# Patient Record
Sex: Female | Born: 1945 | Race: White | Hispanic: No | Marital: Married | Smoking: Never smoker
Health system: Southern US, Community
[De-identification: ages and names within clinical notes are randomized; demographics above are authoritative.]

## PROBLEM LIST (undated history)

## (undated) DIAGNOSIS — G8929 Other chronic pain: Secondary | ICD-10-CM

## (undated) DIAGNOSIS — Z8719 Personal history of other diseases of the digestive system: Secondary | ICD-10-CM

## (undated) DIAGNOSIS — I499 Cardiac arrhythmia, unspecified: Secondary | ICD-10-CM

## (undated) DIAGNOSIS — F419 Anxiety disorder, unspecified: Secondary | ICD-10-CM

## (undated) DIAGNOSIS — I10 Essential (primary) hypertension: Secondary | ICD-10-CM

## (undated) DIAGNOSIS — K219 Gastro-esophageal reflux disease without esophagitis: Secondary | ICD-10-CM

## (undated) DIAGNOSIS — M1990 Unspecified osteoarthritis, unspecified site: Secondary | ICD-10-CM

## (undated) DIAGNOSIS — C44319 Basal cell carcinoma of skin of other parts of face: Secondary | ICD-10-CM

## (undated) DIAGNOSIS — I509 Heart failure, unspecified: Secondary | ICD-10-CM

## (undated) DIAGNOSIS — M545 Low back pain, unspecified: Secondary | ICD-10-CM

## (undated) DIAGNOSIS — G43909 Migraine, unspecified, not intractable, without status migrainosus: Secondary | ICD-10-CM

## (undated) DIAGNOSIS — J189 Pneumonia, unspecified organism: Secondary | ICD-10-CM

## (undated) DIAGNOSIS — I4891 Unspecified atrial fibrillation: Secondary | ICD-10-CM

## (undated) HISTORY — PX: TEMPOROMANDIBULAR JOINT SURGERY: SHX35

## (undated) HISTORY — PX: TUBAL LIGATION: SHX77

## (undated) HISTORY — PX: BASAL CELL CARCINOMA EXCISION: SHX1214

## (undated) HISTORY — PX: JOINT REPLACEMENT: SHX530

## (undated) HISTORY — PX: OOPHORECTOMY: SHX86

## (undated) HISTORY — PX: SHOULDER ARTHROSCOPY W/ ROTATOR CUFF REPAIR: SHX2400

## (undated) HISTORY — PX: MOHS SURGERY: SUR867

## (undated) HISTORY — PX: DILATION AND CURETTAGE OF UTERUS: SHX78

## (undated) HISTORY — PX: TMJ ARTHROPLASTY: SHX1066

---

## 1989-02-19 HISTORY — PX: CHOLECYSTECTOMY: SHX55

## 1989-02-19 HISTORY — PX: VAGINAL HYSTERECTOMY: SUR661

## 2000-06-07 ENCOUNTER — Ambulatory Visit (HOSPITAL_COMMUNITY): Admission: RE | Admit: 2000-06-07 | Discharge: 2000-06-07 | Payer: Self-pay | Admitting: Gastroenterology

## 2000-06-07 ENCOUNTER — Encounter: Payer: Self-pay | Admitting: Gastroenterology

## 2000-11-17 ENCOUNTER — Other Ambulatory Visit: Admission: RE | Admit: 2000-11-17 | Discharge: 2000-11-17 | Payer: Self-pay | Admitting: Obstetrics and Gynecology

## 2001-05-02 ENCOUNTER — Other Ambulatory Visit: Admission: RE | Admit: 2001-05-02 | Discharge: 2001-05-02 | Payer: Self-pay | Admitting: Obstetrics & Gynecology

## 2001-06-15 ENCOUNTER — Encounter (INDEPENDENT_AMBULATORY_CARE_PROVIDER_SITE_OTHER): Payer: Self-pay | Admitting: Specialist

## 2001-06-15 ENCOUNTER — Inpatient Hospital Stay (HOSPITAL_COMMUNITY): Admission: RE | Admit: 2001-06-15 | Discharge: 2001-06-18 | Payer: Self-pay | Admitting: Obstetrics & Gynecology

## 2002-07-11 ENCOUNTER — Other Ambulatory Visit: Admission: RE | Admit: 2002-07-11 | Discharge: 2002-07-11 | Payer: Self-pay | Admitting: Obstetrics & Gynecology

## 2003-07-25 ENCOUNTER — Other Ambulatory Visit: Admission: RE | Admit: 2003-07-25 | Discharge: 2003-07-25 | Payer: Self-pay | Admitting: Obstetrics & Gynecology

## 2004-08-20 ENCOUNTER — Other Ambulatory Visit: Admission: RE | Admit: 2004-08-20 | Discharge: 2004-08-20 | Payer: Self-pay | Admitting: Obstetrics & Gynecology

## 2005-03-30 ENCOUNTER — Ambulatory Visit (HOSPITAL_COMMUNITY): Admission: RE | Admit: 2005-03-30 | Discharge: 2005-03-30 | Payer: Self-pay | Admitting: Gastroenterology

## 2005-09-29 ENCOUNTER — Other Ambulatory Visit: Admission: RE | Admit: 2005-09-29 | Discharge: 2005-09-29 | Payer: Self-pay | Admitting: Obstetrics & Gynecology

## 2007-05-26 ENCOUNTER — Encounter: Admission: RE | Admit: 2007-05-26 | Discharge: 2007-05-26 | Payer: Self-pay | Admitting: Orthopaedic Surgery

## 2007-06-22 HISTORY — PX: TOTAL HIP ARTHROPLASTY: SHX124

## 2008-03-12 ENCOUNTER — Inpatient Hospital Stay (HOSPITAL_COMMUNITY): Admission: RE | Admit: 2008-03-12 | Discharge: 2008-03-19 | Payer: Self-pay | Admitting: Orthopaedic Surgery

## 2008-03-18 ENCOUNTER — Ambulatory Visit: Payer: Self-pay | Admitting: Physical Medicine & Rehabilitation

## 2009-03-28 ENCOUNTER — Ambulatory Visit (HOSPITAL_COMMUNITY): Admission: RE | Admit: 2009-03-28 | Discharge: 2009-03-28 | Payer: Self-pay | Admitting: Orthopaedic Surgery

## 2009-05-22 ENCOUNTER — Encounter: Admission: RE | Admit: 2009-05-22 | Discharge: 2009-05-22 | Payer: Self-pay | Admitting: Orthopaedic Surgery

## 2009-06-21 HISTORY — PX: TOTAL HIP ARTHROPLASTY: SHX124

## 2009-06-21 HISTORY — PX: HERNIA REPAIR: SHX51

## 2009-10-22 ENCOUNTER — Encounter: Admission: RE | Admit: 2009-10-22 | Discharge: 2009-10-22 | Payer: Self-pay | Admitting: Internal Medicine

## 2010-01-29 ENCOUNTER — Inpatient Hospital Stay (HOSPITAL_COMMUNITY): Admission: RE | Admit: 2010-01-29 | Discharge: 2010-02-01 | Payer: Self-pay | Admitting: General Surgery

## 2010-04-07 ENCOUNTER — Inpatient Hospital Stay (HOSPITAL_COMMUNITY): Admission: RE | Admit: 2010-04-07 | Discharge: 2010-04-11 | Payer: Self-pay | Admitting: Orthopaedic Surgery

## 2010-09-02 LAB — CBC
HCT: 26.5 % — ABNORMAL LOW (ref 36.0–46.0)
HCT: 30 % — ABNORMAL LOW (ref 36.0–46.0)
MCH: 30.4 pg (ref 26.0–34.0)
MCHC: 33.6 g/dL (ref 30.0–36.0)
MCV: 91.7 fL (ref 78.0–100.0)
MCV: 91.7 fL (ref 78.0–100.0)
MCV: 91.8 fL (ref 78.0–100.0)
Platelets: 190 10*3/uL (ref 150–400)
RBC: 3.27 MIL/uL — ABNORMAL LOW (ref 3.87–5.11)
RDW: 13.6 % (ref 11.5–15.5)
RDW: 13.9 % (ref 11.5–15.5)
RDW: 14 % (ref 11.5–15.5)
WBC: 6.1 10*3/uL (ref 4.0–10.5)

## 2010-09-02 LAB — BASIC METABOLIC PANEL
BUN: 12 mg/dL (ref 6–23)
BUN: 23 mg/dL (ref 6–23)
CO2: 29 mEq/L (ref 19–32)
CO2: 31 mEq/L (ref 19–32)
Calcium: 8.2 mg/dL — ABNORMAL LOW (ref 8.4–10.5)
Chloride: 100 mEq/L (ref 96–112)
Creatinine, Ser: 0.61 mg/dL (ref 0.4–1.2)
Creatinine, Ser: 0.81 mg/dL (ref 0.4–1.2)
Creatinine, Ser: 1.01 mg/dL (ref 0.4–1.2)
GFR calc non Af Amer: >60 mL/min (ref >60)
Glucose, Bld: 117 mg/dL — ABNORMAL HIGH (ref 70–99)
Glucose, Bld: 119 mg/dL — ABNORMAL HIGH (ref 70–99)
Potassium: 4.1 mEq/L (ref 3.5–5.1)

## 2010-09-02 LAB — PROTIME-INR: INR: 1.94 — ABNORMAL HIGH (ref 0.00–1.49)

## 2010-09-03 LAB — COMPREHENSIVE METABOLIC PANEL
Alkaline Phosphatase: 82 U/L (ref 39–117)
BUN: 24 mg/dL — ABNORMAL HIGH (ref 6–23)
Chloride: 106 mEq/L (ref 96–112)
GFR calc non Af Amer: >60 mL/min (ref >60)
Glucose, Bld: 98 mg/dL (ref 70–99)
Potassium: 4.6 mEq/L (ref 3.5–5.1)
Total Bilirubin: 0.6 mg/dL (ref 0.3–1.2)
Total Protein: 6.9 g/dL (ref 6.0–8.3)

## 2010-09-03 LAB — SURGICAL PCR SCREEN: MRSA, PCR: NEGATIVE

## 2010-09-03 LAB — CBC
HCT: 35.1 % — ABNORMAL LOW (ref 36.0–46.0)
Hemoglobin: 12.2 g/dL (ref 12.0–15.0)
MCHC: 34.8 g/dL (ref 30.0–36.0)

## 2010-09-04 LAB — COMPREHENSIVE METABOLIC PANEL
ALT: 21 U/L (ref 0–35)
AST: 21 U/L (ref 0–37)
Albumin: 4.1 g/dL (ref 3.5–5.2)
Calcium: 9.5 mg/dL (ref 8.4–10.5)
Sodium: 138 mEq/L (ref 135–145)
Total Protein: 7 g/dL (ref 6.0–8.3)

## 2010-09-04 LAB — CBC
MCH: 30.7 pg (ref 26.0–34.0)
MCHC: 34.1 g/dL (ref 30.0–36.0)
Platelets: 276 10*3/uL (ref 150–400)
RBC: 4.07 MIL/uL (ref 3.87–5.11)
RDW: 13.6 % (ref 11.5–15.5)

## 2010-09-04 LAB — DIFFERENTIAL
Eosinophils Relative: 3 % (ref 0–5)
Lymphocytes Relative: 19 % (ref 12–46)
Lymphs Abs: 1.2 10*3/uL (ref 0.7–4.0)
Monocytes Absolute: 0.4 10*3/uL (ref 0.1–1.0)
Neutro Abs: 4.6 10*3/uL (ref 1.7–7.7)

## 2010-11-03 NOTE — Discharge Summary (Signed)
Sierra [name], Sierra [name]                 ACCOUNT NO.:  [id number]   MEDICAL RECORD NO.:  [id number]          PATIENT TYPE:  INP   LOCATION:  5016                         FACILITY:  [facility]   PHYSICIAN:  Lubertha Basque. Dalldorf, M.D.DATE OF BIRTH:  1945-07-01   DATE OF ADMISSION:  03/12/2008  DATE OF DISCHARGE:  03/18/2008                               DISCHARGE SUMMARY   ADMITTING DIAGNOSES:  1. Left hip degenerative joint disease.  2. History of gastroesophageal reflux disease.  3. Hypertension.  4. HPL.   DISCHARGE DIAGNOSES:  1. Left hip degenerative joint disease.  2. History of gastroesophageal reflux disease.  3. Hypertension.  4. HPL.   OPERATIONS:  Left total hip replacement.   BRIEF HISTORY:  [name] is a 65 year old patient well known to our  practice with increasing left hip pain and trouble walking.  X-rays  revealed end-stage DJD.  We have discussed treatment options with her  and I proceeded with a total hip replacement.   PERTINENT LABORATORY AND X-RAY FINDINGS:  Hemoglobin 9.3, hematocrit  26.9, and WBC is 7.4.  Sodium 136, potassium 3.6, glucose 133, BUN 15,  and creatinine 0.75.  INR 2.0.   COURSE IN THE HOSPITAL:  The patient was admitted postoperatively and  placed on a variety of p.o. and IM analgesics for pain, IV Ancef 1 g q.8  h x3 doses used.  She was kept on her home medicines which would be  outlined in the next dictation.  Physical therapy to be touched on the  partial weightbearing and pharmacy for Coumadin, Lovenox, and Protocol.  Her vitals were stable throughout her hospital stay.  Dressing was  changed numerous times and wound was noted to be benign with some  infection or irritation.  Normal neurovascular status but she was  extremely slow working with physical therapy, had weakness, had some  nausea and some vomiting, difficulty keeping her weightbearing status as  touchdown to progress to a partial weightbearing and then she decided  that she should  be able go home.  We had ordered case management for  skilled nursing facility placement.   CONDITION ON DISCHARGE:  Improved.   FOLLOWUP:  We may change her dressing daily.  Should be on a low-sodium  heart-healthy diet, partial weightbearing.  Return to our office in 2  weeks for staple removal, [phone] and should remain on her medicines  which were Ambien 10 mg as needed for sleep; Coumadin for 4 weeks  regulated by pharmacy, INR between 2 and 3; Phenergan for nausea 25 mg  q.4-6 h. P.r.n.; multivitamin 1 a day; and Percocet 1-2 q.4-6 p.r.n. as  needed for pain.      Lindwood Qua, P.A.      [name], M.D.  Electronically Signed    MC/MEDQ  D:  03/17/2008  T:  03/18/2008  Job:  [id number]

## 2010-11-03 NOTE — Op Note (Signed)
NAMENATIVIDAD, SCHLOSSER                 ACCOUNT NO.:  [id number]   MEDICAL RECORD NO.:  [id number]          PATIENT TYPE:  INP   LOCATION:  5016                         FACILITY:  [facility]   PHYSICIAN:  Lubertha Basque. Dalldorf, M.D.DATE OF BIRTH:  12-06-1945   DATE OF PROCEDURE:  03/12/2008  DATE OF DISCHARGE:                               OPERATIVE REPORT   PREOPERATIVE DIAGNOSIS:  Left hip degenerative arthritis.   POSTOPERATIVE DIAGNOSIS:  Left hip degenerative arthritis.   PROCEDURE:  Left total hip replacement.   ANESTHESIA:  General.   ATTENDING SURGEON:  Lubertha Basque. Jerl Santos, MD   ASSISTANT:  Lindwood Qua, PA   INDICATIONS FOR PROCEDURE:  The patient is a 65 year old woman with a  long history of bilateral hip pain.  She has persisted with difficulty  despite oral anti-inflammatories and even injections to both hips.  She  has pain which limits her ability to rest and walk and she is offered a  hip replacement on the left which is her most painful side currently.  Informed operative consent was obtained after discussion of the possible  complications of reaction to anesthesia, infection, DVT, PE,  dislocation, and death.   SUMMARY OF FINDINGS AND PROCEDURE:  Under general anesthesia through a  standard posterior approach, a left total hip replacement was performed.  She had advanced degenerative change but excellent bone quality.  We  addressed her problem with an uncemented DePuy System using a size 2  high offset Summit stem with a 45 ASR head with a +5 neck.  We also used  a size 50 ASR cup.  Bryna Colander assisted throughout and was invaluable  to the completion of the case in that he helped position and retract  while I performed the procedure.  He also closed simultaneously to help  minimize OR time.   DESCRIPTION OF PROCEDURE:  The patient was taken to the operating suite  where general anesthetic was applied without difficulty.  She then  positioned on a lateral decubitus  position with the left hip up.  Hip  positioners were utilized, axillary roll was placed, and all bony  prominences were appropriately padded.  She was prepped and draped in  normal sterile fashion.  After the administration of IV Kefzol, a  posterior approach was taken to the left hip.  An incision was made with  dissection down through a moderate amount of adipose tissue to the IT  band and gluteus maximus fascia which were split longitudinally to  expose the short external rotators of hip which were tagged and  reflected.  All appropriate anti-infected measures were used including  the preoperative IV antibiotic, Betadine impregnated drape, and closed  hooded exhaust systems for each member of the surgical team.  A  posterior capsulectomy was performed and the hip was dislocated.  A  femoral neck cut was made just above the lesser trochanter.  The  acetabulum was fully exposed and some labral tissues were excised.  She  was reamed in a medial direction to the inside wall of the pelvis up to  a size 49 followed  by placement of size 50 ASR cup in appropriate  anteversion and tilt.  Attention was then turned toward the femur.  This  was reamed and was very tight.  We broached up to a size 2 which fit  very well.  Trial reduction was done with various components and she  seemed stable with all but her leg length seemed best with the +5 high  offset assembly.  The trial component was removed followed by placement  of a size 2 high offset Summit stem in appropriate anteversion.  We then  placed a +5 45 ASR head assembly on a dry stem.  The hip was again  reduced and was stable in the aforementioned positions.  The leg lengths  were judged to be roughly equal.  The wound was irrigated followed by  reapproximation of the short external rotators to the greater  trochanteric region with nonabsorbable suture.  IT band and gluteus  maximus fascia were reapproximated with #1 Vicryl in interrupted   fashion.  Subcutaneous reapproximation was carried out with 0 and 2-0  undyed Vicryl followed by skin closure with staples.  Adaptic was  applied followed by dry gauze tape.  Estimated blood loss and  intraoperative fluids were obtained from anesthesia records.   DISPOSITION:  The patient was extubated in the operating room and taken  to recovery room in stable addition.  She is to be admitted to the  Orthopedic Surgery Service for appropriate postop care to include  perioperative antibiotics and Coumadin plus Lovenox for DVT prophylaxis.      [name], M.D.  Electronically Signed     PGD/MEDQ  D:  03/12/2008  T:  03/12/2008  Job:  [id number]

## 2010-11-06 NOTE — Op Note (Signed)
[facility]  Patient:    Sierra [name], Sierra [name] Visit Number: [id number] MRN: [id number]          Service Type: GYN Location: 910A 9131 01 Attending Physician:  Minette Headland Dictated by:   Freddy Finner, M.D. Proc. Date: 06/15/01 Admit Date:  06/15/2001                             Operative Report  PREOPERATIVE DIAGNOSES:       1. Persistent cystic right adnexal mass.                               2. Cystourethrocele with stress urinary                                  incontinence.  POSTOPERATIVE DIAGNOSES:      1. Benign right ovarian cyst.                               2. Pelvic adhesions.                               3. Cystourethrocele with stress urinary                                  incontinence.  OPERATION:                    1. Exploratory laparotomy with bilateral                                  salpingo-oophorectomy and lysis of pelvic                                  adhesions.                               2. Burch suspension of bladder.  SURGEON:                      Freddy Finner, M.D.  ASSISTANT:                    Guy Sandifer. Arleta Creek, M.D.  ESTIMATED BLOOD LOSS:         Less than or equal to 100 cc.  COMPLICATIONS:                None.  INDICATIONS:                  Details of present illness recorded in admit note.  DESCRIPTION OF PROCEDURE:     The patient was admitted on the morning of surgery.  She was placed in PDS hose.  She was given a bolus of Cefotan IV. She was taken to the operating room and there placed under adequate general endotracheal anesthesia and placed in the dorsal recumbent position with the knees frog legged and supporting the knees at the knee  joint.  The abdomen, perineum, and vagina were prepped with Betadine scrub followed by Betadine solution.  Foley with 30 cc bulb and triple lumen was placed in the bladder.  Sterile drapes were applied.  Lower abdominal transverse incision was made  and carried sharply down to fascia.  Subcutaneous vessels were controlled with the Bovie.  The fascia was entered sharply and extended transversely to the extent of skin incision.  Rectus sheath was developed superiorly and inferiorly with blunt and sharp dissection.  Rectus muscles were divided in midline.  Peritoneum was entered and extended under direct visualization to the extent of skin incision.  Peritoneal washings were taken and submitted.  With sharp dissection, some omental adhesions in the pelvis were freed.  Dissection was also carried out with the Bovie to free omental adhesions and to assure hemostasis.  The right tube and ovary were elevated and identified.  The right ovary was cystically enlarge with a smooth external capsule.  There was no evidence of peritoneal stunning.  Tanja Port was used to grasp the tube and ovary, and the infundibulopelvic ligament crossclamped with a Heaney and divided sharply.  This pedicle was doubly ligated, free tied with 0 Monocryl, followed by suture ligature with 0 Monocryl.  The remaining pedicle holding the ovary was crossclamped with a Heany, and the tube and ovary were removed.  The remaining pedicle was free tied with 0 Monocryl. Coagulation between the pedicles was carried out for complete hemostasis.  The left tube and ovary were identified. The left ovary was small and atrophic. It was elevated with a Babcock, and then a single pedicle, the infundibulopelvic and broad ligament pedicle were crossclamped and tube and ovary sharply excised.  The pedicle was doubly ligated with free tie of 0 Monocryl followed by suture ligature of 0 Monocryl. Exploration of the upper abdomen revealed no apparent abnormalities.  The appendix was visualized and was normal.  There were adhesions in the omentum in the right upper quadrant which were not dissected consistent with previous cholecystectomy.  Irrigation was carried out.  Irrigating solution was  aspirated from the abdomen. Hemostasis was adequate.  All packs, needles, and instruments were removed. The retractor used during this procedure was an [place].  Moist packs were used to pack the intestinal contents out of the pelvis.  After removal of packs and instruments, the peritoneum was closed with a running 0 Monocryl Suture.  Space of Retzius was then carefully dissected.  Angle of the ureterovesical junction was identified with traction on 30 cc Foley.  Two sutures were placed, one approximately 1.5 cm lateral to the ureterovesical junction and one 1 cm further lateral.  Each of these was attached to Coopers ligament or periosteum of the symphysis.  This was performed on both sides.  Sutures were tied with adequate elevation of the ureterovesical neck.  Bladder was filled with irrigating solution and a Bonnano catheter placed through a separate stab wound into the bladder and anchored to the skin with 3-0 nylon suture. Bladder was evacuated and Foley catheter removed.  The abdominal incision was then closed with running 0 Monocryl.  Running 0 PDS was used to close the fascia from angle to midline on either side.  Skin was closed with skin staples and 1/4 inch Steri-Strips.  The patient tolerated the operative procedure well and was taken to the recovery room in good condition. Dictated by:   Freddy Finner, M.D. Attending Physician:  Minette Headland DD:  06/15/01 TD:  06/15/01 Job:  [id number]

## 2010-11-06 NOTE — Discharge Summary (Signed)
[facility]  Patient:    Sierra [name], Sierra [name] Visit Number: [id number] MRN: [id number]          Service Type: GYN Location: 910A 9131 01 Attending Physician:  Minette Headland Dictated by:   Freddy Finner, M.D. Admit Date:  06/15/2001 Discharge Date: 06/18/2001                             Discharge Summary  FINAL DIAGNOSIS:              1.  Benign serous cystadenoma of right ovary.                               2.  Benign findings of left ovary and fallopian                                   tube.                               3.  Benign right fallopian tube.                               4.  Cystourethrocele with stress urinary                                   incontinence.  OPERATIVE PROCEDURE:          1. Bilateral salpingo-oophorectomy.                               2. Burch suspension to bladder.  POSTOPERATIVE COMPLICATIONS:  None.  DISPOSITION:                  The patient was in satisfactory improved condition at the time of her discharge.  DISCHARGE MEDICATIONS:        She is to resume all of her preoperative medications listed in the H&P.  She is further given Percocet 5 mg to be taken as needed for postoperative pain.  She is to continue with her hormone replacement therapy.  FOLLOWUP:                     She is to see me in approximately two weeks for her first postoperative visit.  HISTORY OF PRESENT ILLNESS:   Details of the present illness, past history, family history, review of systems and physical examination are recorded in the admission note or the operative summary.  FAMILY HISTORY:               The patients family history is dramatically remarkable for an ovarian malignancy in her mother.  PERSONAL HISTORY:             Remarkable for a persistent ovarian cyst noted to have been present for 18 months and demonstrable on pelvic ultrasound.  PHYSICAL EXAMINATION:         Her physical examination was further  remarkable for a cystourethrocele, with known previous history of anterior/posterior vaginal repair.  PLAN:  The patient has requested surgical intervention and is admitted at this time for bilateral salpingo-oophorectomy and Burch suspension of the bladder.  LABORATORY DATA:              During this admission, normal prothrombin time, PTT, and INR on admission.  Her admission hemoglobin was 137, platelet count was normal.  Postoperative hemoglobin was 11.1, with a normal platelet count.  Admission urinalysis was normal.  HOSPITAL COURSE:              The patient was admitted on the morning of surgery.  She was treated perioperatively with IV antibiotic and ______ .  The above described procedure was accomplished without difficulty.  By the morning of the third postoperative day, the patient was voiding normally without a catheter.  She was having adequate bowel function.  She had remained afebrile throughout her hospital stay.  Her condition was considered to be satisfactory and ready for discharge. Dictated by:   Freddy Finner, M.D. Attending Physician:  Minette Headland DD:  06/27/01 TD:  06/27/01 Job: [id number]

## 2010-11-06 NOTE — Procedures (Signed)
[facility]  Patient:    Sierra [name], Sierra [name]                          MRN: [id number] Proc. Date: 06/07/00 Attending:  Verlin Grills, M.D.                           Procedure Report  PROCEDURE:  Esophagogastroduodenoscopy with Savary dilation.  ENDOSCOPIST:  Verlin Grills, M.D.  INDICATIONS:  [name] is a 65 year old white female with heartburn and solid food dysphagia.  On November 27, 1991, she underwent a barium swallow, upper GI x-ray series to evaluate her heartburn.  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  [name] experiences the sensation of solid food stopping in her mid esophagus.  She reports no odynophagia or weight loss.  [name] reviewed our esophagogastroduodenoscopy education film.  I reviewed with her the complications associated with esophagogastroduodenoscopy and Savary esophageal dilation including intestinal bleeding and intestinal perforation.  [name] has signed the operative permit.  MEDICATION ALLERGIES:  CODEINE and ERYTHROMYCIN.  CHRONIC MEDICATIONS:  1. Toprol XL.  2. Hytrin.  3. Hydrochlorothiazide.  4. Colestid.  5. Aciphex.  6. Estrogen.  7. Benadryl.  8. Multivitamin.  9. Vitamin C. 10. Topical medication for rosacea.  PAST MEDICAL HISTORY:  Chronic gastroesophageal reflux disease, remote cholecystectomy, vaginal hysterectomy with cystocele and rectocele repair. Tubal ligation.  Jaw surgery, foot surgery, hypertension.  ENDOSCOPIST:  Verlin Grills, M.D.  PREMEDICATIONS:  Demerol 100 mg, Versed 10 mg.  ENDOSCOPE:  Olympus gastroscope and the 16 mm Savary dilator.  DESCRIPTION OF PROCEDURE:  After obtaining informed consent, the patient was placed in the left lateral decubitus position on the fluoroscopy table.  I administered intravenous Demerol and intravenous Versed to achieve  conscious sedation for the procedure.  The patients blood pressure, oxygen, saturation and cardiac rhythm were monitored throughout the procedure and documented in the medical record.  The Olympus gastroscope was passed through the posterior hypopharynx into the proximal esophagus without difficulty.  The hypopharynx, pharynx and vocal cords appeared normal.  Esophagoscopy:  The proximal, mid and lower segments of the esophagus appeared normal.  Endoscopically, there is no evidence of the presence of Barretts, erosive esophagitis, esophageal mucosa scarring, esophageal obstruction  or esophageal ulceration.  Gastroscopy:  There is no evidence for hiatal hernia.  Retroflexed view of the gastric cardia and fundus was normal.  Endoscopic appearance of the gastric body, antrum and pylorus was normal.  Duodenoscopy:  The duodenal bulb and descending duodenum appeared normal.  Savary esophageal dilation:  The Savary dilator wire was passed through the endoscope and the guidewire advanced to the distal gastric antrum as confirmed endoscopically and fluoroscopically.  Under fluoroscopic guidance, the 15 mm Savary dilator passed without resistance.  Repeat esophagogastroduodenoscopy revealed no signs of an esophageal mucosal stricture following Savary esophageal dilation, and no evidence of gastric trauma to the guidewire.  ASSESSMENT:  Chronic gastroesophageal reflux disease associated with a normal esophagogastroduodenoscopy. DD:  06/07/00 TD:  06/08/00 Job: [id number]

## 2010-11-06 NOTE — Op Note (Signed)
NAMEMAKENIZE, MESSMAN NO.:  [id number]   MEDICAL RECORD NO.:  [id number]          PATIENT TYPE:  AMB   LOCATION:  ENDO                         FACILITY:  [facility]   PHYSICIAN:  Danise Edge, M.D.   DATE OF BIRTH:  Jan 30, 1946   DATE OF PROCEDURE:  03/30/2005  DATE OF DISCHARGE:                                 OPERATIVE REPORT   PROCEDURE:  Screening proctocolonoscopy to 70 cm.   INDICATIONS:  Ms. Sierra [name] is a 65 year old female born June 10, 1946. Sierra [name] is scheduled to undergo her first screening colonoscopy with  polypectomy to prevent colon cancer. She has undergone a hysterectomy,  bladder tacking surgery and cholecystectomy in the past. As a result of her  pelvic surgery, a complete colonoscopy was not performed due to colonic loop  formation.   ENDOSCOPIST:  Danise Edge, M.D.   PREMEDICATION:  Versed 9 mg, Demerol 70 mg.   DESCRIPTION OF PROCEDURE:  After obtaining informed consent, Sierra [name] was  placed in the left lateral decubitus position. I administered intravenous  Demerol and intravenous Versed to achieve conscious sedation for the  procedure. The patient's blood pressure, oxygen saturation and cardiac  rhythm were monitored throughout the procedure and documented in the medical  record.   Anal inspection and digital rectal exam were normal. The Olympus adjustable  pediatric colonoscope was introduced into the rectum and advanced to 70 cm  from the anal verge which was near the splenic flexure. Due to colonic loop  formation and pelvic adhesions, a complete colonoscopy could not be  performed. The patient was repositioned from the left lateral decubitus  position to the supine position and finally the right lateral decubitus  position in the hopes of completing a colonoscopy but despite repositioning  and applying external abdominal pressure, further advancement of the  colonoscope was not possible. Colonic preparation for the exam  today was  excellent.   RECTUM:  Normal. Retroflexed view of the distal rectum normal.  SIGMOID COLON:  Normal.  DESCENDING COLON:  Normal.   Splenic flexure, transverse colon, hepatic flexure, ascending colon, cecum  and ileocecal valve were not examined.   ASSESSMENT:  Normal flexible proctocolonoscopy to 70 cm. 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           ______________________________  Danise Edge, M.D.     MJ/MEDQ  D:  03/30/2005  T:  03/30/2005  Job:  [id number]   cc:   Theressa Millard, M.D.  Fax: [phone]

## 2011-03-10 ENCOUNTER — Other Ambulatory Visit (HOSPITAL_COMMUNITY): Payer: Self-pay | Admitting: Orthopaedic Surgery

## 2011-03-10 DIAGNOSIS — R531 Weakness: Secondary | ICD-10-CM

## 2011-03-19 ENCOUNTER — Other Ambulatory Visit (HOSPITAL_COMMUNITY): Payer: Self-pay

## 2011-03-19 ENCOUNTER — Ambulatory Visit (HOSPITAL_COMMUNITY)
Admission: RE | Admit: 2011-03-19 | Discharge: 2011-03-19 | Disposition: A | Discharge status: Home / Self Care | Payer: Medicare Other | Source: Ambulatory Visit | Attending: Orthopaedic Surgery | Admitting: Orthopaedic Surgery

## 2011-03-19 DIAGNOSIS — M6740 Ganglion, unspecified site: Secondary | ICD-10-CM | POA: Insufficient documentation

## 2011-03-19 DIAGNOSIS — R531 Weakness: Secondary | ICD-10-CM

## 2011-03-19 DIAGNOSIS — Z96649 Presence of unspecified artificial hip joint: Secondary | ICD-10-CM | POA: Insufficient documentation

## 2011-03-19 DIAGNOSIS — M25559 Pain in unspecified hip: Secondary | ICD-10-CM | POA: Insufficient documentation

## 2011-03-22 LAB — PROTIME-INR
INR: 1
INR: 1.3
INR: 1.8 — ABNORMAL HIGH
INR: 2.4 — ABNORMAL HIGH
Prothrombin Time: 13.7
Prothrombin Time: 16.1 — ABNORMAL HIGH
Prothrombin Time: 19.9 — ABNORMAL HIGH
Prothrombin Time: 21.7 — ABNORMAL HIGH

## 2011-03-22 LAB — CBC
HCT: 26.8 — ABNORMAL LOW
HCT: 29.1 — ABNORMAL LOW
HCT: 30.4 — ABNORMAL LOW
HCT: 38.9
Hemoglobin: 10.1 — ABNORMAL LOW
Hemoglobin: 13.7
Hemoglobin: 9.3 — ABNORMAL LOW
MCHC: 34.6
MCHC: 34.9
MCV: 91.8
MCV: 92.2
MCV: 93.5
Platelets: 161
Platelets: 164
Platelets: 201
RBC: 2.91 — ABNORMAL LOW
RBC: 3.11 — ABNORMAL LOW
RBC: 4.23
RDW: 12.4
RDW: 12.5
RDW: 12.6
RDW: 12.6
WBC: 7.4
WBC: 8.5

## 2011-03-22 LAB — BASIC METABOLIC PANEL
BUN: 24 — ABNORMAL HIGH
CO2: 33 — ABNORMAL HIGH
Calcium: 8.4
Chloride: 101
Creatinine, Ser: 0.71
Glucose, Bld: 125 — ABNORMAL HIGH
Glucose, Bld: 91
Potassium: 4.4
Sodium: 137

## 2011-03-22 LAB — BASIC METABOLIC PANEL WITH GFR
BUN: 15
BUN: 18
CO2: 32
CO2: 32
Calcium: 8.5
Calcium: 8.5
Chloride: 100
Chloride: 97
Creatinine, Ser: 0.61
Creatinine, Ser: 0.75
GFR calc non Af Amer: >60
GFR calc non Af Amer: >60
Glucose, Bld: 113 — ABNORMAL HIGH
Glucose, Bld: 133 — ABNORMAL HIGH
Potassium: 3.6
Potassium: 4.5
Sodium: 136
Sodium: 139

## 2011-09-10 DIAGNOSIS — I10 Essential (primary) hypertension: Secondary | ICD-10-CM | POA: Diagnosis not present

## 2011-09-10 DIAGNOSIS — Z1331 Encounter for screening for depression: Secondary | ICD-10-CM | POA: Diagnosis not present

## 2011-09-10 DIAGNOSIS — R1013 Epigastric pain: Secondary | ICD-10-CM | POA: Diagnosis not present

## 2011-09-10 DIAGNOSIS — R490 Dysphonia: Secondary | ICD-10-CM | POA: Diagnosis not present

## 2011-09-27 DIAGNOSIS — R490 Dysphonia: Secondary | ICD-10-CM | POA: Diagnosis not present

## 2011-09-27 DIAGNOSIS — K219 Gastro-esophageal reflux disease without esophagitis: Secondary | ICD-10-CM | POA: Diagnosis not present

## 2011-09-29 DIAGNOSIS — M722 Plantar fascial fibromatosis: Secondary | ICD-10-CM | POA: Diagnosis not present

## 2011-09-29 DIAGNOSIS — R490 Dysphonia: Secondary | ICD-10-CM | POA: Diagnosis not present

## 2011-09-29 DIAGNOSIS — M25559 Pain in unspecified hip: Secondary | ICD-10-CM | POA: Diagnosis not present

## 2011-10-07 DIAGNOSIS — H2510 Age-related nuclear cataract, unspecified eye: Secondary | ICD-10-CM | POA: Diagnosis not present

## 2011-10-22 DIAGNOSIS — L820 Inflamed seborrheic keratosis: Secondary | ICD-10-CM | POA: Diagnosis not present

## 2011-10-27 DIAGNOSIS — M722 Plantar fascial fibromatosis: Secondary | ICD-10-CM | POA: Diagnosis not present

## 2011-10-29 DIAGNOSIS — M47817 Spondylosis without myelopathy or radiculopathy, lumbosacral region: Secondary | ICD-10-CM | POA: Diagnosis not present

## 2011-11-08 DIAGNOSIS — Z1231 Encounter for screening mammogram for malignant neoplasm of breast: Secondary | ICD-10-CM | POA: Diagnosis not present

## 2011-11-09 DIAGNOSIS — M47817 Spondylosis without myelopathy or radiculopathy, lumbosacral region: Secondary | ICD-10-CM | POA: Diagnosis not present

## 2011-11-22 DIAGNOSIS — M722 Plantar fascial fibromatosis: Secondary | ICD-10-CM | POA: Diagnosis not present

## 2011-12-01 DIAGNOSIS — M47817 Spondylosis without myelopathy or radiculopathy, lumbosacral region: Secondary | ICD-10-CM | POA: Diagnosis not present

## 2012-01-12 DIAGNOSIS — R109 Unspecified abdominal pain: Secondary | ICD-10-CM | POA: Diagnosis not present

## 2012-01-12 DIAGNOSIS — K219 Gastro-esophageal reflux disease without esophagitis: Secondary | ICD-10-CM | POA: Diagnosis not present

## 2012-02-09 DIAGNOSIS — M25559 Pain in unspecified hip: Secondary | ICD-10-CM | POA: Diagnosis not present

## 2012-02-09 DIAGNOSIS — M722 Plantar fascial fibromatosis: Secondary | ICD-10-CM | POA: Diagnosis not present

## 2012-03-14 DIAGNOSIS — E7800 Pure hypercholesterolemia, unspecified: Secondary | ICD-10-CM | POA: Diagnosis not present

## 2012-03-14 DIAGNOSIS — K219 Gastro-esophageal reflux disease without esophagitis: Secondary | ICD-10-CM | POA: Diagnosis not present

## 2012-03-14 DIAGNOSIS — R7301 Impaired fasting glucose: Secondary | ICD-10-CM | POA: Diagnosis not present

## 2012-03-14 DIAGNOSIS — I10 Essential (primary) hypertension: Secondary | ICD-10-CM | POA: Diagnosis not present

## 2012-03-14 DIAGNOSIS — Z23 Encounter for immunization: Secondary | ICD-10-CM | POA: Diagnosis not present

## 2012-03-30 ENCOUNTER — Other Ambulatory Visit (HOSPITAL_COMMUNITY): Payer: Self-pay | Admitting: Orthopaedic Surgery

## 2012-03-30 DIAGNOSIS — Z96649 Presence of unspecified artificial hip joint: Secondary | ICD-10-CM

## 2012-04-11 DIAGNOSIS — M47817 Spondylosis without myelopathy or radiculopathy, lumbosacral region: Secondary | ICD-10-CM | POA: Diagnosis not present

## 2012-04-12 ENCOUNTER — Ambulatory Visit (HOSPITAL_COMMUNITY)
Admission: RE | Admit: 2012-04-12 | Discharge: 2012-04-12 | Payer: Medicare Other | Source: Ambulatory Visit | Attending: Orthopaedic Surgery | Admitting: Orthopaedic Surgery

## 2012-04-18 DIAGNOSIS — M47817 Spondylosis without myelopathy or radiculopathy, lumbosacral region: Secondary | ICD-10-CM | POA: Diagnosis not present

## 2012-05-05 DIAGNOSIS — M47817 Spondylosis without myelopathy or radiculopathy, lumbosacral region: Secondary | ICD-10-CM | POA: Diagnosis not present

## 2012-05-08 ENCOUNTER — Ambulatory Visit (HOSPITAL_COMMUNITY)
Admission: RE | Admit: 2012-05-08 | Discharge: 2012-05-08 | Disposition: A | Discharge status: Home / Self Care | Payer: Medicare Other | Source: Ambulatory Visit | Attending: Orthopaedic Surgery | Admitting: Orthopaedic Surgery

## 2012-05-08 DIAGNOSIS — Z96649 Presence of unspecified artificial hip joint: Secondary | ICD-10-CM | POA: Diagnosis not present

## 2012-05-08 DIAGNOSIS — Z471 Aftercare following joint replacement surgery: Secondary | ICD-10-CM | POA: Diagnosis not present

## 2012-05-17 DIAGNOSIS — M25559 Pain in unspecified hip: Secondary | ICD-10-CM | POA: Diagnosis not present

## 2012-05-17 DIAGNOSIS — M722 Plantar fascial fibromatosis: Secondary | ICD-10-CM | POA: Diagnosis not present

## 2012-05-23 DIAGNOSIS — M47817 Spondylosis without myelopathy or radiculopathy, lumbosacral region: Secondary | ICD-10-CM | POA: Diagnosis not present

## 2012-05-24 DIAGNOSIS — H109 Unspecified conjunctivitis: Secondary | ICD-10-CM | POA: Diagnosis not present

## 2012-05-24 DIAGNOSIS — H2510 Age-related nuclear cataract, unspecified eye: Secondary | ICD-10-CM | POA: Diagnosis not present

## 2012-06-06 DIAGNOSIS — M47817 Spondylosis without myelopathy or radiculopathy, lumbosacral region: Secondary | ICD-10-CM | POA: Diagnosis not present

## 2012-06-30 DIAGNOSIS — M545 Low back pain: Secondary | ICD-10-CM | POA: Diagnosis not present

## 2012-09-12 DIAGNOSIS — R7301 Impaired fasting glucose: Secondary | ICD-10-CM | POA: Diagnosis not present

## 2012-09-12 DIAGNOSIS — F411 Generalized anxiety disorder: Secondary | ICD-10-CM | POA: Diagnosis not present

## 2012-09-12 DIAGNOSIS — K219 Gastro-esophageal reflux disease without esophagitis: Secondary | ICD-10-CM | POA: Diagnosis not present

## 2012-09-12 DIAGNOSIS — I10 Essential (primary) hypertension: Secondary | ICD-10-CM | POA: Diagnosis not present

## 2012-12-14 DIAGNOSIS — I10 Essential (primary) hypertension: Secondary | ICD-10-CM | POA: Diagnosis not present

## 2012-12-14 DIAGNOSIS — F411 Generalized anxiety disorder: Secondary | ICD-10-CM | POA: Diagnosis not present

## 2012-12-20 DIAGNOSIS — L819 Disorder of pigmentation, unspecified: Secondary | ICD-10-CM | POA: Diagnosis not present

## 2012-12-20 DIAGNOSIS — D1801 Hemangioma of skin and subcutaneous tissue: Secondary | ICD-10-CM | POA: Diagnosis not present

## 2012-12-20 DIAGNOSIS — D235 Other benign neoplasm of skin of trunk: Secondary | ICD-10-CM | POA: Diagnosis not present

## 2013-03-01 DIAGNOSIS — F411 Generalized anxiety disorder: Secondary | ICD-10-CM | POA: Diagnosis not present

## 2013-03-01 DIAGNOSIS — I10 Essential (primary) hypertension: Secondary | ICD-10-CM | POA: Diagnosis not present

## 2013-03-01 DIAGNOSIS — L659 Nonscarring hair loss, unspecified: Secondary | ICD-10-CM | POA: Diagnosis not present

## 2013-03-07 DIAGNOSIS — Z124 Encounter for screening for malignant neoplasm of cervix: Secondary | ICD-10-CM | POA: Diagnosis not present

## 2013-03-07 DIAGNOSIS — Z01419 Encounter for gynecological examination (general) (routine) without abnormal findings: Secondary | ICD-10-CM | POA: Diagnosis not present

## 2013-03-07 DIAGNOSIS — N390 Urinary tract infection, site not specified: Secondary | ICD-10-CM | POA: Diagnosis not present

## 2013-03-23 DIAGNOSIS — Z1231 Encounter for screening mammogram for malignant neoplasm of breast: Secondary | ICD-10-CM | POA: Diagnosis not present

## 2013-04-23 DIAGNOSIS — Z23 Encounter for immunization: Secondary | ICD-10-CM | POA: Diagnosis not present

## 2013-04-25 DIAGNOSIS — L570 Actinic keratosis: Secondary | ICD-10-CM | POA: Diagnosis not present

## 2013-04-25 DIAGNOSIS — D485 Neoplasm of uncertain behavior of skin: Secondary | ICD-10-CM | POA: Diagnosis not present

## 2013-04-25 DIAGNOSIS — L259 Unspecified contact dermatitis, unspecified cause: Secondary | ICD-10-CM | POA: Diagnosis not present

## 2013-05-28 DIAGNOSIS — M25559 Pain in unspecified hip: Secondary | ICD-10-CM | POA: Diagnosis not present

## 2013-05-31 ENCOUNTER — Other Ambulatory Visit (HOSPITAL_COMMUNITY): Payer: Self-pay | Admitting: Orthopaedic Surgery

## 2013-05-31 DIAGNOSIS — M25552 Pain in left hip: Secondary | ICD-10-CM

## 2013-06-04 ENCOUNTER — Ambulatory Visit (HOSPITAL_COMMUNITY)
Admission: RE | Admit: 2013-06-04 | Discharge: 2013-06-04 | Disposition: A | Discharge status: Home / Self Care | Payer: Medicare Other | Source: Ambulatory Visit | Attending: Orthopaedic Surgery | Admitting: Orthopaedic Surgery

## 2013-06-04 DIAGNOSIS — M25552 Pain in left hip: Secondary | ICD-10-CM

## 2013-06-04 DIAGNOSIS — M25559 Pain in unspecified hip: Secondary | ICD-10-CM | POA: Diagnosis not present

## 2013-06-04 DIAGNOSIS — M799 Soft tissue disorder, unspecified: Secondary | ICD-10-CM | POA: Diagnosis not present

## 2013-06-06 DIAGNOSIS — M545 Low back pain: Secondary | ICD-10-CM | POA: Diagnosis not present

## 2013-06-06 DIAGNOSIS — M25559 Pain in unspecified hip: Secondary | ICD-10-CM | POA: Diagnosis not present

## 2013-06-15 DIAGNOSIS — M47817 Spondylosis without myelopathy or radiculopathy, lumbosacral region: Secondary | ICD-10-CM | POA: Diagnosis not present

## 2013-06-26 DIAGNOSIS — M25559 Pain in unspecified hip: Secondary | ICD-10-CM | POA: Diagnosis not present

## 2013-07-02 DIAGNOSIS — I10 Essential (primary) hypertension: Secondary | ICD-10-CM | POA: Diagnosis not present

## 2013-08-24 DIAGNOSIS — K141 Geographic tongue: Secondary | ICD-10-CM | POA: Diagnosis not present

## 2013-09-07 ENCOUNTER — Encounter (HOSPITAL_COMMUNITY): Payer: Self-pay

## 2013-09-10 ENCOUNTER — Other Ambulatory Visit: Payer: Self-pay | Admitting: Orthopedic Surgery

## 2013-09-11 ENCOUNTER — Ambulatory Visit (HOSPITAL_COMMUNITY)
Admission: RE | Admit: 2013-09-11 | Discharge: 2013-09-11 | Disposition: A | Discharge status: Home / Self Care | Payer: Medicare Other | Source: Ambulatory Visit | Attending: Orthopedic Surgery | Admitting: Orthopedic Surgery

## 2013-09-11 ENCOUNTER — Encounter (HOSPITAL_COMMUNITY): Payer: Self-pay

## 2013-09-11 ENCOUNTER — Encounter (HOSPITAL_COMMUNITY)
Admission: RE | Admit: 2013-09-11 | Discharge: 2013-09-11 | Disposition: A | Discharge status: Home / Self Care | Payer: Medicare Other | Source: Ambulatory Visit | Attending: Orthopedic Surgery | Admitting: Orthopedic Surgery

## 2013-09-11 DIAGNOSIS — Z01818 Encounter for other preprocedural examination: Secondary | ICD-10-CM | POA: Insufficient documentation

## 2013-09-11 DIAGNOSIS — I10 Essential (primary) hypertension: Secondary | ICD-10-CM | POA: Diagnosis not present

## 2013-09-11 HISTORY — DX: Gastro-esophageal reflux disease without esophagitis: K21.9

## 2013-09-11 HISTORY — DX: Personal history of other diseases of the digestive system: Z87.19

## 2013-09-11 HISTORY — DX: Unspecified osteoarthritis, unspecified site: M19.90

## 2013-09-11 HISTORY — DX: Essential (primary) hypertension: I10

## 2013-09-11 HISTORY — DX: Anxiety disorder, unspecified: F41.9

## 2013-09-11 LAB — URINALYSIS, ROUTINE W REFLEX MICROSCOPIC
BILIRUBIN URINE: NEGATIVE
Glucose, UA: NEGATIVE mg/dL
KETONES UR: NEGATIVE mg/dL
NITRITE: NEGATIVE
PROTEIN: NEGATIVE mg/dL
Specific Gravity, Urine: 1.023 (ref 1.005–1.030)
Urobilinogen, UA: 0.2 mg/dL (ref 0.0–1.0)
pH: 5 (ref 5.0–8.0)

## 2013-09-11 LAB — BASIC METABOLIC PANEL
BUN: 31 mg/dL — ABNORMAL HIGH (ref 6–23)
CO2: 27 mEq/L (ref 19–32)
Calcium: 9.8 mg/dL (ref 8.4–10.5)
Chloride: 102 mEq/L (ref 96–112)
Creatinine, Ser: 0.7 mg/dL (ref 0.50–1.10)
GFR, EST NON AFRICAN AMERICAN: 86 mL/min — AB (ref >90)
GLUCOSE: 103 mg/dL — AB (ref 70–99)
POTASSIUM: 3.9 meq/L (ref 3.7–5.3)
Sodium: 142 mEq/L (ref 137–147)

## 2013-09-11 LAB — CBC WITH DIFFERENTIAL/PLATELET
Basophils Absolute: 0.1 10*3/uL (ref 0.0–0.1)
Basophils Relative: 1 % (ref 0–1)
Eosinophils Absolute: 0.3 10*3/uL (ref 0.0–0.7)
Eosinophils Relative: 5 % (ref 0–5)
HCT: 38.4 % (ref 36.0–46.0)
HEMOGLOBIN: 13.1 g/dL (ref 12.0–15.0)
LYMPHS PCT: 15 % (ref 12–46)
Lymphs Abs: 1 10*3/uL (ref 0.7–4.0)
MCH: 31.1 pg (ref 26.0–34.0)
MCHC: 34.1 g/dL (ref 30.0–36.0)
MCV: 91.2 fL (ref 78.0–100.0)
MONOS PCT: 6 % (ref 3–12)
Monocytes Absolute: 0.4 10*3/uL (ref 0.1–1.0)
NEUTROS ABS: 4.5 10*3/uL (ref 1.7–7.7)
NEUTROS PCT: 73 % (ref 43–77)
PLATELETS: 251 10*3/uL (ref 150–400)
RBC: 4.21 MIL/uL (ref 3.87–5.11)
RDW: 13.6 % (ref 11.5–15.5)
WBC: 6.2 10*3/uL (ref 4.0–10.5)

## 2013-09-11 LAB — SURGICAL PCR SCREEN
MRSA, PCR: NEGATIVE
Staphylococcus aureus: NEGATIVE

## 2013-09-11 LAB — URINE MICROSCOPIC-ADD ON

## 2013-09-11 LAB — TYPE AND SCREEN
ABO/RH(D): A POS
Antibody Screen: NEGATIVE

## 2013-09-11 LAB — PROTIME-INR
INR: 0.96 (ref 0.00–1.49)
Prothrombin Time: 12.6 seconds (ref 11.6–15.2)

## 2013-09-11 LAB — APTT: APTT: 25 s (ref 24–37)

## 2013-09-11 LAB — ABO/RH: ABO/RH(D): A POS

## 2013-09-11 MED ORDER — CHLORHEXIDINE GLUCONATE 4 % EX LIQD: 60.0000 mL | Freq: Once | CUTANEOUS | Status: DC

## 2013-09-11 NOTE — Pre-Procedure Instructions (Signed)
Sierra [name]  09/11/2013   Your procedure is scheduled on:  Wednesday April 1 th at 1245 PM  Report to [facility] Short Stay Main Entrance"A" at 70 AM.  Call this number if you have problems the morning of surgery: [phone]   Remember:   Do not eat food or drink liquids after midnight Tuesday.   Take these medicines the morning of surgery with A SIP OF WATER: Citalopram, Gabapentin, Aciphex, and Amlodipine. Takes Metoprolol at night bedtime. Stop Aspirin, Nsaids,  Vitamins and Herbal medication 5 days prior to surgery.   Do not wear jewelry, make-up or nail polish.  Do not wear lotions, powders, or perfumes. You may not wear deodorant.  Do not shave 48 hours prior to surgery.   Do not bring valuables to the hospital.  [facility] is not responsible for any belongings or valuables.               Contacts, dentures or bridgework may not be worn into surgery.  Leave suitcase in the car. After surgery it may be brought to your room.  For patients admitted to the hospital, discharge time is determined by your treatment team.               Patients discharged the day of surgery will not be allowed to drive home.    Special Instructions: [name] - Preparing for Surgery  Before surgery, you can play an important role.  Because skin is not sterile, your skin needs to be as free of germs as possible.  You can reduce the number of germs on you skin by washing with CHG (chlorahexidine gluconate) soap before surgery.  CHG is an antiseptic cleaner which kills germs and bonds with the skin to continue killing germs even after washing.  Please DO NOT use if you have an allergy to CHG or antibacterial soaps.  If your skin becomes reddened/irritated stop using the CHG and inform your nurse when you arrive at Short Stay.  Do not shave (including legs and underarms) for at least 48 hours prior to the first CHG shower.  You may shave your face.  Please follow these instructions  carefully:   1.  Shower with CHG Soap the night before surgery and the                                morning of Surgery.  2.  If you choose to wash your hair, wash your hair first as usual with your       normal shampoo.  3.  After you shampoo, rinse your hair and body thoroughly to remove the                      Shampoo.  4.  Use CHG as you would any other liquid soap.  You can apply chg directly       to the skin and wash gently with scrungie or a clean washcloth.  5.  Apply the CHG Soap to your body ONLY FROM THE NECK DOWN.        Do not use on open wounds or open sores.  Avoid contact with your eyes,       ears, mouth and genitals (private parts).  Wash genitals (private parts)       with your normal soap.  6.  Wash thoroughly, paying special attention to the area where your surgery  will be performed.  7.  Thoroughly rinse your body with warm water from the neck down.  8.  DO NOT shower/wash with your normal soap after using and rinsing off       the CHG Soap.  9.  Pat yourself dry with a clean towel.            10.  Wear clean pajamas.            11.  Place clean sheets on your bed the night of your first shower and do not        sleep with pets.  Day of Surgery  Do not apply any lotions/deoderants the morning of surgery.  Please wear clean clothes to the hospital/surgery center.      Please read over the following fact sheets that you were given: Pain Booklet, Coughing and Deep Breathing, Blood Transfusion Information, MRSA Information and Surgical Site Infection Prevention

## 2013-09-13 NOTE — Progress Notes (Signed)
Anesthesia Chart Review:  Patient is a 68 year old female scheduled for revision of left THA on 09/19/13 by Dr. Mayer Camel.  History includes non-smoker, HTN, hiatal hernia, GERD, anxiety, skin cancer (BCC), migraines, TMJ arthroplasty, UHR, cholecystectomy, hysterectomy. PCP is listed as Dr. Nehemiah Settle.  EKG on 09/11/13 showed NSR, non-specific ST/T wave abnormality.    Preoperative CXR and labs noted. Voice message left with Sandi Raveling regarding abnormal UA results so she can have Dr. Mayer Camel or his PA review for pre-operative recommendations.  George Hugh [facility] Short Stay Center/Anesthesiology Phone [phone] 09/13/2013 11:08 AM

## 2013-09-18 MED ORDER — CEFAZOLIN SODIUM-DEXTROSE 2-3 GM-% IV SOLR
2.0000 g | INTRAVENOUS | Status: AC
  Administered 2013-09-19: 2 g via INTRAVENOUS
  Filled 2013-09-18: qty 50

## 2013-09-18 MED ORDER — DEXTROSE-NACL 5-0.45 % IV SOLN: INTRAVENOUS | Status: DC

## 2013-09-18 NOTE — H&P (Signed)
TOTAL HIP REVISION ADMISSION H&P  Patient is admitted for left revision total hip arthroplasty.  Subjective:  Chief Complaint: left hip pain  HPI: Sierra [name], 68 y.o. female, has a history of pain and functional disability in the left hip due to arthritis and patient has failed non-surgical conservative treatments for greater than 12 weeks to include NSAID's and/or analgesics, flexibility and strengthening excercises and activity modification. The indications for the revision total hip arthroplasty are bearing surface wear leading to  symptomatic synovitis.  Onset of symptoms was gradual starting 6 years ago with gradually worsening course since that time.  Prior procedures on the left hip include arthroplasty.  Patient currently rates pain in the left hip at 10 out of 10 with activity.  There is night pain, worsening of pain with activity and weight bearing, pain that interfers with activities of daily living and pain with passive range of motion. Patient has evidence of soft tissue mass by imaging studies.  This condition presents safety issues increasing the risk of falls.   There is no current active infection.  There are no active problems to display for this patient.  Past Medical History  Diagnosis Date  . Hypertension   . Anxiety   . GERD (gastroesophageal reflux disease)   . H/O hiatal hernia   . Arthritis   . Headache(784.0)     hx migraines  . Cancer     basal cell Ca left cheek    Past Surgical History  Procedure Laterality Date  . Abdominal hysterectomy  1990's  . Dilation and curettage of uterus    . Cholecystectomy  1990's  . Hernia repair  7829    umbilical  . Shoulder arthroscopy w/ rotator cuff repair Right   . Tmj arthroplasty    . Oophorectomy Bilateral   . Joint replacement Right 2009  . Joint replacement Left 2011    No prescriptions prior to admission   Allergies  Allergen Reactions  . Ace Inhibitors Cough  . Codeine Nausea Only  . Erythromycin     Stomach pain  . Iohexol      Code: HIVES, Desc: patient states develops hives w/ iv contrast/mms     History  Substance Use Topics  . Smoking status: Never Smoker   . Smokeless tobacco: Never Used  . Alcohol Use: Yes     Comment: occasionally    No family history on file.    Review of Systems  Constitutional: Negative.   HENT: Negative.   Eyes: Negative.   Respiratory: Negative.   Cardiovascular: Negative.   Gastrointestinal: Negative.   Genitourinary: Negative.   Musculoskeletal: Positive for joint pain.  Skin: Negative.   Neurological: Negative.   Endo/Heme/Allergies: Negative.   Psychiatric/Behavioral: Negative.     Objective:  Physical Exam  Constitutional: She is oriented to person, place, and time. She appears well-developed and well-nourished.  HENT:  Head: Normocephalic and atraumatic.  Eyes: Pupils are equal, round, and reactive to light.  Neck: Normal range of motion. Neck supple.  Cardiovascular: Intact distal pulses.   Respiratory: Effort normal.  Musculoskeletal: She exhibits tenderness.  Neurological: She is alert and oriented to person, place, and time.  Skin: Skin is warm and dry.  Psychiatric: She has a normal mood and affect. Her behavior is normal. Judgment and thought content normal.    Vital signs in last 24 hours:     Labs:   There is no height or weight on file to calculate BMI.  Imaging Review:  The most recent MRI scan is reviewed and does show what appears to be a soft tissue mass around the neck of the total hip stem slightly bigger than last year, extending out for about 1/2 cm around the neck.  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  The patient history, physical examination, clinical judgement of the provider and imaging studies are consistent with end stage degenerative joint disease of the left hip(s), previous total hip arthroplasty. Revision total hip  arthroplasty is deemed medically necessary. The treatment options including medical management, injection therapy, arthroscopy and arthroplasty were discussed at length. The risks and benefits of total hip arthroplasty were presented and reviewed. The risks due to aseptic loosening, infection, stiffness, dislocation/subluxation,  thromboembolic complications and other imponderables were discussed.  The patient acknowledged the explanation, agreed to proceed with the plan and consent was signed. Patient is being admitted for inpatient treatment for surgery, pain control, PT, OT, prophylactic antibiotics, VTE prophylaxis, progressive ambulation and ADL's and discharge planning. The patient is planning to be discharged to skilled nursing facility

## 2013-09-19 ENCOUNTER — Inpatient Hospital Stay (HOSPITAL_COMMUNITY): Payer: Medicare Other | Admitting: Anesthesiology

## 2013-09-19 ENCOUNTER — Inpatient Hospital Stay (HOSPITAL_COMMUNITY): Payer: Medicare Other

## 2013-09-19 ENCOUNTER — Encounter (HOSPITAL_COMMUNITY)
Admission: RE | Disposition: A | Discharge status: Home Health | Payer: Self-pay | Source: Ambulatory Visit | Attending: Orthopedic Surgery

## 2013-09-19 ENCOUNTER — Inpatient Hospital Stay (HOSPITAL_COMMUNITY)
Admission: RE | Admit: 2013-09-19 | Discharge: 2013-09-23 | DRG: 468 | Disposition: A | Discharge status: Home Health | Payer: Medicare Other | Source: Ambulatory Visit | Attending: Orthopedic Surgery | Admitting: Orthopedic Surgery

## 2013-09-19 ENCOUNTER — Encounter (HOSPITAL_COMMUNITY): Payer: Self-pay | Admitting: *Deleted

## 2013-09-19 ENCOUNTER — Encounter (HOSPITAL_COMMUNITY): Payer: Medicare Other | Admitting: Vascular Surgery

## 2013-09-19 DIAGNOSIS — R4182 Altered mental status, unspecified: Secondary | ICD-10-CM

## 2013-09-19 DIAGNOSIS — Y831 Surgical operation with implant of artificial internal device as the cause of abnormal reaction of the patient, or of later complication, without mention of misadventure at the time of the procedure: Secondary | ICD-10-CM | POA: Diagnosis present

## 2013-09-19 DIAGNOSIS — Z96649 Presence of unspecified artificial hip joint: Secondary | ICD-10-CM

## 2013-09-19 DIAGNOSIS — R531 Weakness: Secondary | ICD-10-CM

## 2013-09-19 DIAGNOSIS — K219 Gastro-esophageal reflux disease without esophagitis: Secondary | ICD-10-CM | POA: Diagnosis not present

## 2013-09-19 DIAGNOSIS — Z471 Aftercare following joint replacement surgery: Secondary | ICD-10-CM | POA: Diagnosis not present

## 2013-09-19 DIAGNOSIS — T84099A Other mechanical complication of unspecified internal joint prosthesis, initial encounter: Secondary | ICD-10-CM | POA: Diagnosis not present

## 2013-09-19 DIAGNOSIS — R5381 Other malaise: Secondary | ICD-10-CM | POA: Diagnosis not present

## 2013-09-19 DIAGNOSIS — I10 Essential (primary) hypertension: Secondary | ICD-10-CM | POA: Diagnosis present

## 2013-09-19 DIAGNOSIS — T8489XA Other specified complication of internal orthopedic prosthetic devices, implants and grafts, initial encounter: Secondary | ICD-10-CM | POA: Diagnosis not present

## 2013-09-19 DIAGNOSIS — M25559 Pain in unspecified hip: Secondary | ICD-10-CM | POA: Diagnosis not present

## 2013-09-19 DIAGNOSIS — T40605A Adverse effect of unspecified narcotics, initial encounter: Secondary | ICD-10-CM | POA: Diagnosis present

## 2013-09-19 DIAGNOSIS — Z79899 Other long term (current) drug therapy: Secondary | ICD-10-CM

## 2013-09-19 DIAGNOSIS — Z85828 Personal history of other malignant neoplasm of skin: Secondary | ICD-10-CM | POA: Diagnosis not present

## 2013-09-19 DIAGNOSIS — M25552 Pain in left hip: Secondary | ICD-10-CM

## 2013-09-19 DIAGNOSIS — T8484XA Pain due to internal orthopedic prosthetic devices, implants and grafts, initial encounter: Secondary | ICD-10-CM

## 2013-09-19 DIAGNOSIS — F411 Generalized anxiety disorder: Secondary | ICD-10-CM | POA: Diagnosis not present

## 2013-09-19 DIAGNOSIS — Z7982 Long term (current) use of aspirin: Secondary | ICD-10-CM

## 2013-09-19 DIAGNOSIS — R5383 Other fatigue: Secondary | ICD-10-CM | POA: Diagnosis not present

## 2013-09-19 HISTORY — DX: Migraine, unspecified, not intractable, without status migrainosus: G43.909

## 2013-09-19 HISTORY — DX: Basal cell carcinoma of skin of other parts of face: C44.319

## 2013-09-19 HISTORY — PX: TOTAL HIP REVISION: SHX763

## 2013-09-19 HISTORY — DX: Other chronic pain: G89.29

## 2013-09-19 HISTORY — PX: REVISION TOTAL HIP ARTHROPLASTY: SHX766

## 2013-09-19 HISTORY — DX: Pneumonia, unspecified organism: J18.9

## 2013-09-19 HISTORY — DX: Low back pain: M54.5

## 2013-09-19 HISTORY — DX: Low back pain, unspecified: M54.50

## 2013-09-19 SURGERY — TOTAL HIP REVISION
Anesthesia: General | Site: Hip | Laterality: Left

## 2013-09-19 MED ORDER — FENTANYL CITRATE 0.05 MG/ML IJ SOLN
25.0000 ug | INTRAMUSCULAR | Status: DC | PRN
  Administered 2013-09-19: 50 ug via INTRAVENOUS
  Administered 2013-09-19 (×2): 25 ug via INTRAVENOUS

## 2013-09-19 MED ORDER — ACETAMINOPHEN 325 MG PO TABS
650.0000 mg | ORAL_TABLET | Freq: Four times a day (QID) | ORAL | Status: DC | PRN
  Administered 2013-09-20 – 2013-09-22 (×3): 650 mg via ORAL
  Filled 2013-09-19 (×3): qty 2

## 2013-09-19 MED ORDER — FENTANYL CITRATE 0.05 MG/ML IJ SOLN
INTRAMUSCULAR | Status: AC
  Filled 2013-09-19: qty 5

## 2013-09-19 MED ORDER — TERAZOSIN HCL 5 MG PO CAPS
10.0000 mg | ORAL_CAPSULE | Freq: Every day | ORAL | Status: DC
  Administered 2013-09-19 – 2013-09-22 (×3): 10 mg via ORAL
  Filled 2013-09-19 (×5): qty 2

## 2013-09-19 MED ORDER — PROPOFOL 10 MG/ML IV BOLUS
INTRAVENOUS | Status: AC
  Filled 2013-09-19: qty 20

## 2013-09-19 MED ORDER — PROPOFOL 10 MG/ML IV BOLUS
INTRAVENOUS | Status: DC | PRN
  Administered 2013-09-19: 130 mg via INTRAVENOUS

## 2013-09-19 MED ORDER — CITALOPRAM HYDROBROMIDE 40 MG PO TABS
40.0000 mg | ORAL_TABLET | Freq: Every day | ORAL | Status: DC
  Administered 2013-09-20 – 2013-09-23 (×4): 40 mg via ORAL
  Filled 2013-09-19 (×4): qty 1

## 2013-09-19 MED ORDER — ONDANSETRON HCL 4 MG/2ML IJ SOLN: 4.0000 mg | Freq: Once | INTRAMUSCULAR | Status: DC | PRN

## 2013-09-19 MED ORDER — ONDANSETRON HCL 4 MG/2ML IJ SOLN
INTRAMUSCULAR | Status: AC
  Filled 2013-09-19: qty 2

## 2013-09-19 MED ORDER — DEXAMETHASONE SODIUM PHOSPHATE 4 MG/ML IJ SOLN
INTRAMUSCULAR | Status: DC | PRN
  Administered 2013-09-19: 4 mg via INTRAVENOUS

## 2013-09-19 MED ORDER — PHENOL 1.4 % MT LIQD: 1.0000 | OROMUCOSAL | Status: DC | PRN

## 2013-09-19 MED ORDER — SODIUM CHLORIDE 0.9 % IV SOLN
1000.0000 mg | INTRAVENOUS | Status: AC
  Administered 2013-09-19: 1000 mg via INTRAVENOUS
  Filled 2013-09-19: qty 10

## 2013-09-19 MED ORDER — METOCLOPRAMIDE HCL 10 MG PO TABS: 5.0000 mg | ORAL_TABLET | Freq: Three times a day (TID) | ORAL | Status: DC | PRN

## 2013-09-19 MED ORDER — PANTOPRAZOLE SODIUM 40 MG PO TBEC
40.0000 mg | DELAYED_RELEASE_TABLET | Freq: Every day | ORAL | Status: DC
  Administered 2013-09-20 – 2013-09-22 (×3): 40 mg via ORAL
  Filled 2013-09-19 (×2): qty 1

## 2013-09-19 MED ORDER — SENNOSIDES-DOCUSATE SODIUM 8.6-50 MG PO TABS: 1.0000 | ORAL_TABLET | Freq: Every evening | ORAL | Status: DC | PRN

## 2013-09-19 MED ORDER — NEOSTIGMINE METHYLSULFATE 1 MG/ML IJ SOLN
INTRAMUSCULAR | Status: DC | PRN
  Administered 2013-09-19: 3 mg via INTRAVENOUS

## 2013-09-19 MED ORDER — KCL IN DEXTROSE-NACL 20-5-0.45 MEQ/L-%-% IV SOLN
INTRAVENOUS | Status: DC
  Administered 2013-09-19: 125 mL/h via INTRAVENOUS
  Administered 2013-09-20: 01:00:00 via INTRAVENOUS
  Filled 2013-09-19 (×13): qty 1000

## 2013-09-19 MED ORDER — OXYCODONE HCL 5 MG PO TABS
5.0000 mg | ORAL_TABLET | ORAL | Status: DC | PRN
  Administered 2013-09-19: 5 mg via ORAL
  Administered 2013-09-19 – 2013-09-20 (×4): 10 mg via ORAL
  Filled 2013-09-19 (×4): qty 2

## 2013-09-19 MED ORDER — ONDANSETRON HCL 4 MG PO TABS: 4.0000 mg | ORAL_TABLET | Freq: Four times a day (QID) | ORAL | Status: DC | PRN

## 2013-09-19 MED ORDER — MIDAZOLAM HCL 2 MG/2ML IJ SOLN
INTRAMUSCULAR | Status: AC
  Filled 2013-09-19: qty 2

## 2013-09-19 MED ORDER — ONDANSETRON HCL 4 MG/2ML IJ SOLN
INTRAMUSCULAR | Status: DC | PRN
  Administered 2013-09-19: 4 mg via INTRAVENOUS

## 2013-09-19 MED ORDER — LIDOCAINE HCL (CARDIAC) 20 MG/ML IV SOLN
INTRAVENOUS | Status: DC | PRN
  Administered 2013-09-19: 40 mg via INTRAVENOUS

## 2013-09-19 MED ORDER — LACTATED RINGERS IV SOLN
INTRAVENOUS | Status: DC
  Administered 2013-09-19: 50 mL/h via INTRAVENOUS

## 2013-09-19 MED ORDER — BISACODYL 5 MG PO TBEC: 5.0000 mg | DELAYED_RELEASE_TABLET | Freq: Every day | ORAL | Status: DC | PRN

## 2013-09-19 MED ORDER — AMLODIPINE BESYLATE 10 MG PO TABS
10.0000 mg | ORAL_TABLET | Freq: Every day | ORAL | Status: DC
  Administered 2013-09-20 – 2013-09-23 (×3): 10 mg via ORAL
  Filled 2013-09-19 (×4): qty 1

## 2013-09-19 MED ORDER — ACETAMINOPHEN 650 MG RE SUPP: 650.0000 mg | Freq: Four times a day (QID) | RECTAL | Status: DC | PRN

## 2013-09-19 MED ORDER — FENTANYL CITRATE 0.05 MG/ML IJ SOLN
INTRAMUSCULAR | Status: AC
  Filled 2013-09-19: qty 2

## 2013-09-19 MED ORDER — GLYCOPYRROLATE 0.2 MG/ML IJ SOLN
INTRAMUSCULAR | Status: DC | PRN
  Administered 2013-09-19: 0.4 mg via INTRAVENOUS

## 2013-09-19 MED ORDER — LIDOCAINE HCL (CARDIAC) 20 MG/ML IV SOLN
INTRAVENOUS | Status: AC
  Filled 2013-09-19: qty 5

## 2013-09-19 MED ORDER — METHOCARBAMOL 500 MG PO TABS: 500.0000 mg | ORAL_TABLET | Freq: Two times a day (BID) | ORAL | Status: DC

## 2013-09-19 MED ORDER — HYDROCHLOROTHIAZIDE 25 MG PO TABS
25.0000 mg | ORAL_TABLET | Freq: Every day | ORAL | Status: DC
  Administered 2013-09-19 – 2013-09-23 (×5): 25 mg via ORAL
  Filled 2013-09-19 (×5): qty 1

## 2013-09-19 MED ORDER — METHOCARBAMOL 500 MG PO TABS
500.0000 mg | ORAL_TABLET | Freq: Four times a day (QID) | ORAL | Status: DC | PRN
  Administered 2013-09-19 – 2013-09-20 (×2): 500 mg via ORAL
  Filled 2013-09-19 (×3): qty 1

## 2013-09-19 MED ORDER — MENTHOL 3 MG MT LOZG: 1.0000 | LOZENGE | OROMUCOSAL | Status: DC | PRN

## 2013-09-19 MED ORDER — SODIUM CHLORIDE 0.9 % IR SOLN
Status: DC | PRN
  Administered 2013-09-19: 1000 mL

## 2013-09-19 MED ORDER — ASPIRIN EC 325 MG PO TBEC
325.0000 mg | DELAYED_RELEASE_TABLET | Freq: Every day | ORAL | Status: DC
  Administered 2013-09-20 – 2013-09-23 (×4): 325 mg via ORAL
  Filled 2013-09-19 (×5): qty 1

## 2013-09-19 MED ORDER — GABAPENTIN 600 MG PO TABS
600.0000 mg | ORAL_TABLET | Freq: Three times a day (TID) | ORAL | Status: DC
  Administered 2013-09-19 – 2013-09-23 (×11): 600 mg via ORAL
  Filled 2013-09-19 (×13): qty 1

## 2013-09-19 MED ORDER — ONDANSETRON HCL 4 MG/2ML IJ SOLN: 4.0000 mg | Freq: Four times a day (QID) | INTRAMUSCULAR | Status: DC | PRN

## 2013-09-19 MED ORDER — METOPROLOL SUCCINATE ER 50 MG PO TB24
50.0000 mg | ORAL_TABLET | Freq: Every day | ORAL | Status: DC
  Administered 2013-09-19 – 2013-09-22 (×3): 50 mg via ORAL
  Filled 2013-09-19 (×5): qty 1

## 2013-09-19 MED ORDER — ASPIRIN EC 325 MG PO TBEC: 325.0000 mg | DELAYED_RELEASE_TABLET | Freq: Two times a day (BID) | ORAL | Status: DC

## 2013-09-19 MED ORDER — FENTANYL CITRATE 0.05 MG/ML IJ SOLN
INTRAMUSCULAR | Status: DC | PRN
  Administered 2013-09-19: 50 ug via INTRAVENOUS
  Administered 2013-09-19: 100 ug via INTRAVENOUS
  Administered 2013-09-19: 50 ug via INTRAVENOUS

## 2013-09-19 MED ORDER — DIPHENHYDRAMINE HCL 12.5 MG/5ML PO ELIX: 12.5000 mg | ORAL_SOLUTION | ORAL | Status: DC | PRN

## 2013-09-19 MED ORDER — OXYCODONE HCL 5 MG PO TABS
ORAL_TABLET | ORAL | Status: AC
  Filled 2013-09-19: qty 1

## 2013-09-19 MED ORDER — METOCLOPRAMIDE HCL 5 MG/ML IJ SOLN: 5.0000 mg | Freq: Three times a day (TID) | INTRAMUSCULAR | Status: DC | PRN

## 2013-09-19 MED ORDER — IRBESARTAN 150 MG PO TABS
150.0000 mg | ORAL_TABLET | Freq: Every day | ORAL | Status: DC
  Administered 2013-09-19 – 2013-09-23 (×5): 150 mg via ORAL
  Filled 2013-09-19 (×5): qty 1

## 2013-09-19 MED ORDER — KCL IN DEXTROSE-NACL 20-5-0.45 MEQ/L-%-% IV SOLN
INTRAVENOUS | Status: AC
  Filled 2013-09-19: qty 1000

## 2013-09-19 MED ORDER — METHOCARBAMOL 100 MG/ML IJ SOLN
500.0000 mg | Freq: Four times a day (QID) | INTRAVENOUS | Status: DC | PRN
  Administered 2013-09-19: 500 mg via INTRAVENOUS
  Filled 2013-09-19: qty 5

## 2013-09-19 MED ORDER — MIDAZOLAM HCL 5 MG/5ML IJ SOLN
INTRAMUSCULAR | Status: DC | PRN
  Administered 2013-09-19: 2 mg via INTRAVENOUS

## 2013-09-19 MED ORDER — MAGNESIUM CITRATE PO SOLN: 1.0000 | Freq: Once | ORAL | Status: AC | PRN

## 2013-09-19 MED ORDER — BUPIVACAINE-EPINEPHRINE (PF) 0.5% -1:200000 IJ SOLN
INTRAMUSCULAR | Status: AC
  Filled 2013-09-19: qty 10

## 2013-09-19 MED ORDER — HYDROMORPHONE HCL PF 1 MG/ML IJ SOLN
1.0000 mg | INTRAMUSCULAR | Status: DC | PRN
  Administered 2013-09-19 (×2): 1 mg via INTRAVENOUS
  Filled 2013-09-19 (×2): qty 1

## 2013-09-19 MED ORDER — BUPIVACAINE-EPINEPHRINE 0.5% -1:200000 IJ SOLN
INTRAMUSCULAR | Status: DC | PRN
  Administered 2013-09-19: 20 mL

## 2013-09-19 MED ORDER — OXYCODONE-ACETAMINOPHEN 5-325 MG PO TABS: 1.0000 | ORAL_TABLET | ORAL | Status: DC | PRN

## 2013-09-19 MED ORDER — DOCUSATE SODIUM 100 MG PO CAPS
100.0000 mg | ORAL_CAPSULE | Freq: Two times a day (BID) | ORAL | Status: DC
  Administered 2013-09-19 – 2013-09-22 (×6): 100 mg via ORAL
  Filled 2013-09-19 (×8): qty 1

## 2013-09-19 MED ORDER — LACTATED RINGERS IV SOLN
INTRAVENOUS | Status: DC | PRN
  Administered 2013-09-19 (×2): via INTRAVENOUS

## 2013-09-19 MED ORDER — DEXAMETHASONE SODIUM PHOSPHATE 4 MG/ML IJ SOLN
INTRAMUSCULAR | Status: AC
  Filled 2013-09-19: qty 1

## 2013-09-19 MED ORDER — ROCURONIUM BROMIDE 100 MG/10ML IV SOLN
INTRAVENOUS | Status: DC | PRN
  Administered 2013-09-19: 40 mg via INTRAVENOUS

## 2013-09-19 SURGICAL SUPPLY — 67 items
BOWL SMART MIX CTS (DISPOSABLE) IMPLANT
BRUSH FEMORAL CANAL (MISCELLANEOUS) IMPLANT
CONT SPEC 4OZ CLIKSEAL STRL BL (MISCELLANEOUS) ×3 IMPLANT
COVER SURGICAL LIGHT HANDLE (MISCELLANEOUS) ×3 IMPLANT
DRAPE C-ARM 42X72 X-RAY (DRAPES) IMPLANT
DRAPE ORTHO SPLIT 77X108 STRL (DRAPES) ×3
DRAPE PROXIMA HALF (DRAPES) ×3 IMPLANT
DRAPE SURG ORHT 6 SPLT 77X108 (DRAPES) ×1 IMPLANT
DRAPE U-SHAPE 47X51 STRL (DRAPES) ×3 IMPLANT
DRILL BIT 7/64X5 (BIT) ×3 IMPLANT
DRSG AQUACEL AG ADV 3.5X10 (GAUZE/BANDAGES/DRESSINGS) ×3 IMPLANT
DRSG MEPILEX BORDER 4X8 (GAUZE/BANDAGES/DRESSINGS) ×3 IMPLANT
DURAPREP 26ML APPLICATOR (WOUND CARE) ×3 IMPLANT
ELECT BLADE 4.0 EZ CLEAN MEGAD (MISCELLANEOUS)
ELECT REM PT RETURN 9FT ADLT (ELECTROSURGICAL) ×3
ELECTRODE BLDE 4.0 EZ CLN MEGD (MISCELLANEOUS) IMPLANT
ELECTRODE REM PT RTRN 9FT ADLT (ELECTROSURGICAL) ×1 IMPLANT
ELIMINATOR HOLE APEX DEPUY (Hips) ×3 IMPLANT
EVACUATOR 1/8 PVC DRAIN (DRAIN) IMPLANT
GAUZE XEROFORM 5X9 LF (GAUZE/BANDAGES/DRESSINGS) ×3 IMPLANT
GLOVE BIO SURGEON STRL SZ7.5 (GLOVE) ×3 IMPLANT
GLOVE BIO SURGEON STRL SZ8.5 (GLOVE) ×6 IMPLANT
GLOVE BIOGEL PI IND STRL 8 (GLOVE) ×2 IMPLANT
GLOVE BIOGEL PI IND STRL 9 (GLOVE) ×1 IMPLANT
GLOVE BIOGEL PI INDICATOR 8 (GLOVE) ×4
GLOVE BIOGEL PI INDICATOR 9 (GLOVE) ×2
GOWN STRL REUS W/ TWL LRG LVL3 (GOWN DISPOSABLE) ×2 IMPLANT
GOWN STRL REUS W/ TWL XL LVL3 (GOWN DISPOSABLE) ×5 IMPLANT
GOWN STRL REUS W/TWL LRG LVL3 (GOWN DISPOSABLE) ×6
GOWN STRL REUS W/TWL XL LVL3 (GOWN DISPOSABLE) ×15
HANDPIECE INTERPULSE COAX TIP (DISPOSABLE)
HEAD CERAMIC 36 PLUS5 (Hips) ×3 IMPLANT
HEEL PROTECTOR  874200 (MISCELLANEOUS)
HEEL PROTECTOR 874200 (MISCELLANEOUS) IMPLANT
HOOD PEEL AWAY FACE SHEILD DIS (HOOD) ×6 IMPLANT
KIT BASIN OR (CUSTOM PROCEDURE TRAY) ×3 IMPLANT
KIT ROOM TURNOVER OR (KITS) ×3 IMPLANT
LINER MARATHON 4MM 10DEG 36X52 (Hips) ×3 IMPLANT
MANIFOLD NEPTUNE II (INSTRUMENTS) ×3 IMPLANT
NEEDLE 22X1 1/2 (OR ONLY) (NEEDLE) ×3 IMPLANT
NOZZLE PRISM 8.5MM (MISCELLANEOUS) IMPLANT
NS IRRIG 1000ML POUR BTL (IV SOLUTION) ×6 IMPLANT
PACK TOTAL JOINT (CUSTOM PROCEDURE TRAY) ×3 IMPLANT
PAD ARMBOARD 7.5X6 YLW CONV (MISCELLANEOUS) ×6 IMPLANT
PASSER SUT SWANSON 36MM LOOP (INSTRUMENTS) ×3 IMPLANT
PIN SECTOR W/GRIP ACE CUP 52MM (Hips) ×3 IMPLANT
PRESSURIZER FEMORAL UNIV (MISCELLANEOUS) IMPLANT
SCREW 6.5MMX25MM (Screw) ×3 IMPLANT
SET HNDPC FAN SPRY TIP SCT (DISPOSABLE) IMPLANT
SLEEVE SURGEON STRL (DRAPES) IMPLANT
SPONGE GAUZE 4X4 12PLY (GAUZE/BANDAGES/DRESSINGS) ×3 IMPLANT
SPONGE LAP 18X18 X RAY DECT (DISPOSABLE) IMPLANT
STAPLER VISISTAT 35W (STAPLE) ×3 IMPLANT
SUT ETHIBOND 2 V 37 (SUTURE) ×3 IMPLANT
SUT ETHILON 3 0 FSL (SUTURE) ×3 IMPLANT
SUT VIC AB 0 CTB1 27 (SUTURE) ×3 IMPLANT
SUT VIC AB 1 CTX 36 (SUTURE) ×3
SUT VIC AB 1 CTX36XBRD ANBCTR (SUTURE) ×1 IMPLANT
SUT VIC AB 2-0 CTB1 (SUTURE) ×3 IMPLANT
SYR 20ML ECCENTRIC (SYRINGE) ×3 IMPLANT
SYR CONTROL 10ML LL (SYRINGE) ×3 IMPLANT
TOWEL OR 17X24 6PK STRL BLUE (TOWEL DISPOSABLE) ×3 IMPLANT
TOWEL OR 17X26 10 PK STRL BLUE (TOWEL DISPOSABLE) ×3 IMPLANT
TOWER CARTRIDGE SMART MIX (DISPOSABLE) IMPLANT
TRAY FOLEY CATH 14FR (SET/KITS/TRAYS/PACK) IMPLANT
TUBE ANAEROBIC SPECIMEN COL (MISCELLANEOUS) IMPLANT
WATER STERILE IRR 1000ML POUR (IV SOLUTION) ×9 IMPLANT

## 2013-09-19 NOTE — Progress Notes (Signed)
Utilization review completed.  

## 2013-09-19 NOTE — Op Note (Signed)
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  Surgeon: Kathalene Frames. Mayer Camel M.D.  Assistant: Kerry Hough. Barton Dubois  (present throughout entire procedure and necessary for timely completion of the procedure)  Estimated blood loss: 400 cc  Fluid replacement: 1500 cc of crystalloid  Complications: None  Specimens: The ASR shell and head was sent to pathology using standard chain of custody procedure. We also sent Gram stain and cultures of the synovial fluid, pseudo tumor, although it did not grossly appear to be infected  Indications: Patient with an ASR on Pinnacle stem total hip, place and 2009, that did very well until a few months ago when he had increasing groin pain. The pain wakes him up at night and recently got to the point where he could no longer go walking on a regular basis. MRI scan showed fluid collection, but no bony destruction. Plain x-rays show no change in the position of the components and the stem appears to be well ingrown. Risks and benefits of revision surgery have been discussed and questions answered.  Procedure: Patient was identified by arm band receive preoperative IV antibiotics in the holding area at, and hospital. He was then taken to the operating room where the appropriate anesthetic monitors were attached and general endotracheal anesthesia induced with the patient in the supine position. She was then rolled into the R lateral decubitus position and fixed there with a mark 2 pelvic clamp. A Foley catheter was inserted and the limb prepped and draped in usual sterile fashion from the ankle to the hemipelvis. Time out procedure performed. Skin along the lateral hip and thigh infiltrated with 20 cc of 1/2% Marcaine and  epinephrine solution. We began the operation by recreating the old posterior lateral incision 18 cm in line through the skin and subcutaneous tissue down to the level of the IT band which was cut in line with the skin incision. 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 This was peeled off the greater trochanter entering the hip joint where caseous material was encountered and removed. We thinned out the posterior capsule removing the pseudotumor and reflected posteriorly.. We then remove scar tissue from around to the ASR cup and femoral stem trunnion, dislocated a total hip and removed the ASR head with a mallet and metal cylinder. We continued to remove scar tissue from around the acetabular component. Gram stain and culture specimens were sent from this tissue. We began loosening the cup by placing a 1/4 inch osteotome between the edge of the acetabular component and the bone. We then used the short 50 IInnomed curved osteotome around the edge of the cup and at that point it came out relatively easily indicating no and growth. Minimal bone was noticed on the ingrowth surface of the cup and fibrous tissue is then stripped from the acetabulum. We reamed up to a 51 mm basket reamer obtaining good coverage in all quadrants irrigated with normal saline solution. We then hammered into place a 52 mm Gryption cup with superior pole cluster, in 45 of abduction and 20 of anteversion. A single 25 mm dome screw was placed obtaining good compression. We then tested the stability of the Pinnacle stem with axial and  rotatory compression. It was stable. A central occluder was screwed into the acetabular shell followed by a 10 polyethylene liner index posterior and superior. A trial reduction was then performed with a +5 36 mm femoral head instability was noted to 90 of flexion 70 of internal rotation and in full extension the hip could not be dislocated  with external rotation. At this point a real +5 36 mm ceramic head was hammered into place, the hip reduced and irrigated with normal saline solution. The capsular flap was repaired back to the intertrochanteric crest through drill holes with #2 Ethibond suture. We then closed the IT band with running #1 Vicryl suture, the subcutaneous tissue with 0 and 2-0 undyed Vicryl suture, the skin was closed with running interlocking 3-0 nylon suture. A dressing of Mepilex was then applied, the patient was unclamped a rolled supine awakened extubated and taken to the recovery without difficulty.

## 2013-09-19 NOTE — Interval H&P Note (Signed)
History and Physical Interval Note:  09/19/2013 12:40 PM  Sierra [name]  has presented today for surgery, with the diagnosis of PAINFUL LEFT ASR RECALL TOTAL HIP ARTHROPLASTY  The various methods of treatment have been discussed with the patient and family. After consideration of risks, benefits and other options for treatment, the patient has consented to  Procedure(s): LEFT TOTAL HIP REVISION (Left) as a surgical intervention .  The patient's history has been reviewed, patient examined, no change in status, stable for surgery.  I have reviewed the patient's chart and labs.  Questions were answered to the patient's satisfaction.     Kerin Salen

## 2013-09-19 NOTE — Plan of Care (Signed)
Problem: Consults Goal: Diagnosis- Total Joint Replacement Revision Total Hip Left        

## 2013-09-19 NOTE — Discharge Instructions (Signed)
Total Hip Replacement °Care After °Refer to this sheet in the next few weeks. These instructions provide you with information on caring for yourself after your procedure. Your caregiver also may give you specific instructions. Your treatment has been planned according to the most current medical practices, but problems sometimes occur. Call your caregiver if you have any problems or questions after your procedure. °HOME CARE INSTRUCTIONS  °Your caregiver will give you specific precautions for certain types of movement. Additional instructions include: °· Take over-the-counter or prescription medicines for pain, discomfort, or fever only as directed by your caregiver. °· Take quick showers (3 5 min) rather than bathe until your caregiver tells you that you can take baths again. °· Avoid lifting until your caregiver instructs you otherwise. °· Use a raised toilet seat and avoid sitting in low chairs as instructed by your caregiver. °· Use crutches or a walker as instructed by your caregiver. °SEEK MEDICAL CARE IF: °· You have difficulty breathing. °· Your wound is red, swollen, or has become increasingly painful. °· You have pus draining from your wound. °· You have a bad smell coming from your wound. °· You have persistent bleeding from your wound. °· Your wound breaks open after sutures (stitches) or staples have been removed. °SEEK IMMEDIATE MEDICAL CARE IF:  °· You have a fever. °· You have a rash. °· You have pain or swelling in your calf or thigh. °· You have shortness of breath or chest pain. °MAKE SURE YOU: °· Understand these instructions. °· Will watch your condition. °· Will get help right away if you are not doing well or get worse. °Document Released: 12/25/2004 Document Revised: 12/07/2011 Document Reviewed: 07/25/2011 °[employer]® Patient Information ©2014 [employer]. ° °

## 2013-09-19 NOTE — Transfer of Care (Signed)
Immediate Anesthesia Transfer of Care Note  Patient: Sierra [name]  Procedure(s) Performed: Procedure(s): LEFT TOTAL HIP REVISION (Left)  Patient Location: PACU  Anesthesia Type:General  Level of Consciousness: awake, alert  and oriented  Airway & Oxygen Therapy: Patient Spontanous Breathing and Patient connected to nasal cannula oxygen  Post-op Assessment: Report given to PACU RN and Post -op Vital signs reviewed and stable  Post vital signs: Reviewed and stable  Complications: No apparent anesthesia complications

## 2013-09-19 NOTE — Anesthesia Preprocedure Evaluation (Signed)
Anesthesia Evaluation  Patient identified by MRN, date of birth, ID band Patient awake    Reviewed: Allergy & Precautions, H&P , NPO status , Patient's Chart, lab work & pertinent test results  Airway Mallampati: II TM Distance: >3 FB Neck ROM: Full    Dental  (+) Teeth Intact, Dental Advisory Given   Pulmonary  breath sounds clear to auscultation        Cardiovascular hypertension, Rhythm:Regular Rate:Normal     Neuro/Psych    GI/Hepatic   Endo/Other    Renal/GU      Musculoskeletal   Abdominal (+) - obese,   Peds  Hematology   Anesthesia Other Findings   Reproductive/Obstetrics                           Anesthesia Physical Anesthesia Plan  ASA: III  Anesthesia Plan: General   Post-op Pain Management:    Induction: Intravenous  Airway Management Planned: Oral ETT  Additional Equipment:   Intra-op Plan:   Post-operative Plan: Extubation in OR  Informed Consent: I have reviewed the patients History and Physical, chart, labs and discussed the procedure including the risks, benefits and alternatives for the proposed anesthesia with the patient or authorized representative who has indicated his/her understanding and acceptance.   Dental advisory given  Plan Discussed with: CRNA and Anesthesiologist  Anesthesia Plan Comments: (Htn L. Hip prosthesis recall GERD  Plan GA with oral ETT  Roberts Gaudy, MD)        Anesthesia Quick Evaluation

## 2013-09-20 ENCOUNTER — Encounter (HOSPITAL_COMMUNITY): Payer: Self-pay | Admitting: Orthopedic Surgery

## 2013-09-20 LAB — CBC
HEMATOCRIT: 32.2 % — AB (ref 36.0–46.0)
Hemoglobin: 10.7 g/dL — ABNORMAL LOW (ref 12.0–15.0)
MCH: 30.9 pg (ref 26.0–34.0)
MCHC: 33.2 g/dL (ref 30.0–36.0)
MCV: 93.1 fL (ref 78.0–100.0)
Platelets: 218 10*3/uL (ref 150–400)
RBC: 3.46 MIL/uL — ABNORMAL LOW (ref 3.87–5.11)
RDW: 13.9 % (ref 11.5–15.5)
WBC: 10.9 10*3/uL — ABNORMAL HIGH (ref 4.0–10.5)

## 2013-09-20 LAB — BASIC METABOLIC PANEL
BUN: 21 mg/dL (ref 6–23)
CO2: 23 mEq/L (ref 19–32)
Calcium: 8.3 mg/dL — ABNORMAL LOW (ref 8.4–10.5)
Chloride: 102 mEq/L (ref 96–112)
Creatinine, Ser: 0.6 mg/dL (ref 0.50–1.10)
GFR calc Af Amer: >90 mL/min (ref >90)
Glucose, Bld: 135 mg/dL — ABNORMAL HIGH (ref 70–99)
POTASSIUM: 4.8 meq/L (ref 3.7–5.3)
Sodium: 140 mEq/L (ref 137–147)

## 2013-09-20 NOTE — Evaluation (Signed)
Occupational Therapy Evaluation Patient Details Name: Sierra [name] MRN: [id number] DOB: April 09, 1946 Today's Date: 09/20/2013    History of Present Illness Adm for Lt total hip revision (originally done 2009 and RTHR 2011)    Clinical Impression   Patient's PLOF with basic self care skills prior to this surgery was independent.  Patient upon evaluation presents extremely lethargic, groggy with slurred speech, difficulty maintaining arousal (patient has recently had pain medication)  Patient has had prior surgeries, and has bathroom equipment already.  Patient able to state "90 degrees" as part of her hip precautions, and she was aware that she could not cross her legs, although due to lethargy, no additional education was offered, as patient not attentive enough to hold onto new information.  Will need further information regarding bathroom set up, and amount of assistance husband can provide at discharge.      Follow Up Recommendations  Home health OT    Equipment Recommendations   (Patient has equipment from previous surgery)    Recommendations for Other Services       Precautions / Restrictions Precautions Precautions: Fall;Posterior Hip Restrictions Weight Bearing Restrictions: Yes LLE Weight Bearing: Weight bearing as tolerated      Mobility Bed Mobility Overal bed mobility: Needs Assistance Bed Mobility: Sit to Supine       Sit to supine: Max assist   General bed mobility comments: Patient very stiff after sitting in chair for a few hours.  Assist to maneuver left leg  Transfers Overall transfer level: Needs assistance Equipment used: Rolling walker (2 wheeled) Transfers: Stand Pivot Transfers Sit to Stand: Max assist Stand pivot transfers: Max assist       General transfer comment: increased assist to push with legs for stand    Balance Overall balance assessment: Needs assistance   Sitting balance-Leahy Scale: Fair Sitting balance - Comments: lethargy,  increased sway   Standing balance support: Bilateral upper extremity supported;During functional activity Standing balance-Leahy Scale: Zero Standing balance comment: lethargy affecting standing balance                            ADL Overall ADL's : Needs assistance/impaired;Modified independent Eating/Feeding: Supervision/ safety;Set up       Upper Body Bathing: Supervision/ safety;Set up;Sitting   Lower Body Bathing: Total assistance;Cueing for safety;Sit to/from stand   Upper Body Dressing : Minimal assistance;Sitting   Lower Body Dressing: Total assistance;Cueing for safety;Cueing for sequencing;Sit to/from stand   Toilet Transfer: Maximal assistance;Stand-pivot;BSC;RW   Toileting- Clothing Manipulation and Hygiene: Total assistance;Cueing for safety;Cueing for sequencing;Sit to/from stand   Tub/ Shower Transfer: Maximal assistance;Cueing for safety;Cueing for sequencing;Shower seat;Walk-in shower (stand step)   Functional mobility during ADLs: Maximal assistance;Rolling walker;Cueing for safety       Vision                     Perception Perception Perception Tested?: No   Praxis Praxis Praxis tested?: Within functional limits    Pertinent Vitals/Pain 117/38, O2 74% at beginning of session - 02 applied via [place] - up to 95%.   Pain - 0/10 at rest, 5-6/10 with activity     Hand Dominance     Extremity/Trunk Assessment Upper Extremity Assessment Upper Extremity Assessment: Overall WFL for tasks assessed   Lower Extremity Assessment Lower Extremity Assessment: Defer to PT evaluation   Cervical / Trunk Assessment Cervical / Trunk Assessment: Normal   Communication Communication Communication: Other (comment) (extremely  lethargic, in and out of arousal)   Cognition Arousal/Alertness: Lethargic;Suspect due to medications Behavior During Therapy:  (lethargy, difficulty remaining awake) Overall Cognitive Status: No family/caregiver present to  determine baseline cognitive functioning                     General Comments       Exercises       Shoulder Instructions      Home Living Family/patient expects to be discharged to:: Private residence Living Arrangements: Spouse/significant other Available Help at Discharge: Family Type of Home:  (unable to get consistent report from patient - lethargy)             Bathroom Shower/Tub: Walk-in shower         Home Equipment: Shower seat          Prior Functioning/Environment               OT Diagnosis: Generalized weakness;Cognitive deficits;Acute pain (assume cognition related to pain medication / lethargy)   OT Problem List: Decreased strength;Impaired balance (sitting and/or standing);Decreased cognition;Decreased knowledge of precautions;Pain;Decreased range of motion;Decreased safety awareness;Cardiopulmonary status limiting activity;Increased edema;Decreased activity tolerance;Decreased knowledge of use of DME or AE;Impaired sensation   OT Treatment/Interventions: Self-care/ADL training;DME and/or AE instruction;Therapeutic activities;Balance training;Therapeutic exercise;Cognitive remediation/compensation;Modalities;Patient/family education    OT Goals(Current goals can be found in the care plan section) Acute Rehab OT Goals Patient Stated Goal: return home with husband OT Goal Formulation: Patient unable to participate in goal setting Time For Goal Achievement: 09/27/13 Potential to Achieve Goals: Good  OT Frequency: Min 3X/week   Barriers to D/C:            Co-evaluation              End of Session Equipment Utilized During Treatment: Oxygen;Rolling walker Nurse Communication: Mobility status  Activity Tolerance: Patient limited by lethargy Patient left: in bed;with bed alarm set;with call bell/phone within reach   Time: 1505-1530 OT Time Calculation (min): 25 min Charges:  OT General Charges $OT Visit: 1 Procedure OT  Evaluation $Initial OT Evaluation Tier I: 1 Procedure OT Treatments $Self Care/Home Management : 23-37 mins G-Codes:    Mariah Milling Oct 19, 2013, 3:45 PM

## 2013-09-20 NOTE — Anesthesia Postprocedure Evaluation (Signed)
  Anesthesia Post-op Note  Patient: Sierra [name]  Procedure(s) Performed: Procedure(s): LEFT TOTAL HIP REVISION (Left)  Patient Location: PACU  Anesthesia Type:General  Level of Consciousness: awake, alert , oriented and patient cooperative  Airway and Oxygen Therapy: Patient Spontanous Breathing  Post-op Pain: moderate  Post-op Assessment: Post-op Vital signs reviewed, Patient's Cardiovascular Status Stable, Respiratory Function Stable, Patent Airway, No signs of Nausea or vomiting and Pain level controlled  Post-op Vital Signs: stable  Complications: No apparent anesthesia complications

## 2013-09-20 NOTE — Progress Notes (Signed)
Patient ID: Sierra [name], female   DOB: 14-Jul-1945, 68 y.o.   MRN: [id number] PATIENT ID: Sierra [name]  MRN: [id number]  DOB/AGE:  Jun 03, 1946 / 68 y.o.  1 Day Post-Op Procedure(s) (LRB): LEFT TOTAL HIP REVISION (Left)    PROGRESS NOTE Subjective: Patient is alert, oriented,no Nausea, no Vomiting, yes passing gas, no Bowel Movement. Taking PO well. Denies SOB, Chest or Calf Pain. Using Incentive Spirometer, PAS in place. Ambulate the patient has ambulated in the room with assistance weight bearing as tolerated Patient reports pain as 4 on 0-10 scale  .    Objective: Vital signs in last 24 hours: Filed Vitals:   09/20/13 0000 09/20/13 0243 09/20/13 0400 09/20/13 0602  BP:  129/58  140/50  Pulse:  74  82  Temp:  97.4 F (36.3 C)  97.6 F (36.4 C)  TempSrc:  Oral  Oral  Resp: 17 18 16 18   Height:      Weight:      SpO2:  98%  98%      Intake/Output from previous day: I/O last 3 completed shifts: In: 79 [P.O.:360; I.V.:3800; IV Piggyback:110] Out: 200 [Blood:200]   Intake/Output this shift:     LABORATORY DATA:  Recent Labs  09/20/13 0735  WBC 10.9*  HGB 10.7*  HCT 32.2*  PLT 218    Examination: Neurologically intact ABD soft Neurovascular intact Sensation intact distally Intact pulses distally Dorsiflexion/Plantar flexion intact Incision: no drainage No cellulitis present Compartment soft} XR AP&Lat of hip shows well placed\fixed THA, this is a revision with a single dome screw that is also in good position  Assessment:   1 Day Post-Op Procedure(s) (LRB): LEFT TOTAL HIP REVISION (Left), [place] ASR cup with ultimate head the cup was loose and there was caseous material intracapsular ADDITIONAL DIAGNOSIS:    Plan: PT/OT WBAT, THA  posterior precautions  DVT Prophylaxis: SCDx72 hrs, ASA 325 mg BID x 2 weeks  DISCHARGE PLAN: Home  DISCHARGE NEEDS: HHPT, HHRN, CPM, Walker and 3-in-1 comode seat

## 2013-09-20 NOTE — Evaluation (Signed)
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 Pt moving very slowly today and exhausted after walking 17 feet. Anticipate she will need until Saturday to progress her mobility to be ready to d/c home and negotiate 4 steps to enter. Pt will benefit from skilled PT to increase their independence and safety with mobility to allow discharge to the venue listed below.        Follow Up Recommendations Home health PT;Supervision for mobility/OOB    Equipment Recommendations  None recommended by PT    Recommendations for Other Services OT consult     Precautions / Restrictions Precautions Precautions: Posterior Hip;Fall Restrictions Weight Bearing Restrictions: Yes LLE Weight Bearing: Weight bearing as tolerated      Mobility  Bed Mobility Overal bed mobility: Needs Assistance Bed Mobility: Supine to Sit     Supine to sit: Mod assist     General bed mobility comments: exit bed to Lt to simulate home; assist to move LLE  Transfers Overall transfer level: Needs assistance Equipment used: Rolling walker (2 wheeled) Transfers: Sit to/from Stand Sit to Stand: Min assist;From elevated surface (simulate bed height)         General transfer comment: assist to push to stand  Ambulation/Gait Ambulation/Gait assistance: Min guard Ambulation Distance (Feet): 17 Feet Assistive device: Rolling walker (2 wheeled) Gait Pattern/deviations: Step-to pattern;Decreased stride length     General Gait Details: vc for sequencing and use of walker   Stairs            Wheelchair Mobility    Modified Rankin (Stroke Patients Only)       Balance                                              Pertinent Vitals/Pain 5/10 Lt hip at rest; 8/10 with activity; RN provided medication to assist with pain control; patient repositioned for comfort     Home Living Family/patient expects to be discharged to:: Private residence Living Arrangements: Spouse/significant other Available Help at Discharge: Family;Available 24 hours/day Type of Home: House Home Access: Stairs to enter Entrance Stairs-Rails: Right;Left (not sure she can reach both) Entrance Stairs-Number of Steps: 4 Home Layout: One level Home Equipment: Walker - 2 wheels;Cane - single point;Bedside commode;Shower seat;Toilet riser;Adaptive equipment      Prior Function Level of Independence: Independent               Hand Dominance        Extremity/Trunk Assessment   Upper Extremity Assessment: Overall WFL for tasks assessed           Lower Extremity Assessment: LLE deficits/detail   LLE Deficits / Details: limited by pain post-op; <3/5 with assist to move leg  Cervical / Trunk Assessment: Normal  Communication   Communication: No difficulties  Cognition Arousal/Alertness: Lethargic;Suspect due to medications Behavior During Therapy: Flat affect Overall Cognitive Status: Within Functional Limits for tasks assessed                      General Comments General comments (skin integrity, edema, etc.): Pt reports she was  TDWB after each prior hip surgery; not accustomed to [facility]    Exercises        Assessment/Plan    PT Assessment Patient needs continued PT services  PT Diagnosis Difficulty walking;Acute pain   PT Problem List Decreased strength;Decreased activity tolerance;Decreased mobility;Decreased knowledge of use of DME;Decreased knowledge of precautions;Pain  PT Treatment Interventions DME instruction;Gait training;Stair training;Functional mobility training;Therapeutic activities;Therapeutic exercise;Patient/family education   PT Goals (Current goals can be found in the Care  Plan section) Acute Rehab PT Goals Patient Stated Goal: be able to get in the pool in June PT Goal Formulation: With patient Time For Goal Achievement: 09/23/13 Potential to Achieve Goals: Good    Frequency 7X/week   Barriers to discharge        Co-evaluation               End of Session Equipment Utilized During Treatment: Gait belt Activity Tolerance: Patient limited by fatigue;Patient limited by pain Patient left: in chair;with call bell/phone within reach Nurse Communication: Mobility status         Time: 9935-7017 PT Time Calculation (min): 53 min   Charges:   PT Evaluation $Initial PT Evaluation Tier I: 1 Procedure PT Treatments $Gait Training: 8-22 mins $Therapeutic Activity: 23-37 mins   PT G Codes:          Kaisy Severino 2013/09/22, 11:44 AM Pager [phone]

## 2013-09-20 NOTE — Progress Notes (Signed)
OT Cancellation Note  Patient Details Name: Sierra [name] MRN: [id number] DOB: 1946-05-28   Cancelled Treatment:     Attempted to see patient this pm.  Noted PT session this am.  Patient sleeping.  Able to arouse briefly to indicate not having any pain, but unable to remain awake.  Husband present with patient in room.  Will attempt to see patient later this pm as schedule allows.  Mariah Milling 09/20/2013, 1:31 PM

## 2013-09-20 NOTE — Care Management Note (Signed)
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     Action/Plan:   Case manager spoke with patient concerning home health and DME needs at discharge. Preoperatively setup with [facility], no changes. Patient has rolling walker and 3in1, this is her 3rd surgery. Husband to assist at discharge.   Anticipated DC Date:  09/21/2013   Anticipated DC Plan:  [place]  CM consult      PAC Choice  [place]   Choice offered to / List presented to:  C-1 Patient        [place] arranged  [place] PT      [place].   Status of service:  Completed, signed off Medicare Important Message given?   (If response is "NO", the following Medicare IM given date fields will be blank) Date Medicare IM given:   Date Additional Medicare IM given:    Discharge Disposition:  [place]

## 2013-09-21 DIAGNOSIS — R5381 Other malaise: Secondary | ICD-10-CM

## 2013-09-21 DIAGNOSIS — R5383 Other fatigue: Secondary | ICD-10-CM

## 2013-09-21 DIAGNOSIS — R4182 Altered mental status, unspecified: Secondary | ICD-10-CM

## 2013-09-21 LAB — CBC
HCT: 28.4 % — ABNORMAL LOW (ref 36.0–46.0)
Hemoglobin: 9.4 g/dL — ABNORMAL LOW (ref 12.0–15.0)
MCH: 31.4 pg (ref 26.0–34.0)
MCHC: 33.1 g/dL (ref 30.0–36.0)
MCV: 95 fL (ref 78.0–100.0)
Platelets: 197 10*3/uL (ref 150–400)
RBC: 2.99 MIL/uL — ABNORMAL LOW (ref 3.87–5.11)
RDW: 14.6 % (ref 11.5–15.5)
WBC: 8.6 10*3/uL (ref 4.0–10.5)

## 2013-09-21 MED ORDER — HYDROMORPHONE HCL PF 1 MG/ML IJ SOLN: 0.5000 mg | INTRAMUSCULAR | Status: DC | PRN

## 2013-09-21 NOTE — Progress Notes (Signed)
Reported to PA on call, Sierra [name], that pt appears to be continuing to improve.  She is able to eat dinner and seems to be using both hands without difficulty.  Pt and husband state that she is still dropping things without warning and that although she is feeling much better than this morning, she doesn't see any improvement in the last couple of hours.

## 2013-09-21 NOTE — Progress Notes (Signed)
Pt states "doesn't feel like herself", very drowsy, unable to stay awake unless in verbal interaction.  Assisting from bed to chair with two was barely able to stand and pivot. Knees "buckled", hands are "jerky" and she is dropping her cup and the phone.

## 2013-09-21 NOTE — Progress Notes (Signed)
PATIENT ID: Sierra [name]  MRN: [id number]  DOB/AGE:  1946-05-03 / 69 y.o.  2 Days Post-Op Procedure(s) (LRB): LEFT TOTAL HIP REVISION (Left)    PROGRESS NOTE Subjective: Patient is alert, oriented,no Nausea, no Vomiting, yes passing gas, no Bowel Movement. Taking PO well. Denies SOB, Chest or Calf Pain. Using Incentive Spirometer, PAS in place. Ambulate WBAT Patient reports pain as mild  .    Objective: Vital signs in last 24 hours: Filed Vitals:   09/20/13 2048 09/21/13 0000 09/21/13 0400 09/21/13 0621  BP: 123/46   136/46  Pulse: 70   63  Temp: 97.9 F (36.6 C)   98.7 F (37.1 C)  TempSrc:      Resp: 18 16 17 18   Height:      Weight:      SpO2: 99%   99%      Intake/Output from previous day: I/O last 3 completed shifts: In: 2710 [P.O.:960; I.V.:1750] Out: -    Intake/Output this shift:     LABORATORY DATA:  Recent Labs  09/20/13 0735 09/21/13 0453  WBC 10.9* 8.6  HGB 10.7* 9.4*  HCT 32.2* 28.4*  PLT 218 197  NA 140  --   K 4.8  --   CL 102  --   CO2 23  --   BUN 21  --   CREATININE 0.60  --   GLUCOSE 135*  --   CALCIUM 8.3*  --     Examination: Neurologically intact Neurovascular intact Sensation intact distally Intact pulses distally Dorsiflexion/Plantar flexion intact Incision: dressing C/D/I No cellulitis present Compartment soft} XR AP&Lat of hip shows well placed\fixed THA  Assessment:   2 Days Post-Op Procedure(s) (LRB): LEFT TOTAL HIP REVISION (Left) ADDITIONAL DIAGNOSIS:   Plan: PT/OT WBAT, THA  posterior precautions  DVT Prophylaxis: SCDx72 hrs, ASA 325 mg BID x 2 weeks  DISCHARGE PLAN: Home, when pt passes PT goals.  DISCHARGE NEEDS: HHPT, HHRN, Walker and 3-in-1 comode seat

## 2013-09-21 NOTE — Progress Notes (Signed)
Occupational Therapy Treatment Patient Details Name: Sierra [name] MRN: [id number] DOB: [birth year] Today's Date: 09/21/2013    History of present illness Adm for Lt total hip revision (originally done 2009 and RTHR 2011) .  Of note, since 4/2 pt. has been sleepier, has had buckling of legs and "giving " of arms to where she drops cups filled with liquids.   OT comments  Pt making progress with functional goals ans should continue with acute OT services to increase level of function and safety. Pt requires +2 physical assist for safety and demonstrates LE buckling and B UE twitching/loss of grip and grasp  Follow Up Recommendations  Home health OT;Supervision/Assistance - 24 hour . If pt not improving enough, may need ST SNF   Equipment Recommendations  None recommended by OT    Recommendations for Other Services      Precautions / Restrictions Precautions Precautions: Fall;Posterior Hip Precaution Comments: reviewed posterior hip precautions; pt. able to state 2/3 precautions and needs reinforcement of observing precautions in functional mobility Restrictions Weight Bearing Restrictions: Yes LLE Weight Bearing: Weight bearing as tolerated       Mobility Bed Mobility Overal bed mobility:  (not assessed; wants to remain in recliner chair)                Transfers Overall transfer level: Needs assistance Equipment used: Rolling walker (2 wheeled) Transfers: Sit to/from Stand Sit to Stand: +2 physical assistance;Mod assist         General transfer comment: assist to rise to stand and to control descent; LEs seem to buckle and give part way down to chair    Balance                                   ADL       Grooming: Wash/dry hands;Wash/dry face;Total assistance;Standing Grooming Details (indicate cue type and reason): +2 for physical assist/safety     Lower Body Bathing: Sit to/from stand;Maximal assistance;Sitting/lateral leans   Upper Body  Dressing : Supervision/safety;Set up   Lower Body Dressing: Total assistance   Toilet Transfer: Moderate assistance;+2 for physical assistance;+2 for safety/equipment;BSC;RW Toilet Transfer Details (indicate cue type and reason): assist to rise to stand and to control descent Toileting- Clothing Manipulation and Hygiene: Total assistance;Sit to/from stand         General ADL Comments: pt requires +2 for physical assist/safety for LB ADLs and ADL mobility      Vision  wear glasses at all times                              Cognition   Behavior During Therapy: [facility] for tasks assessed/performed Overall Cognitive Status: Within Functional Limits for tasks assessed                                                General Comments  Pt pleasant and cooperative    Pertinent Vitals/ Pain       3/10 L hip pain, VSS                       Bathroom Shower/Tub: Occupational psychologist: Handicapped height     Home Equipment: Shower seat;Toilet riser;Walker -  2 wheels;Cane - single point Adaptive Equipment: Reacher;Sock aid;Long-handled shoe horn        Prior Functioning/Environment Level of Independence: Independent            Frequency Min 3X/week     Progress Toward Goals  OT Goals(current goals can now be found in the care plan section)  Progress towards OT goals: Progressing toward goals  Acute Rehab OT Goals Patient Stated Goal: return home with husband ADL Goals Pt Will Perform Grooming: with max assist;with mod assist;standing Pt Will Perform Lower Body Bathing: with max assist;with mod assist;with caregiver independent in assisting;sit to/from stand Pt Will Perform Lower Body Dressing: with max assist;with mod assist;sit to/from stand;with caregiver independent in assisting Pt Will Transfer to Toilet: with mod assist;ambulating;regular height toilet;bedside commode;grab bars (3 in 1 over toilet) Pt Will Perform  Toileting - Clothing Manipulation and hygiene: with max assist;with mod assist;sitting/lateral leans;sit to/from stand;with caregiver independent in assisting Pt Will Perform Tub/Shower Transfer: with mod assist;shower seat  Plan Discharge plan remains appropriate                     End of Session Equipment Utilized During Treatment: Gait belt;Rolling walker;Other (comment) (BSC)   Activity Tolerance Patient tolerated treatment well;Patient limited by pain   Patient Left in bed;with call bell/phone within reach   Nurse Communication Mobility status        Time: 1301-1340 OT Time Calculation (min): 39 min  Charges: OT General Charges $OT Visit: 1 Procedure OT Treatments $Self Care/Home Management : 23-37 mins $Therapeutic Activity: 8-22 mins  Britt Bottom 09/21/2013, 3:12 PM

## 2013-09-21 NOTE — Progress Notes (Signed)
Physical Therapy Treatment Patient Details Name: Sierra [name] MRN: [id number] DOB: [birth year] Today's Date: 09/21/2013    History of Present Illness Adm for Lt total hip revision (originally done 2009 and RTHR 2011)     PT Comments    Pt. Is weak today and her knees buckled upon initially standing.  As of this session, she has not met goals that would allow home Dc safely.  Will see a second session.  Mentioned to patient and to CM, SW that we many need to explore SNF rehab if she doesn't progress significantly over next session or two.    Follow Up Recommendations  Home health PT;Supervision/Assistance - 24 hour;Supervision for mobility/OOB (pt. may need SNF for rehab if she doesn't progress)     Equipment Recommendations  None recommended by PT    Recommendations for Other Services OT consult     Precautions / Restrictions Precautions Precautions: Fall;Posterior Hip Restrictions Weight Bearing Restrictions: Yes LLE Weight Bearing: Weight bearing as tolerated    Mobility  Bed Mobility Overal bed mobility:  (not atested; pt. up in recliner)                Transfers Overall transfer level: Needs assistance Equipment used: Rolling walker (2 wheeled) Transfers: Sit to/from Stand Sit to Stand: +2 physical assistance;Mod assist         General transfer comment: assist to power up to stand, verbal cues for technique; 2 trials to stand; pt's knees buckled on first attempt and needed mod assist of 2 to prevent fall  Ambulation/Gait Ambulation/Gait assistance: +2 physical assistance;Mod assist Ambulation Distance (Feet): 2 Feet Assistive device: Rolling walker (2 wheeled) Gait Pattern/deviations: Step-to pattern;Decreased step length - right;Decreased step length - left     General Gait Details: Pt.only albe to tolerate 2 steps due to feeling weak and knees buckling on initial stand.  Pt. assisted into recliner which was kept very close to pt. during  mobility   Stairs            Wheelchair Mobility    Modified Rankin (Stroke Patients Only)       Balance                                    Cognition Arousal/Alertness: Lethargic (RN reports only tylenol recently.) Behavior During Therapy: WFL for tasks assessed/performed Overall Cognitive Status: Within Functional Limits for tasks assessed (when awake)                      Exercises Total Joint Exercises Ankle Circles/Pumps: AROM;Both;10 reps Quad Sets: AROM;Both;10 reps Hip ABduction/ADduction: AAROM;Left;10 reps Long Arc Quad: AROM;Left;10 reps;Seated    General Comments        Pertinent Vitals/Pain See vitals tab     Home Living                      Prior Function            PT Goals (current goals can now be found in the care plan section) Progress towards PT goals: Progressing toward goals    Frequency  7X/week    PT Plan Current plan remains appropriate;Other (comment) (as long as pt. progresses adequately)    Co-evaluation             End of Session Equipment Utilized During Treatment: Gait belt Activity Tolerance: Patient limited by fatigue Patient  left: in chair;with call bell/phone within reach     Time: 0834-0857 PT Time Calculation (min): 23 min  Charges:  $Gait Training: 8-22 mins $Therapeutic Exercise: 8-22 mins                    G Codes:      Ladona Ridgel 09/21/2013, 11:42 AM Gerlean Ren PT Acute Rehab Services [phone] [place] [phone]

## 2013-09-21 NOTE — Progress Notes (Addendum)
Physical Therapy Treatment Patient Details Name: Sierra [name] MRN: [id number] DOB: May 15, 1946 Today's Date: 09/21/2013    History of Present Illness Adm for Lt total hip revision (originally done 2009 and RTHR 2011) .  Of note, since 4/2 pt. has been sleepier, has had buckling of legs and "giving " of arms to where she drops cups filled with liquids.    PT Comments    Pt. Continues to display bilateral  LE bucking with weight bearing. Does not appear to be a localized weakness due to hr hip surgery, seems to be more global.  She and the RN report that she "drops"  Cups with liquids in them.  Cautioned pt. Not to attempt to mobilize without assist.  Also discussed with RN/NT that pt. Currently requires 2 assist as a  Safety measure due to her legs buckling.  Pt. And husband voice understanding and are in agreement of this precaution.     Follow Up Recommendations  Home health PT;Supervision/Assistance - 24 hour;Supervision for mobility/OOB     Equipment Recommendations  None recommended by PT    Recommendations for Other Services OT consult     Precautions / Restrictions Precautions Precautions: Fall;Posterior Hip Precaution Comments: reviewed posterior hip precautions; pt. able to state 2/3 precautions and needs reinforcement of observing precautions in functional mobility Restrictions Weight Bearing Restrictions: Yes LLE Weight Bearing: Weight bearing as tolerated    Mobility  Bed Mobility Overal bed mobility:  (not assessed; wants to remain in recliner chair)                Transfers Overall transfer level: Needs assistance Equipment used: Rolling walker (2 wheeled) Transfers: Sit to/from Stand Sit to Stand: +2 physical assistance;Mod assist         General transfer comment: assist to rise to stand and to control descent; LEs seem to buckle and give part way down to chair  Ambulation/Gait Ambulation/Gait assistance: +2 physical assistance;Mod  assist Ambulation Distance (Feet): 4 Feet (2' to 3n1, 2' back to recliner)Pt. Positive for urination in 3n1.  NT made aware. Assistive device: Rolling walker (2 wheeled) Gait Pattern/deviations: Step-to pattern     General Gait Details: Pt. advances LEs slowly and with increased difficulty.  Takes only a couple steps and does not feel like her legs are "trustworthy".     Stairs            Wheelchair Mobility    Modified Rankin (Stroke Patients Only)       Balance                                    Cognition Arousal/Alertness: Awake/alert Behavior During Therapy: WFL for tasks assessed/performed Overall Cognitive Status: Within Functional Limits for tasks assessed                         General Comments        Pertinent Vitals/Pain See vitals tab Pain appears well managed for PT tasks    Home Living                      Prior Function            PT Goals (current goals can now be found in the care plan section) Progress towards PT goals: Progressing toward goals    Frequency  7X/week    PT Plan Current  plan remains appropriate;Other (comment) (as long as she can progress adequately)    Co-evaluation             End of Session Equipment Utilized During Treatment: Gait belt Activity Tolerance: Patient limited by fatigue Patient left: in chair;with call bell/phone within reach     Time: 1151-1214 PT Time Calculation (min): 23 min  Charges:  $Gait Training: 8-22 mins $Therapeutic Exercise: 8-22 mins $Therapeutic Activity: 8-22 mins                    G Codes:      Ladona Ridgel 09/21/2013, 12:25 PM Gerlean Ren PT Acute Rehab Services [place] [phone]

## 2013-09-21 NOTE — Progress Notes (Signed)
MD on call notified re pt's weakness and drowsiness.  Improvement has been seen since this am.  Able with two assist to ambulate from bedside commode to bed, still dropped the phone during a call.  Will reassess in two hours to see if improvement continues.

## 2013-09-21 NOTE — Consult Note (Signed)
[facility] Medical Consultation  Sierra [name] KVQ:[id number] DOB: 05-May-1946 DOA: 09/19/2013 PCP: Horton Finer, MD   Requesting physician: Dr. Mayer Camel Date of consultation:4/3 Reason for consultation: Lethargy and weakness  Impression/Recommendations   Active Problems: 1.Altered mental status/weakness -Anesthesia and meds/narcotics vs infection vsCVA in differential but less likely given her lack of focal neurologic findings on exam -Have her decrease her Dilaudid dose and frequency -Will obtain UA and culture>> follow and treat as appropriate pending results. She has no cough, no SOB and is afebrile so will hold off chest x-ray at this time -I recommended a CT of head >>discussed with both patient and husband and due to their concerns about radiation they would rather hold off CT/imaging studies at this time but would like to reconsider this decision in the a.m. if she is not better or worsening. 2. Hypertension -Good BP control, continue current meds. 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 Please contact me if I can be of assistance in the meanwhile. Thank you for this consultation.  Chief Complaint: weakness-objects dropping from hands this a.m.  HPI:  The patient is a 68 year old female with history of hypertension and arthritis admitted to the orthopedic service on 4/1 for  Left total hip revision>> done on 4/2 per Dr. Mayer Camel. Patient was noted to be lethargic this a.m. and reports that she was holding a cup and it just dropped from her hands. She is more alert this p.m. and at time of my visit oriented x3 and conversant. Nursing staff reports that her weakness and drowsiness is much improved this p.m. Patient denies slurred speech, focal weakness, dysphagia, cough, chest pain, shortness of breath. She also denies dysuria, but states and she was recently treated for a UTI with Cipro for  3 days.   Review of Systems:  The patient denies anorexia, fever, weight loss,, vision loss, decreased hearing, hoarseness, chest pain, syncope, dyspnea on exertion, peripheral edema, hemoptysis, abdominal pain, melena, hematochezia, severe indigestion/heartburn, hematuria, incontinence, genital sores, suspicious skin lesions, transient blindness or there were no, depression, unusual weight change, abnormal bleeding, enlarged lymph nodes, angioedema, and breast masses.   Past Medical History  Diagnosis Date  . Hypertension   . Anxiety   . GERD (gastroesophageal reflux disease)   . H/O hiatal hernia   . Pneumonia ~ 2012  . Migraine     "haven't had any in a long time" (09/19/2013)  . Arthritis     "joints" (09/19/2013)  . Chronic lower back pain   . Basal cell carcinoma of cheek     left   Past Surgical History  Procedure Laterality Date  . Dilation and curettage of uterus    . Cholecystectomy  1990's  . Hernia repair  6433    umbilical  . Shoulder arthroscopy w/ rotator cuff repair Right   . Tmj arthroplasty    . Oophorectomy Bilateral   . Joint replacement    . Revision total hip arthroplasty Left 09/19/2013  . Total hip arthroplasty Left 2009  . Total hip arthroplasty Right 2011  . Vaginal hysterectomy  1990's  . Tubal ligation    . Basal cell carcinoma excision Left     "cheek"  . Total hip revision Left 09/19/2013    Procedure: LEFT TOTAL HIP REVISION;  Surgeon: Kerin Salen, MD;  Location: [facility];  Service: Orthopedics;  Laterality: Left;   Social  History:  reports that she has never smoked. She has never used smokeless tobacco. She reports that she drinks alcohol. She reports that she does not use illicit drugs.  Allergies  Allergen Reactions  . Ace Inhibitors Cough  . Codeine Nausea Only  . Erythromycin     Stomach pain  . Iohexol      Code: HIVES, Desc: patient states develops hives w/ iv contrast/mms    History reviewed. No pertinent family history.  Prior to  Admission medications   Medication Sig Start Date End Date Taking? Authorizing Provider  amLODipine (NORVASC) 10 MG tablet Take 10 mg by mouth daily.   Yes Historical Provider, MD  Bioflavonoid Products (ESTER C PO) Take 500 mg by mouth daily.   Yes Historical Provider, MD  Cholecalciferol (VITAMIN D-3) 5000 UNITS TABS Take 5,000 Units by mouth daily.   Yes Historical Provider, MD  Cinnamon 500 MG TABS Take 500 mg by mouth daily.   Yes Historical Provider, MD  citalopram (CELEXA) 40 MG tablet Take 40 mg by mouth daily.   Yes Historical Provider, MD  colestipol (COLESTID) 5 G granules Take 5 g by mouth 2 (two) times daily.   Yes Historical Provider, MD  diphenhydrAMINE (BENADRYL) 25 mg capsule Take 25 mg by mouth at bedtime as needed for sleep.   Yes Historical Provider, MD  gabapentin (NEURONTIN) 600 MG tablet Take 600 mg by mouth 3 (three) times daily.   Yes Historical Provider, MD  glucosamine-chondroitin 500-400 MG tablet Take 1 tablet by mouth 3 (three) times daily.   Yes Historical Provider, MD  hydrochlorothiazide (HYDRODIURIL) 25 MG tablet Take 25 mg by mouth daily.   Yes Historical Provider, MD  metoprolol succinate (TOPROL-XL) 50 MG 24 hr tablet Take 50 mg by mouth daily. Take with or immediately following a meal.   Yes Historical Provider, MD  Multiple Vitamins-Minerals (MULTIVITAMIN WITH MINERALS) tablet Take 1 tablet by mouth daily.   Yes Historical Provider, MD  nabumetone (RELAFEN) 500 MG tablet Take 500 mg by mouth 2 (two) times daily.   Yes Historical Provider, MD  olmesartan (BENICAR) 20 MG tablet Take 20 mg by mouth daily.   Yes Historical Provider, MD  RABEprazole (ACIPHEX) 20 MG tablet Take 20 mg by mouth daily. Except take 20 mg twice a day on Monday,wed, & fridays   Yes Historical Provider, MD  terazosin (HYTRIN) 10 MG capsule Take 10 mg by mouth at bedtime.   Yes Historical Provider, MD  vitamin B-12 (CYANOCOBALAMIN) 500 MCG tablet Take 500 mcg by mouth daily.   Yes  Historical Provider, MD  aspirin EC 325 MG tablet Take 1 tablet (325 mg total) by mouth 2 (two) times daily. 09/19/13   Leighton Parody, PA-C  methocarbamol (ROBAXIN) 500 MG tablet Take 1 tablet (500 mg total) by mouth 2 (two) times daily with a meal. 09/19/13   Leighton Parody, PA-C  oxyCODONE-acetaminophen (ROXICET) 5-325 MG per tablet Take 1 tablet by mouth every 4 (four) hours as needed. 09/19/13   Leighton Parody, PA-C   Physical Exam: Blood pressure 116/43, pulse 68, temperature 99.8 F (37.7 C), temperature source Oral, resp. rate 18, height 5\' 7"  (1.702 m), weight 86.637 kg (191 lb), SpO2 94.00%. Filed Vitals:   09/21/13 1700  BP: 116/43  Pulse: 68  Temp: 99.8 F (37.7 C)  Resp: 18    Constitutional: Vital signs reviewed.  Patient is a well-developed and well-nourished  in no acute distress and cooperative with exam. Alert and  oriented x3.  Head: Normocephalic and atraumatic, no facial asymmetry Eyes: PERRL, EOMI, conjunctivae normal, No scleral icterus.  Neck: Supple, Trachea midline normal ROM, No JVD, mass, thyromegaly, or carotid bruit present.  Cardiovascular: RRR, S1 normal, S2 normal, no MRG, pulses symmetric and intact bilaterally Pulmonary/Chest: normal respiratory effort, CTAB, no wheezes, rales, or rhonchi Abdominal: Soft. Non-tender, non-distended, bowel sounds are normal, no masses, organomegaly, or guarding present.  GU: no CVA tenderness Extremities: No cyanosis and no edema Neurological: A&O x3, Strength is normal and symmetric bilaterally, cranial nerve II-XII are grossly intact, no focal motor deficit, sensory intact to light touch bilaterally.  Skin: Warm, dry and intact. No rash, cyanosis, or clubbing.  Psychiatric: Normal mood and affect. speech and behavior is normal.  Labs on Admission:  Basic Metabolic Panel:  Recent Labs Lab 09/20/13 0735  NA 140  K 4.8  CL 102  CO2 23  GLUCOSE 135*  BUN 21  CREATININE 0.60  CALCIUM 8.3*   Liver Function  Tests: No results found for this basename: AST, ALT, ALKPHOS, BILITOT, PROT, ALBUMIN,  in the last 168 hours No results found for this basename: LIPASE, AMYLASE,  in the last 168 hours No results found for this basename: AMMONIA,  in the last 168 hours CBC:  Recent Labs Lab 09/20/13 0735 09/21/13 0453  WBC 10.9* 8.6  HGB 10.7* 9.4*  HCT 32.2* 28.4*  MCV 93.1 95.0  PLT 218 197   Cardiac Enzymes: No results found for this basename: CKTOTAL, CKMB, CKMBINDEX, TROPONINI,  in the last 168 hours BNP: No components found with this basename: POCBNP,  CBG: No results found for this basename: GLUCAP,  in the last 168 hours  Radiological Exams on Admission: No results found.    Time spent: >30MINS  Sheila Oats [facility] Pager [phone]  If 7PM-7AM, please contact night-coverage [email or web address] Password [facility] 09/21/2013, 6:54 PM

## 2013-09-22 LAB — TISSUE CULTURE
Culture: NO GROWTH
GRAM STAIN: NONE SEEN

## 2013-09-22 LAB — BASIC METABOLIC PANEL
BUN: 43 mg/dL — ABNORMAL HIGH (ref 6–23)
CHLORIDE: 101 meq/L (ref 96–112)
CO2: 24 mEq/L (ref 19–32)
Calcium: 8.3 mg/dL — ABNORMAL LOW (ref 8.4–10.5)
Creatinine, Ser: 0.95 mg/dL (ref 0.50–1.10)
GFR calc non Af Amer: 60 mL/min — ABNORMAL LOW (ref >90)
GFR, EST AFRICAN AMERICAN: 69 mL/min — AB (ref >90)
Glucose, Bld: 101 mg/dL — ABNORMAL HIGH (ref 70–99)
Potassium: 4 mEq/L (ref 3.7–5.3)
SODIUM: 140 meq/L (ref 137–147)

## 2013-09-22 LAB — CBC
HCT: 28.4 % — ABNORMAL LOW (ref 36.0–46.0)
Hemoglobin: 9.4 g/dL — ABNORMAL LOW (ref 12.0–15.0)
MCH: 30.8 pg (ref 26.0–34.0)
MCHC: 33.1 g/dL (ref 30.0–36.0)
MCV: 93.1 fL (ref 78.0–100.0)
PLATELETS: 189 10*3/uL (ref 150–400)
RBC: 3.05 MIL/uL — ABNORMAL LOW (ref 3.87–5.11)
RDW: 14.1 % (ref 11.5–15.5)
WBC: 5.9 10*3/uL (ref 4.0–10.5)

## 2013-09-22 LAB — URINALYSIS, ROUTINE W REFLEX MICROSCOPIC
BILIRUBIN URINE: NEGATIVE
Glucose, UA: NEGATIVE mg/dL
Hgb urine dipstick: NEGATIVE
KETONES UR: NEGATIVE mg/dL
NITRITE: NEGATIVE
PROTEIN: NEGATIVE mg/dL
Specific Gravity, Urine: 1.017 (ref 1.005–1.030)
UROBILINOGEN UA: 0.2 mg/dL (ref 0.0–1.0)
pH: 5 (ref 5.0–8.0)

## 2013-09-22 LAB — URINE MICROSCOPIC-ADD ON

## 2013-09-22 MED ORDER — DEXTROSE 5 % IV SOLN
1.0000 g | INTRAVENOUS | Status: DC
  Administered 2013-09-22 – 2013-09-23 (×2): 1 g via INTRAVENOUS
  Filled 2013-09-22 (×2): qty 10

## 2013-09-22 NOTE — Progress Notes (Signed)
PATIENT ID: Sierra [name]  MRN: [id number]  DOB/AGE:  1945-12-23 / 68 y.o.  3 Days Post-Op Procedure(s) (LRB): LEFT TOTAL HIP REVISION (Left)    PROGRESS NOTE Subjective: Patient is alert, oriented,no Nausea, no Vomiting, yes passing gas, no Bowel Movement. Taking PO well. Denies SOB, Chest or Calf Pain. Using Incentive Spirometer, PAS in place. Ambulate WBAt Patient reports pain as mild  .    Objective: Vital signs in last 24 hours: Filed Vitals:   09/21/13 2102 09/22/13 0000 09/22/13 0322 09/22/13 0529  BP: 118/35   145/53  Pulse: 73   83  Temp: 98.2 F (36.8 C)   98.3 F (36.8 C)  TempSrc: Oral   Oral  Resp: 16 16 16 12   Height:      Weight:      SpO2: 91% 92% 92% 92%      Intake/Output from previous day: I/O last 3 completed shifts: In: 1080 [P.O.:1080] Out: 750 [Urine:750]   Intake/Output this shift: Total I/O In: -  Out: 650 [Urine:650]   LABORATORY DATA:  Recent Labs  09/20/13 0735 09/21/13 0453 09/22/13 0455  WBC 10.9* 8.6 5.9  HGB 10.7* 9.4* 9.4*  HCT 32.2* 28.4* 28.4*  PLT 218 197 189  NA 140  --  140  K 4.8  --  4.0  CL 102  --  101  CO2 23  --  24  BUN 21  --  43*  CREATININE 0.60  --  0.95  GLUCOSE 135*  --  101*  CALCIUM 8.3*  --  8.3*    Examination: Neurologically intact ABD soft Neurovascular intact Sensation intact distally Intact pulses distally Dorsiflexion/Plantar flexion intact Incision: dressing C/D/I and no drainage No cellulitis present Compartment soft} XR AP&Lat of hip shows well placed\fixed THA  Assessment:   3 Days Post-Op Procedure(s) (LRB): LEFT TOTAL HIP REVISION (Left) ADDITIONAL DIAGNOSIS:    Plan: PT/OT WBAT, THA  posterior precautions  DVT Prophylaxis: SCDx72 hrs, ASA 325 mg BID x 2 weeks  DISCHARGE PLAN: Home, later today if pt meets PT goals  DISCHARGE NEEDS: HHPT, HHRN, Walker and 3-in-1 comode seat

## 2013-09-22 NOTE — Progress Notes (Signed)
Physical Therapy Treatment Patient Details Name: Sierra [name] MRN: [id number] DOB: 07/13/1945 Today's Date: 09/22/2013    History of Present Illness Adm for Lt total hip revision (originally done 2009 and RTHR 2011) .  Of note, since 4/2 pt. has been sleepier, has had buckling of legs and "giving " of arms to where she drops cups filled with liquids - this now seems resolved.    PT Comments    Patient better today - no UE weakness, dropping items or knee buckling.  However, patient still requires increased assistance for mobility and only able to ambulate short distances.  Patient also ambulating with extremely decreased gait velocity which indicates a fall risk.  Do not feel patient ready for d/c today from PT standpoint.  Feel she needs further PT to increase safety/mobility.   Follow Up Recommendations  Home health PT;Supervision/Assistance - 24 hour;Supervision for mobility/OOB     Equipment Recommendations  None recommended by PT    Recommendations for Other Services       Precautions / Restrictions Precautions Precautions: Fall;Posterior Hip Precaution Comments: reviewed posterior hip precautions; pt. able to state 2/3 precautions and needs reinforcement of observing precautions in functional mobility Restrictions Weight Bearing Restrictions: Yes LLE Weight Bearing: Weight bearing as tolerated    Mobility  Bed Mobility Overal bed mobility: Needs Assistance Bed Mobility: Sit to Supine       Sit to supine: Min assist   General bed mobility comments: assist for LE and to raise shoulders off bed  Transfers Overall transfer level: Needs assistance Equipment used: Rolling walker (2 wheeled) Transfers: Sit to/from Stand Sit to Stand: Mod assist         General transfer comment: assist to rise to stand and to control descent;   Ambulation/Gait Ambulation/Gait assistance: Min assist Ambulation Distance (Feet): 15 Feet Assistive device: Rolling walker (2  wheeled) Gait Pattern/deviations: Step-to pattern Gait velocity: 0.08 ft/sec Gait velocity interpretation: Below normal speed for age/gender General Gait Details: Pt. advances LEs slowly and with increased difficulty.    Stairs            Wheelchair Mobility    Modified Rankin (Stroke Patients Only)       Balance   Sitting-balance support: No upper extremity supported Sitting balance-Leahy Scale: Good     Standing balance support: Bilateral upper extremity supported Standing balance-Leahy Scale: Poor                      Cognition Arousal/Alertness: Awake/alert Behavior During Therapy: WFL for tasks assessed/performed Overall Cognitive Status: Within Functional Limits for tasks assessed                      Exercises      General Comments        Pertinent Vitals/Pain No pain indicated.    Home Living                      Prior Function            PT Goals (current goals can now be found in the care plan section) Progress towards PT goals: Progressing toward goals    Frequency  7X/week    PT Plan Current plan remains appropriate;Other (comment)    Co-evaluation             End of Session Equipment Utilized During Treatment: Gait belt Activity Tolerance: Patient limited by fatigue Patient left: in chair;with call bell/phone  within reach     Time: 1012-1040 PT Time Calculation (min): 28 min  Charges:  $Gait Training: 23-37 mins                    G CodesShanna Cisco, [place] [phone] 09/22/2013, 11:04 AM

## 2013-09-22 NOTE — Progress Notes (Signed)
Physical Therapy Treatment Patient Details Name: Sierra [name] MRN: [id number] DOB: 1946/04/30 Today's Date: 09/22/2013    History of Present Illness Adm for Lt total hip revision (originally done 2009 and RTHR 2011) .  Of note, since 4/2 pt. has been sleepier, has had buckling of legs and "giving " of arms to where she drops cups filled with liquids - this now seems resolved.    PT Comments    Patient is steadily improving with mobility and independence.  Patient still needs assistance for bed mobility and transfers, and is now supervision for gait.  Patient did practice stairs today with min assistance.  Feel patient will be ready for d/c from PT standpoint tomorrow after am PT session.    Follow Up Recommendations  Home health PT;Supervision for mobility/OOB     Equipment Recommendations  None recommended by PT    Recommendations for Other Services       Precautions / Restrictions Precautions Precautions: Fall;Posterior Hip Precaution Comments: reviewed posterior hip precautions; pt. able to state 2/3 precautions and needs reinforcement of observing precautions in functional mobility Restrictions LLE Weight Bearing: Weight bearing as tolerated    Mobility  Bed Mobility Overal bed mobility: Needs Assistance Bed Mobility: Sit to Supine       Sit to supine: Min guard   General bed mobility comments: assist for LE's  Transfers Overall transfer level: Needs assistance Equipment used: Rolling walker (2 wheeled) Transfers: Sit to/from Stand Sit to Stand: Min assist         General transfer comment: assist to rise to stand and to control descent;   Ambulation/Gait Ambulation/Gait assistance: Supervision Ambulation Distance (Feet): 15 Feet (x 2) Assistive device: Rolling walker (2 wheeled) Gait Pattern/deviations: Step-to pattern Gait velocity: 0.08 ft/sec Gait velocity interpretation: Below normal speed for age/gender General Gait Details: Pt. advances LEs slowly  and with increased difficulty.    Stairs Stairs: Yes Stairs assistance: Min assist Stair Management: No rails;Backwards;With walker Number of Stairs: 3 General stair comments: assist to hold walker on stairs  Wheelchair Mobility    Modified Rankin (Stroke Patients Only)       Balance   Sitting-balance support: No upper extremity supported Sitting balance-Leahy Scale: Good     Standing balance support: Bilateral upper extremity supported Standing balance-Leahy Scale: Poor                      Cognition Arousal/Alertness: Awake/alert Behavior During Therapy: WFL for tasks assessed/performed Overall Cognitive Status: Within Functional Limits for tasks assessed                      Exercises Total Joint Exercises Ankle Circles/Pumps: AROM;Both;10 reps Quad Sets: AROM;Both;10 reps Short Arc Quad: AROM;Left;10 reps Hip ABduction/ADduction: AAROM;Left;10 reps    General Comments        Pertinent Vitals/Pain 5/10.  Patient unable to have pain meds.  Monitored during activity.    Home Living                      Prior Function            PT Goals (current goals can now be found in the care plan section) Progress towards PT goals: Progressing toward goals    Frequency  7X/week    PT Plan Current plan remains appropriate    Co-evaluation             End of Session Equipment Utilized During  Treatment: Gait belt Activity Tolerance: Patient limited by fatigue Patient left: in bed;with call bell/phone within reach     Time: 1315-1354 PT Time Calculation (min): 39 min  Charges:  $Gait Training: 23-37 mins                    G CodesShanna Cisco, [place] 09/22/2013, 1:57 PM

## 2013-09-22 NOTE — Progress Notes (Signed)
OT Cancellation Note  Patient Details Name: Sierra [name] MRN: [id number] DOB: [birth year]   Cancelled Treatment:    Reason Eval/Treat Not Completed: Patient declined, no reason specified. OT made visit this date to address concerns with UE weakness. Pt reports that UE coordination problem and knee buckling has resolved. Per PT note, pt improving with mobility and recommending HHPT. Opportunity provided to pt to practice shower transfer, however pt declined. Education provided for shower transfer technique and provided handout with step-by-step instructions. OT to follow-up prior to discharge to address any further ADL concerns.   Juluis Rainier [phone] 09/22/2013, 3:30 PM

## 2013-09-22 NOTE — Progress Notes (Signed)
Subjective: Patient is very clear and oriented today.  No further episodes of change in mental status or other abnormal neurological symptoms.  Objective: Weight change:   Intake/Output Summary (Last 24 hours) at 09/22/13 0852 Last data filed at 09/22/13 0813  Gross per 24 hour  Intake    480 ml  Output   1400 ml  Net   -920 ml   Filed Vitals:   09/21/13 2102 09/22/13 0000 09/22/13 0322 09/22/13 0529  BP: 118/35   145/53  Pulse: 73   83  Temp: 98.2 F (36.8 C)   98.3 F (36.8 C)  TempSrc: Oral   Oral  Resp: _0 Height:      Weight:      SpO2: 91% 92% 92% 92%    General Appearance: Alert, cooperative, no distress, appears stated age Lungs: Clear to auscultation bilaterally, respirations unlabored Heart: Regular rate and rhythm, S1 and S2 normal, no murmur, rub or gallop Abdomen: Soft, non-tender, bowel sounds active all four quadrants, no masses, no organomegaly Extremities: Extremities normal, atraumatic, no cyanosis or edema Neuro: Alert and oriented x3, moves all extremities well except for decreased movement of her left lower extremity secondary to surgery.  Nonfocal exam.  Lab Results: Results for orders placed during the hospital encounter of 09/19/13 (from the past 48 hour(s))  CBC     Status: Abnormal   Collection Time    09/21/13  4:53 AM      Result Value Ref Range   WBC 8.6  4.0 - 10.5 K/uL   RBC 2.99 (*) 3.87 - 5.11 MIL/uL   Hemoglobin 9.4 (*) 12.0 - 15.0 g/dL   HCT 28.4 (*) 36.0 - 46.0 %   MCV 95.0  78.0 - 100.0 fL   MCH 31.4  26.0 - 34.0 pg   MCHC 33.1  30.0 - 36.0 g/dL   RDW 14.6  11.5 - 15.5 %   Platelets 197  150 - 400 K/uL  URINALYSIS, ROUTINE W REFLEX MICROSCOPIC     Status: Abnormal   Collection Time    09/22/13  1:19 AM      Result Value Ref Range   Color, Urine YELLOW  YELLOW   APPearance CLEAR  CLEAR   Specific Gravity, Urine 1.017  1.005 - 1.030   pH 5.0  5.0 - 8.0   Glucose, UA NEGATIVE  NEGATIVE mg/dL   Hgb urine dipstick  NEGATIVE  NEGATIVE   Bilirubin Urine NEGATIVE  NEGATIVE   Ketones, ur NEGATIVE  NEGATIVE mg/dL   Protein, ur NEGATIVE  NEGATIVE mg/dL   Urobilinogen, UA 0.2  0.0 - 1.0 mg/dL   Nitrite NEGATIVE  NEGATIVE   Leukocytes, UA SMALL (*) NEGATIVE  URINE MICROSCOPIC-ADD ON     Status: Abnormal   Collection Time    09/22/13  1:19 AM      Result Value Ref Range   Squamous Epithelial / LPF FEW (*) RARE   WBC, UA 7-10  <3 WBC/hpf   Bacteria, UA FEW (*) RARE  CBC     Status: Abnormal   Collection Time    09/22/13  4:55 AM      Result Value Ref Range   WBC 5.9  4.0 - 10.5 K/uL   RBC 3.05 (*) 3.87 - 5.11 MIL/uL   Hemoglobin 9.4 (*) 12.0 - 15.0 g/dL   HCT 28.4 (*) 36.0 - 46.0 %   MCV 93.1  78.0 - 100.0 fL   MCH 30.8  26.0 - 34.0  pg   MCHC 33.1  30.0 - 36.0 g/dL   RDW 14.1  11.5 - 15.5 %   Platelets 189  150 - 400 K/uL  BASIC METABOLIC PANEL     Status: Abnormal   Collection Time    09/22/13  4:55 AM      Result Value Ref Range   Sodium 140  137 - 147 mEq/L   Potassium 4.0  3.7 - 5.3 mEq/L   Chloride 101  96 - 112 mEq/L   CO2 24  19 - 32 mEq/L   Glucose, Bld 101 (*) 70 - 99 mg/dL   BUN 43 (*) 6 - 23 mg/dL   Comment: DELTA CHECK NOTED   Creatinine, Ser 0.95  0.50 - 1.10 mg/dL   Comment: DELTA CHECK NOTED   Calcium 8.3 (*) 8.4 - 10.5 mg/dL   GFR calc non Af Amer 60 (*) >90 mL/min   GFR calc Af Amer 69 (*) >90 mL/min   Comment: (NOTE)     The eGFR has been calculated using the CKD EPI equation.     This calculation has not been validated in all clinical situations.     eGFR's persistently <90 mL/min signify possible Chronic Kidney     Disease.    Studies/Results: No results found. Medications: Scheduled Meds: . amLODipine  10 mg Oral Daily  . aspirin EC  325 mg Oral Q breakfast  . cefTRIAXone (ROCEPHIN)  IV  1 g Intravenous Q24H  . citalopram  40 mg Oral Daily  . docusate sodium  100 mg Oral BID  . gabapentin  600 mg Oral TID  . hydrochlorothiazide  25 mg Oral Daily  .  irbesartan  150 mg Oral Daily  . metoprolol succinate  50 mg Oral Daily  . pantoprazole  40 mg Oral Daily  . terazosin  10 mg Oral QHS   Continuous Infusions: . 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  No evidence of infection or central nervous system abnormality   Hypertension - controlled at 143/53      Disposition - we'll reassess in a.m. to be sure stability       LOS: 3 days   Henrine Screws, MD 09/22/2013, 8:52 AM

## 2013-09-22 NOTE — Discharge Summary (Addendum)
Patient ID: Sierra [name] MRN: [id number] DOB/AGE: [birth year] 70 y.o.  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 Hypertension   . Anxiety   . GERD (gastroesophageal reflux disease)   . H/O hiatal hernia   . Pneumonia ~ 2012  . Migraine     "haven't had any in a long time" (09/19/2013)  . Arthritis     "joints" (09/19/2013)  . Chronic lower back pain   . Basal cell carcinoma of cheek     left    Surgeries: Procedure(s): LEFT TOTAL HIP REVISION on 09/19/2013   Consultants: Treatment Team:  Horton Finer, MD  Discharged Condition: Improved  Hospital Course: Sierra [name] is an 68 y.o. female who was admitted 09/19/2013 for operative treatment ofPain due to hip joint prosthesis. Patient has severe unremitting pain that affects sleep, daily activities, and work/hobbies. After pre-op clearance the patient was taken to the operating room on 09/19/2013 and underwent  Procedure(s): LEFT TOTAL HIP REVISION.    Patient was given perioperative antibiotics: Anti-infectives   Start     Dose/Rate Route Frequency Ordered Stop   09/22/13 0615  cefTRIAXone (ROCEPHIN) 1 g in dextrose 5 % 50 mL IVPB     1 g 100 mL/hr over 30 Minutes Intravenous Every 24 hours 09/22/13 0601     09/19/13 0600  ceFAZolin (ANCEF) IVPB 2 g/50 mL premix     2 g 100 mL/hr over 30 Minutes Intravenous On call to O.R. 09/18/13 1439 09/19/13 1315       Patient was given sequential compression devices, early ambulation, and chemoprophylaxis to prevent DVT.  Patient benefited maximally from hospital stay and there were no complications.    Recent vital signs: Patient Vitals for the past 24 hrs:  BP Temp Temp src Pulse Resp SpO2  09/22/13 0529 145/53 mmHg 98.3 F (36.8 C) Oral 83 12 92 %  09/22/13 0322 - - - - 16  92 %  09/22/13 0000 - - - - 16 92 %  09/21/13 2102 118/35 mmHg 98.2 F (36.8 C) Oral 73 16 91 %  09/21/13 2000 - - - - 16 92 %  09/21/13 1700 116/43 mmHg 99.8 F (37.7 C) - 68 18 94 %  09/21/13 1043 104/48 mmHg - - - - -  09/21/13 1042 104/48 mmHg - - - - -     Recent laboratory studies:  Recent Labs  09/20/13 0735 09/21/13 0453 09/22/13 0455  WBC 10.9* 8.6 5.9  HGB 10.7* 9.4* 9.4*  HCT 32.2* 28.4* 28.4*  PLT 218 197 189  NA 140  --  140  K 4.8  --  4.0  CL 102  --  101  CO2 23  --  24  BUN 21  --  43*  CREATININE 0.60  --  0.95  GLUCOSE 135*  --  101*  CALCIUM 8.3*  --  8.3*     Discharge Medications:     Medication List         amLODipine 10 MG tablet  Commonly known as:  NORVASC  Take 10 mg by mouth daily.     aspirin EC 325 MG tablet  Take 1 tablet (325 mg total) by mouth 2 (two) times daily.     Cinnamon 500 MG Tabs  Take  500 mg by mouth daily.     citalopram 40 MG tablet  Commonly known as:  CELEXA  Take 40 mg by mouth daily.     colestipol 5 G granules  Commonly known as:  COLESTID  Take 5 g by mouth 2 (two) times daily.     diphenhydrAMINE 25 mg capsule  Commonly known as:  BENADRYL  Take 25 mg by mouth at bedtime as needed for sleep.     ESTER C PO  Take 500 mg by mouth daily.     gabapentin 600 MG tablet  Commonly known as:  NEURONTIN  Take 600 mg by mouth 3 (three) times daily.     glucosamine-chondroitin 500-400 MG tablet  Take 1 tablet by mouth 3 (three) times daily.     hydrochlorothiazide 25 MG tablet  Commonly known as:  HYDRODIURIL  Take 25 mg by mouth daily.     methocarbamol 500 MG tablet  Commonly known as:  ROBAXIN  Take 1 tablet (500 mg total) by mouth 2 (two) times daily with a meal.     metoprolol succinate 50 MG 24 hr tablet  Commonly known as:  TOPROL-XL  Take 50 mg by mouth daily. Take with or immediately following a meal.     multivitamin with minerals tablet  Take 1 tablet by mouth daily.     nabumetone  500 MG tablet  Commonly known as:  RELAFEN  Take 500 mg by mouth 2 (two) times daily.     olmesartan 20 MG tablet  Commonly known as:  BENICAR  Take 20 mg by mouth daily.     oxyCODONE-acetaminophen 5-325 MG per tablet  Commonly known as:  ROXICET  Take 1 tablet by mouth every 4 (four) hours as needed.     RABEprazole 20 MG tablet  Commonly known as:  ACIPHEX  Take 20 mg by mouth daily. Except take 20 mg twice a day on Monday,wed, & fridays     terazosin 10 MG capsule  Commonly known as:  HYTRIN  Take 10 mg by mouth at bedtime.     vitamin B-12 500 MCG tablet  Commonly known as:  CYANOCOBALAMIN  Take 500 mcg by mouth daily.     Vitamin D-3 5000 UNITS Tabs  Take 5,000 Units by mouth daily.        Diagnostic Studies: Dg Chest 2 View  09/11/2013   CLINICAL DATA:  Preoperative for hip surgery.  Hypertension.  EXAM: CHEST  2 VIEW  COMPARISON:  January 23, 2010  FINDINGS: The heart size and mediastinal contours are stable. There is no focal infiltrate, pulmonary edema, or pleural effusion. There is a 4 mm calcified granuloma in the right lung base unchanged. The visualized skeletal structures are stable. The patient is status post prior cholecystectomy.  IMPRESSION: No active cardiopulmonary disease.   Electronically Signed   By: Abelardo Diesel M.D.   On: 09/11/2013 12:51   Dg Pelvis Portable  09/19/2013   CLINICAL DATA:  68 year old female status post hip arthroplasty. Initial encounter.  EXAM: PORTABLE PELVIS 1-2 VIEWS  COMPARISON:  Pelvis CT 03/28/2009.  FINDINGS: Portable AP supine view at 1615 hrs. Revision of the left bipolar arthroplasty hardware 2 total hip replacement hardware. Hardware components appear intact and normally aligned on this AP view. Right hip arthroplasty hardware new since 2010.  IMPRESSION: Left hip arthroplasty revision with no adverse features identified.   Electronically Signed   By: Lars Pinks M.D.   On: 09/19/2013 17:17  Disposition:       Discharge  Orders   Future Orders Complete By Expires   Call MD / Call 911  As directed    Comments:     If you experience chest pain or shortness of breath, CALL 911 and be transported to the hospital emergency room.  If you develope a fever above 101 F, pus (white drainage) or increased drainage or redness at the wound, or calf pain, call your surgeon's office.   Change dressing  As directed    Comments:     You may change your dressing on day 5, then change the dressing daily with sterile 4 x 4 inch gauze dressing and paper tape.  You may clean the incision with alcohol prior to redressing   Constipation Prevention  As directed    Comments:     Drink plenty of fluids.  Prune juice may be helpful.  You may use a stool softener, such as Colace (over the counter) 100 mg twice a day.  Use MiraLax (over the counter) for constipation as needed.   Diet - low sodium heart healthy  As directed    Discharge instructions  As directed    Comments:     Follow up in office with dr. Mayer Camel in 2 weeks.   Driving restrictions  As directed    Comments:     No driving for 2 weeks   Follow the hip precautions as taught in Physical Therapy  As directed    Increase activity slowly as tolerated  As directed    Patient may shower  As directed    Comments:     You may shower without a dressing once there is no drainage.  Do not wash over the wound.  If drainage remains, cover wound with plastic wrap and then shower.      Follow-up Information   Follow up with Kerin Salen, MD In 2 weeks.   Specialty:  Orthopedic Surgery   Contact information:   [place] [address] [phone]       Follow up with [facility]. (Someone from [facility] will contact you concerning physical therapy start date and time.)    Contact information:   [address] [place] [address] [phone]       Follow up with Kerin Salen, MD In 2 weeks.   Specialty:  Orthopedic Surgery    Contact information:   [place] [address] [phone]        Signed: Hardin Negus Odalys Win R 09/22/2013, 8:22 AM

## 2013-09-22 NOTE — Progress Notes (Signed)
Answering service for Dr Mayer Camel phoned regarding PT recommendations of holding discharge date until tomorrow.

## 2013-09-23 LAB — URINE CULTURE
Colony Count: NO GROWTH
Culture: NO GROWTH

## 2013-09-23 NOTE — Progress Notes (Signed)
Subjective: Continues to be clear and oriented and not requiring pain medications  Objective: Weight change:   Intake/Output Summary (Last 24 hours) at 09/23/13 0735 Last data filed at 09/23/13 2952  Gross per 24 hour  Intake 480.22 ml  Output    650 ml  Net -169.78 ml   Filed Vitals:   09/22/13 1200 09/22/13 1444 09/22/13 2236 09/23/13 0627  BP:  143/43 153/47 163/57  Pulse:  82 82 80  Temp:  99.2 F (37.3 C) 99.7 F (37.6 C) 98 F (36.7 C)  TempSrc:  Oral Oral Oral  Resp: '18 18 18 18  ' Height:      Weight:      SpO2: 96% 97% 96% 96%    General Appearance: Alert, cooperative, no distress, appears stated age  Lungs: Clear to auscultation bilaterally, respirations unlabored  Heart: Regular rate and rhythm, S1 and S2 normal, no murmur, rub or gallop  Abdomen: Soft, non-tender, bowel sounds active all four quadrants, no masses, no organomegaly  Extremities: Extremities normal, atraumatic, no cyanosis or edema  Neuro: Alert and oriented x3, moves all extremities well except for decreased movement of her left lower extremity secondary to surgery. Nonfocal exam.   Lab Results: Results for orders placed during the hospital encounter of 09/19/13 (from the past 48 hour(s))  URINALYSIS, ROUTINE W REFLEX MICROSCOPIC     Status: Abnormal   Collection Time    09/22/13  1:19 AM      Result Value Ref Range   Color, Urine YELLOW  YELLOW   APPearance CLEAR  CLEAR   Specific Gravity, Urine 1.017  1.005 - 1.030   pH 5.0  5.0 - 8.0   Glucose, UA NEGATIVE  NEGATIVE mg/dL   Hgb urine dipstick NEGATIVE  NEGATIVE   Bilirubin Urine NEGATIVE  NEGATIVE   Ketones, ur NEGATIVE  NEGATIVE mg/dL   Protein, ur NEGATIVE  NEGATIVE mg/dL   Urobilinogen, UA 0.2  0.0 - 1.0 mg/dL   Nitrite NEGATIVE  NEGATIVE   Leukocytes, UA SMALL (*) NEGATIVE  URINE MICROSCOPIC-ADD ON     Status: Abnormal   Collection Time    09/22/13  1:19 AM      Result Value Ref Range   Squamous Epithelial / LPF FEW (*) RARE   WBC, UA 7-10  <3 WBC/hpf   Bacteria, UA FEW (*) RARE  CBC     Status: Abnormal   Collection Time    09/22/13  4:55 AM      Result Value Ref Range   WBC 5.9  4.0 - 10.5 K/uL   RBC 3.05 (*) 3.87 - 5.11 MIL/uL   Hemoglobin 9.4 (*) 12.0 - 15.0 g/dL   HCT 28.4 (*) 36.0 - 46.0 %   MCV 93.1  78.0 - 100.0 fL   MCH 30.8  26.0 - 34.0 pg   MCHC 33.1  30.0 - 36.0 g/dL   RDW 14.1  11.5 - 15.5 %   Platelets 189  150 - 400 K/uL  BASIC METABOLIC PANEL     Status: Abnormal   Collection Time    09/22/13  4:55 AM      Result Value Ref Range   Sodium 140  137 - 147 mEq/L   Potassium 4.0  3.7 - 5.3 mEq/L   Chloride 101  96 - 112 mEq/L   CO2 24  19 - 32 mEq/L   Glucose, Bld 101 (*) 70 - 99 mg/dL   BUN 43 (*) 6 - 23 mg/dL   Comment:  DELTA CHECK NOTED   Creatinine, Ser 0.95  0.50 - 1.10 mg/dL   Comment: DELTA CHECK NOTED   Calcium 8.3 (*) 8.4 - 10.5 mg/dL   GFR calc non Af Amer 60 (*) >90 mL/min   GFR calc Af Amer 69 (*) >90 mL/min   Comment: (NOTE)     The eGFR has been calculated using the CKD EPI equation.     This calculation has not been validated in all clinical situations.     eGFR's persistently <90 mL/min signify possible Chronic Kidney     Disease.    Studies/Results: No results found. Medications: Scheduled Meds: . amLODipine  10 mg Oral Daily  . aspirin EC  325 mg Oral Q breakfast  . cefTRIAXone (ROCEPHIN)  IV  1 g Intravenous Q24H  . citalopram  40 mg Oral Daily  . docusate sodium  100 mg Oral BID  . gabapentin  600 mg Oral TID  . hydrochlorothiazide  25 mg Oral Daily  . irbesartan  150 mg Oral Daily  . metoprolol succinate  50 mg Oral Daily  . pantoprazole  40 mg Oral Daily  . terazosin  10 mg Oral QHS   Continuous Infusions: . 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 No evidence of infection or central nervous system abnormality  Hypertension - 163/57  Disposition - stable for discharge.  Will sign off.  Please notify us for further consultation if necessary    LOS: 4 days   Henrine Screws, MD 09/23/2013, 7:35 AM

## 2013-09-23 NOTE — Progress Notes (Signed)
PATIENT ID: Sierra [name]  MRN: [id number]  DOB/AGE:  [birth year] / 68 y.o.  4 Days Post-Op Procedure(s) (LRB): LEFT TOTAL HIP REVISION (Left)    PROGRESS NOTE Subjective: Patient is alert, oriented,no Nausea, no Vomiting, yes passing gas, yes Bowel Movement. Taking PO well. Denies SOB, Chest or Calf Pain. Using Incentive Spirometer, PAS in place. Ambulate WBAT Patient reports pain as 2 on 0-10 scale and 3 on 0-10 scale  .    Objective: Vital signs in last 24 hours: Filed Vitals:   09/22/13 1200 09/22/13 1444 09/22/13 2236 09/23/13 0627  BP:  143/43 153/47 163/57  Pulse:  82 82 80  Temp:  99.2 F (37.3 C) 99.7 F (37.6 C) 98 F (36.7 C)  TempSrc:  Oral Oral Oral  Resp: 18 18 18 18   Height:      Weight:      SpO2: 96% 97% 96% 96%      Intake/Output from previous day: I/O last 3 completed shifts: In: 480.2 [P.O.:480.2] Out: 650 [Urine:650]   Intake/Output this shift: Total I/O In: 240 [P.O.:240] Out: -    LABORATORY DATA:  Recent Labs  09/21/13 0453 09/22/13 0455  WBC 8.6 5.9  HGB 9.4* 9.4*  HCT 28.4* 28.4*  PLT 197 189  Sierra  --  140  K  --  4.0  CL  --  101  CO2  --  24  BUN  --  43*  CREATININE  --  0.95  GLUCOSE  --  101*  CALCIUM  --  8.3*    Examination: Neurologically intact ABD soft Neurovascular intact Sensation intact distally Intact pulses distally Dorsiflexion/Plantar flexion intact Incision: dressing C/D/I No cellulitis present Compartment soft} XR AP&Lat of hip shows well placed\fixed THA  Assessment:   4 Days Post-Op Procedure(s) (LRB): LEFT TOTAL HIP REVISION (Left) ADDITIONAL DIAGNOSIS:    Plan: PT/OT WBAT, THA  posterior precautions  DVT Prophylaxis: SCDx72 hrs, ASA 325 mg BID x 2 weeks  DISCHARGE PLAN: Home today  DISCHARGE NEEDS: HHPT, HHRN, Walker and 3-in-1 comode seat

## 2013-09-23 NOTE — Progress Notes (Signed)
Physical Therapy Treatment Patient Details Name: Sierra [name] MRN: [id number] DOB: [birth year] Today's Date: 09/23/2013    History of Present Illness Adm for Lt total hip revision (originally done 2009 and RTHR 2011) .  Of note, since 4/2 pt. has been sleepier, has had buckling of legs and "giving " of arms to where she drops cups filled with liquids - this now seems resolved.    PT Comments    OK for dc home from PT standpoint; Moving slowly, but this is pt's 3rd hip surgery, and she reports her husband is very helpful; Notified Case Mgr  Follow Up Recommendations  Home health PT;Supervision for mobility/OOB     Equipment Recommendations  None recommended by PT    Recommendations for Other Services       Precautions / Restrictions Precautions Precautions: Posterior Hip Precaution Comments: reviewed posterior hip precautions; pt. able to state 2/3 precautions and needs reinforcement of observing precautions in functional mobility Restrictions Weight Bearing Restrictions: Yes LLE Weight Bearing: Weight bearing as tolerated    Mobility  Bed Mobility Overal bed mobility: Needs Assistance Bed Mobility: Supine to Sit     Supine to sit: Min guard     General bed mobility comments: assist for LE's; Reports her husband is very helpful, and they will be able to manage  Transfers Overall transfer level: Needs assistance Equipment used: Rolling walker (2 wheeled) Transfers: Sit to/from Stand Sit to Stand: Min guard         General transfer comment: Less need for assist to rise; first rep with stand to sit, pt with uncontrolled descent; Improved second rep with step-by -step cues for hand placement and control`  Ambulation/Gait Ambulation/Gait assistance: Supervision Ambulation Distance (Feet): 45 Feet Assistive device: Rolling walker (2 wheeled) Gait Pattern/deviations: Step-to pattern   Gait velocity interpretation: Below normal speed for age/gender General Gait  Details: Short steps, moving very slowly (pt attributes this to little sleep last night), but smoother progression of stepss   Stairs Stairs: Yes Stairs assistance: Min assist Stair Management: No rails;With walker Number of Stairs: 3 General stair comments: assist to hold walker on stairs  Wheelchair Mobility    Modified Rankin (Stroke Patients Only)       Balance           Standing balance support: Bilateral upper extremity supported Standing balance-Leahy Scale: Fair Standing balance comment: Dependent on UE support                    Cognition Arousal/Alertness: Awake/alert Behavior During Therapy: WFL for tasks assessed/performed Overall Cognitive Status: Within Functional Limits for tasks assessed                      Exercises      General Comments General comments (skin integrity, edema, etc.): Discussed car transfer      Pertinent Vitals/Pain 4/10 L hip at end of session; RN informed, patient repositioned for comfort     Home Living                      Prior Function            PT Goals (current goals can now be found in the care plan section) Acute Rehab PT Goals Patient Stated Goal: return home with husband PT Goal Formulation: With patient Time For Goal Achievement: 09/23/13 Potential to Achieve Goals: Good Progress towards PT goals: Progressing toward goals    Frequency  7X/week    PT Plan Current plan remains appropriate    Co-evaluation             End of Session Equipment Utilized During Treatment: Gait belt Activity Tolerance: Patient tolerated treatment well (though fatigued) Patient left: in chair;with call bell/phone within reach     Time: 0824-0910 (mainus approx 5 min while PA in room) PT Time Calculation (min): 46 min  Charges:  $Gait Training: 23-37 mins $Therapeutic Activity: 8-22 mins                    G Codes:      Roney Marion Hamff 09/23/2013, 10:09 AM  Roney Marion, [place] Pager [phone] Office [phone]

## 2013-09-23 NOTE — Progress Notes (Signed)
Written and verbal instructions provided to patient. Patient states understanding and denies questions. Prescriptions for asa, analgesic and musc relaxor given to patient. IV out, eating lunch and awaiting transport.

## 2013-09-23 NOTE — Progress Notes (Signed)
Clinical Social Worker (CSW) received consult for SNF placement per chart patient is going home with home health. Please reconsult if further social work needs arise. CSW signing off.   Blima Rich, [place] Weekend CSW [phone]

## 2013-09-23 NOTE — Care Management Note (Signed)
    Page 1 of 2   09/23/2013     9:23:40 AM   CARE MANAGEMENT NOTE 09/23/2013  Patient:  AVNEET, ASHMORE   Account Number:  [id number]  Date Initiated:  09/20/2013  Documentation initiated by:  Ricki Miller  Subjective/Objective Assessment:   68 yr old female admitted for painful left ASR recall total hip arthroplasty.     Action/Plan:   Case manager spoke with patient concerning home health and DME needs at discharge. Preoperatively setup with [facility], no changes. Patient has rolling walker and 3in1, this is her 3rd surgery. Husband to assist at discharge.   Anticipated DC Date:  09/21/2013   Anticipated DC Plan:  [place]  CM consult      PAC Choice  [place]   Choice offered to / List presented to:  C-1 Patient        [place] arranged  [place] RN      [place].   Status of service:  Completed, signed off Medicare Important Message given?   (If response is "NO", the following Medicare IM given date fields will be blank) Date Medicare IM given:   Date Additional Medicare IM given:    Discharge Disposition:  [place]  Per UR Regulation:    If discussed at Long Length of Stay Meetings, dates discussed:    Comments:  09/23/13 09:21 CM called [facility] to notify of pt discharge.  Pt receiving HHPT/OT/RN which was set up preoperatively.  No other CM needs were communicated.  Mariane Masters, BSN, CM [phone].

## 2013-09-24 DIAGNOSIS — Z471 Aftercare following joint replacement surgery: Secondary | ICD-10-CM | POA: Diagnosis not present

## 2013-09-24 DIAGNOSIS — Z96649 Presence of unspecified artificial hip joint: Secondary | ICD-10-CM | POA: Diagnosis not present

## 2013-09-24 DIAGNOSIS — I10 Essential (primary) hypertension: Secondary | ICD-10-CM | POA: Diagnosis not present

## 2013-09-24 DIAGNOSIS — F411 Generalized anxiety disorder: Secondary | ICD-10-CM | POA: Diagnosis not present

## 2013-09-24 DIAGNOSIS — R269 Unspecified abnormalities of gait and mobility: Secondary | ICD-10-CM | POA: Diagnosis not present

## 2013-09-24 DIAGNOSIS — K219 Gastro-esophageal reflux disease without esophagitis: Secondary | ICD-10-CM | POA: Diagnosis not present

## 2013-09-24 DIAGNOSIS — M129 Arthropathy, unspecified: Secondary | ICD-10-CM | POA: Diagnosis not present

## 2013-09-24 LAB — ANAEROBIC CULTURE: Gram Stain: NONE SEEN

## 2013-09-25 DIAGNOSIS — M129 Arthropathy, unspecified: Secondary | ICD-10-CM | POA: Diagnosis not present

## 2013-09-25 DIAGNOSIS — Z471 Aftercare following joint replacement surgery: Secondary | ICD-10-CM | POA: Diagnosis not present

## 2013-09-25 DIAGNOSIS — K219 Gastro-esophageal reflux disease without esophagitis: Secondary | ICD-10-CM | POA: Diagnosis not present

## 2013-09-25 DIAGNOSIS — R269 Unspecified abnormalities of gait and mobility: Secondary | ICD-10-CM | POA: Diagnosis not present

## 2013-09-25 DIAGNOSIS — I10 Essential (primary) hypertension: Secondary | ICD-10-CM | POA: Diagnosis not present

## 2013-09-25 DIAGNOSIS — Z96649 Presence of unspecified artificial hip joint: Secondary | ICD-10-CM | POA: Diagnosis not present

## 2013-09-26 DIAGNOSIS — Z471 Aftercare following joint replacement surgery: Secondary | ICD-10-CM | POA: Diagnosis not present

## 2013-09-26 DIAGNOSIS — I10 Essential (primary) hypertension: Secondary | ICD-10-CM | POA: Diagnosis not present

## 2013-09-26 DIAGNOSIS — K219 Gastro-esophageal reflux disease without esophagitis: Secondary | ICD-10-CM | POA: Diagnosis not present

## 2013-09-26 DIAGNOSIS — M129 Arthropathy, unspecified: Secondary | ICD-10-CM | POA: Diagnosis not present

## 2013-09-26 DIAGNOSIS — Z96649 Presence of unspecified artificial hip joint: Secondary | ICD-10-CM | POA: Diagnosis not present

## 2013-09-26 DIAGNOSIS — R269 Unspecified abnormalities of gait and mobility: Secondary | ICD-10-CM | POA: Diagnosis not present

## 2013-09-27 DIAGNOSIS — K219 Gastro-esophageal reflux disease without esophagitis: Secondary | ICD-10-CM | POA: Diagnosis not present

## 2013-09-27 DIAGNOSIS — I10 Essential (primary) hypertension: Secondary | ICD-10-CM | POA: Diagnosis not present

## 2013-09-27 DIAGNOSIS — Z96649 Presence of unspecified artificial hip joint: Secondary | ICD-10-CM | POA: Diagnosis not present

## 2013-09-27 DIAGNOSIS — R269 Unspecified abnormalities of gait and mobility: Secondary | ICD-10-CM | POA: Diagnosis not present

## 2013-09-27 DIAGNOSIS — M129 Arthropathy, unspecified: Secondary | ICD-10-CM | POA: Diagnosis not present

## 2013-09-27 DIAGNOSIS — Z471 Aftercare following joint replacement surgery: Secondary | ICD-10-CM | POA: Diagnosis not present

## 2013-09-28 DIAGNOSIS — Z471 Aftercare following joint replacement surgery: Secondary | ICD-10-CM | POA: Diagnosis not present

## 2013-09-28 DIAGNOSIS — K219 Gastro-esophageal reflux disease without esophagitis: Secondary | ICD-10-CM | POA: Diagnosis not present

## 2013-09-28 DIAGNOSIS — I10 Essential (primary) hypertension: Secondary | ICD-10-CM | POA: Diagnosis not present

## 2013-09-28 DIAGNOSIS — R269 Unspecified abnormalities of gait and mobility: Secondary | ICD-10-CM | POA: Diagnosis not present

## 2013-09-28 DIAGNOSIS — M129 Arthropathy, unspecified: Secondary | ICD-10-CM | POA: Diagnosis not present

## 2013-09-28 DIAGNOSIS — Z96649 Presence of unspecified artificial hip joint: Secondary | ICD-10-CM | POA: Diagnosis not present

## 2013-10-01 DIAGNOSIS — R269 Unspecified abnormalities of gait and mobility: Secondary | ICD-10-CM | POA: Diagnosis not present

## 2013-10-01 DIAGNOSIS — Z96649 Presence of unspecified artificial hip joint: Secondary | ICD-10-CM | POA: Diagnosis not present

## 2013-10-01 DIAGNOSIS — Z471 Aftercare following joint replacement surgery: Secondary | ICD-10-CM | POA: Diagnosis not present

## 2013-10-01 DIAGNOSIS — M129 Arthropathy, unspecified: Secondary | ICD-10-CM | POA: Diagnosis not present

## 2013-10-01 DIAGNOSIS — I10 Essential (primary) hypertension: Secondary | ICD-10-CM | POA: Diagnosis not present

## 2013-10-01 DIAGNOSIS — K219 Gastro-esophageal reflux disease without esophagitis: Secondary | ICD-10-CM | POA: Diagnosis not present

## 2013-10-02 DIAGNOSIS — M129 Arthropathy, unspecified: Secondary | ICD-10-CM | POA: Diagnosis not present

## 2013-10-02 DIAGNOSIS — K219 Gastro-esophageal reflux disease without esophagitis: Secondary | ICD-10-CM | POA: Diagnosis not present

## 2013-10-02 DIAGNOSIS — I10 Essential (primary) hypertension: Secondary | ICD-10-CM | POA: Diagnosis not present

## 2013-10-02 DIAGNOSIS — R269 Unspecified abnormalities of gait and mobility: Secondary | ICD-10-CM | POA: Diagnosis not present

## 2013-10-02 DIAGNOSIS — Z96649 Presence of unspecified artificial hip joint: Secondary | ICD-10-CM | POA: Diagnosis not present

## 2013-10-02 DIAGNOSIS — Z471 Aftercare following joint replacement surgery: Secondary | ICD-10-CM | POA: Diagnosis not present

## 2013-10-02 DIAGNOSIS — T84099A Other mechanical complication of unspecified internal joint prosthesis, initial encounter: Secondary | ICD-10-CM | POA: Diagnosis not present

## 2013-10-03 DIAGNOSIS — R269 Unspecified abnormalities of gait and mobility: Secondary | ICD-10-CM | POA: Diagnosis not present

## 2013-10-03 DIAGNOSIS — Z471 Aftercare following joint replacement surgery: Secondary | ICD-10-CM | POA: Diagnosis not present

## 2013-10-03 DIAGNOSIS — K219 Gastro-esophageal reflux disease without esophagitis: Secondary | ICD-10-CM | POA: Diagnosis not present

## 2013-10-03 DIAGNOSIS — Z96649 Presence of unspecified artificial hip joint: Secondary | ICD-10-CM | POA: Diagnosis not present

## 2013-10-03 DIAGNOSIS — M129 Arthropathy, unspecified: Secondary | ICD-10-CM | POA: Diagnosis not present

## 2013-10-03 DIAGNOSIS — I10 Essential (primary) hypertension: Secondary | ICD-10-CM | POA: Diagnosis not present

## 2013-10-04 DIAGNOSIS — K219 Gastro-esophageal reflux disease without esophagitis: Secondary | ICD-10-CM | POA: Diagnosis not present

## 2013-10-04 DIAGNOSIS — I10 Essential (primary) hypertension: Secondary | ICD-10-CM | POA: Diagnosis not present

## 2013-10-04 DIAGNOSIS — Z471 Aftercare following joint replacement surgery: Secondary | ICD-10-CM | POA: Diagnosis not present

## 2013-10-04 DIAGNOSIS — R269 Unspecified abnormalities of gait and mobility: Secondary | ICD-10-CM | POA: Diagnosis not present

## 2013-10-04 DIAGNOSIS — M129 Arthropathy, unspecified: Secondary | ICD-10-CM | POA: Diagnosis not present

## 2013-10-04 DIAGNOSIS — Z96649 Presence of unspecified artificial hip joint: Secondary | ICD-10-CM | POA: Diagnosis not present

## 2013-10-08 DIAGNOSIS — M129 Arthropathy, unspecified: Secondary | ICD-10-CM | POA: Diagnosis not present

## 2013-10-08 DIAGNOSIS — R269 Unspecified abnormalities of gait and mobility: Secondary | ICD-10-CM | POA: Diagnosis not present

## 2013-10-08 DIAGNOSIS — Z96649 Presence of unspecified artificial hip joint: Secondary | ICD-10-CM | POA: Diagnosis not present

## 2013-10-08 DIAGNOSIS — K219 Gastro-esophageal reflux disease without esophagitis: Secondary | ICD-10-CM | POA: Diagnosis not present

## 2013-10-08 DIAGNOSIS — I10 Essential (primary) hypertension: Secondary | ICD-10-CM | POA: Diagnosis not present

## 2013-10-08 DIAGNOSIS — Z471 Aftercare following joint replacement surgery: Secondary | ICD-10-CM | POA: Diagnosis not present

## 2013-10-09 DIAGNOSIS — Z96649 Presence of unspecified artificial hip joint: Secondary | ICD-10-CM | POA: Diagnosis not present

## 2013-10-09 DIAGNOSIS — I10 Essential (primary) hypertension: Secondary | ICD-10-CM | POA: Diagnosis not present

## 2013-10-09 DIAGNOSIS — K219 Gastro-esophageal reflux disease without esophagitis: Secondary | ICD-10-CM | POA: Diagnosis not present

## 2013-10-09 DIAGNOSIS — R269 Unspecified abnormalities of gait and mobility: Secondary | ICD-10-CM | POA: Diagnosis not present

## 2013-10-09 DIAGNOSIS — M129 Arthropathy, unspecified: Secondary | ICD-10-CM | POA: Diagnosis not present

## 2013-10-09 DIAGNOSIS — Z471 Aftercare following joint replacement surgery: Secondary | ICD-10-CM | POA: Diagnosis not present

## 2013-10-10 DIAGNOSIS — Z96649 Presence of unspecified artificial hip joint: Secondary | ICD-10-CM | POA: Diagnosis not present

## 2013-10-10 DIAGNOSIS — R269 Unspecified abnormalities of gait and mobility: Secondary | ICD-10-CM | POA: Diagnosis not present

## 2013-10-10 DIAGNOSIS — K219 Gastro-esophageal reflux disease without esophagitis: Secondary | ICD-10-CM | POA: Diagnosis not present

## 2013-10-10 DIAGNOSIS — I10 Essential (primary) hypertension: Secondary | ICD-10-CM | POA: Diagnosis not present

## 2013-10-10 DIAGNOSIS — M129 Arthropathy, unspecified: Secondary | ICD-10-CM | POA: Diagnosis not present

## 2013-10-10 DIAGNOSIS — Z471 Aftercare following joint replacement surgery: Secondary | ICD-10-CM | POA: Diagnosis not present

## 2013-10-17 DIAGNOSIS — M25559 Pain in unspecified hip: Secondary | ICD-10-CM | POA: Diagnosis not present

## 2013-10-17 DIAGNOSIS — Z96649 Presence of unspecified artificial hip joint: Secondary | ICD-10-CM | POA: Diagnosis not present

## 2013-10-23 DIAGNOSIS — Z96649 Presence of unspecified artificial hip joint: Secondary | ICD-10-CM | POA: Diagnosis not present

## 2013-10-23 DIAGNOSIS — M25559 Pain in unspecified hip: Secondary | ICD-10-CM | POA: Diagnosis not present

## 2013-10-25 DIAGNOSIS — Z96649 Presence of unspecified artificial hip joint: Secondary | ICD-10-CM | POA: Diagnosis not present

## 2013-10-25 DIAGNOSIS — M25559 Pain in unspecified hip: Secondary | ICD-10-CM | POA: Diagnosis not present

## 2013-10-29 DIAGNOSIS — Z96649 Presence of unspecified artificial hip joint: Secondary | ICD-10-CM | POA: Diagnosis not present

## 2013-10-29 DIAGNOSIS — M25559 Pain in unspecified hip: Secondary | ICD-10-CM | POA: Diagnosis not present

## 2013-10-31 DIAGNOSIS — Z96649 Presence of unspecified artificial hip joint: Secondary | ICD-10-CM | POA: Diagnosis not present

## 2013-10-31 DIAGNOSIS — M25559 Pain in unspecified hip: Secondary | ICD-10-CM | POA: Diagnosis not present

## 2013-11-02 DIAGNOSIS — M6281 Muscle weakness (generalized): Secondary | ICD-10-CM | POA: Diagnosis not present

## 2013-11-02 DIAGNOSIS — I10 Essential (primary) hypertension: Secondary | ICD-10-CM | POA: Diagnosis not present

## 2013-11-02 DIAGNOSIS — Z23 Encounter for immunization: Secondary | ICD-10-CM | POA: Diagnosis not present

## 2013-11-05 DIAGNOSIS — Z96649 Presence of unspecified artificial hip joint: Secondary | ICD-10-CM | POA: Diagnosis not present

## 2013-11-05 DIAGNOSIS — M25559 Pain in unspecified hip: Secondary | ICD-10-CM | POA: Diagnosis not present

## 2013-11-07 DIAGNOSIS — M25559 Pain in unspecified hip: Secondary | ICD-10-CM | POA: Diagnosis not present

## 2013-11-07 DIAGNOSIS — Z96649 Presence of unspecified artificial hip joint: Secondary | ICD-10-CM | POA: Diagnosis not present

## 2013-11-13 DIAGNOSIS — M25559 Pain in unspecified hip: Secondary | ICD-10-CM | POA: Diagnosis not present

## 2013-11-13 DIAGNOSIS — Z96649 Presence of unspecified artificial hip joint: Secondary | ICD-10-CM | POA: Diagnosis not present

## 2013-11-15 DIAGNOSIS — M25559 Pain in unspecified hip: Secondary | ICD-10-CM | POA: Diagnosis not present

## 2013-11-15 DIAGNOSIS — Z96649 Presence of unspecified artificial hip joint: Secondary | ICD-10-CM | POA: Diagnosis not present

## 2013-11-19 DIAGNOSIS — M25559 Pain in unspecified hip: Secondary | ICD-10-CM | POA: Diagnosis not present

## 2013-11-19 DIAGNOSIS — Z96649 Presence of unspecified artificial hip joint: Secondary | ICD-10-CM | POA: Diagnosis not present

## 2013-11-21 DIAGNOSIS — M25559 Pain in unspecified hip: Secondary | ICD-10-CM | POA: Diagnosis not present

## 2013-11-21 DIAGNOSIS — Z96649 Presence of unspecified artificial hip joint: Secondary | ICD-10-CM | POA: Diagnosis not present

## 2013-11-26 DIAGNOSIS — M25559 Pain in unspecified hip: Secondary | ICD-10-CM | POA: Diagnosis not present

## 2013-11-26 DIAGNOSIS — Z96649 Presence of unspecified artificial hip joint: Secondary | ICD-10-CM | POA: Diagnosis not present

## 2013-11-28 DIAGNOSIS — Z96649 Presence of unspecified artificial hip joint: Secondary | ICD-10-CM | POA: Diagnosis not present

## 2013-11-28 DIAGNOSIS — M25559 Pain in unspecified hip: Secondary | ICD-10-CM | POA: Diagnosis not present

## 2014-02-28 DIAGNOSIS — M25559 Pain in unspecified hip: Secondary | ICD-10-CM | POA: Diagnosis not present

## 2014-03-07 DIAGNOSIS — D485 Neoplasm of uncertain behavior of skin: Secondary | ICD-10-CM | POA: Diagnosis not present

## 2014-03-07 DIAGNOSIS — L819 Disorder of pigmentation, unspecified: Secondary | ICD-10-CM | POA: Diagnosis not present

## 2014-03-07 DIAGNOSIS — D235 Other benign neoplasm of skin of trunk: Secondary | ICD-10-CM | POA: Diagnosis not present

## 2014-03-07 DIAGNOSIS — L821 Other seborrheic keratosis: Secondary | ICD-10-CM | POA: Diagnosis not present

## 2014-03-11 DIAGNOSIS — E7800 Pure hypercholesterolemia, unspecified: Secondary | ICD-10-CM | POA: Diagnosis not present

## 2014-03-11 DIAGNOSIS — I10 Essential (primary) hypertension: Secondary | ICD-10-CM | POA: Diagnosis not present

## 2014-03-11 DIAGNOSIS — K219 Gastro-esophageal reflux disease without esophagitis: Secondary | ICD-10-CM | POA: Diagnosis not present

## 2014-03-11 DIAGNOSIS — F411 Generalized anxiety disorder: Secondary | ICD-10-CM | POA: Diagnosis not present

## 2014-03-11 DIAGNOSIS — Z23 Encounter for immunization: Secondary | ICD-10-CM | POA: Diagnosis not present

## 2014-03-12 DIAGNOSIS — H25049 Posterior subcapsular polar age-related cataract, unspecified eye: Secondary | ICD-10-CM | POA: Diagnosis not present

## 2014-03-15 DIAGNOSIS — K219 Gastro-esophageal reflux disease without esophagitis: Secondary | ICD-10-CM | POA: Diagnosis not present

## 2014-03-15 DIAGNOSIS — K1321 Leukoplakia of oral mucosa, including tongue: Secondary | ICD-10-CM | POA: Diagnosis not present

## 2014-03-21 DIAGNOSIS — K1321 Leukoplakia of oral mucosa, including tongue: Secondary | ICD-10-CM | POA: Diagnosis not present

## 2014-03-21 DIAGNOSIS — K1379 Other lesions of oral mucosa: Secondary | ICD-10-CM | POA: Diagnosis not present

## 2014-04-02 DIAGNOSIS — H40051 Ocular hypertension, right eye: Secondary | ICD-10-CM | POA: Diagnosis not present

## 2014-04-02 DIAGNOSIS — H2513 Age-related nuclear cataract, bilateral: Secondary | ICD-10-CM | POA: Diagnosis not present

## 2014-04-02 DIAGNOSIS — H25013 Cortical age-related cataract, bilateral: Secondary | ICD-10-CM | POA: Diagnosis not present

## 2014-04-02 DIAGNOSIS — H25041 Posterior subcapsular polar age-related cataract, right eye: Secondary | ICD-10-CM | POA: Diagnosis not present

## 2014-04-17 DIAGNOSIS — H25011 Cortical age-related cataract, right eye: Secondary | ICD-10-CM | POA: Diagnosis not present

## 2014-04-17 DIAGNOSIS — H52201 Unspecified astigmatism, right eye: Secondary | ICD-10-CM | POA: Diagnosis not present

## 2014-04-17 DIAGNOSIS — H25041 Posterior subcapsular polar age-related cataract, right eye: Secondary | ICD-10-CM | POA: Diagnosis not present

## 2014-04-17 DIAGNOSIS — H2511 Age-related nuclear cataract, right eye: Secondary | ICD-10-CM | POA: Diagnosis not present

## 2014-04-17 DIAGNOSIS — H25811 Combined forms of age-related cataract, right eye: Secondary | ICD-10-CM | POA: Diagnosis not present

## 2014-04-17 DIAGNOSIS — H268 Other specified cataract: Secondary | ICD-10-CM | POA: Diagnosis not present

## 2014-05-01 DIAGNOSIS — H25012 Cortical age-related cataract, left eye: Secondary | ICD-10-CM | POA: Diagnosis not present

## 2014-05-01 DIAGNOSIS — H2512 Age-related nuclear cataract, left eye: Secondary | ICD-10-CM | POA: Diagnosis not present

## 2014-05-01 DIAGNOSIS — H25812 Combined forms of age-related cataract, left eye: Secondary | ICD-10-CM | POA: Diagnosis not present

## 2014-05-01 DIAGNOSIS — H268 Other specified cataract: Secondary | ICD-10-CM | POA: Diagnosis not present

## 2014-05-01 DIAGNOSIS — H52202 Unspecified astigmatism, left eye: Secondary | ICD-10-CM | POA: Diagnosis not present

## 2014-06-24 DIAGNOSIS — Z803 Family history of malignant neoplasm of breast: Secondary | ICD-10-CM | POA: Diagnosis not present

## 2014-06-24 DIAGNOSIS — Z1231 Encounter for screening mammogram for malignant neoplasm of breast: Secondary | ICD-10-CM | POA: Diagnosis not present

## 2014-09-10 DIAGNOSIS — I10 Essential (primary) hypertension: Secondary | ICD-10-CM | POA: Diagnosis not present

## 2014-09-10 DIAGNOSIS — R197 Diarrhea, unspecified: Secondary | ICD-10-CM | POA: Diagnosis not present

## 2014-09-10 DIAGNOSIS — Z1211 Encounter for screening for malignant neoplasm of colon: Secondary | ICD-10-CM | POA: Diagnosis not present

## 2014-09-10 DIAGNOSIS — E780 Pure hypercholesterolemia: Secondary | ICD-10-CM | POA: Diagnosis not present

## 2014-09-16 DIAGNOSIS — R300 Dysuria: Secondary | ICD-10-CM | POA: Diagnosis not present

## 2014-09-18 DIAGNOSIS — H5310 Unspecified subjective visual disturbances: Secondary | ICD-10-CM | POA: Diagnosis not present

## 2014-09-18 DIAGNOSIS — H43811 Vitreous degeneration, right eye: Secondary | ICD-10-CM | POA: Diagnosis not present

## 2014-09-18 DIAGNOSIS — Z961 Presence of intraocular lens: Secondary | ICD-10-CM | POA: Diagnosis not present

## 2014-10-08 DIAGNOSIS — Z09 Encounter for follow-up examination after completed treatment for conditions other than malignant neoplasm: Secondary | ICD-10-CM | POA: Diagnosis not present

## 2014-10-08 DIAGNOSIS — Z96642 Presence of left artificial hip joint: Secondary | ICD-10-CM | POA: Diagnosis not present

## 2014-10-17 DIAGNOSIS — H40052 Ocular hypertension, left eye: Secondary | ICD-10-CM | POA: Diagnosis not present

## 2014-10-17 DIAGNOSIS — H43811 Vitreous degeneration, right eye: Secondary | ICD-10-CM | POA: Diagnosis not present

## 2015-03-25 DIAGNOSIS — F419 Anxiety disorder, unspecified: Secondary | ICD-10-CM | POA: Diagnosis not present

## 2015-03-25 DIAGNOSIS — Z1389 Encounter for screening for other disorder: Secondary | ICD-10-CM | POA: Diagnosis not present

## 2015-03-25 DIAGNOSIS — Z23 Encounter for immunization: Secondary | ICD-10-CM | POA: Diagnosis not present

## 2015-03-25 DIAGNOSIS — Z0000 Encounter for general adult medical examination without abnormal findings: Secondary | ICD-10-CM | POA: Diagnosis not present

## 2015-03-25 DIAGNOSIS — I10 Essential (primary) hypertension: Secondary | ICD-10-CM | POA: Diagnosis not present

## 2015-03-25 DIAGNOSIS — K219 Gastro-esophageal reflux disease without esophagitis: Secondary | ICD-10-CM | POA: Diagnosis not present

## 2015-04-14 DIAGNOSIS — Z6830 Body mass index (BMI) 30.0-30.9, adult: Secondary | ICD-10-CM | POA: Diagnosis not present

## 2015-04-14 DIAGNOSIS — Z124 Encounter for screening for malignant neoplasm of cervix: Secondary | ICD-10-CM | POA: Diagnosis not present

## 2015-04-14 DIAGNOSIS — Z1272 Encounter for screening for malignant neoplasm of vagina: Secondary | ICD-10-CM | POA: Diagnosis not present

## 2015-04-14 DIAGNOSIS — R351 Nocturia: Secondary | ICD-10-CM | POA: Diagnosis not present

## 2015-04-23 DIAGNOSIS — D1801 Hemangioma of skin and subcutaneous tissue: Secondary | ICD-10-CM | POA: Diagnosis not present

## 2015-04-23 DIAGNOSIS — L820 Inflamed seborrheic keratosis: Secondary | ICD-10-CM | POA: Diagnosis not present

## 2015-04-23 DIAGNOSIS — L821 Other seborrheic keratosis: Secondary | ICD-10-CM | POA: Diagnosis not present

## 2015-04-23 DIAGNOSIS — Z85828 Personal history of other malignant neoplasm of skin: Secondary | ICD-10-CM | POA: Diagnosis not present

## 2015-04-23 DIAGNOSIS — L814 Other melanin hyperpigmentation: Secondary | ICD-10-CM | POA: Diagnosis not present

## 2015-04-23 DIAGNOSIS — L570 Actinic keratosis: Secondary | ICD-10-CM | POA: Diagnosis not present

## 2015-06-08 ENCOUNTER — Emergency Department (HOSPITAL_COMMUNITY): Payer: Medicare Other

## 2015-06-08 ENCOUNTER — Encounter (HOSPITAL_COMMUNITY): Payer: Self-pay | Admitting: Emergency Medicine

## 2015-06-08 ENCOUNTER — Emergency Department (HOSPITAL_COMMUNITY)
Admission: EM | Admit: 2015-06-08 | Discharge: 2015-06-08 | Disposition: A | Discharge status: Home / Self Care | Payer: Medicare Other | Attending: Emergency Medicine | Admitting: Emergency Medicine

## 2015-06-08 DIAGNOSIS — K219 Gastro-esophageal reflux disease without esophagitis: Secondary | ICD-10-CM | POA: Insufficient documentation

## 2015-06-08 DIAGNOSIS — Z7982 Long term (current) use of aspirin: Secondary | ICD-10-CM | POA: Diagnosis not present

## 2015-06-08 DIAGNOSIS — F419 Anxiety disorder, unspecified: Secondary | ICD-10-CM | POA: Insufficient documentation

## 2015-06-08 DIAGNOSIS — M1990 Unspecified osteoarthritis, unspecified site: Secondary | ICD-10-CM | POA: Insufficient documentation

## 2015-06-08 DIAGNOSIS — Z791 Long term (current) use of non-steroidal anti-inflammatories (NSAID): Secondary | ICD-10-CM | POA: Insufficient documentation

## 2015-06-08 DIAGNOSIS — I10 Essential (primary) hypertension: Secondary | ICD-10-CM | POA: Diagnosis not present

## 2015-06-08 DIAGNOSIS — Z79899 Other long term (current) drug therapy: Secondary | ICD-10-CM | POA: Insufficient documentation

## 2015-06-08 DIAGNOSIS — I4891 Unspecified atrial fibrillation: Secondary | ICD-10-CM | POA: Insufficient documentation

## 2015-06-08 DIAGNOSIS — N390 Urinary tract infection, site not specified: Secondary | ICD-10-CM | POA: Insufficient documentation

## 2015-06-08 DIAGNOSIS — Z85828 Personal history of other malignant neoplasm of skin: Secondary | ICD-10-CM | POA: Insufficient documentation

## 2015-06-08 DIAGNOSIS — Z9049 Acquired absence of other specified parts of digestive tract: Secondary | ICD-10-CM | POA: Diagnosis not present

## 2015-06-08 DIAGNOSIS — Z8701 Personal history of pneumonia (recurrent): Secondary | ICD-10-CM | POA: Insufficient documentation

## 2015-06-08 DIAGNOSIS — R197 Diarrhea, unspecified: Secondary | ICD-10-CM | POA: Insufficient documentation

## 2015-06-08 DIAGNOSIS — G43909 Migraine, unspecified, not intractable, without status migrainosus: Secondary | ICD-10-CM | POA: Diagnosis not present

## 2015-06-08 DIAGNOSIS — G8929 Other chronic pain: Secondary | ICD-10-CM | POA: Insufficient documentation

## 2015-06-08 DIAGNOSIS — E860 Dehydration: Secondary | ICD-10-CM | POA: Diagnosis not present

## 2015-06-08 DIAGNOSIS — Z90710 Acquired absence of both cervix and uterus: Secondary | ICD-10-CM | POA: Insufficient documentation

## 2015-06-08 DIAGNOSIS — R112 Nausea with vomiting, unspecified: Secondary | ICD-10-CM | POA: Diagnosis present

## 2015-06-08 DIAGNOSIS — R1110 Vomiting, unspecified: Secondary | ICD-10-CM | POA: Diagnosis not present

## 2015-06-08 LAB — URINALYSIS, ROUTINE W REFLEX MICROSCOPIC
Bilirubin Urine: NEGATIVE
Glucose, UA: NEGATIVE mg/dL
Ketones, ur: 40 mg/dL — AB
Nitrite: POSITIVE — AB
PROTEIN: 100 mg/dL — AB
Specific Gravity, Urine: 1.022 (ref 1.005–1.030)
pH: 6 (ref 5.0–8.0)

## 2015-06-08 LAB — CBC WITH DIFFERENTIAL/PLATELET
BASOS ABS: 0 10*3/uL (ref 0.0–0.1)
Basophils Relative: 0 %
EOS ABS: 0.1 10*3/uL (ref 0.0–0.7)
EOS PCT: 1 %
HCT: 36.1 % (ref 36.0–46.0)
Hemoglobin: 12.3 g/dL (ref 12.0–15.0)
LYMPHS PCT: 9 %
Lymphs Abs: 0.4 10*3/uL — ABNORMAL LOW (ref 0.7–4.0)
MCH: 31.3 pg (ref 26.0–34.0)
MCHC: 34.1 g/dL (ref 30.0–36.0)
MCV: 91.9 fL (ref 78.0–100.0)
MONO ABS: 0.5 10*3/uL (ref 0.1–1.0)
Monocytes Relative: 10 %
Neutro Abs: 4.1 10*3/uL (ref 1.7–7.7)
Neutrophils Relative %: 80 %
PLATELETS: 200 10*3/uL (ref 150–400)
RBC: 3.93 MIL/uL (ref 3.87–5.11)
RDW: 13.2 % (ref 11.5–15.5)
WBC: 5.2 10*3/uL (ref 4.0–10.5)

## 2015-06-08 LAB — COMPREHENSIVE METABOLIC PANEL
ALT: 22 U/L (ref 14–54)
AST: 23 U/L (ref 15–41)
Albumin: 3.5 g/dL (ref 3.5–5.0)
Alkaline Phosphatase: 49 U/L (ref 38–126)
Anion gap: 10 (ref 5–15)
BUN: 25 mg/dL — AB (ref 6–20)
CO2: 25 mmol/L (ref 22–32)
Calcium: 7.9 mg/dL — ABNORMAL LOW (ref 8.9–10.3)
Chloride: 104 mmol/L (ref 101–111)
Creatinine, Ser: 0.9 mg/dL (ref 0.44–1.00)
GFR calc Af Amer: >60 mL/min (ref >60)
Glucose, Bld: 119 mg/dL — ABNORMAL HIGH (ref 65–99)
POTASSIUM: 3.1 mmol/L — AB (ref 3.5–5.1)
SODIUM: 139 mmol/L (ref 135–145)
Total Bilirubin: 1.1 mg/dL (ref 0.3–1.2)
Total Protein: 6.1 g/dL — ABNORMAL LOW (ref 6.5–8.1)

## 2015-06-08 LAB — URINE MICROSCOPIC-ADD ON

## 2015-06-08 LAB — LIPASE, BLOOD: LIPASE: 20 U/L (ref 11–51)

## 2015-06-08 MED ORDER — SODIUM CHLORIDE 0.9 % IV BOLUS (SEPSIS)
1000.0000 mL | Freq: Once | INTRAVENOUS | Status: AC
  Administered 2015-06-08: 1000 mL via INTRAVENOUS

## 2015-06-08 MED ORDER — IOHEXOL 300 MG/ML  SOLN
50.0000 mL | Freq: Once | INTRAMUSCULAR | Status: AC | PRN
  Administered 2015-06-08: 50 mL via ORAL

## 2015-06-08 MED ORDER — SODIUM CHLORIDE 0.9 % IV BOLUS (SEPSIS): 1000.0000 mL | Freq: Once | INTRAVENOUS | Status: DC

## 2015-06-08 MED ORDER — LOPERAMIDE HCL 2 MG PO CAPS: 2.0000 mg | ORAL_CAPSULE | Freq: Four times a day (QID) | ORAL | Status: DC | PRN

## 2015-06-08 MED ORDER — ONDANSETRON HCL 4 MG/2ML IJ SOLN
4.0000 mg | Freq: Once | INTRAMUSCULAR | Status: AC
  Administered 2015-06-08: 4 mg via INTRAVENOUS
  Filled 2015-06-08: qty 2

## 2015-06-08 MED ORDER — LOPERAMIDE HCL 2 MG PO CAPS
2.0000 mg | ORAL_CAPSULE | Freq: Once | ORAL | Status: AC
  Administered 2015-06-08: 2 mg via ORAL
  Filled 2015-06-08: qty 1

## 2015-06-08 MED ORDER — CEPHALEXIN 500 MG PO CAPS: 500.0000 mg | ORAL_CAPSULE | Freq: Four times a day (QID) | ORAL | Status: DC

## 2015-06-08 MED ORDER — POTASSIUM CHLORIDE CRYS ER 20 MEQ PO TBCR
40.0000 meq | EXTENDED_RELEASE_TABLET | Freq: Once | ORAL | Status: AC
  Administered 2015-06-08: 40 meq via ORAL
  Filled 2015-06-08: qty 2

## 2015-06-08 MED ORDER — ONDANSETRON 4 MG PO TBDP: 4.0000 mg | ORAL_TABLET | Freq: Three times a day (TID) | ORAL | Status: DC | PRN

## 2015-06-08 MED ORDER — DEXTROSE 5 % IV SOLN
1.0000 g | INTRAVENOUS | Status: DC
  Administered 2015-06-08: 1 g via INTRAVENOUS
  Filled 2015-06-08: qty 10

## 2015-06-08 NOTE — Discharge Instructions (Signed)
Diarrhea °Diarrhea is frequent loose and watery bowel movements. It can cause you to feel weak and dehydrated. Dehydration can cause you to become tired and thirsty, have a dry mouth, and have decreased urination that often is dark yellow. Diarrhea is a sign of another problem, most often an infection that will not last long. In most cases, diarrhea typically lasts 2-3 days. However, it can last longer if it is a sign of something more serious. It is important to treat your diarrhea as directed by your caregiver to lessen or prevent future episodes of diarrhea. °CAUSES  °Some common causes include: °· Gastrointestinal infections caused by viruses, bacteria, or parasites. °· Food poisoning or food allergies. °· Certain medicines, such as antibiotics, chemotherapy, and laxatives. °· Artificial sweeteners and fructose. °· Digestive disorders. °HOME CARE INSTRUCTIONS °· Ensure adequate fluid intake (hydration): Have 1 cup (8 oz) of fluid for each diarrhea episode. Avoid fluids that contain simple sugars or sports drinks, fruit juices, whole milk products, and sodas. Your urine should be clear or pale yellow if you are drinking enough fluids. Hydrate with an oral rehydration solution that you can purchase at pharmacies, retail stores, and online. You can prepare an oral rehydration solution at home by mixing the following ingredients together: °·  - tsp table salt. °· ¾ tsp baking soda. °·  tsp salt substitute containing potassium chloride. °· 1  tablespoons sugar. °· 1 L (34 oz) of water. °· Certain foods and beverages may increase the speed at which food moves through the gastrointestinal (GI) tract. These foods and beverages should be avoided and include: °· Caffeinated and alcoholic beverages. °· High-fiber foods, such as raw fruits and vegetables, nuts, seeds, and whole grain breads and cereals. °· Foods and beverages sweetened with sugar alcohols, such as xylitol, sorbitol, and mannitol. °· Some foods may be well  tolerated and may help thicken stool including: °· Starchy foods, such as rice, toast, pasta, low-sugar cereal, oatmeal, grits, baked potatoes, crackers, and bagels. °· Bananas. °· Applesauce. °· Add probiotic-rich foods to help increase healthy bacteria in the GI tract, such as yogurt and fermented milk products. °· Wash your hands well after each diarrhea episode. °· Only take over-the-counter or prescription medicines as directed by your caregiver. °· Take a warm bath to relieve any burning or pain from frequent diarrhea episodes. °SEEK IMMEDIATE MEDICAL CARE IF:  °· You are unable to keep fluids down. °· You have persistent vomiting. °· You have blood in your stool, or your stools are black and tarry. °· You do not urinate in 6-8 hours, or there is only a small amount of very dark urine. °· You have abdominal pain that increases or localizes. °· You have weakness, dizziness, confusion, or light-headedness. °· You have a severe headache. °· Your diarrhea gets worse or does not get better. °· You have a fever or persistent symptoms for more than 2-3 days. °· You have a fever and your symptoms suddenly get worse. °MAKE SURE YOU:  °· Understand these instructions. °· Will watch your condition. °· Will get help right away if you are not doing well or get worse. °  °This information is not intended to replace advice given to you by your health care provider. Make sure you discuss any questions you have with your health care provider. °  °Document Released: 05/28/2002 Document Revised: 06/28/2014 Document Reviewed: 02/13/2012 °Elsevier Interactive Patient Education ©2016 [employer] ° °Nausea and Vomiting °Nausea is a sick feeling that often comes   before throwing up (vomiting). Vomiting is a reflex where stomach contents come out of your mouth. Vomiting can cause severe loss of body fluids (dehydration). Children and elderly adults can become dehydrated quickly, especially if they also have diarrhea. Nausea and  vomiting are symptoms of a condition or disease. It is important to find the cause of your symptoms. CAUSES   Direct irritation of the stomach lining. This irritation can result from increased acid production (gastroesophageal reflux disease), infection, food poisoning, taking certain medicines (such as nonsteroidal anti-inflammatory drugs), alcohol use, or tobacco use.  Signals from the brain.These signals could be caused by a headache, heat exposure, an inner ear disturbance, increased pressure in the brain from injury, infection, a tumor, or a concussion, pain, emotional stimulus, or metabolic problems.  An obstruction in the gastrointestinal tract (bowel obstruction).  Illnesses such as diabetes, hepatitis, gallbladder problems, appendicitis, kidney problems, cancer, sepsis, atypical symptoms of a heart attack, or eating disorders.  Medical treatments such as chemotherapy and radiation.  Receiving medicine that makes you sleep (general anesthetic) during surgery. DIAGNOSIS Your caregiver may ask for tests to be done if the problems do not improve after a few days. Tests may also be done if symptoms are severe or if the reason for the nausea and vomiting is not clear. Tests may include:  Urine tests.  Blood tests.  Stool tests.  Cultures (to look for evidence of infection).  X-rays or other imaging studies. Test results can help your caregiver make decisions about treatment or the need for additional tests. TREATMENT You need to stay well hydrated. Drink frequently but in small amounts.You may wish to drink water, sports drinks, clear broth, or eat frozen ice pops or gelatin dessert to help stay hydrated.When you eat, eating slowly may help prevent nausea.There are also some antinausea medicines that may help prevent nausea. HOME CARE INSTRUCTIONS   Take all medicine as directed by your caregiver.  If you do not have an appetite, do not force yourself to eat. However, you must  continue to drink fluids.  If you have an appetite, eat a normal diet unless your caregiver tells you differently.  Eat a variety of complex carbohydrates (rice, wheat, potatoes, bread), lean meats, yogurt, fruits, and vegetables.  Avoid high-fat foods because they are more difficult to digest.  Drink enough water and fluids to keep your urine clear or pale yellow.  If you are dehydrated, ask your caregiver for specific rehydration instructions. Signs of dehydration may include:  Severe thirst.  Dry lips and mouth.  Dizziness.  Dark urine.  Decreasing urine frequency and amount.  Confusion.  Rapid breathing or pulse. SEEK IMMEDIATE MEDICAL CARE IF:   You have blood or brown flecks (like coffee grounds) in your vomit.  You have black or bloody stools.  You have a severe headache or stiff neck.  You are confused.  You have severe abdominal pain.  You have chest pain or trouble breathing.  You do not urinate at least once every 8 hours.  You develop cold or clammy skin.  You continue to vomit for longer than 24 to 48 hours.  You have a fever. MAKE SURE YOU:   Understand these instructions.  Will watch your condition.  Will get help right away if you are not doing well or get worse.   This information is not intended to replace advice given to you by your health care provider. Make sure you discuss any questions you have with your health care  provider.   Document Released: 06/07/2005 Document Revised: 08/30/2011 Document Reviewed: 11/04/2010 Elsevier Interactive Patient Education 2016 [employer]  Urinary Tract Infection Urinary tract infections (UTIs) can develop anywhere along your urinary tract. Your urinary tract is your body's drainage system for removing wastes and extra water. Your urinary tract includes two kidneys, two ureters, a bladder, and a urethra. Your kidneys are a pair of bean-shaped organs. Each kidney is about the size of your fist. They  are located below your ribs, one on each side of your spine. CAUSES Infections are caused by microbes, which are microscopic organisms, including fungi, viruses, and bacteria. These organisms are so small that they can only be seen through a microscope. Bacteria are the microbes that most commonly cause UTIs. SYMPTOMS  Symptoms of UTIs may vary by age and gender of the patient and by the location of the infection. Symptoms in young women typically include a frequent and intense urge to urinate and a painful, burning feeling in the bladder or urethra during urination. Older women and men are more likely to be tired, shaky, and weak and have muscle aches and abdominal pain. A fever may mean the infection is in your kidneys. Other symptoms of a kidney infection include pain in your back or sides below the ribs, nausea, and vomiting. DIAGNOSIS To diagnose a UTI, your caregiver will ask you about your symptoms. Your caregiver will also ask you to provide a urine sample. The urine sample will be tested for bacteria and white blood cells. White blood cells are made by your body to help fight infection. TREATMENT  Typically, UTIs can be treated with medication. Because most UTIs are caused by a bacterial infection, they usually can be treated with the use of antibiotics. The choice of antibiotic and length of treatment depend on your symptoms and the type of bacteria causing your infection. HOME CARE INSTRUCTIONS  If you were prescribed antibiotics, take them exactly as your caregiver instructs you. Finish the medication even if you feel better after you have only taken some of the medication.  Drink enough water and fluids to keep your urine clear or pale yellow.  Avoid caffeine, tea, and carbonated beverages. They tend to irritate your bladder.  Empty your bladder often. Avoid holding urine for long periods of time.  Empty your bladder before and after sexual intercourse.  After a bowel movement,  women should cleanse from front to back. Use each tissue only once. SEEK MEDICAL CARE IF:   You have back pain.  You develop a fever.  Your symptoms do not begin to resolve within 3 days. SEEK IMMEDIATE MEDICAL CARE IF:   You have severe back pain or lower abdominal pain.  You develop chills.  You have nausea or vomiting.  You have continued burning or discomfort with urination. MAKE SURE YOU:   Understand these instructions.  Will watch your condition.  Will get help right away if you are not doing well or get worse.   This information is not intended to replace advice given to you by your health care provider. Make sure you discuss any questions you have with your health care provider.   Document Released: 03/17/2005 Document Revised: 02/26/2015 Document Reviewed: 07/16/2011 Elsevier Interactive Patient Education 2016 [employer] Inc[employer]  Atrial Fibrillation Atrial fibrillation is a type of irregular or rapid heartbeat (arrhythmia). In atrial fibrillation, the heart quivers continuously in a chaotic pattern. This occurs when parts of the heart receive disorganized signals that make the heart unable  to pump blood normally. This can increase the risk for stroke, heart failure, and other heart-related conditions. There are different types of atrial fibrillation, including:  Paroxysmal atrial fibrillation. This type starts suddenly, and it usually stops on its own shortly after it starts.  Persistent atrial fibrillation. This type often lasts longer than a week. It may stop on its own or with treatment.  Long-lasting persistent atrial fibrillation. This type lasts longer than 12 months.  Permanent atrial fibrillation. This type does not go away. Talk with your health care provider to learn about the type of atrial fibrillation that you have. CAUSES This condition is caused by some heart-related conditions or procedures, including:  A heart attack.  Coronary artery  disease.  Heart failure.  Heart valve conditions.  High blood pressure.  Inflammation of the sac that surrounds the heart (pericarditis).  Heart surgery.  Certain heart rhythm disorders, such as Wolf-Parkinson-White syndrome. Other causes include:  Pneumonia.  Obstructive sleep apnea.  Blockage of an artery in the lungs (pulmonary embolism, or PE).  Lung cancer.  Chronic lung disease.  Thyroid problems, especially if the thyroid is overactive (hyperthyroidism).  Caffeine.  Excessive alcohol use or illegal drug use.  Use of some medicines, including certain decongestants and diet pills. Sometimes, the cause cannot be found. RISK FACTORS This condition is more likely to develop in:  People who are older in age.  People who smoke.  People who have diabetes mellitus.  People who are overweight (obese).  Athletes who exercise vigorously. SYMPTOMS Symptoms of this condition include:  A feeling that your heart is beating rapidly or irregularly.  A feeling of discomfort or pain in your chest.  Shortness of breath.  Sudden light-headedness or weakness.  Getting tired easily during exercise. In some cases, there are no symptoms. DIAGNOSIS Your health care provider may be able to detect atrial fibrillation when taking your pulse. If detected, this condition may be diagnosed with:  An electrocardiogram (ECG).  A Holter monitor test that records your heartbeat patterns over a 24-hour period.  Transthoracic echocardiogram (TTE) to evaluate how blood flows through your heart.  Transesophageal echocardiogram (TEE) to view more detailed images of your heart.  A stress test.  Imaging tests, such as a CT scan or chest X-ray.  Blood tests. TREATMENT The main goals of treatment are to prevent blood clots from forming and to keep your heart beating at a normal rate and rhythm. The type of treatment that you receive depends on many factors, such as your underlying  medical conditions and how you feel when you are experiencing atrial fibrillation. This condition may be treated with:  Medicine to slow down the heart rate, bring the heart's rhythm back to normal, or prevent clots from forming.  Electrical cardioversion. This is a procedure that resets your heart's rhythm by delivering a controlled, low-energy shock to the heart through your skin.  Different types of ablation, such as catheter ablation, catheter ablation with pacemaker, or surgical ablation. These procedures destroy the heart tissues that send abnormal signals. When the pacemaker is used, it is placed under your skin to help your heart beat in a regular rhythm. HOME CARE INSTRUCTIONS  Take over-the counter and prescription medicines only as told by your health care provider.  If your health care provider prescribed a blood-thinning medicine (anticoagulant), take it exactly as told. Taking too much blood-thinning medicine can cause bleeding. If you do not take enough blood-thinning medicine, you will not have the protection that  you need against stroke and other problems.  Do not use tobacco products, including cigarettes, chewing tobacco, and e-cigarettes. If you need help quitting, ask your health care provider.  If you have obstructive sleep apnea, manage your condition as told by your health care provider.  Do not drink alcohol.  Do not drink beverages that contain caffeine, such as coffee, soda, and tea.  Maintain a healthy weight. Do not use diet pills unless your health care provider approves. Diet pills may make heart problems worse.  Follow diet instructions as told by your health care provider.  Exercise regularly as told by your health care provider.  Keep all follow-up visits as told by your health care provider. This is important. PREVENTION  Avoid drinking beverages that contain caffeine or alcohol.  Avoid certain medicines, especially medicines that are used for  breathing problems.  Avoid certain herbs and herbal medicines, such as those that contain ephedra or ginseng.  Do not use illegal drugs, such as cocaine and amphetamines.  Do not smoke.  Manage your high blood pressure. SEEK MEDICAL CARE IF:  You notice a change in the rate, rhythm, or strength of your heartbeat.  You are taking an anticoagulant and you notice increased bruising.  You tire more easily when you exercise or exert yourself. SEEK IMMEDIATE MEDICAL CARE IF:  You have chest pain, abdominal pain, sweating, or weakness.  You feel nauseous.  You notice blood in your vomit, bowel movement, or urine.  You have shortness of breath.  You suddenly have swollen feet and ankles.  You feel dizzy.  You have sudden weakness or numbness of the face, arm, or leg, especially on one side of the body.  You have trouble speaking, trouble understanding, or both (aphasia).  Your face or your eyelid droops on one side. These symptoms may represent a serious problem that is an emergency. Do not wait to see if the symptoms will go away. Get medical help right away. Call your local emergency services (911 in the [place] Do not drive yourself to the hospital.   This information is not intended to replace advice given to you by your health care provider. Make sure you discuss any questions you have with your health care provider.   Document Released: 06/07/2005 Document Revised: 02/26/2015 Document Reviewed: 10/02/2014 Elsevier Interactive Patient Education [employer].

## 2015-06-08 NOTE — ED Notes (Signed)
She is drowsy and comfortable and has no requests at this time.

## 2015-06-08 NOTE — ED Notes (Signed)
Pt report onset of n/v since Friday morning past with less than ten emesis since onset but more than ten diarrhea stools since Friday. Also reports generalized malaise and fatique denies dizziness.

## 2015-06-08 NOTE — ED Notes (Signed)
Pt refusing straight cath for urine specimen she was informed that this is holding up her disposition pt continues to refuse

## 2015-06-08 NOTE — ED Provider Notes (Signed)
By signing my name below, I, Hansel Feinstein, attest that this documentation has been prepared under the direction and in the presence of [place], DO. Electronically Signed: Hansel Feinstein, ED Scribe. 06/08/2015. 2:04 AM.   TIME SEEN: 1:56 AM   CHIEF COMPLAINT:  Chief Complaint  Patient presents with  . Nausea  . Emesis    HPI:  HPI Comments: Sierra [name] is a 69 y.o. female with h/o cholecystectomy, hital hernia repair, vaginal hysterectomy who presents to the Emergency Department complaining of moderate, intermittent diarrhea onset 2 days ago (6 episodes yesterday) with associated emesis onset 2 days ago (no emesis yesterday), abdominal pain, nausea.  She reports sick contact with her husband who had diarrhea, but no emesis. No recent travel outside of the country or recent hospitalizations. She denies fever, chills, melena, hematochezia, lightheadedness. No dysuria, hematuria, vaginal bleeding or discharge.  ROS: See HPI Constitutional: no fever, chills Eyes: no drainage  ENT: no runny nose   Cardiovascular:  no chest pain  Resp: no SOB  GI: No melena, hematochezia. Positive for vomiting, diarrhea, abdominal pain, nausea GU: no dysuria Integumentary: no rash  Allergy: no hives  Musculoskeletal: no leg swelling  Neurological: no slurred speech, lightheadedness  ROS otherwise negative  PAST MEDICAL HISTORY/PAST SURGICAL HISTORY:  Past Medical History  Diagnosis Date  . Hypertension   . Anxiety   . GERD (gastroesophageal reflux disease)   . H/O hiatal hernia   . Pneumonia ~ 2012  . Migraine     "haven't had any in a long time" (09/19/2013)  . Arthritis     "joints" (09/19/2013)  . Chronic lower back pain   . Basal cell carcinoma of cheek     left    MEDICATIONS:  Prior to Admission medications   Medication Sig Start Date End Date Taking? Authorizing Provider  amLODipine (NORVASC) 10 MG tablet Take 10 mg by mouth daily.    Historical Provider, MD  aspirin EC 325 MG  tablet Take 1 tablet (325 mg total) by mouth 2 (two) times daily. 09/19/13   Leighton Parody, PA-C  Bioflavonoid Products (ESTER C PO) Take 500 mg by mouth daily.    Historical Provider, MD  Cholecalciferol (VITAMIN D-3) 5000 UNITS TABS Take 5,000 Units by mouth daily.    Historical Provider, MD  Cinnamon 500 MG TABS Take 500 mg by mouth daily.    Historical Provider, MD  citalopram (CELEXA) 40 MG tablet Take 40 mg by mouth daily.    Historical Provider, MD  colestipol (COLESTID) 5 G granules Take 5 g by mouth 2 (two) times daily.    Historical Provider, MD  diphenhydrAMINE (BENADRYL) 25 mg capsule Take 25 mg by mouth at bedtime as needed for sleep.    Historical Provider, MD  gabapentin (NEURONTIN) 600 MG tablet Take 600 mg by mouth 3 (three) times daily.    Historical Provider, MD  glucosamine-chondroitin 500-400 MG tablet Take 1 tablet by mouth 3 (three) times daily.    Historical Provider, MD  hydrochlorothiazide (HYDRODIURIL) 25 MG tablet Take 25 mg by mouth daily.    Historical Provider, MD  methocarbamol (ROBAXIN) 500 MG tablet Take 1 tablet (500 mg total) by mouth 2 (two) times daily with a meal. 09/19/13   Leighton Parody, PA-C  metoprolol succinate (TOPROL-XL) 50 MG 24 hr tablet Take 50 mg by mouth daily. Take with or immediately following a meal.    Historical Provider, MD  Multiple Vitamins-Minerals (MULTIVITAMIN WITH MINERALS) tablet Take  1 tablet by mouth daily.    Historical Provider, MD  nabumetone (RELAFEN) 500 MG tablet Take 500 mg by mouth 2 (two) times daily.    Historical Provider, MD  olmesartan (BENICAR) 20 MG tablet Take 20 mg by mouth daily.    Historical Provider, MD  oxyCODONE-acetaminophen (ROXICET) 5-325 MG per tablet Take 1 tablet by mouth every 4 (four) hours as needed. 09/19/13   Leighton Parody, PA-C  RABEprazole (ACIPHEX) 20 MG tablet Take 20 mg by mouth daily. Except take 20 mg twice a day on Monday,wed, & fridays    Historical Provider, MD  terazosin (HYTRIN) 10 MG  capsule Take 10 mg by mouth at bedtime.    Historical Provider, MD  vitamin B-12 (CYANOCOBALAMIN) 500 MCG tablet Take 500 mcg by mouth daily.    Historical Provider, MD    ALLERGIES:  Allergies  Allergen Reactions  . Ace Inhibitors Cough  . Codeine Nausea Only  . Erythromycin     Stomach pain  . Iohexol      Code: HIVES, Desc: patient states develops hives w/ iv contrast/mms     SOCIAL HISTORY:  Social History  Substance Use Topics  . Smoking status: Never Smoker   . Smokeless tobacco: Never Used  . Alcohol Use: Yes     Comment: 09/19/2013 "2-3 glasses of wine/month"    FAMILY HISTORY: History reviewed. No pertinent family history.  EXAM: BP 126/62 mmHg  Pulse 84  Temp(Src) 97.8 F (36.6 C) (Oral)  Resp 18  Ht 5\' 7"  (1.702 m)  Wt 185 lb (83.915 kg)  BMI 28.97 kg/m2  SpO2 96%  CONSTITUTIONAL: Alert and oriented and responds appropriately to questions. Well-appearing; well-nourished HEAD: Normocephalic EYES: Conjunctivae clear, PERRL ENT: normal nose; no rhinorrhea; dry mucous membranes; pharynx without lesions noted NECK: Supple, no meningismus, no LAD  CARD: Irregularly irregular; S1 and S2 appreciated; no murmurs, no clicks, no rubs, no gallops RESP: Normal chest excursion without splinting or tachypnea; breath sounds clear and equal bilaterally; no wheezes, no rhonchi, no rales, no hypoxia or respiratory distress, speaking full sentences ABD/GI: Tender at McBurney's point with voluntary guarding. Normal bowel sounds; non-distended; soft, no rebound, no peritoneal signs BACK:  The back appears normal and is non-tender to palpation, there is no CVA tenderness EXT: Normal ROM in all joints; non-tender to palpation; no edema; normal capillary refill; no cyanosis, no calf tenderness or swelling    SKIN: Normal color for age and race; warm NEURO: Moves all extremities equally, sensation to light touch intact diffusely, cranial nerves II through XII intact PSYCH: The  patient's mood and manner are appropriate. Grooming and personal hygiene are appropriate.  MEDICAL DECISION MAKING: Patient here with likely viral gastroenteritis however she is tender in the right lower quadrant with voluntary guarding. We'll obtain CT of her abdomen and pelvis for further evaluation. Will give IV fluids, Zofran, Imodium and reassess. Labs, urine pending.  ED PROGRESS: Labs show mild hypokalemia with potassium of 3.1 which has been replaced. No leukocytosis. Normal creatinine, LFTs and lipase. CT scan shows no acute abnormality, normal appendix. Urine is still pending. We'll give second liter of IV fluids. She has been able to ambulate and drink without difficulty.   Urine shows nitrite positive urinary tract infection. Culture pending. Given ceftriaxone in the emergency department will discharge on Keflex. Still no vomiting but has had 2-3 episodes of nonbloody diarrhea. I recommended Imodium over-the-counter for diarrhea at home. We'll discharge with Zofran and Bentyl to use as  needed. Recommended increase oral fluid intake. Discussed return precautions. Patient verbalizes understanding and is comfortable with this plan.    Of note, patient was found in atrial fibrillation. She is rate controlled. Denies any chest pain or short is of breath. She has never been told she has atrial fibrillation before. I have sent a referral for cardiology clinic for follow-up. She does have a CHADS-VASC2 of 2.  Discussed with her that she is at risk for stroke if she stays in atrial fibrillation. She states she would like to discuss this with her cardiologist prior to starting anticoagulation. I recommend close follow-up. Her husband sees Dr. Irish Lack and they are very pleased with his care and would like for her to follow-up with the same position. I have provided her with outpatient cardiology follow-up information and I discussed with them that they have not heard back from anybody by late Monday  afternoon that they should call to schedule an appointment.        EKG Interpretation  Date/Time:  Sunday June 08 2015 02:16:52 EST Ventricular Rate:  82 PR Interval:    QRS Duration: 98 QT Interval:  368 QTC Calculation: 430 R Axis:   69 Text Interpretation:  Atrial fibrillation Nonspecific repol abnormality, diffuse leads Confirmed by Nareg Breighner,  DO, Ranessa Kosta [id number]) on 06/08/2015 6:57:10 AM         I personally performed the services described in this documentation, which was scribed in my presence. The recorded information has been reviewed and is accurate.   [place], DO 06/08/15 [phone]

## 2015-06-08 NOTE — ED Notes (Signed)
Bed: [id number] Expected date:  Expected time:  Means of arrival:  Comments: N,V,D

## 2015-06-10 DIAGNOSIS — R002 Palpitations: Secondary | ICD-10-CM | POA: Diagnosis not present

## 2015-06-10 DIAGNOSIS — E876 Hypokalemia: Secondary | ICD-10-CM | POA: Diagnosis not present

## 2015-06-10 DIAGNOSIS — I10 Essential (primary) hypertension: Secondary | ICD-10-CM | POA: Diagnosis not present

## 2015-06-10 DIAGNOSIS — N390 Urinary tract infection, site not specified: Secondary | ICD-10-CM | POA: Diagnosis not present

## 2015-06-10 LAB — URINE CULTURE: Culture: >=100000

## 2015-06-11 ENCOUNTER — Telehealth (HOSPITAL_COMMUNITY): Payer: Self-pay

## 2015-06-11 DIAGNOSIS — I4891 Unspecified atrial fibrillation: Secondary | ICD-10-CM | POA: Insufficient documentation

## 2015-06-11 DIAGNOSIS — I480 Paroxysmal atrial fibrillation: Secondary | ICD-10-CM | POA: Insufficient documentation

## 2015-06-11 NOTE — Telephone Encounter (Signed)
Post ED Visit - Positive Culture Follow-up  Culture report reviewed by antimicrobial stewardship pharmacist:  [x]  Elenor Quinones, Pharm.D. []  Heide Guile, Pharm.D., BCPS []  Parks Neptune, Pharm.D. []  Alycia Rossetti, Pharm.D., BCPS []  [place], Pharm.D., BCPS, AAHIVP []  Legrand Como, Pharm.D., BCPS, AAHIVP []  Milus Glazier, Pharm.D. []  Stephens November, Pharm.D.  Positive urine culture, >/= 100,000 colonies -> E Coli Treated with Amoxicillin and Cephalexin, organism sensitive to the same and no further patient follow-up is required at this time.  Dortha Kern 06/11/2015, 9:50 AM

## 2015-06-12 ENCOUNTER — Ambulatory Visit (INDEPENDENT_AMBULATORY_CARE_PROVIDER_SITE_OTHER): Payer: Medicare Other

## 2015-06-12 DIAGNOSIS — I4891 Unspecified atrial fibrillation: Secondary | ICD-10-CM

## 2015-06-20 ENCOUNTER — Telehealth: Payer: Self-pay | Admitting: Interventional Cardiology

## 2015-06-20 NOTE — Telephone Encounter (Signed)
Informed patient that Dr. Delene Ruffini office will call with results and recommendations when they are resulted. Scheduled patient 2/6 with Dr. Irish Lack as New Patient (there is a referral from ED and she wishes to see Dr. Irish Lack only).

## 2015-06-20 NOTE — Telephone Encounter (Signed)
°  New Prob   Pt had Holter monitor placed on 12/22. She received a phone call from our office but pt could not recall who called her. She believes call may have been related to results or making an appointment with Dr. Irish Lack. She is requesting to speak to a nurse. Please call.

## 2015-06-23 DIAGNOSIS — H43813 Vitreous degeneration, bilateral: Secondary | ICD-10-CM | POA: Diagnosis not present

## 2015-06-23 DIAGNOSIS — Z961 Presence of intraocular lens: Secondary | ICD-10-CM | POA: Diagnosis not present

## 2015-06-23 DIAGNOSIS — H524 Presbyopia: Secondary | ICD-10-CM | POA: Diagnosis not present

## 2015-07-28 ENCOUNTER — Encounter: Payer: Self-pay | Admitting: Interventional Cardiology

## 2015-07-28 ENCOUNTER — Ambulatory Visit (INDEPENDENT_AMBULATORY_CARE_PROVIDER_SITE_OTHER): Payer: Medicare Other | Admitting: Interventional Cardiology

## 2015-07-28 VITALS — BP 122/64 | HR 65 | Ht 67.0 in | Wt 189.0 lb

## 2015-07-28 DIAGNOSIS — I491 Atrial premature depolarization: Secondary | ICD-10-CM

## 2015-07-28 DIAGNOSIS — I4891 Unspecified atrial fibrillation: Secondary | ICD-10-CM

## 2015-07-28 DIAGNOSIS — I10 Essential (primary) hypertension: Secondary | ICD-10-CM | POA: Diagnosis not present

## 2015-07-28 DIAGNOSIS — I480 Paroxysmal atrial fibrillation: Secondary | ICD-10-CM

## 2015-07-28 MED ORDER — ASPIRIN EC 81 MG PO TBEC: 81.0000 mg | DELAYED_RELEASE_TABLET | Freq: Every day | ORAL | Status: DC

## 2015-07-28 NOTE — Progress Notes (Signed)
Patient ID: Sierra [name], female   DOB: Feb 26, 1946, 70 y.o.   MRN: [id number]     Cardiology Office Note   Date:  07/28/2015   ID:  Skilar, Waage July 12, 1945, MRN [id number]  PCP:  Irven Shelling, MD    No chief complaint on file.  AFib  Wt Readings from Last 3 Encounters:  07/28/15 189 lb (85.73 kg)  06/08/15 185 lb (83.915 kg)  09/19/13 191 lb (86.637 kg)       History of Present Illness: Sierra [name] is a 70 y.o. female  Who has HTN, PACs for many years.  In September, she was feeling palpitations thought to be from PACs.  In December 2016, she had a severe GI bug and went to the ER.  She had atrial fibrillation diagnosed in the ambulance.    She has continued to have PACs, but fewer than before December.  No AFib symptoms.  However, when she had the AFib in December, she was quite sick and did not feel anything at that time.    No chest pain or SHOB.  She does not really exercise regularly, due to laziness.  SHe has never had an echocardiogram.      Past Medical History  Diagnosis Date  . Hypertension   . Anxiety   . GERD (gastroesophageal reflux disease)   . H/O hiatal hernia   . Pneumonia ~ 2012  . Migraine     "haven't had any in a long time" (09/19/2013)  . Arthritis     "joints" (09/19/2013)  . Chronic lower back pain   . Basal cell carcinoma of cheek     left    Past Surgical History  Procedure Laterality Date  . Dilation and curettage of uterus    . Cholecystectomy  1990's  . Hernia repair  [id number]    umbilical  . Shoulder arthroscopy w/ rotator cuff repair Right   . Tmj arthroplasty    . Oophorectomy Bilateral   . Joint replacement    . Revision total hip arthroplasty Left 09/19/2013  . Total hip arthroplasty Left 2009  . Total hip arthroplasty Right 2011  . Vaginal hysterectomy  1990's  . Tubal ligation    . Basal cell carcinoma excision Left     "cheek"  . Total hip revision Left 09/19/2013    Procedure: LEFT TOTAL HIP REVISION;  Surgeon:  Kerin Salen, MD;  Location: [facility];  Service: Orthopedics;  Laterality: Left;     Current Outpatient Prescriptions  Medication Sig Dispense Refill  . amLODipine (NORVASC) 10 MG tablet Take 10 mg by mouth daily.    . Cholecalciferol (VITAMIN D-3) 5000 UNITS TABS Take 5,000 Units by mouth every Monday, Wednesday, and Friday.     . citalopram (CELEXA) 40 MG tablet Take 40 mg by mouth daily.    . colestipol (COLESTID) 5 G granules Take 5 g by mouth 2 (two) times daily.    Marland Kitchen glucosamine-chondroitin 500-400 MG tablet Take 2 tablets by mouth daily.     . hydrochlorothiazide (HYDRODIURIL) 25 MG tablet Take 25 mg by mouth daily.    Marland Kitchen loperamide (IMODIUM) 2 MG capsule Take 2 mg by mouth as needed for diarrhea or loose stools.    . metoprolol succinate (TOPROL-XL) 50 MG 24 hr tablet Take 50 mg by mouth daily. Take with or immediately following a meal.    . Multiple Vitamins-Minerals (MULTIVITAMIN WITH MINERALS) tablet Take 1 tablet by mouth daily.    Marland Kitchen  nabumetone (RELAFEN) 500 MG tablet Take 500 mg by mouth 2 (two) times daily.    Marland Kitchen olmesartan (BENICAR) 20 MG tablet Take 20 mg by mouth daily.    . ondansetron (ZOFRAN ODT) 4 MG disintegrating tablet Take 1 tablet (4 mg total) by mouth every 8 (eight) hours as needed for nausea or vomiting. 20 tablet 0  . Probiotic Product (ALIGN) 4 MG CAPS Take 1 capsule by mouth daily.    . RABEprazole (ACIPHEX) 20 MG tablet Take 20 mg by mouth 2 (two) times daily.     Marland Kitchen terazosin (HYTRIN) 10 MG capsule Take 10 mg by mouth at bedtime.    . vitamin B-12 (CYANOCOBALAMIN) 500 MCG tablet Take 500 mcg by mouth every Monday, Wednesday, and Friday.      No current facility-administered medications for this visit.    Allergies:   Ace inhibitors; Codeine; Erythromycin; and Iohexol    Social History:  The patient  reports that she has never smoked. She has never used smokeless tobacco. She reports that she drinks alcohol. She reports that she does not use illicit drugs.    Family History:  The patient's family history includes Heart attack in her mother.    ROS:  Please see the history of present illness.   Otherwise, review of systems are positive for GI illness has improved.   All other systems are reviewed and negative.    PHYSICAL EXAM: VS:  BP 122/64 mmHg  Pulse 65  Ht 5\' 7"  (1.702 m)  Wt 189 lb (85.73 kg)  BMI 29.59 kg/m2 , BMI Body mass index is 29.59 kg/(m^2). GEN: Well nourished, well developed, in no acute distress HEENT: normal Neck: no JVD, carotid bruits, or masses Cardiac: RRR; no murmurs, rubs, or gallops,no edema  Respiratory:  clear to auscultation bilaterally, normal work of breathing GI: soft, nontender, nondistended, + BS MS: no deformity or atrophy Skin: warm and dry, no rash Neuro:  Strength and sensation are intact Psych: euthymic mood, full affect   EKG:   The ekg ordered today demonstrates NSR, nonspecific ST segment changes   Recent Labs: 06/08/2015: ALT 22; BUN 25*; Creatinine, Ser 0.90; Hemoglobin 12.3; Platelets 200; Potassium 3.1*; Sodium 139   Lipid Panel No results found for: CHOL, TRIG, HDL, CHOLHDL, VLDL, LDLCALC, LDLDIRECT   Other studies Reviewed: Additional studies/ records that were reviewed today with results demonstrating: Hospital records reviewed which include an ECG showing atrial fibrillation with controlled ventricular response.   ASSESSMENT AND PLAN:  1. Atrial fibrillation: She had atrial fibrillation in the midst of a severe GI bug. She was dehydrated. Atrial fibrillation resolved with rehydration. She has not had any symptoms of prolonged palpitations although she did not have symptoms when in atrial fibrillation in the emergency room. We discussed long-term anticoagulation. Given that she has had only one episode of atrial fib while ill from her GI problems, she would like to hold off. Will check echocardiogram. If there is no structural heart disease, would just continue her on low-dose  aspirin. If there is any significant structural heart disease that would make her more prone atrial fibrillation, would have to reconsider anticoagulation. We went over in detail the fact that her chads-vasc is elevated due to the presence of hypertension, her age and her gender. 2. Hypertension: Well controlled. Continue current medications 3. PACs: She continues to have episodic skipped beats but checks her pulse and feels that her pulse is predominantly regular. If she notes any prolonged irregular pulse, she should  let us know. This patients CHA2DS2-VASc Score and unadjusted Ischemic Stroke Rate (% per year) is equal to 3.2 % stroke rate/year from a score of 3  Above score calculated as 1 point each if present [CHF, HTN, DM, Vascular=MI/PAD/Aortic Plaque, Age if 65-74, or Female] Above score calculated as 2 points each if present [Age > 75, or Stroke/TIA/TE]     Current medicines are reviewed at length with the patient today.  The patient concerns regarding her medicines were addressed.  The following changes have been made:  No change  Labs/ tests ordered today include: Echocardiogram  No orders of the defined types were placed in this encounter.    Recommend 150 minutes/week of aerobic exercise Low fat, low carb, high fiber diet recommended  Disposition:   FU in 6 months   Teresita Madura., MD  07/28/2015 12:21 PM    [facility] [address], [place], [place]  [id number] Phone: [phone]; Fax: [phone]

## 2015-07-28 NOTE — Patient Instructions (Signed)
Medication Instructions:  Your physician has recommended you make the following change in your medication:  1.  START the Aspirin 81 mg taking 1 tablet daily   Labwork: NONE ORDERED  Testing/Procedures: Your physician has requested that you have an echocardiogram. Echocardiography is a painless test that uses sound waves to create images of your heart. It provides your doctor with information about the size and shape of your heart and how well your heart's chambers and valves are working. This procedure takes approximately one hour. There are no restrictions for this procedure.    Follow-Up: Your physician wants you to follow-up in: [place] DR. VARANASI.  You will receive a reminder letter in the mail two months in advance. If you don't receive a letter, please call our office to schedule the follow-up appointment.   Any Other Special Instructions Will Be Listed Below (If Applicable).     If you need a refill on your cardiac medications before your next appointment, please call your pharmacy.

## 2015-07-29 DIAGNOSIS — I491 Atrial premature depolarization: Secondary | ICD-10-CM | POA: Insufficient documentation

## 2015-07-29 DIAGNOSIS — I10 Essential (primary) hypertension: Secondary | ICD-10-CM | POA: Insufficient documentation

## 2015-08-04 DIAGNOSIS — L820 Inflamed seborrheic keratosis: Secondary | ICD-10-CM | POA: Diagnosis not present

## 2015-08-08 ENCOUNTER — Other Ambulatory Visit: Payer: Self-pay

## 2015-08-08 ENCOUNTER — Ambulatory Visit (HOSPITAL_COMMUNITY): Payer: Medicare Other | Attending: Cardiology

## 2015-08-08 DIAGNOSIS — I340 Nonrheumatic mitral (valve) insufficiency: Secondary | ICD-10-CM | POA: Diagnosis not present

## 2015-08-08 DIAGNOSIS — I371 Nonrheumatic pulmonary valve insufficiency: Secondary | ICD-10-CM | POA: Diagnosis not present

## 2015-08-08 DIAGNOSIS — I071 Rheumatic tricuspid insufficiency: Secondary | ICD-10-CM | POA: Diagnosis not present

## 2015-08-08 DIAGNOSIS — I4891 Unspecified atrial fibrillation: Secondary | ICD-10-CM | POA: Insufficient documentation

## 2015-08-08 DIAGNOSIS — I119 Hypertensive heart disease without heart failure: Secondary | ICD-10-CM | POA: Insufficient documentation

## 2015-08-12 ENCOUNTER — Telehealth: Payer: Self-pay | Admitting: Interventional Cardiology

## 2015-08-12 DIAGNOSIS — Z1231 Encounter for screening mammogram for malignant neoplasm of breast: Secondary | ICD-10-CM | POA: Diagnosis not present

## 2015-08-12 NOTE — Telephone Encounter (Signed)
Pt is aware of echo results, and MD's recommendations. Pt verbalized understanding.

## 2015-08-12 NOTE — Telephone Encounter (Signed)
New Message ° °Pt called for ECHO results °

## 2015-09-23 DIAGNOSIS — R05 Cough: Secondary | ICD-10-CM | POA: Diagnosis not present

## 2015-09-23 DIAGNOSIS — I10 Essential (primary) hypertension: Secondary | ICD-10-CM | POA: Diagnosis not present

## 2015-09-23 DIAGNOSIS — K219 Gastro-esophageal reflux disease without esophagitis: Secondary | ICD-10-CM | POA: Diagnosis not present

## 2015-11-07 DIAGNOSIS — L820 Inflamed seborrheic keratosis: Secondary | ICD-10-CM | POA: Diagnosis not present

## 2015-11-07 DIAGNOSIS — L570 Actinic keratosis: Secondary | ICD-10-CM | POA: Diagnosis not present

## 2016-02-02 ENCOUNTER — Encounter: Payer: Self-pay | Admitting: Interventional Cardiology

## 2016-02-02 ENCOUNTER — Ambulatory Visit (INDEPENDENT_AMBULATORY_CARE_PROVIDER_SITE_OTHER): Payer: Medicare Other | Admitting: Interventional Cardiology

## 2016-02-02 VITALS — BP 130/60 | HR 67 | Ht 67.0 in | Wt 189.0 lb

## 2016-02-02 DIAGNOSIS — I10 Essential (primary) hypertension: Secondary | ICD-10-CM | POA: Diagnosis not present

## 2016-02-02 DIAGNOSIS — I480 Paroxysmal atrial fibrillation: Secondary | ICD-10-CM | POA: Diagnosis not present

## 2016-02-02 DIAGNOSIS — I491 Atrial premature depolarization: Secondary | ICD-10-CM

## 2016-02-02 NOTE — Progress Notes (Signed)
Patient ID: Sierra [name], female   DOB: August 31, 1945, 70 y.o.   MRN: [id number]     Cardiology Office Note   Date:  02/02/2016   ID:  Sierra [name] 10/16/1945, MRN [id number]  PCP:  Sierra Shelling, MD    No chief complaint on file.  AFib  Wt Readings from Last 3 Encounters:  02/02/16 189 lb (85.7 kg)  07/28/15 189 lb (85.7 kg)  06/08/15 185 lb (83.9 kg)       History of Present Illness: Sierra [name] is a 70 y.o. female  Who has HTN, PACs for many years.  In September, she was feeling palpitations thought to be from PACs.  In December 2016, she had a severe GI bug and went to the ER.  She had atrial fibrillation diagnosed in the ambulance.    She has continued to have PACs, but fewer than before December.  No AFib symptoms.  However, when she had the AFib in December, she was quite sick and did not feel anything at that time.    No chest pain or SHOB.  She does not really exercise regularly, due to laziness.  She occasionally walks in the pool.   She knows she can walk in stores.        Past Medical History:  Diagnosis Date  . Anxiety   . Arthritis    "joints" (09/19/2013)  . Basal cell carcinoma of cheek    left  . Chronic lower back pain   . GERD (gastroesophageal reflux disease)   . H/O hiatal hernia   . Hypertension   . Migraine    "haven't had any in a long time" (09/19/2013)  . Pneumonia ~ 2012    Past Surgical History:  Procedure Laterality Date  . BASAL CELL CARCINOMA EXCISION Left    "cheek"  . CHOLECYSTECTOMY  1990's  . DILATION AND CURETTAGE OF UTERUS    . HERNIA REPAIR  [id number]   umbilical  . JOINT REPLACEMENT    . OOPHORECTOMY Bilateral   . REVISION TOTAL HIP ARTHROPLASTY Left 09/19/2013  . SHOULDER ARTHROSCOPY W/ ROTATOR CUFF REPAIR Right   . TMJ ARTHROPLASTY    . TOTAL HIP ARTHROPLASTY Left 2009  . TOTAL HIP ARTHROPLASTY Right 2011  . TOTAL HIP REVISION Left 09/19/2013   Procedure: LEFT TOTAL HIP REVISION;  Surgeon: Kerin Salen, MD;   Location: [facility];  Service: Orthopedics;  Laterality: Left;  . TUBAL LIGATION    . VAGINAL HYSTERECTOMY  1990's     Current Outpatient Prescriptions  Medication Sig Dispense Refill  . amLODipine (NORVASC) 10 MG tablet Take 10 mg by mouth daily.    Marland Kitchen aspirin EC 81 MG tablet Take 1 tablet (81 mg total) by mouth daily. 90 tablet 3  . Cholecalciferol (VITAMIN D-3) 5000 UNITS TABS Take 5,000 Units by mouth every Monday, Wednesday, and Friday.     . citalopram (CELEXA) 40 MG tablet Take 40 mg by mouth daily.    . colestipol (COLESTID) 5 G granules Take 5 g by mouth 2 (two) times daily.    Marland Kitchen glucosamine-chondroitin 500-400 MG tablet Take 2 tablets by mouth daily.     . hydrochlorothiazide (HYDRODIURIL) 25 MG tablet Take 25 mg by mouth daily.    Marland Kitchen loperamide (IMODIUM) 2 MG capsule Take 2 mg by mouth as needed for diarrhea or loose stools.    . metoprolol succinate (TOPROL-XL) 50 MG 24 hr tablet Take 50 mg by mouth daily. Take with or  immediately following a meal.    . Multiple Vitamins-Minerals (MULTIVITAMIN WITH MINERALS) tablet Take 1 tablet by mouth daily.    . nabumetone (RELAFEN) 500 MG tablet Take 500 mg by mouth 2 (two) times daily.    Marland Kitchen olmesartan (BENICAR) 20 MG tablet Take 20 mg by mouth daily.    . ondansetron (ZOFRAN ODT) 4 MG disintegrating tablet Take 1 tablet (4 mg total) by mouth every 8 (eight) hours as needed for nausea or vomiting. 20 tablet 0  . Probiotic Product (ALIGN) 4 MG CAPS Take 1 capsule by mouth daily.    . RABEprazole (ACIPHEX) 20 MG tablet Take 20 mg by mouth 2 (two) times daily. TAKE 2 CAPS 4 DAYS WEEKLY AND TAKE 1 CAP 3 DAYS WEEKLY    . terazosin (HYTRIN) 10 MG capsule Take 10 mg by mouth at bedtime.    . vitamin B-12 (CYANOCOBALAMIN) 500 MCG tablet Take 500 mcg by mouth every Monday, Wednesday, and Friday.      No current facility-administered medications for this visit.     Allergies:   Ace inhibitors; Codeine; Erythromycin; and Iohexol    Social History:   The patient  reports that she has never smoked. She has never used smokeless tobacco. She reports that she drinks alcohol. She reports that she does not use drugs.   Family History:  The patient's family history includes Heart attack in her maternal aunt, mother, and paternal uncle.    ROS:  Please see the history of present illness.   Otherwise, review of systems are positive for GI illness has improved.   All other systems are reviewed and negative.    PHYSICAL EXAM: VS:  BP 130/60   Pulse 67   Ht 5\' 7"  (1.702 m)   Wt 189 lb (85.7 kg)   BMI 29.60 kg/m  , BMI Body mass index is 29.6 kg/m. GEN: Well nourished, well developed, in no acute distress  HEENT: normal  Neck: no JVD, carotid bruits, or masses Cardiac: RRR; no murmurs, rubs, or gallops,no edema  Respiratory:  clear to auscultation bilaterally, normal work of breathing GI: soft, nontender, nondistended, + BS MS: no deformity or atrophy  Skin: warm and dry, no rash Neuro:  Strength and sensation are intact Psych: euthymic mood, full affect   EKG:   The ekg ordered today demonstrates NSR, nonspecific ST segment changes   Recent Labs: 06/08/2015: ALT 22; BUN 25; Creatinine, Ser 0.90; Hemoglobin 12.3; Platelets 200; Potassium 3.1; Sodium 139   Lipid Panel No results found for: CHOL, TRIG, HDL, CHOLHDL, VLDL, LDLCALC, LDLDIRECT   Other studies Reviewed: Additional studies/ records that were reviewed today with results demonstrating: Hospital records reviewed which include an ECG showing atrial fibrillation with controlled ventricular response.   ASSESSMENT AND PLAN:  1. Atrial fibrillation: She had atrial fibrillation in the midst of a severe GI bug. She was dehydrated. Atrial fibrillation resolved with rehydration. She has not had any symptoms of prolonged palpitations although she did not have symptoms when in atrial fibrillation in the emergency room. We discussed long-term anticoagulation. Given that she has had only  one episode of atrial fib while ill from her GI problems, we held off. Echocardiogram showed no structural heart disease. If there had been any significant structural heart disease that would make her more prone atrial fibrillation, we would have had to reconsider anticoagulation. We went over in detail the fact that her chads-vasc is elevated due to the presence of hypertension, her age and her gender.  She has only had PACs to date.  She would prefer not to take a blood thinner.  2. Hypertension: Well controlled. Continue current medications 3. PACs: She continues to have episodic skipped beats but checks her pulse and feels that her pulse is predominantly regular. If she notes any prolonged irregular pulse, she should let us know. This patients CHA2DS2-VASc Score and unadjusted Ischemic Stroke Rate (% per year) is equal to 3.2 % stroke rate/year from a score of 3  Above score calculated as 1 point each if present [CHF, HTN, DM, Vascular=MI/PAD/Aortic Plaque, Age if 65-74, or Female] Above score calculated as 2 points each if present [Age > 75, or Stroke/TIA/TE]     Current medicines are reviewed at length with the patient today.  The patient concerns regarding her medicines were addressed.  The following changes have been made:  No change  Labs/ tests ordered today include: Echocardiogram  No orders of the defined types were placed in this encounter.   Recommend 150 minutes/week of aerobic exercise Low fat, low carb, high fiber diet recommended  Disposition:   FU in prn   Signed, Larae Grooms, MD  02/02/2016 2:38 PM    [facility] [place], [place],   [address] Phone: [phone]; Fax: [phone]

## 2016-02-02 NOTE — Patient Instructions (Signed)
**Note De-identified Asser Lucena Obfuscation** Medication Instructions:  Same-no changes  Labwork: None  Testing/Procedures: None  Follow-Up: As needed     If you need a refill on your cardiac medications before your next appointment, please call your pharmacy.   

## 2016-03-16 DIAGNOSIS — R490 Dysphonia: Secondary | ICD-10-CM | POA: Diagnosis not present

## 2016-03-16 DIAGNOSIS — K219 Gastro-esophageal reflux disease without esophagitis: Secondary | ICD-10-CM | POA: Diagnosis not present

## 2016-03-17 DIAGNOSIS — Z23 Encounter for immunization: Secondary | ICD-10-CM | POA: Diagnosis not present

## 2016-03-26 DIAGNOSIS — Z5181 Encounter for therapeutic drug level monitoring: Secondary | ICD-10-CM | POA: Diagnosis not present

## 2016-03-26 DIAGNOSIS — Z1389 Encounter for screening for other disorder: Secondary | ICD-10-CM | POA: Diagnosis not present

## 2016-03-26 DIAGNOSIS — K219 Gastro-esophageal reflux disease without esophagitis: Secondary | ICD-10-CM | POA: Diagnosis not present

## 2016-03-26 DIAGNOSIS — Z79899 Other long term (current) drug therapy: Secondary | ICD-10-CM | POA: Diagnosis not present

## 2016-03-26 DIAGNOSIS — I10 Essential (primary) hypertension: Secondary | ICD-10-CM | POA: Diagnosis not present

## 2016-03-26 DIAGNOSIS — F419 Anxiety disorder, unspecified: Secondary | ICD-10-CM | POA: Diagnosis not present

## 2016-03-26 DIAGNOSIS — Z1211 Encounter for screening for malignant neoplasm of colon: Secondary | ICD-10-CM | POA: Diagnosis not present

## 2016-03-26 DIAGNOSIS — E559 Vitamin D deficiency, unspecified: Secondary | ICD-10-CM | POA: Diagnosis not present

## 2016-04-06 DIAGNOSIS — Z1212 Encounter for screening for malignant neoplasm of rectum: Secondary | ICD-10-CM | POA: Diagnosis not present

## 2016-04-06 DIAGNOSIS — Z1211 Encounter for screening for malignant neoplasm of colon: Secondary | ICD-10-CM | POA: Diagnosis not present

## 2016-04-14 DIAGNOSIS — Z01419 Encounter for gynecological examination (general) (routine) without abnormal findings: Secondary | ICD-10-CM | POA: Diagnosis not present

## 2016-04-14 DIAGNOSIS — Z6830 Body mass index (BMI) 30.0-30.9, adult: Secondary | ICD-10-CM | POA: Diagnosis not present

## 2016-04-21 DIAGNOSIS — L570 Actinic keratosis: Secondary | ICD-10-CM | POA: Diagnosis not present

## 2016-04-29 ENCOUNTER — Other Ambulatory Visit: Payer: Self-pay | Admitting: Gastroenterology

## 2016-05-10 ENCOUNTER — Other Ambulatory Visit: Payer: Self-pay | Admitting: Gastroenterology

## 2016-06-22 DIAGNOSIS — Z961 Presence of intraocular lens: Secondary | ICD-10-CM | POA: Diagnosis not present

## 2016-06-22 DIAGNOSIS — H43813 Vitreous degeneration, bilateral: Secondary | ICD-10-CM | POA: Diagnosis not present

## 2016-06-22 DIAGNOSIS — H35363 Drusen (degenerative) of macula, bilateral: Secondary | ICD-10-CM | POA: Diagnosis not present

## 2016-07-01 ENCOUNTER — Encounter (HOSPITAL_COMMUNITY): Payer: Self-pay

## 2016-07-05 ENCOUNTER — Encounter (HOSPITAL_COMMUNITY)
Admission: RE | Disposition: A | Discharge status: Home / Self Care | Payer: Self-pay | Source: Ambulatory Visit | Attending: Gastroenterology

## 2016-07-05 ENCOUNTER — Ambulatory Visit (HOSPITAL_COMMUNITY): Payer: Medicare Other | Admitting: Registered Nurse

## 2016-07-05 ENCOUNTER — Ambulatory Visit (HOSPITAL_COMMUNITY)
Admission: RE | Admit: 2016-07-05 | Discharge: 2016-07-05 | Disposition: A | Discharge status: Home / Self Care | Payer: Medicare Other | Source: Ambulatory Visit | Attending: Gastroenterology | Admitting: Gastroenterology

## 2016-07-05 ENCOUNTER — Encounter (HOSPITAL_COMMUNITY): Payer: Self-pay

## 2016-07-05 DIAGNOSIS — M1990 Unspecified osteoarthritis, unspecified site: Secondary | ICD-10-CM | POA: Insufficient documentation

## 2016-07-05 DIAGNOSIS — Z1211 Encounter for screening for malignant neoplasm of colon: Secondary | ICD-10-CM | POA: Insufficient documentation

## 2016-07-05 DIAGNOSIS — R51 Headache: Secondary | ICD-10-CM | POA: Insufficient documentation

## 2016-07-05 DIAGNOSIS — I10 Essential (primary) hypertension: Secondary | ICD-10-CM | POA: Insufficient documentation

## 2016-07-05 DIAGNOSIS — K449 Diaphragmatic hernia without obstruction or gangrene: Secondary | ICD-10-CM | POA: Diagnosis not present

## 2016-07-05 DIAGNOSIS — E7800 Pure hypercholesterolemia, unspecified: Secondary | ICD-10-CM | POA: Diagnosis not present

## 2016-07-05 DIAGNOSIS — K219 Gastro-esophageal reflux disease without esophagitis: Secondary | ICD-10-CM | POA: Insufficient documentation

## 2016-07-05 DIAGNOSIS — Z79899 Other long term (current) drug therapy: Secondary | ICD-10-CM | POA: Insufficient documentation

## 2016-07-05 DIAGNOSIS — F419 Anxiety disorder, unspecified: Secondary | ICD-10-CM | POA: Insufficient documentation

## 2016-07-05 HISTORY — PX: COLONOSCOPY WITH PROPOFOL: SHX5780

## 2016-07-05 SURGERY — COLONOSCOPY WITH PROPOFOL
Anesthesia: Monitor Anesthesia Care

## 2016-07-05 MED ORDER — PROPOFOL 10 MG/ML IV BOLUS
INTRAVENOUS | Status: DC | PRN
  Administered 2016-07-05 (×2): 20 mg via INTRAVENOUS
  Administered 2016-07-05: 30 mg via INTRAVENOUS

## 2016-07-05 MED ORDER — LACTATED RINGERS IV SOLN
INTRAVENOUS | Status: DC
  Administered 2016-07-05: 13:00:00 via INTRAVENOUS

## 2016-07-05 MED ORDER — PROPOFOL 10 MG/ML IV BOLUS
INTRAVENOUS | Status: AC
  Filled 2016-07-05: qty 40

## 2016-07-05 MED ORDER — LIDOCAINE 2% (20 MG/ML) 5 ML SYRINGE
INTRAMUSCULAR | Status: DC | PRN
  Administered 2016-07-05: 100 mg via INTRAVENOUS

## 2016-07-05 MED ORDER — PROPOFOL 500 MG/50ML IV EMUL
INTRAVENOUS | Status: DC | PRN
  Administered 2016-07-05: 140 ug/kg/min via INTRAVENOUS

## 2016-07-05 MED ORDER — PROPOFOL 10 MG/ML IV BOLUS
INTRAVENOUS | Status: AC
  Filled 2016-07-05: qty 20

## 2016-07-05 MED ORDER — LIDOCAINE 2% (20 MG/ML) 5 ML SYRINGE
INTRAMUSCULAR | Status: AC
  Filled 2016-07-05: qty 5

## 2016-07-05 SURGICAL SUPPLY — 21 items

## 2016-07-05 NOTE — H&P (Signed)
Procedure: Screening colonoscopy. Positive cologuard stool colon cancer screening test  History: The patient is a 71 year old female born [birth year]. She is scheduled to undergo a screening colonoscopy today.  Past medical history: Hypertension. Lumbar radiculopathy. Osteoarthritis of the hips. Bilateral hip replacement surgeries. Hypercholesterolemia. Cholecystectomy. Hysterectomy. Foot surgery. Rotator cuff repair. Tubal ligation. Jaw surgery. Cataract surgery.  Medication allergies: ACE inhibitors cause cough. Codeine. Erythromycin. IVP dye cause hives.  Exam: The patient is alert and lying comfortably on the endoscopy stretcher. Abdomen is soft and nontender to palpation. Lungs are clear to auscultation. Cardiac exam reveals a regular rhythm.  Plan: Proceed with screening colonoscopy

## 2016-07-05 NOTE — Anesthesia Preprocedure Evaluation (Addendum)
Anesthesia Evaluation  Patient identified by MRN, date of birth, ID band Patient awake    Reviewed: Allergy & Precautions, NPO status , Patient's Chart, lab work & pertinent test results, reviewed documented beta blocker date and time   Airway Mallampati: II  TM Distance: >3 FB Neck ROM: Full    Dental  (+) Teeth Intact, Dental Advisory Given   Pulmonary neg pulmonary ROS,    breath sounds clear to auscultation       Cardiovascular hypertension, Pt. on medications and Pt. on home beta blockers  Rhythm:Regular Rate:Normal     Neuro/Psych  Headaches, PSYCHIATRIC DISORDERS Anxiety    GI/Hepatic Neg liver ROS, hiatal hernia, GERD  ,  Endo/Other  negative endocrine ROS  Renal/GU negative Renal ROS  negative genitourinary   Musculoskeletal  (+) Arthritis , Osteoarthritis,    Abdominal   Peds negative pediatric ROS (+)  Hematology negative hematology ROS (+)   Anesthesia Other Findings   Reproductive/Obstetrics negative OB ROS                            Anesthesia Physical Anesthesia Plan  ASA: II  Anesthesia Plan: MAC   Post-op Pain Management:    Induction: Intravenous  Airway Management Planned: Natural Airway  Additional Equipment:   Intra-op Plan:   Post-operative Plan:   Informed Consent: I have reviewed the patients History and Physical, chart, labs and discussed the procedure including the risks, benefits and alternatives for the proposed anesthesia with the patient or authorized representative who has indicated his/her understanding and acceptance.     Plan Discussed with: CRNA  Anesthesia Plan Comments:         Anesthesia Quick Evaluation

## 2016-07-05 NOTE — Transfer of Care (Signed)
Immediate Anesthesia Transfer of Care Note  Patient: Sierra [name]  Procedure(s) Performed: Procedure(s): COLONOSCOPY WITH PROPOFOL (N/A)  Patient Location: PACU and Endoscopy Unit  Anesthesia Type:MAC  Level of Consciousness: awake, alert , oriented and patient cooperative  Airway & Oxygen Therapy: Patient Spontanous Breathing and Patient connected to face mask oxygen  Post-op Assessment: Report given to RN, Post -op Vital signs reviewed and stable and Patient moving all extremities  Post vital signs: Reviewed and stable  Last Vitals:  Vitals:   07/05/16 1230  BP: (!) 145/49  Pulse: 68  Resp: 16  Temp: 36.8 C    Last Pain:  Vitals:   07/05/16 1230  TempSrc: Oral         Complications: No apparent anesthesia complications

## 2016-07-05 NOTE — Discharge Instructions (Signed)
YOU HAD AN ENDOSCOPIC PROCEDURE TODAY: Refer to the procedure report and other information in the discharge instructions given to you for any specific questions about what was found during the examination. If this information does not answer your questions, please call Dr. Johnson office at [phone] to clarify.  ° °YOU SHOULD EXPECT: Some feelings of bloating in the abdomen. Passage of more gas than usual. Walking can help get rid of the air that was put into your GI tract during the procedure and reduce the bloating. If you had a lower endoscopy (such as a colonoscopy or flexible sigmoidoscopy) you may notice spotting of blood in your stool or on the toilet paper. Some abdominal soreness may be present for a day or two, also. ° °DIET: Your first meal following the procedure should be a light meal and then it is ok to progress to your normal diet. A half-sandwich or bowl of soup is an example of a good first meal. Heavy or fried foods are harder to digest and may make you feel nauseous or bloated. Drink plenty of fluids but you should avoid alcoholic beverages for 24 hours. If you had a esophageal dilation, please see attached instructions for diet.  ° °ACTIVITY: Your care partner should take you home directly after the procedure. You should plan to take it easy, moving slowly for the rest of the day. You can resume normal activity the day after the procedure however YOU SHOULD NOT DRIVE, use power tools, machinery or perform tasks that involve climbing or major physical exertion for 24 hours (because of the sedation medicines used during the test).  ° °SYMPTOMS TO REPORT IMMEDIATELY: °A gastroenterologist can be reached at any hour. Please call [phone] for any of the following symptoms:  °Following lower endoscopy (colonoscopy, flexible sigmoidoscopy) °Excessive amounts of blood in the stool  °Significant tenderness, worsening of abdominal pains  °Swelling of the abdomen that is new, acute  °Fever of 100°  or higher  ° ° °FOLLOW UP:  °If any biopsies were taken you will be contacted by phone or by letter within the next 1-3 weeks. Call [phone]  if you have not heard about the biopsies in 3 weeks.  °Please also call with any specific questions about appointments or follow up tests. ° ° °

## 2016-07-05 NOTE — Anesthesia Postprocedure Evaluation (Signed)
Anesthesia Post Note  Patient: Sierra [name]  Procedure(s) Performed: Procedure(s) (LRB): COLONOSCOPY WITH PROPOFOL (N/A)  Patient location during evaluation: Endoscopy Anesthesia Type: MAC Level of consciousness: awake and alert Pain management: pain level controlled Vital Signs Assessment: post-procedure vital signs reviewed and stable Respiratory status: spontaneous breathing, nonlabored ventilation, respiratory function stable and patient connected to nasal cannula oxygen Cardiovascular status: stable and blood pressure returned to baseline Anesthetic complications: no       Last Vitals:  Vitals:   07/05/16 1420 07/05/16 1430  BP: 128/80 (!) 156/52  Pulse: 79 71  Resp: 19 14  Temp:      Last Pain:  Vitals:   07/05/16 1410  TempSrc: Oral                 Effie Berkshire

## 2016-07-05 NOTE — Op Note (Signed)
[facility] Patient Name: Sierra [name] Procedure Date: 07/05/2016 MRN: [id number] Attending MD: Garlan Fair , MD Date of Birth: 07-07-1945 CSN: [id number] Age: 71 Admit Type: Outpatient Procedure:                Colonoscopy Indications:              Screening for colorectal malignant neoplasm Providers:                Garlan Fair, MD, Laverta Baltimore RN, RN, Alfonso Patten, Technician, Courtney Heys. Armistead, CRNA Referring MD:              Medicines:                Propofol per Anesthesia Complications:            No immediate complications. Estimated Blood Loss:     Estimated blood loss: none. Procedure:                Pre-Anesthesia Assessment:                           - Prior to the procedure, a History and Physical                            was performed, and patient medications and                            allergies were reviewed. The patient's tolerance of                            previous anesthesia was also reviewed. The risks                            and benefits of the procedure and the sedation                            options and risks were discussed with the patient.                            All questions were answered, and informed consent                            was obtained. Prior Anticoagulants: The patient has                            taken no previous anticoagulant or antiplatelet                            agents. ASA Grade Assessment: II - A patient with                            mild systemic disease. After reviewing the risks  and benefits, the patient was deemed in                            satisfactory condition to undergo the procedure.                           After obtaining informed consent, the colonoscope                            was passed under direct vision. Throughout the                            procedure, the patient's blood pressure, pulse, and                        oxygen saturations were monitored continuously. The                            [id number]) scope was introduced through                            the anus and advanced to the the cecum, identified                            by appendiceal orifice and ileocecal valve. The                            colonoscopy was technically difficult and complex                            due to significant looping. The patient tolerated                            the procedure well. The quality of the bowel                            preparation was good. The appendiceal orifice and                            the rectum were photographed. Scope In: 1:29:22 PM Scope Out: 2:00:58 PM Scope Withdrawal Time: 0 hours 10 minutes 7 seconds  Total Procedure Duration: 0 hours 31 minutes 36 seconds  Findings:      The perianal and digital rectal examinations were normal.      The entire examined colon appeared normal. Impression:               - The entire examined colon is normal.                           - No specimens collected. Moderate Sedation:      N/A- Per Anesthesia Care Recommendation:           - Patient has a contact number available for  emergencies. The signs and symptoms of potential                            delayed complications were discussed with the                            patient. Return to normal activities tomorrow.                            Written discharge instructions were provided to the                            patient.                           - Repeat colonoscopy is not recommended for                            screening purposes.                           - Resume previous diet.                           - Continue present medications. Procedure Code(s):        --- Professional ---                           [id number], Colorectal cancer screening; colonoscopy on                            individual not meeting  criteria for high risk Diagnosis Code(s):        --- Professional ---                           Z12.11, Encounter for screening for malignant                            neoplasm of colon CPT copyright 2016 [employer]. All rights reserved. The codes documented in this report are preliminary and upon coder review may  be revised to meet current compliance requirements. Earle Gell, MD Garlan Fair, MD 07/05/2016 2:05:26 PM This report has been signed electronically. Number of Addenda: 0

## 2016-07-07 ENCOUNTER — Encounter (HOSPITAL_COMMUNITY): Payer: Self-pay | Admitting: Gastroenterology

## 2016-09-20 DIAGNOSIS — Z1231 Encounter for screening mammogram for malignant neoplasm of breast: Secondary | ICD-10-CM | POA: Diagnosis not present

## 2016-09-28 DIAGNOSIS — I10 Essential (primary) hypertension: Secondary | ICD-10-CM | POA: Diagnosis not present

## 2016-09-28 DIAGNOSIS — K219 Gastro-esophageal reflux disease without esophagitis: Secondary | ICD-10-CM | POA: Diagnosis not present

## 2016-10-21 IMAGING — CT CT ABD-PELV W/O CM
2 of 4 series · 16 of 46 positions shown, 18 images · non-contrast
Comparison: CT 10/22/2009

CLINICAL DATA: Nausea and vomiting with diarrhea for 2 days.

EXAM:
CT ABDOMEN AND PELVIS WITHOUT CONTRAST
TECHNIQUE: Multidetector CT imaging of the abdomen and pelvis was performed
following the standard protocol without IV contrast.

[Series 2: abd/pel w/o · axial · non-contrast · 0.85mm/px · z∈[-528,-98]mm · 13 of 94 slices shown, 15 images]
[im 4/94  soft-tissue]
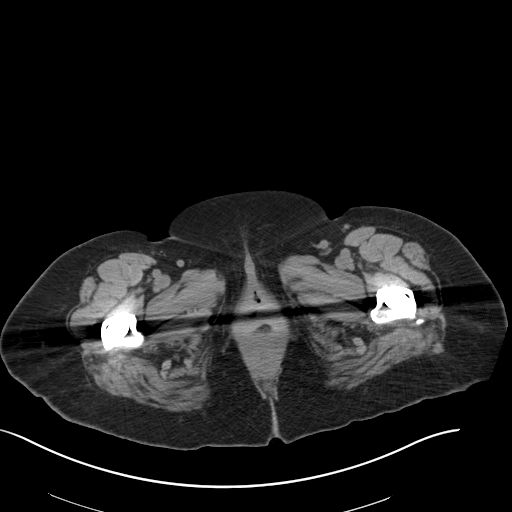
[im 4/94  bone]
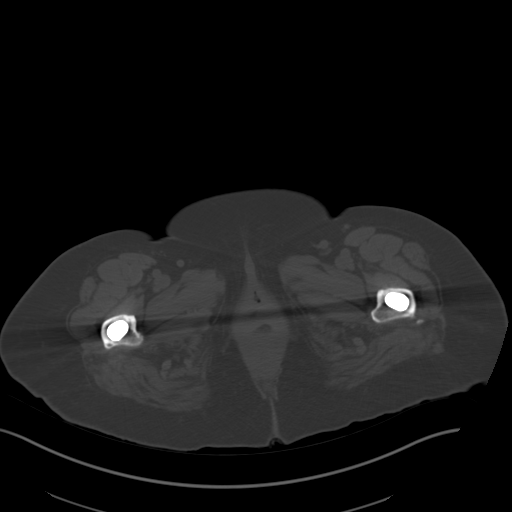
[im 12/94  soft-tissue]
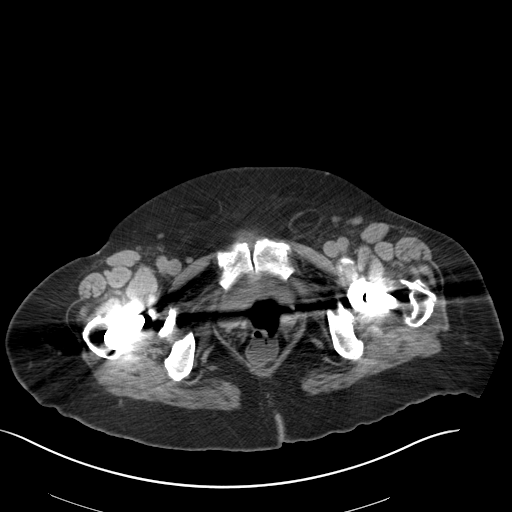
[im 20/94  soft-tissue]
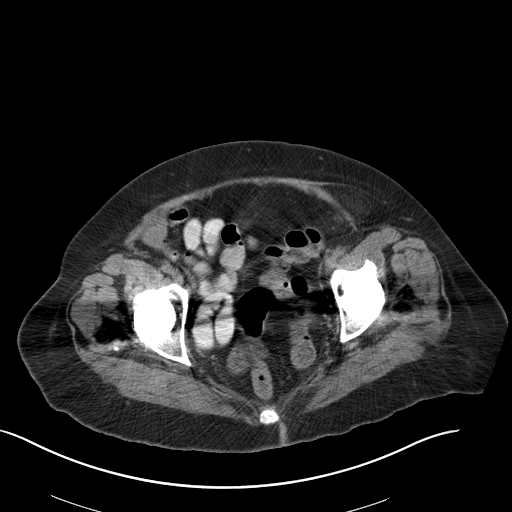
[im 28/94  soft-tissue]
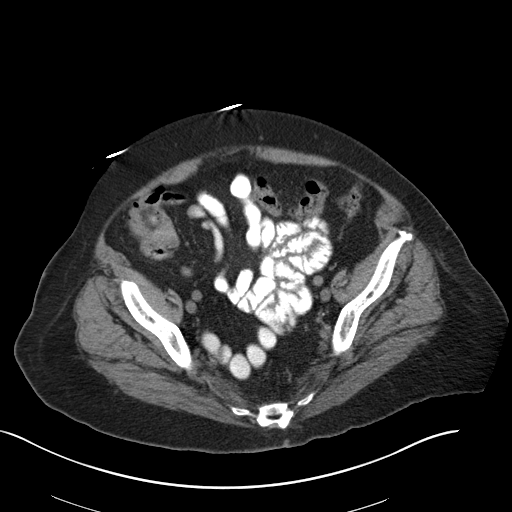
[im 32/94  soft-tissue]
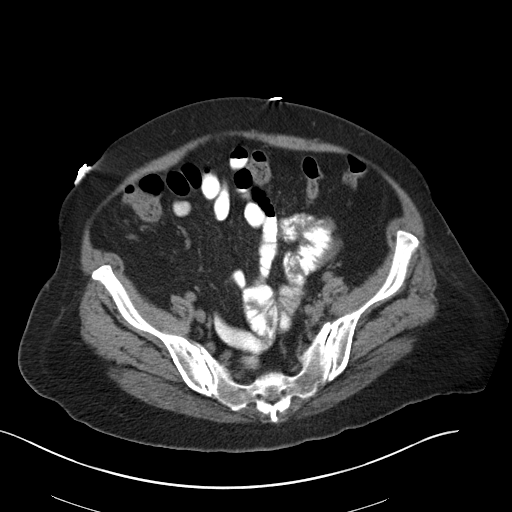
[im 39/94  soft-tissue]
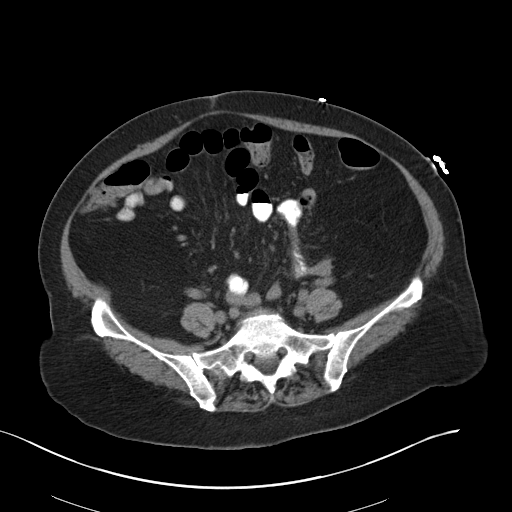
[im 47/94  soft-tissue]
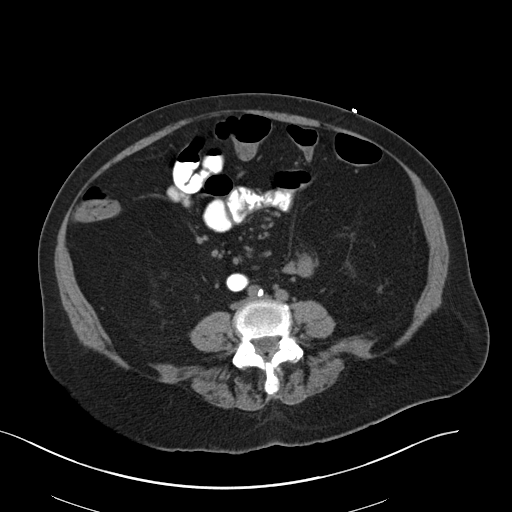
[im 55/94  soft-tissue]
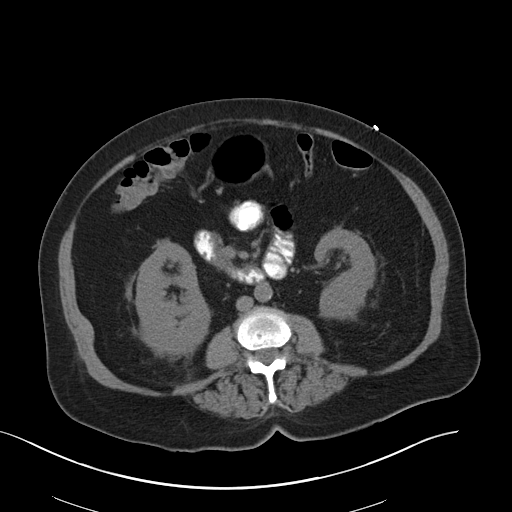
[im 63/94  soft-tissue]
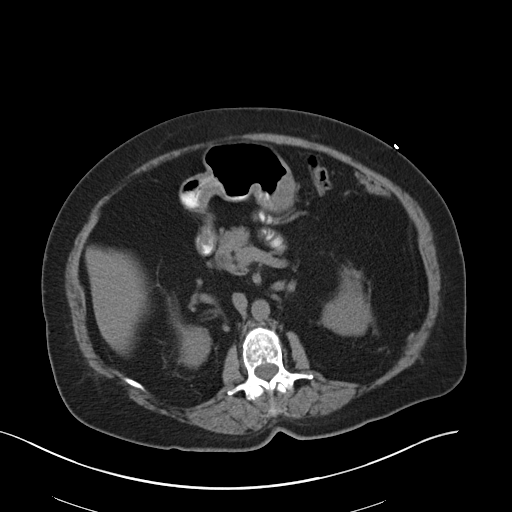
[im 63/94  bone]
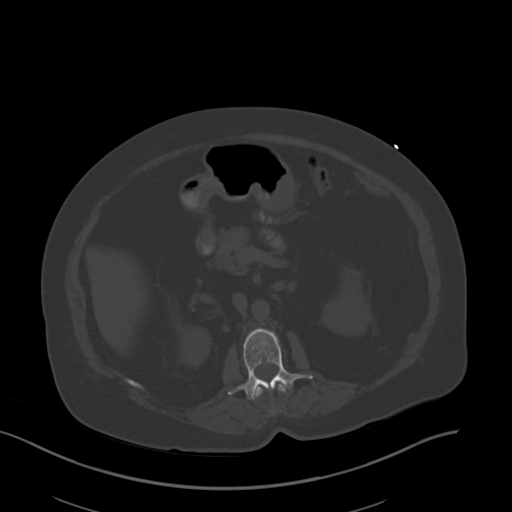
[im 66/94  soft-tissue]
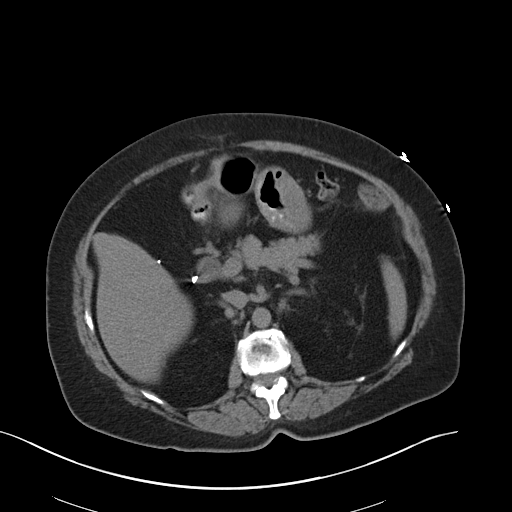
[im 74/94  soft-tissue]
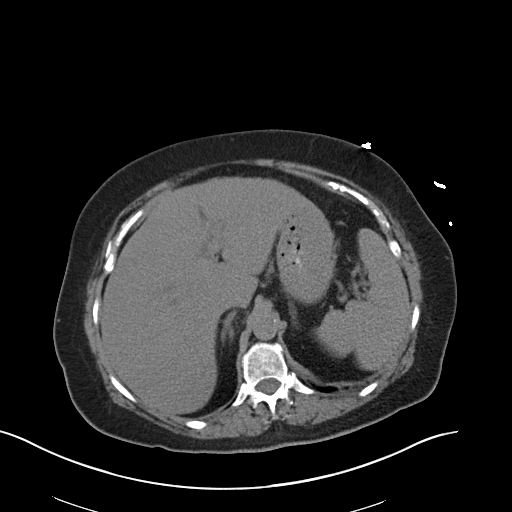
[im 82/94  soft-tissue]
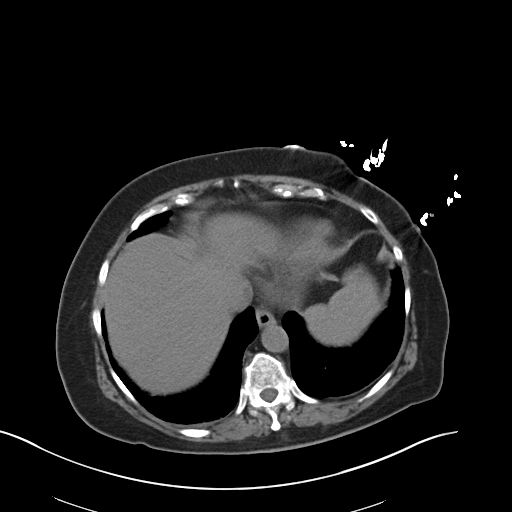
[im 90/94  soft-tissue]
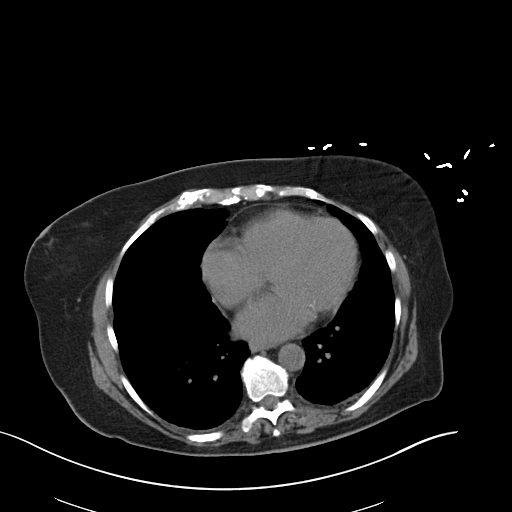

[Series 5: coronal · coronal · 0.74mm/px · 3 of 98 slices shown]
[im 33/98  soft-tissue]
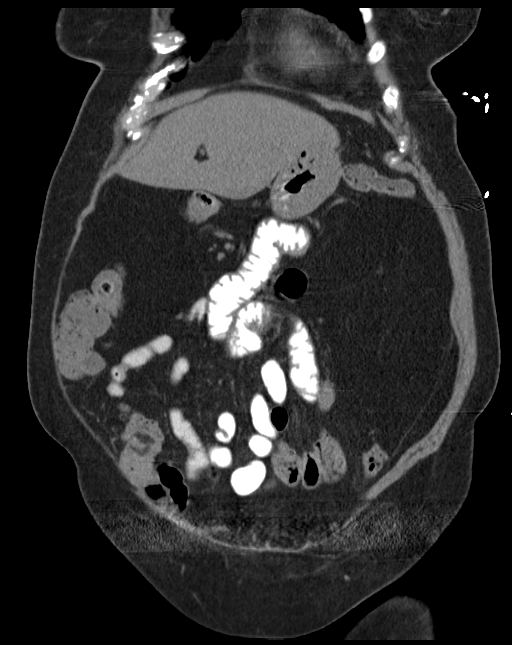
[im 44/98  soft-tissue]
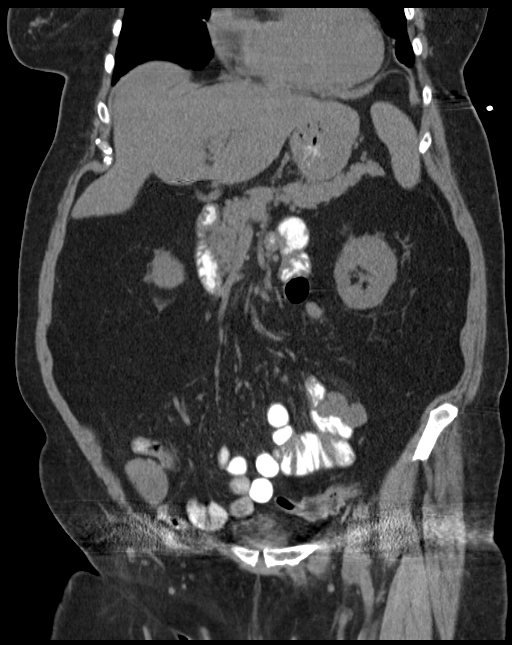
[im 54/98  soft-tissue]
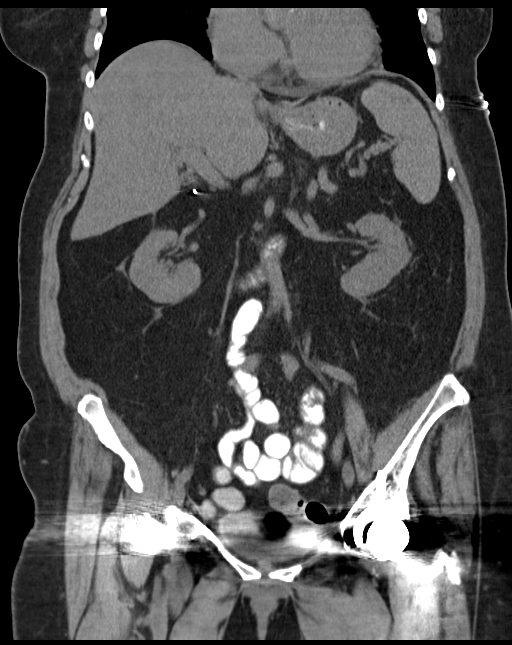

[16 of 46 positions shown; findings below may reference images not displayed]

FINDINGS: Lower chest: The included lung bases are clear. Heart at the upper
limits of normal in size.

Liver: No focal lesion allowing for lack contrast.

Hepatobiliary: Gallbladder surgically absent with clips in the
gallbladder fossa. Biliary prominence, likely sequela of
cholecystectomy.

Pancreas: No ductal dilatation or surrounding inflammation.

Spleen: Normal.

Adrenal glands: No nodule.  Minimal thickening on the left.

Kidneys: Bilateral nonspecific perinephric stranding. No
hydronephrosis. No urolithiasis. Ureters are decompressed.

Stomach/Bowel: Stomach physiologically distended. There are no
dilated or thickened small bowel loops. Small volume of stool
throughout the colon without colonic wall thickening. Tortuosity of
the splenic flexure of the colon. The appendix is normal.

Vascular/Lymphatic: No retroperitoneal adenopathy. Abdominal aorta
is normal in caliber.

Reproductive: Uterus appears surgically absent, streak artifact
through pelvis from bilateral hip arthroplasties partially obscures
evaluation.

Bladder: Minimally distended not well evaluated.

Other: No free air, free fluid, or intra-abdominal fluid collection.
Fat within both inguinal canals. Tiny fat containing umbilical
hernia.

Musculoskeletal: There are no acute or suspicious osseous
abnormalities. Bilateral hip arthroplasties.
IMPRESSION: No acute abnormality in the abdomen/pelvis.

## 2016-11-19 NOTE — Addendum Note (Signed)
Addendum  created 11/19/16 0902 by Effie Berkshire, MD   Sign clinical note

## 2016-11-19 NOTE — Anesthesia Postprocedure Evaluation (Signed)
Anesthesia Post Note  Patient: Sierra [name]  Procedure(s) Performed: Procedure(s) (LRB): COLONOSCOPY WITH PROPOFOL (N/A)     Anesthesia Post Evaluation  Last Vitals:  Vitals:   07/05/16 1420 07/05/16 1430  BP: 128/80 (!) 156/52  Pulse: 79 71  Resp: 19 14  Temp:      Last Pain:  Vitals:   07/05/16 1410  TempSrc: Oral                 Effie Berkshire

## 2016-12-20 DIAGNOSIS — S2002XA Contusion of left breast, initial encounter: Secondary | ICD-10-CM | POA: Diagnosis not present

## 2016-12-28 DIAGNOSIS — N6322 Unspecified lump in the left breast, upper inner quadrant: Secondary | ICD-10-CM | POA: Diagnosis not present

## 2017-01-17 DIAGNOSIS — S2002XD Contusion of left breast, subsequent encounter: Secondary | ICD-10-CM | POA: Diagnosis not present

## 2017-02-07 DIAGNOSIS — N760 Acute vaginitis: Secondary | ICD-10-CM | POA: Diagnosis not present

## 2017-02-07 DIAGNOSIS — N390 Urinary tract infection, site not specified: Secondary | ICD-10-CM | POA: Diagnosis not present

## 2017-02-07 DIAGNOSIS — R8299 Other abnormal findings in urine: Secondary | ICD-10-CM | POA: Diagnosis not present

## 2017-02-17 DIAGNOSIS — R319 Hematuria, unspecified: Secondary | ICD-10-CM | POA: Diagnosis not present

## 2017-02-17 DIAGNOSIS — N390 Urinary tract infection, site not specified: Secondary | ICD-10-CM | POA: Diagnosis not present

## 2017-03-11 DIAGNOSIS — R310 Gross hematuria: Secondary | ICD-10-CM | POA: Diagnosis not present

## 2017-03-21 DIAGNOSIS — R109 Unspecified abdominal pain: Secondary | ICD-10-CM | POA: Diagnosis not present

## 2017-03-21 DIAGNOSIS — R310 Gross hematuria: Secondary | ICD-10-CM | POA: Diagnosis not present

## 2017-03-23 DIAGNOSIS — I481 Persistent atrial fibrillation: Secondary | ICD-10-CM | POA: Diagnosis not present

## 2017-03-23 DIAGNOSIS — I10 Essential (primary) hypertension: Secondary | ICD-10-CM | POA: Diagnosis not present

## 2017-03-23 DIAGNOSIS — M179 Osteoarthritis of knee, unspecified: Secondary | ICD-10-CM | POA: Diagnosis not present

## 2017-03-23 DIAGNOSIS — Z23 Encounter for immunization: Secondary | ICD-10-CM | POA: Diagnosis not present

## 2017-03-23 DIAGNOSIS — I509 Heart failure, unspecified: Secondary | ICD-10-CM | POA: Diagnosis not present

## 2017-03-23 DIAGNOSIS — M169 Osteoarthritis of hip, unspecified: Secondary | ICD-10-CM | POA: Diagnosis not present

## 2017-03-29 DIAGNOSIS — N202 Calculus of kidney with calculus of ureter: Secondary | ICD-10-CM | POA: Diagnosis not present

## 2017-03-29 DIAGNOSIS — R310 Gross hematuria: Secondary | ICD-10-CM | POA: Diagnosis not present

## 2017-04-04 DIAGNOSIS — N201 Calculus of ureter: Secondary | ICD-10-CM | POA: Diagnosis not present

## 2017-04-06 ENCOUNTER — Inpatient Hospital Stay (HOSPITAL_COMMUNITY)
Admission: EM | Admit: 2017-04-06 | Discharge: 2017-04-11 | DRG: 872 | Disposition: A | Discharge status: Home / Self Care | Payer: Medicare Other | Attending: Family Medicine | Admitting: Family Medicine

## 2017-04-06 ENCOUNTER — Encounter (HOSPITAL_COMMUNITY): Payer: Self-pay | Admitting: Emergency Medicine

## 2017-04-06 DIAGNOSIS — A419 Sepsis, unspecified organism: Secondary | ICD-10-CM | POA: Diagnosis not present

## 2017-04-06 DIAGNOSIS — N390 Urinary tract infection, site not specified: Secondary | ICD-10-CM | POA: Diagnosis present

## 2017-04-06 DIAGNOSIS — Z888 Allergy status to other drugs, medicaments and biological substances status: Secondary | ICD-10-CM

## 2017-04-06 DIAGNOSIS — R0989 Other specified symptoms and signs involving the circulatory and respiratory systems: Secondary | ICD-10-CM

## 2017-04-06 DIAGNOSIS — E785 Hyperlipidemia, unspecified: Secondary | ICD-10-CM | POA: Diagnosis present

## 2017-04-06 DIAGNOSIS — K5900 Constipation, unspecified: Secondary | ICD-10-CM | POA: Diagnosis present

## 2017-04-06 DIAGNOSIS — I480 Paroxysmal atrial fibrillation: Secondary | ICD-10-CM | POA: Diagnosis present

## 2017-04-06 DIAGNOSIS — F32A Depression, unspecified: Secondary | ICD-10-CM | POA: Diagnosis present

## 2017-04-06 DIAGNOSIS — M1990 Unspecified osteoarthritis, unspecified site: Secondary | ICD-10-CM | POA: Diagnosis present

## 2017-04-06 DIAGNOSIS — K219 Gastro-esophageal reflux disease without esophagitis: Secondary | ICD-10-CM | POA: Diagnosis present

## 2017-04-06 DIAGNOSIS — R0902 Hypoxemia: Secondary | ICD-10-CM | POA: Diagnosis not present

## 2017-04-06 DIAGNOSIS — Z885 Allergy status to narcotic agent status: Secondary | ICD-10-CM

## 2017-04-06 DIAGNOSIS — I10 Essential (primary) hypertension: Secondary | ICD-10-CM | POA: Diagnosis present

## 2017-04-06 DIAGNOSIS — N201 Calculus of ureter: Secondary | ICD-10-CM | POA: Diagnosis not present

## 2017-04-06 DIAGNOSIS — B961 Klebsiella pneumoniae [K. pneumoniae] as the cause of diseases classified elsewhere: Secondary | ICD-10-CM | POA: Diagnosis present

## 2017-04-06 DIAGNOSIS — M5489 Other dorsalgia: Secondary | ICD-10-CM | POA: Diagnosis not present

## 2017-04-06 DIAGNOSIS — R52 Pain, unspecified: Secondary | ICD-10-CM | POA: Diagnosis not present

## 2017-04-06 DIAGNOSIS — Z96612 Presence of left artificial shoulder joint: Secondary | ICD-10-CM | POA: Diagnosis present

## 2017-04-06 DIAGNOSIS — A4159 Other Gram-negative sepsis: Principal | ICD-10-CM | POA: Diagnosis present

## 2017-04-06 DIAGNOSIS — Z96611 Presence of right artificial shoulder joint: Secondary | ICD-10-CM | POA: Diagnosis present

## 2017-04-06 DIAGNOSIS — F329 Major depressive disorder, single episode, unspecified: Secondary | ICD-10-CM | POA: Diagnosis not present

## 2017-04-06 DIAGNOSIS — I4891 Unspecified atrial fibrillation: Secondary | ICD-10-CM | POA: Diagnosis present

## 2017-04-06 DIAGNOSIS — R7881 Bacteremia: Secondary | ICD-10-CM | POA: Diagnosis present

## 2017-04-06 HISTORY — DX: Unspecified atrial fibrillation: I48.91

## 2017-04-06 LAB — BASIC METABOLIC PANEL
ANION GAP: 13 (ref 5–15)
BUN: 33 mg/dL — ABNORMAL HIGH (ref 6–20)
CALCIUM: 9.3 mg/dL (ref 8.9–10.3)
CO2: 24 mmol/L (ref 22–32)
Chloride: 101 mmol/L (ref 101–111)
Creatinine, Ser: 0.94 mg/dL (ref 0.44–1.00)
GFR, EST NON AFRICAN AMERICAN: 59 mL/min — AB (ref >60)
Glucose, Bld: 139 mg/dL — ABNORMAL HIGH (ref 65–99)
Potassium: 3.6 mmol/L (ref 3.5–5.1)
Sodium: 138 mmol/L (ref 135–145)

## 2017-04-06 LAB — I-STAT CG4 LACTIC ACID, ED: LACTIC ACID, VENOUS: 4.36 mmol/L — AB (ref 0.5–1.9)

## 2017-04-06 LAB — CBC
HCT: 37.2 % (ref 36.0–46.0)
HEMOGLOBIN: 12.8 g/dL (ref 12.0–15.0)
MCH: 31.2 pg (ref 26.0–34.0)
MCHC: 34.4 g/dL (ref 30.0–36.0)
MCV: 90.7 fL (ref 78.0–100.0)
Platelets: 172 10*3/uL (ref 150–400)
RBC: 4.1 MIL/uL (ref 3.87–5.11)
RDW: 13.3 % (ref 11.5–15.5)
WBC: 3.1 10*3/uL — ABNORMAL LOW (ref 4.0–10.5)

## 2017-04-06 MED ORDER — DEXTROSE 5 % IV SOLN
2.0000 g | Freq: Once | INTRAVENOUS | Status: AC
  Administered 2017-04-07: 2 g via INTRAVENOUS
  Filled 2017-04-06 (×2): qty 2

## 2017-04-06 MED ORDER — FENTANYL CITRATE (PF) 100 MCG/2ML IJ SOLN
50.0000 ug | INTRAMUSCULAR | Status: DC | PRN
  Administered 2017-04-06: 50 ug via INTRAVENOUS
  Filled 2017-04-06: qty 2

## 2017-04-06 MED ORDER — SODIUM CHLORIDE 0.9 % IV BOLUS (SEPSIS)
30.0000 mL/kg | Freq: Once | INTRAVENOUS | Status: AC
  Administered 2017-04-07: 2598 mL via INTRAVENOUS

## 2017-04-06 NOTE — ED Provider Notes (Addendum)
[facility] DEPT Provider Note: Georgena Spurling, MD, FACEP  CSN: [id number] MRN: [id number] ARRIVAL: 04/06/17 at 2212 ROOM: WA10/WA10   CHIEF COMPLAINT  Back Pain and Chills   HISTORY OF PRESENT ILLNESS  04/06/17 11:33 PM Sierra [name] is a 71 y.o. female who underwent a cystoscopy 2 days ago for evaluation of her ureters. It is thought she had a calcification in the left ureter due to suspected ureteral deformity the procedure was not completed. Plans are made to approach the ureter from the left kidney but this has not yet been done. She had been doing well until this morning when she developed pain in her left flank and left lower quadrant. This worsened through the dayand was severe at times. They were associated chills and she was noted to have a rectal temperature of 103.9 on arrival with associated tachycardia. She was given Zofran and fentanyl IV by EMS prior to arrival with significant improvement in her back pain. She continues to have generalized body aches, notably in her extremities. She was noted to be hypoxic after administration of fentanyl and this improved with oxygen. Urine has been green due to taking methylene blue.   Past Medical History:  Diagnosis Date  . Anxiety   . Arthritis    "joints" (09/19/2013)  . Basal cell carcinoma of cheek    left  . Chronic lower back pain   . GERD (gastroesophageal reflux disease)   . H/O hiatal hernia   . Hypertension   . Migraine    "haven't had any in a long time" (09/19/2013)  . Pneumonia ~ 2012    Past Surgical History:  Procedure Laterality Date  . BASAL CELL CARCINOMA EXCISION Left    "cheek"  . CHOLECYSTECTOMY  1990's  . COLONOSCOPY WITH PROPOFOL N/A 07/05/2016   Procedure: COLONOSCOPY WITH PROPOFOL;  Surgeon: Garlan Fair, MD;  Location: [facility];  Service: Endoscopy;  Laterality: N/A;  . DILATION AND CURETTAGE OF UTERUS    . HERNIA REPAIR  8413   umbilical  . JOINT REPLACEMENT    . OOPHORECTOMY  Bilateral   . REVISION TOTAL HIP ARTHROPLASTY Left 09/19/2013  . SHOULDER ARTHROSCOPY W/ ROTATOR CUFF REPAIR Right   . TMJ ARTHROPLASTY    . TOTAL HIP ARTHROPLASTY Left 2009  . TOTAL HIP ARTHROPLASTY Right 2011  . TOTAL HIP REVISION Left 09/19/2013   Procedure: LEFT TOTAL HIP REVISION;  Surgeon: Kerin Salen, MD;  Location: [facility];  Service: Orthopedics;  Laterality: Left;  . TUBAL LIGATION    . VAGINAL HYSTERECTOMY  1990's    Family History  Problem Relation Age of Onset  . Heart attack Mother   . Heart attack Maternal Aunt   . Heart attack Paternal Uncle     Social History  Substance Use Topics  . Smoking status: Never Smoker  . Smokeless tobacco: Never Used  . Alcohol use Yes     Comment: 09/19/2013 "2-3 glasses of wine/month"    Prior to Admission medications   Medication Sig Start Date End Date Taking? Authorizing Provider  amLODipine (NORVASC) 10 MG tablet Take 10 mg by mouth daily.   Yes [provider]  Cholecalciferol (VITAMIN D-3) 5000 UNITS TABS Take 5,000 Units by mouth every Monday, Wednesday, and Friday.    Yes [provider]  citalopram (CELEXA) 40 MG tablet Take 40 mg by mouth daily.   Yes [provider]  COLESTID 5 g packet Take 5 g by mouth 2 (two) times daily.  01/06/17  Yes [provider]  CRANBERRY PO Take 1 tablet by mouth daily.   Yes [provider]  fexofenadine (ALLEGRA) 180 MG tablet Take 180 mg by mouth daily.   Yes [provider]  Glucomannan 1000 MG CAPS Take 1,000 mg by mouth daily.   Yes [provider]  Grape Seed 100 MG CAPS Take 100 mg by mouth daily.    Yes [provider]  hydrochlorothiazide (HYDRODIURIL) 25 MG tablet Take 25 mg by mouth daily.   Yes [provider]  losartan (COZAAR) 100 MG tablet Take 100 mg by mouth daily. 03/06/17  Yes [provider]  MAGNESIUM CITRATE PO Take 250 mg by mouth daily.   Yes [provider]  metoprolol  succinate (TOPROL-XL) 50 MG 24 hr tablet Take 50 mg by mouth daily. Take with or immediately following a meal.   Yes [provider]  Multiple Vitamins-Minerals (MULTIVITAMIN WITH MINERALS) tablet Take 1 tablet by mouth daily. 50+ for her   Yes [provider]  nabumetone (RELAFEN) 500 MG tablet Take 500 mg by mouth 2 (two) times daily. Will stop prior to procedure   Yes [provider]  Probiotic Product (ALIGN) 4 MG CAPS Take 1 capsule by mouth daily.   Yes [provider]  RABEprazole (ACIPHEX) 20 MG tablet Take 20 mg by mouth daily.    Yes [provider]  terazosin (HYTRIN) 10 MG capsule Take 10 mg by mouth at bedtime.   Yes [provider]  vitamin B-12 (CYANOCOBALAMIN) 500 MCG tablet Take 500 mcg by mouth every Monday, Wednesday, and Friday.    Yes [provider]  vitamin C (ASCORBIC ACID) 250 MG tablet Take 250 mg by mouth daily.   Yes [provider]    Allergies Ace inhibitors; Codeine; Erythromycin; and Iohexol   REVIEW OF SYSTEMS  Negative except as noted here or in the History of Present Illness.   PHYSICAL EXAMINATION  Initial Vital Signs Blood pressure (!) 215/80, pulse (!) 110, temperature (!) 103.9 F (39.9 C), temperature source Rectal, SpO2 90 %.  Examination General: Well-developed, well-nourished female in no acute distress; appearance consistent with age of record HENT: normocephalic; atraumatic Eyes: pupils equal, round and reactive to light; extraocular muscles intact; lens implants Neck: supple Heart: irregular rhythm Lungs: clear to auscultation bilaterally Abdomen: soft; nondistended; nontender; no masses or hepatosplenomegaly; bowel sounds present GU: No CVA tenderness Extremities: No deformity; full range of motion; pulses normal; trace edema of lower legs Neurologic: Awake, alert and oriented; motor function intact in all extremities and symmetric; no facial droop Skin: Warm and  dry Psychiatric: Normal mood and affect   RESULTS  Summary of this visit's results, reviewed by myself:   EKG Interpretation  Date/Time:  Thursday April 07 2017 04:17:34 EDT Ventricular Rate:  125 PR Interval:    QRS Duration: 91 QT Interval:  334 QTC Calculation: 482 R Axis:   76 Text Interpretation:  Atrial fibrillation Borderline repol abnormality, diffuse leads Rate is faster Confirmed by Pavneet Markwood, Jenny Reichmann [phone]) on 04/07/2017 4:26:52 AM      Laboratory Studies: Results for orders placed or performed during the hospital encounter of 04/06/17 (from the past 24 hour(s))  Urinalysis, Routine w reflex microscopic- may I&O cath if menses     Status: Abnormal   Collection Time: 04/06/17 10:18 PM  Result Value Ref Range   Color, Urine YELLOW YELLOW   APPearance CLEAR CLEAR   Specific Gravity, Urine 1.014 1.005 -  1.030   pH 5.0 5.0 - 8.0   Glucose, UA NEGATIVE NEGATIVE mg/dL   Hgb urine dipstick MODERATE (A) NEGATIVE   Bilirubin Urine NEGATIVE NEGATIVE   Ketones, ur NEGATIVE NEGATIVE mg/dL   Protein, ur NEGATIVE NEGATIVE mg/dL   Nitrite NEGATIVE NEGATIVE   Leukocytes, UA MODERATE (A) NEGATIVE   RBC / HPF 6-30 0 - 5 RBC/hpf   WBC, UA 6-30 0 - 5 WBC/hpf   Bacteria, UA RARE (A) NONE SEEN   Squamous Epithelial / LPF 0-5 (A) NONE SEEN   Mucus PRESENT   Basic metabolic panel     Status: Abnormal   Collection Time: 04/06/17 10:20 PM  Result Value Ref Range   Sodium 138 135 - 145 mmol/L   Potassium 3.6 3.5 - 5.1 mmol/L   Chloride 101 101 - 111 mmol/L   CO2 24 22 - 32 mmol/L   Glucose, Bld 139 (H) 65 - 99 mg/dL   BUN 33 (H) 6 - 20 mg/dL   Creatinine, Ser 0.94 0.44 - 1.00 mg/dL   Calcium 9.3 8.9 - 10.3 mg/dL   GFR calc non Af Amer 59 (L) >60 mL/min   GFR calc Af Amer >60 >60 mL/min   Anion gap 13 5 - 15  CBC     Status: Abnormal   Collection Time: 04/06/17 10:20 PM  Result Value Ref Range   WBC 3.1 (L) 4.0 - 10.5 K/uL   RBC 4.10 3.87 - 5.11 MIL/uL   Hemoglobin 12.8 12.0 -  15.0 g/dL   HCT 37.2 36.0 - 46.0 %   MCV 90.7 78.0 - 100.0 fL   MCH 31.2 26.0 - 34.0 pg   MCHC 34.4 30.0 - 36.0 g/dL   RDW 13.3 11.5 - 15.5 %   Platelets 172 150 - 400 K/uL  Differential     Status: Abnormal   Collection Time: 04/06/17 10:20 PM  Result Value Ref Range   Neutrophils Relative % 93 %   Neutro Abs 2.7 1.7 - 7.7 K/uL   Lymphocytes Relative 6 %   Lymphs Abs 0.2 (L) 0.7 - 4.0 K/uL   Monocytes Relative 0 %   Monocytes Absolute 0.0 (L) 0.1 - 1.0 K/uL   Eosinophils Relative 1 %   Eosinophils Absolute 0.0 0.0 - 0.7 K/uL   Basophils Relative 0 %   Basophils Absolute 0.0 0.0 - 0.1 K/uL  I-Stat CG4 Lactic Acid, ED     Status: Abnormal   Collection Time: 04/06/17 11:43 PM  Result Value Ref Range   Lactic Acid, Venous 4.36 (HH) 0.5 - 1.9 mmol/L   Comment NOTIFIED PHYSICIAN    Imaging Studies: US Renal  Result Date: 04/07/2017 CLINICAL DATA:  Evaluation for possible hydronephrosis. EXAM: RENAL / URINARY TRACT ULTRASOUND COMPLETE COMPARISON:  CT abdomen and pelvis 03/21/2017 FINDINGS: Right Kidney: Length: 11.3 cm. Echogenicity within normal limits. No mass or hydronephrosis visualized. Left Kidney: Length: 12 cm. Echogenicity within normal limits. No mass or hydronephrosis visualized. Bladder: No bladder wall thickening or filling defect. Urine flow jets are demonstrated bilaterally on color flow Doppler imaging. IMPRESSION: No evidence of hydronephrosis in either kidney. Electronically Signed   By: Lucienne Capers M.D.   On: 04/07/2017 02:44    ED COURSE  Nursing notes and initial vitals signs, including pulse oximetry, reviewed.  Vitals:   04/07/17 0127 04/07/17 0239 04/07/17 0300 04/07/17 0330  BP: (!) 148/57 (!) 146/59 (!) 144/53 (!) 143/58  Pulse: 96 93 88 91  Resp: 20 20 17  15  Temp: 98.8 F (37.1 C) (!) 100.6 F (38.1 C)    TempSrc: Oral Oral    SpO2: 97% 93% 92% 94%   11:52 PM Sepsis protocol initiated for elevated lactic acid. IV fluid bolus ordered.  Antibiotics will be ordered once urine specimen is obtained.   1:50 AM Patient's pain continues to be well controlled. Discussed with Dr. Alinda Money of urology. He will see the patient in the ED but requests a renal ultrasound to evaluate for hydronephrosis.  3:36 AM Dr. Blaine Hamper to admit to hospitalist service.  PROCEDURES    ED DIAGNOSES     ICD-10-CM   1. Complicated urinary tract infection N39.0   2. Arthritis M19.90        Shanon Rosser, MD 04/07/17 3202    Shanon Rosser, MD 04/07/17 [phone]

## 2017-04-06 NOTE — ED Notes (Signed)
Bed: WA04 Expected date:  Expected time:  Means of arrival:  Comments: EMS 71 yo female urine scope-now chills and pain

## 2017-04-06 NOTE — ED Triage Notes (Signed)
Patient arrives by EMS with complaints severe back pain and chills after having a bladder scope to look at her left ureter this past Monday. Patient complaining of severe back pain and chills-Temp 99.5. Patient took Vicodin at 62. EMS administered Zofran 4 mg ODT for complaints of nausea. BP 181/111, HR 117 No O2 sat obtained.

## 2017-04-07 ENCOUNTER — Encounter (HOSPITAL_COMMUNITY): Payer: Self-pay | Admitting: Internal Medicine

## 2017-04-07 ENCOUNTER — Emergency Department (HOSPITAL_COMMUNITY): Payer: Medicare Other

## 2017-04-07 ENCOUNTER — Inpatient Hospital Stay (HOSPITAL_COMMUNITY): Payer: Medicare Other

## 2017-04-07 DIAGNOSIS — E785 Hyperlipidemia, unspecified: Secondary | ICD-10-CM | POA: Diagnosis not present

## 2017-04-07 DIAGNOSIS — F329 Major depressive disorder, single episode, unspecified: Secondary | ICD-10-CM | POA: Diagnosis present

## 2017-04-07 DIAGNOSIS — R509 Fever, unspecified: Secondary | ICD-10-CM | POA: Diagnosis not present

## 2017-04-07 DIAGNOSIS — B961 Klebsiella pneumoniae [K. pneumoniae] as the cause of diseases classified elsewhere: Secondary | ICD-10-CM | POA: Diagnosis present

## 2017-04-07 DIAGNOSIS — R109 Unspecified abdominal pain: Secondary | ICD-10-CM | POA: Diagnosis not present

## 2017-04-07 DIAGNOSIS — F419 Anxiety disorder, unspecified: Secondary | ICD-10-CM | POA: Diagnosis not present

## 2017-04-07 DIAGNOSIS — I480 Paroxysmal atrial fibrillation: Secondary | ICD-10-CM | POA: Diagnosis not present

## 2017-04-07 DIAGNOSIS — K219 Gastro-esophageal reflux disease without esophagitis: Secondary | ICD-10-CM | POA: Diagnosis present

## 2017-04-07 DIAGNOSIS — M1990 Unspecified osteoarthritis, unspecified site: Secondary | ICD-10-CM | POA: Diagnosis present

## 2017-04-07 DIAGNOSIS — I361 Nonrheumatic tricuspid (valve) insufficiency: Secondary | ICD-10-CM | POA: Diagnosis not present

## 2017-04-07 DIAGNOSIS — A4151 Sepsis due to Escherichia coli [E. coli]: Secondary | ICD-10-CM | POA: Diagnosis not present

## 2017-04-07 DIAGNOSIS — F32A Depression, unspecified: Secondary | ICD-10-CM | POA: Diagnosis present

## 2017-04-07 DIAGNOSIS — N390 Urinary tract infection, site not specified: Secondary | ICD-10-CM | POA: Diagnosis present

## 2017-04-07 DIAGNOSIS — Z885 Allergy status to narcotic agent status: Secondary | ICD-10-CM | POA: Diagnosis not present

## 2017-04-07 DIAGNOSIS — N201 Calculus of ureter: Secondary | ICD-10-CM | POA: Diagnosis present

## 2017-04-07 DIAGNOSIS — Z96611 Presence of right artificial shoulder joint: Secondary | ICD-10-CM | POA: Diagnosis present

## 2017-04-07 DIAGNOSIS — R000 Tachycardia, unspecified: Secondary | ICD-10-CM | POA: Diagnosis not present

## 2017-04-07 DIAGNOSIS — I10 Essential (primary) hypertension: Secondary | ICD-10-CM

## 2017-04-07 DIAGNOSIS — Z888 Allergy status to other drugs, medicaments and biological substances status: Secondary | ICD-10-CM | POA: Diagnosis not present

## 2017-04-07 DIAGNOSIS — R0902 Hypoxemia: Secondary | ICD-10-CM | POA: Diagnosis not present

## 2017-04-07 DIAGNOSIS — A419 Sepsis, unspecified organism: Secondary | ICD-10-CM | POA: Diagnosis not present

## 2017-04-07 DIAGNOSIS — R7881 Bacteremia: Secondary | ICD-10-CM | POA: Diagnosis not present

## 2017-04-07 DIAGNOSIS — Z96612 Presence of left artificial shoulder joint: Secondary | ICD-10-CM | POA: Diagnosis present

## 2017-04-07 DIAGNOSIS — K5900 Constipation, unspecified: Secondary | ICD-10-CM | POA: Diagnosis present

## 2017-04-07 DIAGNOSIS — A4159 Other Gram-negative sepsis: Secondary | ICD-10-CM | POA: Diagnosis present

## 2017-04-07 LAB — BLOOD CULTURE ID PANEL (REFLEXED)
ACINETOBACTER BAUMANNII: NOT DETECTED
CANDIDA KRUSEI: NOT DETECTED
Candida albicans: NOT DETECTED
Candida glabrata: NOT DETECTED
Candida parapsilosis: NOT DETECTED
Candida tropicalis: NOT DETECTED
Carbapenem resistance: NOT DETECTED
ENTEROBACTERIACEAE SPECIES: DETECTED — AB
ENTEROCOCCUS SPECIES: NOT DETECTED
ESCHERICHIA COLI: NOT DETECTED
Enterobacter cloacae complex: NOT DETECTED
Haemophilus influenzae: NOT DETECTED
KLEBSIELLA OXYTOCA: NOT DETECTED
Klebsiella pneumoniae: DETECTED — AB
LISTERIA MONOCYTOGENES: NOT DETECTED
Neisseria meningitidis: NOT DETECTED
PSEUDOMONAS AERUGINOSA: NOT DETECTED
Proteus species: NOT DETECTED
STREPTOCOCCUS PNEUMONIAE: NOT DETECTED
STREPTOCOCCUS PYOGENES: NOT DETECTED
Serratia marcescens: NOT DETECTED
Staphylococcus aureus (BCID): NOT DETECTED
Staphylococcus species: NOT DETECTED
Streptococcus agalactiae: NOT DETECTED
Streptococcus species: NOT DETECTED

## 2017-04-07 LAB — LACTIC ACID, PLASMA
LACTIC ACID, VENOUS: 1.6 mmol/L (ref 0.5–1.9)
Lactic Acid, Venous: 2 mmol/L (ref 0.5–1.9)

## 2017-04-07 LAB — URINALYSIS, ROUTINE W REFLEX MICROSCOPIC
Bilirubin Urine: NEGATIVE
Glucose, UA: NEGATIVE mg/dL
Ketones, ur: NEGATIVE mg/dL
Nitrite: NEGATIVE
Protein, ur: NEGATIVE mg/dL
SPECIFIC GRAVITY, URINE: 1.014 (ref 1.005–1.030)
pH: 5 (ref 5.0–8.0)

## 2017-04-07 LAB — DIFFERENTIAL
BASOS ABS: 0 10*3/uL (ref 0.0–0.1)
BASOS PCT: 0 %
EOS ABS: 0 10*3/uL (ref 0.0–0.7)
Eosinophils Relative: 1 %
Lymphocytes Relative: 6 %
Lymphs Abs: 0.2 10*3/uL — ABNORMAL LOW (ref 0.7–4.0)
MONO ABS: 0 10*3/uL — AB (ref 0.1–1.0)
MONOS PCT: 0 %
Neutro Abs: 2.7 10*3/uL (ref 1.7–7.7)
Neutrophils Relative %: 93 %

## 2017-04-07 LAB — PROCALCITONIN: PROCALCITONIN: 16.81 ng/mL

## 2017-04-07 LAB — CBG MONITORING, ED: Glucose-Capillary: 128 mg/dL — ABNORMAL HIGH (ref 65–99)

## 2017-04-07 LAB — APTT: APTT: 24 s (ref 24–36)

## 2017-04-07 LAB — PROTIME-INR
INR: 1.01
PROTHROMBIN TIME: 13.2 s (ref 11.4–15.2)

## 2017-04-07 LAB — BRAIN NATRIURETIC PEPTIDE: B NATRIURETIC PEPTIDE 5: 472.8 pg/mL — AB (ref 0.0–100.0)

## 2017-04-07 MED ORDER — RISAQUAD PO CAPS
1.0000 | ORAL_CAPSULE | Freq: Every day | ORAL | Status: DC
  Administered 2017-04-07 – 2017-04-11 (×5): 1 via ORAL
  Filled 2017-04-07 (×5): qty 1

## 2017-04-07 MED ORDER — DEXTROSE 5 % IV SOLN
2.0000 g | Freq: Two times a day (BID) | INTRAVENOUS | Status: DC
  Administered 2017-04-07: 2 g via INTRAVENOUS
  Filled 2017-04-07: qty 2

## 2017-04-07 MED ORDER — VANCOMYCIN HCL IN DEXTROSE 1-5 GM/200ML-% IV SOLN
1000.0000 mg | INTRAVENOUS | Status: AC
  Administered 2017-04-07: 1000 mg via INTRAVENOUS
  Filled 2017-04-07: qty 200

## 2017-04-07 MED ORDER — CYANOCOBALAMIN 500 MCG PO TABS
500.0000 ug | ORAL_TABLET | ORAL | Status: DC
  Administered 2017-04-08 – 2017-04-11 (×2): 500 ug via ORAL
  Filled 2017-04-07 (×2): qty 1

## 2017-04-07 MED ORDER — MAGNESIUM CITRATE PO SOLN
1.0000 | Freq: Every day | ORAL | Status: DC
  Administered 2017-04-07 – 2017-04-09 (×3): 1 via ORAL
  Filled 2017-04-07 (×4): qty 296

## 2017-04-07 MED ORDER — GLUCOMANNAN 1000 MG PO CAPS: 1000.0000 mg | ORAL_CAPSULE | Freq: Every day | ORAL | Status: DC

## 2017-04-07 MED ORDER — VITAMIN C 500 MG PO TABS
250.0000 mg | ORAL_TABLET | Freq: Every day | ORAL | Status: DC
  Administered 2017-04-07 – 2017-04-11 (×5): 250 mg via ORAL
  Filled 2017-04-07 (×5): qty 1

## 2017-04-07 MED ORDER — ADULT MULTIVITAMIN W/MINERALS CH
1.0000 | ORAL_TABLET | Freq: Every day | ORAL | Status: DC
  Administered 2017-04-07 – 2017-04-11 (×5): 1 via ORAL
  Filled 2017-04-07 (×5): qty 1

## 2017-04-07 MED ORDER — CRANBERRY 300 MG PO TABS: 1.0000 | ORAL_TABLET | Freq: Every day | ORAL | Status: DC

## 2017-04-07 MED ORDER — DEXTROSE 5 % IV SOLN
2.0000 g | INTRAVENOUS | Status: DC
  Administered 2017-04-07 – 2017-04-10 (×4): 2 g via INTRAVENOUS
  Filled 2017-04-07 (×4): qty 2

## 2017-04-07 MED ORDER — ZOLPIDEM TARTRATE 5 MG PO TABS: 5.0000 mg | ORAL_TABLET | Freq: Every evening | ORAL | Status: DC | PRN

## 2017-04-07 MED ORDER — LORATADINE 10 MG PO TABS
10.0000 mg | ORAL_TABLET | Freq: Every day | ORAL | Status: DC
  Administered 2017-04-07 – 2017-04-11 (×5): 10 mg via ORAL
  Filled 2017-04-07 (×6): qty 1

## 2017-04-07 MED ORDER — COLESTIPOL HCL 5 G PO PACK
5.0000 g | PACK | Freq: Two times a day (BID) | ORAL | Status: DC
  Administered 2017-04-07 – 2017-04-10 (×7): 5 g via ORAL
  Filled 2017-04-07 (×9): qty 1

## 2017-04-07 MED ORDER — TERAZOSIN HCL 5 MG PO CAPS
10.0000 mg | ORAL_CAPSULE | Freq: Every day | ORAL | Status: DC
  Administered 2017-04-07 – 2017-04-10 (×4): 10 mg via ORAL
  Filled 2017-04-07 (×4): qty 2

## 2017-04-07 MED ORDER — SODIUM CHLORIDE 0.9 % IV SOLN
INTRAVENOUS | Status: DC
  Administered 2017-04-07: 04:00:00 via INTRAVENOUS

## 2017-04-07 MED ORDER — CITALOPRAM HYDROBROMIDE 20 MG PO TABS
40.0000 mg | ORAL_TABLET | Freq: Every day | ORAL | Status: DC
  Administered 2017-04-07 – 2017-04-08 (×2): 40 mg via ORAL
  Filled 2017-04-07 (×2): qty 4
  Filled 2017-04-07: qty 2

## 2017-04-07 MED ORDER — MORPHINE SULFATE (PF) 4 MG/ML IV SOLN: 2.0000 mg | INTRAVENOUS | Status: DC | PRN

## 2017-04-07 MED ORDER — GRAPE SEED 100 MG PO CAPS: 100.0000 mg | ORAL_CAPSULE | Freq: Every day | ORAL | Status: DC

## 2017-04-07 MED ORDER — ONDANSETRON HCL 4 MG/2ML IJ SOLN: 4.0000 mg | Freq: Three times a day (TID) | INTRAMUSCULAR | Status: DC | PRN

## 2017-04-07 MED ORDER — METOPROLOL SUCCINATE ER 50 MG PO TB24
50.0000 mg | ORAL_TABLET | Freq: Every day | ORAL | Status: DC
  Administered 2017-04-07: 50 mg via ORAL
  Filled 2017-04-07: qty 1

## 2017-04-07 MED ORDER — NABUMETONE 500 MG PO TABS
500.0000 mg | ORAL_TABLET | Freq: Two times a day (BID) | ORAL | Status: DC
  Administered 2017-04-07 – 2017-04-11 (×9): 500 mg via ORAL
  Filled 2017-04-07 (×9): qty 1

## 2017-04-07 MED ORDER — VANCOMYCIN HCL IN DEXTROSE 750-5 MG/150ML-% IV SOLN
750.0000 mg | Freq: Two times a day (BID) | INTRAVENOUS | Status: DC
  Filled 2017-04-07: qty 150

## 2017-04-07 MED ORDER — ACETAMINOPHEN 325 MG PO TABS: 650.0000 mg | ORAL_TABLET | Freq: Four times a day (QID) | ORAL | Status: DC | PRN

## 2017-04-07 MED ORDER — OXYCODONE-ACETAMINOPHEN 5-325 MG PO TABS
1.0000 | ORAL_TABLET | ORAL | Status: DC | PRN
  Administered 2017-04-07: 1 via ORAL
  Filled 2017-04-07: qty 1

## 2017-04-07 MED ORDER — HYDRALAZINE HCL 20 MG/ML IJ SOLN
5.0000 mg | INTRAMUSCULAR | Status: DC | PRN
  Administered 2017-04-11: 5 mg via INTRAVENOUS
  Filled 2017-04-07: qty 1

## 2017-04-07 MED ORDER — PANTOPRAZOLE SODIUM 40 MG PO TBEC
40.0000 mg | DELAYED_RELEASE_TABLET | Freq: Every day | ORAL | Status: DC
  Administered 2017-04-07 – 2017-04-11 (×5): 40 mg via ORAL
  Filled 2017-04-07 (×5): qty 1

## 2017-04-07 NOTE — ED Notes (Signed)
PT NPO per provider order

## 2017-04-07 NOTE — Progress Notes (Signed)
PHARMACY - PHYSICIAN COMMUNICATION CRITICAL VALUE ALERT - BLOOD CULTURE IDENTIFICATION (BCID)  Results for orders placed or performed during the hospital encounter of 04/06/17  Blood Culture ID Panel (Reflexed) (Collected: 04/06/2017 11:34 PM)  Result Value Ref Range   Enterococcus species NOT DETECTED NOT DETECTED   Listeria monocytogenes NOT DETECTED NOT DETECTED   Staphylococcus species NOT DETECTED NOT DETECTED   Staphylococcus aureus NOT DETECTED NOT DETECTED   Streptococcus species NOT DETECTED NOT DETECTED   Streptococcus agalactiae NOT DETECTED NOT DETECTED   Streptococcus pneumoniae NOT DETECTED NOT DETECTED   Streptococcus pyogenes NOT DETECTED NOT DETECTED   Acinetobacter baumannii NOT DETECTED NOT DETECTED   Enterobacteriaceae species DETECTED (A) NOT DETECTED   Enterobacter cloacae complex NOT DETECTED NOT DETECTED   Escherichia coli NOT DETECTED NOT DETECTED   Klebsiella oxytoca NOT DETECTED NOT DETECTED   Klebsiella pneumoniae DETECTED (A) NOT DETECTED   Proteus species NOT DETECTED NOT DETECTED   Serratia marcescens NOT DETECTED NOT DETECTED   Carbapenem resistance NOT DETECTED NOT DETECTED   Haemophilus influenzae NOT DETECTED NOT DETECTED   Neisseria meningitidis NOT DETECTED NOT DETECTED   Pseudomonas aeruginosa NOT DETECTED NOT DETECTED   Candida albicans NOT DETECTED NOT DETECTED   Candida glabrata NOT DETECTED NOT DETECTED   Candida krusei NOT DETECTED NOT DETECTED   Candida parapsilosis NOT DETECTED NOT DETECTED   Candida tropicalis NOT DETECTED NOT DETECTED    Name of physician (or Provider) Contacted: Dr. Bonner Puna  Changes to prescribed antibiotics required: narrow vancomycin and cefepime to ceftriaxone 2gm IV q24h  Clovis Riley 04/07/2017  5:24 PM

## 2017-04-07 NOTE — Progress Notes (Signed)
Pharmacy Antibiotic Note  Sierra [name] is a 71 y.o. female s/p cystoscopy 2 days ago now with fever, chills and tachycardia admitted on 04/06/2017 with UTI.  Pharmacy has been consulted for cefepime and vancomycin dosing.  Plan: Cefepime 2 Gm IV q12h Vancomycin 1 Gm x1 then 750 mg IV q12h VT=15-20 mg/L F/u scr/cultures/levels     Temp (24hrs), Avg:100.7 F (38.2 C), Min:98.8 F (37.1 C), Max:103.9 F (39.9 C)   Recent Labs Lab 04/06/17 2220 04/06/17 2343  WBC 3.1*  --   CREATININE 0.94  --   LATICACIDVEN  --  4.36*    CrCl cannot be calculated (Unknown ideal weight.).    Allergies  Allergen Reactions  . Ace Inhibitors Cough  . Codeine Nausea Only  . Erythromycin Other (See Comments)    Stomach pain  . Iohexol Hives     Code: HIVES, Desc: patient states develops hives w/ iv contrast/mms     Antimicrobials this admission: 10/18 cefepime >>  1018 vancomycin >>   Dose adjustments this admission:   Microbiology results:  BCx:   UCx:    Sputum:    MRSA PCR:   Thank you for allowing pharmacy to be a part of this patient's care.  Dorrene German 04/07/2017 4:08 AM

## 2017-04-07 NOTE — ED Notes (Signed)
Call report to [facility] [phone] @ 1620

## 2017-04-07 NOTE — ED Notes (Signed)
Awaiting Pharmacy to bring Cefipime

## 2017-04-07 NOTE — Progress Notes (Signed)
PROGRESS NOTE  Subjective: Sierra [name] is a 71 y.o. female with a history of left ureteral stone s/p technically difficult/unsuccessful left ureteroscopy 10/15 who presented to the ED 10/17 with abrupt, severe left flank pain found to have a fever to 103F and rapid AFib concerning for sepsis. Vanc/cefepime were started after cultures drawn. Urology consulted.    Objective: BP (!) 102/53   Pulse (!) 111   Temp (!) 100.6 F (38.1 C) (Oral)   Resp 14   Ht 5\' 7"  (1.702 m)   Wt 86.6 kg (191 lb)   SpO2 94%   BMI 29.91 kg/m   Gen: Well-appearing Pulm: Bibasilar crackles, nonlabored on 2L by nasal cannula  CV: Irreg, rate in 100's, no murmur, no JVD, no edema GI: Soft, LLQ > L flank tenderness, mild suprapubic tenderness. No discrete CVA tenderness.  Neuro: Alert and oriented. No focal deficits. Skin: No significant wounds or rashes.   Assessment & Plan: Sepsis: Suspect urinary source with focal complaints and known stone, though urology favors search for alternative sources (UA findings also consistent with recent procedure without infection). No cutaneous sites noted. Initial lactic acid 4.3,nodown to 1.6.  - Check CXR given crackles, 2L (reported, but not documented, SpO2 85% on room air), and to r/o pneumonia (no convincing symptoms of this). Also BNP up to 472 - Continue broad spectrum abx pending culture data.   Left ureteral stone: With difficult anatomy not allowing retrograde ureteroscopy. Renal U/S without hydronephrosis.  - Urology considering left PCN placement and antegrade ureteroscopy, pending clinical course.  Paroxysmal atrial fibrillation with rapid ventricular response: Ventricular rate now controlled.  - Cardiology, Dr. Irish Lack, has not opted to start anticoagulation. CHA2DS2-VASc is 3. Will need to revisit this issue now that she's had a recurrent episode.  - Continue telemetry - Continue metoprolol  Essential HTN:  - Holding antihypertensives due to sepsis with  soft BPs.   History of cholecystectomy:  - Continue colestid (generic formulation has not been effective, per family)  Vance Gather, MD [facility] Pager [phone] 04/07/2017, 9:11 AM

## 2017-04-07 NOTE — Consult Note (Signed)
Urology Consult   Physician requesting consult: Dr. Florina Ou  Reason for consult: Fever and possible sepsis, ureteral stone  History of Present Illness: Sierra [name] is a 71 y.o. with a known left ureteral stone diagnosed on CT imaging on 03/21/17.  This was somewhat obscured due to artifact on her CT but still suggested a large distal left ureteral stone.  Dr. Alen Blew took the patient to the OR on 04/04/17 but attempts to identify or access the left ureter were unsuccessful due to a large cystocle/pelvic prolapse.  She presented to the ED now with fever to 103 degrees and tachycardia concerning for developing sepsis.  Pt is currently in atrial fibrillation as well.    Past Medical History:  Diagnosis Date  . Anxiety   . Arthritis    "joints" (09/19/2013)  . Atrial fibrillation ([place])   . Basal cell carcinoma of cheek    left  . Chronic lower back pain   . GERD (gastroesophageal reflux disease)   . H/O hiatal hernia   . Hypertension   . Migraine    "haven't had any in a long time" (09/19/2013)  . Pneumonia ~ 2012    Past Surgical History:  Procedure Laterality Date  . BASAL CELL CARCINOMA EXCISION Left    "cheek"  . CHOLECYSTECTOMY  1990's  . COLONOSCOPY WITH PROPOFOL N/A 07/05/2016   Procedure: COLONOSCOPY WITH PROPOFOL;  Surgeon: Garlan Fair, MD;  Location: [facility];  Service: Endoscopy;  Laterality: N/A;  . DILATION AND CURETTAGE OF UTERUS    . HERNIA REPAIR  2671   umbilical  . JOINT REPLACEMENT    . OOPHORECTOMY Bilateral   . REVISION TOTAL HIP ARTHROPLASTY Left 09/19/2013  . SHOULDER ARTHROSCOPY W/ ROTATOR CUFF REPAIR Right   . TMJ ARTHROPLASTY    . TOTAL HIP ARTHROPLASTY Left 2009  . TOTAL HIP ARTHROPLASTY Right 2011  . TOTAL HIP REVISION Left 09/19/2013   Procedure: LEFT TOTAL HIP REVISION;  Surgeon: Kerin Salen, MD;  Location: [facility];  Service: Orthopedics;  Laterality: Left;  . TUBAL LIGATION    . VAGINAL HYSTERECTOMY  1990's     Current Hospital  Medications:  Home meds:  Current Meds  Medication Sig  . amLODipine (NORVASC) 10 MG tablet Take 10 mg by mouth daily.  . Cholecalciferol (VITAMIN D-3) 5000 UNITS TABS Take 5,000 Units by mouth every Monday, Wednesday, and Friday.   . citalopram (CELEXA) 40 MG tablet Take 40 mg by mouth daily.  . COLESTID 5 g packet Take 5 g by mouth 2 (two) times daily.   Marland Kitchen CRANBERRY PO Take 1 tablet by mouth daily.  . fexofenadine (ALLEGRA) 180 MG tablet Take 180 mg by mouth daily.  . Glucomannan 1000 MG CAPS Take 1,000 mg by mouth daily.  . Grape Seed 100 MG CAPS Take 100 mg by mouth daily.   . hydrochlorothiazide (HYDRODIURIL) 25 MG tablet Take 25 mg by mouth daily.  Marland Kitchen losartan (COZAAR) 100 MG tablet Take 100 mg by mouth daily.  Marland Kitchen MAGNESIUM CITRATE PO Take 250 mg by mouth daily.  . metoprolol succinate (TOPROL-XL) 50 MG 24 hr tablet Take 50 mg by mouth daily. Take with or immediately following a meal.  . Multiple Vitamins-Minerals (MULTIVITAMIN WITH MINERALS) tablet Take 1 tablet by mouth daily. 50+ for her  . nabumetone (RELAFEN) 500 MG tablet Take 500 mg by mouth 2 (two) times daily. Will stop prior to procedure  . Probiotic Product (ALIGN) 4 MG CAPS Take 1 capsule  by mouth daily.  . RABEprazole (ACIPHEX) 20 MG tablet Take 20 mg by mouth daily.   Marland Kitchen terazosin (HYTRIN) 10 MG capsule Take 10 mg by mouth at bedtime.  . vitamin B-12 (CYANOCOBALAMIN) 500 MCG tablet Take 500 mcg by mouth every Monday, Wednesday, and Friday.   . vitamin C (ASCORBIC ACID) 250 MG tablet Take 250 mg by mouth daily.    Scheduled Meds: . ALIGN  1 capsule Oral Daily  . citalopram  40 mg Oral Daily  . colestipol  5 g Oral BID  . Cranberry  1 tablet Oral Daily  . Glucomannan  1,000 mg Oral Daily  . Grape Seed  100 mg Oral Daily  . loratadine  10 mg Oral Daily  . magnesium citrate  1 Bottle Oral Daily  . metoprolol succinate  50 mg Oral Daily  . multivitamin with minerals  1 tablet Oral Daily  . nabumetone  500 mg Oral BID   . pantoprazole  40 mg Oral Daily  . terazosin  10 mg Oral QHS  . [START ON 04/08/2017] vitamin B-12  500 mcg Oral Q M,W,F  . vitamin C  250 mg Oral Daily   Continuous Infusions: . sodium chloride 75 mL/hr at 04/07/17 0426  . ceFEPime (MAXIPIME) IV    . vancomycin 1,000 mg (04/07/17 0507)  . vancomycin     PRN Meds:.acetaminophen, hydrALAZINE, morphine injection, ondansetron, oxyCODONE-acetaminophen, zolpidem  Allergies:  Allergies  Allergen Reactions  . Ace Inhibitors Cough  . Codeine Nausea Only  . Erythromycin Other (See Comments)    Stomach pain  . Iohexol Hives     Code: HIVES, Desc: patient states develops hives w/ iv contrast/mms     Family History  Problem Relation Age of Onset  . Heart attack Mother   . Heart attack Maternal Aunt   . Heart attack Paternal Uncle     Social History:  reports that she has never smoked. She has never used smokeless tobacco. She reports that she drinks alcohol. She reports that she does not use drugs.  ROS: A complete review of systems was performed.  All systems are negative except for pertinent findings as noted.  Physical Exam:  Vital signs in last 24 hours: Temp:  [98.8 F (37.1 C)-103.9 F (39.9 C)] 100.6 F (38.1 C) (10/18 0239) Pulse Rate:  [88-137] 137 (10/18 0430) Resp:  [15-20] 20 (10/18 0430) BP: (124-215)/(53-80) 124/55 (10/18 0430) SpO2:  [90 %-97 %] 94 % (10/18 0430) Weight:  [86.6 kg (191 lb)] 86.6 kg (191 lb) (10/18 0441) Constitutional:  Alert and oriented, No acute distress Cardiovascular: Irregular, tachycardic, No JVD Respiratory: Normal respiratory effort, Lungs clear bilaterally GI: Abdomen is soft, tender in left CVA and left abdomen GU: No CVA tenderness Lymphatic: No lymphadenopathy Neurologic: Grossly intact, no focal deficits Psychiatric: Normal mood and affect  Laboratory Data:   Recent Labs  04/06/17 2220  WBC 3.1*  HGB 12.8  HCT 37.2  PLT 172     Recent Labs  04/06/17 2220  NA  138  K 3.6  CL 101  GLUCOSE 139*  BUN 33*  CALCIUM 9.3  CREATININE 0.94     Results for orders placed or performed during the hospital encounter of 04/06/17 (from the past 24 hour(s))  Urinalysis, Routine w reflex microscopic- may I&O cath if menses     Status: Abnormal   Collection Time: 04/06/17 10:18 PM  Result Value Ref Range   Color, Urine YELLOW YELLOW   APPearance CLEAR CLEAR  Specific Gravity, Urine 1.014 1.005 - 1.030   pH 5.0 5.0 - 8.0   Glucose, UA NEGATIVE NEGATIVE mg/dL   Hgb urine dipstick MODERATE (A) NEGATIVE   Bilirubin Urine NEGATIVE NEGATIVE   Ketones, ur NEGATIVE NEGATIVE mg/dL   Protein, ur NEGATIVE NEGATIVE mg/dL   Nitrite NEGATIVE NEGATIVE   Leukocytes, UA MODERATE (A) NEGATIVE   RBC / HPF 6-30 0 - 5 RBC/hpf   WBC, UA 6-30 0 - 5 WBC/hpf   Bacteria, UA RARE (A) NONE SEEN   Squamous Epithelial / LPF 0-5 (A) NONE SEEN   Mucus PRESENT   Basic metabolic panel     Status: Abnormal   Collection Time: 04/06/17 10:20 PM  Result Value Ref Range   Sodium 138 135 - 145 mmol/L   Potassium 3.6 3.5 - 5.1 mmol/L   Chloride 101 101 - 111 mmol/L   CO2 24 22 - 32 mmol/L   Glucose, Bld 139 (H) 65 - 99 mg/dL   BUN 33 (H) 6 - 20 mg/dL   Creatinine, Ser 0.94 0.44 - 1.00 mg/dL   Calcium 9.3 8.9 - 10.3 mg/dL   GFR calc non Af Amer 59 (L) >60 mL/min   GFR calc Af Amer >60 >60 mL/min   Anion gap 13 5 - 15  CBC     Status: Abnormal   Collection Time: 04/06/17 10:20 PM  Result Value Ref Range   WBC 3.1 (L) 4.0 - 10.5 K/uL   RBC 4.10 3.87 - 5.11 MIL/uL   Hemoglobin 12.8 12.0 - 15.0 g/dL   HCT 37.2 36.0 - 46.0 %   MCV 90.7 78.0 - 100.0 fL   MCH 31.2 26.0 - 34.0 pg   MCHC 34.4 30.0 - 36.0 g/dL   RDW 13.3 11.5 - 15.5 %   Platelets 172 150 - 400 K/uL  Differential     Status: Abnormal   Collection Time: 04/06/17 10:20 PM  Result Value Ref Range   Neutrophils Relative % 93 %   Neutro Abs 2.7 1.7 - 7.7 K/uL   Lymphocytes Relative 6 %   Lymphs Abs 0.2 (L) 0.7 - 4.0  K/uL   Monocytes Relative 0 %   Monocytes Absolute 0.0 (L) 0.1 - 1.0 K/uL   Eosinophils Relative 1 %   Eosinophils Absolute 0.0 0.0 - 0.7 K/uL   Basophils Relative 0 %   Basophils Absolute 0.0 0.0 - 0.1 K/uL  I-Stat CG4 Lactic Acid, ED     Status: Abnormal   Collection Time: 04/06/17 11:43 PM  Result Value Ref Range   Lactic Acid, Venous 4.36 (HH) 0.5 - 1.9 mmol/L   Comment NOTIFIED PHYSICIAN    No results found for this or any previous visit (from the past 240 hour(s)).  Renal Function:  Recent Labs  04/06/17 2220  CREATININE 0.94   Estimated Creatinine Clearance: 61.1 mL/min (by C-G formula based on SCr of 0.94 mg/dL).  Radiologic Imaging: US Renal  Result Date: 04/07/2017 CLINICAL DATA:  Evaluation for possible hydronephrosis. EXAM: RENAL / URINARY TRACT ULTRASOUND COMPLETE COMPARISON:  CT abdomen and pelvis 03/21/2017 FINDINGS: Right Kidney: Length: 11.3 cm. Echogenicity within normal limits. No mass or hydronephrosis visualized. Left Kidney: Length: 12 cm. Echogenicity within normal limits. No mass or hydronephrosis visualized. Bladder: No bladder wall thickening or filling defect. Urine flow jets are demonstrated bilaterally on color flow Doppler imaging. IMPRESSION: No evidence of hydronephrosis in either kidney. Electronically Signed   By: Lucienne Capers M.D.   On: 04/07/2017 02:44  I independently reviewed the above imaging studies.  Impression/Recommendation: Fever and left ureteral stone - There is not convincing evidence that the patient has left ureteral obstruction (no hydronephrosis) nor convincing evidence that her fever/infection is from a GU source (based on urinalysis findings which are consistent with a stone/recent procedure but not necessarily infection).  I would recommend looking for other potential sources of infection in this situation.  Regardless, retrograde access to the left ureter is not likely feasible considering the attempt by Dr. Gloriann Loan on  Monday.  If the patient does not have an alternative source for fever and does not clinically improve, it would be reasonable to consider drainage of the left kidney although this would likely need to be performed via percutaneous nephrostomy (which would currently be difficult in absence of hydronephrosis).   If patient does not clinically improve with antibiotic therapy, may need more urgent nephrostomy drainage.  Otherwise, may just benefit from nephrostomy placement electively in anticipation of needing antegrade ureteroscopy at some point after resolution of infection. Dr. Gloriann Loan notified of patient's admission.  Valor [place],LES 04/07/2017, 5:15 AM  Pryor Curia. MD   CC: Dr. Florina Ou Dr. Blaine Hamper

## 2017-04-07 NOTE — Progress Notes (Signed)
PHARMACIST - PHYSICIAN ORDER COMMUNICATION  CONCERNING: P&T Medication Policy on Herbal Medications  DESCRIPTION:  This patient's order for:  Cranberry, grapeseed caps, glucomannan  has been noted.  This product(s) is classified as an "herbal" or natural product. Due to a lack of definitive safety studies or FDA approval, nonstandard manufacturing practices, plus the potential risk of unknown drug-drug interactions while on inpatient medications, the Pharmacy and Therapeutics Committee does not permit the use of "herbal" or natural products of this type within [facility].   ACTION TAKEN: The pharmacy department is unable to verify this order at this time and the order has been discontinued. Please reevaluate patient's clinical condition at discharge and address if the herbal or natural product(s) should be resumed at that time.  Royetta Asal, PharmD, BCPS Pager [phone] 04/07/2017 9:33 AM

## 2017-04-07 NOTE — H&P (Signed)
History and Physical    Sierra [name]:[id number] DOB: 18-Jan-1946 DOA: 04/06/2017  Referring MD/NP/PA:   PCP: Lavone Orn, MD   Patient coming from:  The patient is coming from home.  At baseline, pt is independent for most of ADL.   Chief Complaint: Fever, chills, left flank pain, dysuria, and burning on urination  HPI: Sierra [name] is a 71 y.o. female with medical history significant of hypertension, hyperlipidemia, GERD, anxiety, migraine headaches, arthritis, chronic back pain, PAF not on anticoagulants, who presents with fever, chills, left flank pain, dysuria and burning on urination.  Pt underwent a cystoscopy 2 days ago for evaluation of her ureters. It is thought she had a calcification in the left ureter due to suspected ureteral deformity, the procedure was not completed. Plans are made to approach the ureter from the left kidney, but this has not yet been done. She had been doing well until this morning when she developed left flank pain, fever and chills. The left flank pain is constant, sharp, 10 out of 10 in severity, radiating to the left lower abdomen. She also has dysuria, burning on urination and increased urinary frequency. Denies nausea, vomiting, diarrhea. No chest pain, SOB, cough. No unilateral weakness.  ED Course: pt was found to have WBC 3.1, lactic acid of 4.36, electrolytes and renal function okay, urinalysis with moderate amount of leukocyte, temperature [place] 0.9, tachycardia, tachypnea, oxygen saturation section 90-93% on room air. Initially blood pressure is elevated at 2:15/80, which improved to 146/59 with pain medication. Renal ultrasound is negative for OBSTRUCTION or hydronephrosis. Patient is admitted to telemetry bed as inpatient. Urology, Dr. Alinda Money was consulted.  Review of Systems:   General: has fevers, chills, no body weight gain, has poor appetite, has fatigue HEENT: no blurry vision, hearing changes or sore throat Respiratory: no  dyspnea, coughing, wheezing CV: no chest pain, no palpitations GI: no nausea, vomiting, abdominal pain, diarrhea, constipation GU: has dysuria, burning on urination, increased urinary frequency Ext: has trace leg edema Neuro: no unilateral weakness, numbness, or tingling, no vision change or hearing loss Skin: no rash, no skin tear. MSK: No muscle spasm, no deformity, no limitation of range of movement in spin Heme: No easy bruising.  Travel history: No recent long distant travel.  Allergy:  Allergies  Allergen Reactions  . Ace Inhibitors Cough  . Codeine Nausea Only  . Erythromycin Other (See Comments)    Stomach pain  . Iohexol Hives     Code: HIVES, Desc: patient states develops hives w/ iv contrast/mms     Past Medical History:  Diagnosis Date  . Anxiety   . Arthritis    "joints" (09/19/2013)  . Atrial fibrillation ([place])   . Basal cell carcinoma of cheek    left  . Chronic lower back pain   . GERD (gastroesophageal reflux disease)   . H/O hiatal hernia   . Hypertension   . Migraine    "haven't had any in a long time" (09/19/2013)  . Pneumonia ~ 2012    Past Surgical History:  Procedure Laterality Date  . BASAL CELL CARCINOMA EXCISION Left    "cheek"  . CHOLECYSTECTOMY  1990's  . COLONOSCOPY WITH PROPOFOL N/A 07/05/2016   Procedure: COLONOSCOPY WITH PROPOFOL;  Surgeon: Garlan Fair, MD;  Location: [facility];  Service: Endoscopy;  Laterality: N/A;  . DILATION AND CURETTAGE OF UTERUS    . HERNIA REPAIR  5400   umbilical  . JOINT REPLACEMENT    .  OOPHORECTOMY Bilateral   . REVISION TOTAL HIP ARTHROPLASTY Left 09/19/2013  . SHOULDER ARTHROSCOPY W/ ROTATOR CUFF REPAIR Right   . TMJ ARTHROPLASTY    . TOTAL HIP ARTHROPLASTY Left 2009  . TOTAL HIP ARTHROPLASTY Right 2011  . TOTAL HIP REVISION Left 09/19/2013   Procedure: LEFT TOTAL HIP REVISION;  Surgeon: Kerin Salen, MD;  Location: [facility];  Service: Orthopedics;  Laterality: Left;  . TUBAL LIGATION    . VAGINAL  HYSTERECTOMY  1990's    Social History:  reports that she has never smoked. She has never used smokeless tobacco. She reports that she drinks alcohol. She reports that she does not use drugs.  Family History:  Family History  Problem Relation Age of Onset  . Heart attack Mother   . Heart attack Maternal Aunt   . Heart attack Paternal Uncle      Prior to Admission medications   Medication Sig Start Date End Date Taking? Authorizing Provider  amLODipine (NORVASC) 10 MG tablet Take 10 mg by mouth daily.   Yes [provider]  Cholecalciferol (VITAMIN D-3) 5000 UNITS TABS Take 5,000 Units by mouth every Monday, Wednesday, and Friday.    Yes [provider]  citalopram (CELEXA) 40 MG tablet Take 40 mg by mouth daily.   Yes [provider]  COLESTID 5 g packet Take 5 g by mouth 2 (two) times daily.  01/06/17  Yes [provider]  CRANBERRY PO Take 1 tablet by mouth daily.   Yes [provider]  fexofenadine (ALLEGRA) 180 MG tablet Take 180 mg by mouth daily.   Yes [provider]  Glucomannan 1000 MG CAPS Take 1,000 mg by mouth daily.   Yes [provider]  Grape Seed 100 MG CAPS Take 100 mg by mouth daily.    Yes [provider]  hydrochlorothiazide (HYDRODIURIL) 25 MG tablet Take 25 mg by mouth daily.   Yes [provider]  losartan (COZAAR) 100 MG tablet Take 100 mg by mouth daily. 03/06/17  Yes [provider]  MAGNESIUM CITRATE PO Take 250 mg by mouth daily.   Yes [provider]  metoprolol succinate (TOPROL-XL) 50 MG 24 hr tablet Take 50 mg by mouth daily. Take with or immediately following a meal.   Yes [provider]  Multiple Vitamins-Minerals (MULTIVITAMIN WITH MINERALS) tablet Take 1 tablet by mouth daily. 50+ for her   Yes [provider]  nabumetone (RELAFEN) 500 MG tablet Take 500 mg by mouth 2 (two) times daily. Will stop prior to procedure   Yes [provider]  Probiotic Product (ALIGN) 4 MG CAPS Take 1 capsule by mouth daily.   Yes [provider]  RABEprazole (ACIPHEX) 20 MG tablet Take 20 mg by mouth daily.    Yes [provider]  terazosin (HYTRIN) 10 MG capsule Take 10 mg by mouth at bedtime.   Yes [provider]  vitamin B-12 (CYANOCOBALAMIN) 500 MCG tablet Take 500 mcg by mouth every Monday, Wednesday, and Friday.    Yes [provider]  vitamin C (ASCORBIC ACID) 250 MG tablet Take 250 mg by mouth daily.   Yes [provider]    Physical Exam: Vitals:   04/07/17 0530 04/07/17 0600 04/07/17 0630 04/07/17 0700  BP: (!) 112/51 (!) 124/56 (!) 113/55 (!) 102/53  Pulse: (!) 106 90 100 (!) 111  Resp: 18 18 16 14   Temp:      TempSrc:  SpO2: 94% 94% 93% 94%  Weight:      Height:       General: Not in acute distress HEENT:       Eyes: PERRL, EOMI, no scleral icterus.       ENT: No discharge from the ears and nose, no pharynx injection, no tonsillar enlargement.        Neck: No JVD, no bruit, no mass felt. Heme: No neck lymph node enlargement. Cardiac: S1/S2, RRR, No murmurs, No gallops or rubs. Respiratory: No rales, wheezing, rhonchi or rubs. GI: Soft, nondistended, nontender, no rebound pain, no organomegaly, BS present. GU: has left CVA tenderness. Ext: No pitting leg edema bilaterally. 2+DP/PT pulse bilaterally. Musculoskeletal: No joint deformities, No joint redness or warmth, no limitation of ROM in spin. Skin: No rashes.  Neuro: Alert, oriented X3, cranial nerves II-XII grossly intact, moves all extremities normally Psych: Patient is not psychotic, no suicidal or hemocidal ideation.  Labs on Admission: I have personally reviewed following labs and imaging studies  CBC:  Recent Labs Lab 04/06/17 2220  WBC 3.1*  NEUTROABS 2.7  HGB 12.8  HCT 37.2  MCV 90.7  PLT 037   Basic Metabolic Panel:  Recent Labs Lab 04/06/17 2220  NA 138  K 3.6  CL 101  CO2 24   GLUCOSE 139*  BUN 33*  CREATININE 0.94  CALCIUM 9.3   GFR: Estimated Creatinine Clearance: 61.1 mL/min (by C-G formula based on SCr of 0.94 mg/dL). Liver Function Tests: No results for input(s): AST, ALT, ALKPHOS, BILITOT, PROT, ALBUMIN in the last 168 hours. No results for input(s): LIPASE, AMYLASE in the last 168 hours. No results for input(s): AMMONIA in the last 168 hours. Coagulation Profile:  Recent Labs Lab 04/07/17 0405  INR 1.01   Cardiac Enzymes: No results for input(s): CKTOTAL, CKMB, CKMBINDEX, TROPONINI in the last 168 hours. BNP (last 3 results) No results for input(s): PROBNP in the last 8760 hours. HbA1C: No results for input(s): HGBA1C in the last 72 hours. CBG: No results for input(s): GLUCAP in the last 168 hours. Lipid Profile: No results for input(s): CHOL, HDL, LDLCALC, TRIG, CHOLHDL, LDLDIRECT in the last 72 hours. Thyroid Function Tests: No results for input(s): TSH, T4TOTAL, FREET4, T3FREE, THYROIDAB in the last 72 hours. Anemia Panel: No results for input(s): VITAMINB12, FOLATE, FERRITIN, TIBC, IRON, RETICCTPCT in the last 72 hours. Urine analysis:    Component Value Date/Time   COLORURINE YELLOW 04/06/2017 [place] 04/06/2017 2218   LABSPEC 1.014 04/06/2017 2218   PHURINE 5.0 04/06/2017 2218   GLUCOSEU NEGATIVE 04/06/2017 2218   HGBUR MODERATE (A) 04/06/2017 2218   BILIRUBINUR NEGATIVE 04/06/2017 2218   KETONESUR NEGATIVE 04/06/2017 2218   PROTEINUR NEGATIVE 04/06/2017 2218   UROBILINOGEN 0.2 09/22/2013 0119   NITRITE NEGATIVE 04/06/2017 2218   LEUKOCYTESUR MODERATE (A) 04/06/2017 2218   Sepsis Labs: @LABRCNTIP (procalcitonin:4,lacticidven:4) )No results found for this or any previous visit (from the past 240 hour(s)).   Radiological Exams on Admission: US Renal  Result Date: 04/07/2017 CLINICAL DATA:  Evaluation for possible hydronephrosis. EXAM: RENAL / URINARY TRACT ULTRASOUND COMPLETE COMPARISON:  CT abdomen and  pelvis 03/21/2017 FINDINGS: Right Kidney: Length: 11.3 cm. Echogenicity within normal limits. No mass or hydronephrosis visualized. Left Kidney: Length: 12 cm. Echogenicity within normal limits. No mass or hydronephrosis visualized. Bladder: No bladder wall thickening or filling defect. Urine flow jets are demonstrated bilaterally on color flow Doppler imaging. IMPRESSION: No evidence of hydronephrosis in either kidney. Electronically Signed  By: Lucienne Capers M.D.   On: 04/07/2017 02:44     EKG: Independently reviewed. Atrial fibrillation, QTC 482, nonspecific T-wave change  Assessment/Plan Principal Problem:   Complicated UTI (urinary tract infection) Active Problems:   PAF (paroxysmal atrial fibrillation) (HCC)   Essential hypertension   Sepsis (HCC)   Depression   HLD (hyperlipidemia)   Arthritis   Sepsis due to complicated UTI and possible pyelonephritis: Patient meets criteria for sepsis with fever, tachycardia and tachypnea, WBC 3.1. Lactic acid is elevated. Hemodynamically stable. Urology was consulted.  -  Admit to telemetry bed for obs -  IV vancomycin and cefepime - Follow up results of urine and blood cx and amend antibiotic regimen if needed per sensitivity results - prn Zofran for nausea, and percocet and morphine for pain - will get Procalcitonin and trend lactic acid levels per sepsis protocol. - IVF: 2.6 L of NS bolus in ED, followed by 75 cc/h  - f/u urologist's recommendations  Atrial Fibrillation: CHA2DS2-VASc Score is 3, needs oral anticoagulation, but pt is not on AC. She states that she had Afib when she had GI viral infection last year. Her cardiologist, Dr. Irish Lack did not recommend to start anticoagulants per patient. Now pt has recurrent A fib today. She may need to start [facility]. Will not start AC now in case pt needs any procedure. -tele monitoring. -continue metoprolol  Essential hypertension: -hold amlodipine, HCTZ Cozaar since patient is tender risk of  developing hypotension due to sepsis -Continue metoprolol extended IV hydralazine when necessary  Depression: Stable, no suicidal or homicidal ideations. -Continue home medications: Celexa  HLD: -Colestid  Arthritis: -relafen prn   DVT ppx: SCD Code Status: Full code Family Communication: Yes, patient's husband  at bed side Disposition Plan:  Anticipate discharge back to previous home environment Consults called:  Urology, Dr. Alinda Money Admission status:  Inpatient/tele     Date of Service 04/07/2017    Ivor Costa [facility] Pager [phone]  If 7PM-7AM, please contact night-coverage [email or web address] Password [email or web address] 04/07/2017, 7:06 AM

## 2017-04-08 ENCOUNTER — Other Ambulatory Visit (HOSPITAL_COMMUNITY): Payer: Medicare Other

## 2017-04-08 DIAGNOSIS — R7881 Bacteremia: Secondary | ICD-10-CM | POA: Diagnosis present

## 2017-04-08 DIAGNOSIS — B961 Klebsiella pneumoniae [K. pneumoniae] as the cause of diseases classified elsewhere: Secondary | ICD-10-CM | POA: Diagnosis present

## 2017-04-08 DIAGNOSIS — I480 Paroxysmal atrial fibrillation: Secondary | ICD-10-CM

## 2017-04-08 LAB — CBC
HEMATOCRIT: 29.9 % — AB (ref 36.0–46.0)
Hemoglobin: 10 g/dL — ABNORMAL LOW (ref 12.0–15.0)
MCH: 30.7 pg (ref 26.0–34.0)
MCHC: 33.4 g/dL (ref 30.0–36.0)
MCV: 91.7 fL (ref 78.0–100.0)
Platelets: 141 10*3/uL — ABNORMAL LOW (ref 150–400)
RBC: 3.26 MIL/uL — ABNORMAL LOW (ref 3.87–5.11)
RDW: 14.1 % (ref 11.5–15.5)
WBC: 10.2 10*3/uL (ref 4.0–10.5)

## 2017-04-08 LAB — GLUCOSE, CAPILLARY: Glucose-Capillary: 146 mg/dL — ABNORMAL HIGH (ref 65–99)

## 2017-04-08 LAB — BASIC METABOLIC PANEL
Anion gap: 8 (ref 5–15)
BUN: 28 mg/dL — AB (ref 6–20)
CHLORIDE: 104 mmol/L (ref 101–111)
CO2: 25 mmol/L (ref 22–32)
Calcium: 7.7 mg/dL — ABNORMAL LOW (ref 8.9–10.3)
Creatinine, Ser: 0.89 mg/dL (ref 0.44–1.00)
GFR calc Af Amer: >60 mL/min (ref >60)
GFR calc non Af Amer: >60 mL/min (ref >60)
Glucose, Bld: 118 mg/dL — ABNORMAL HIGH (ref 65–99)
POTASSIUM: 3.5 mmol/L (ref 3.5–5.1)
SODIUM: 137 mmol/L (ref 135–145)

## 2017-04-08 LAB — MAGNESIUM: Magnesium: 1.8 mg/dL (ref 1.7–2.4)

## 2017-04-08 MED ORDER — CITALOPRAM HYDROBROMIDE 20 MG PO TABS
20.0000 mg | ORAL_TABLET | Freq: Every day | ORAL | Status: DC
  Administered 2017-04-09 – 2017-04-11 (×3): 20 mg via ORAL
  Filled 2017-04-08 (×3): qty 1

## 2017-04-08 MED ORDER — METOPROLOL SUCCINATE ER 100 MG PO TB24
100.0000 mg | ORAL_TABLET | Freq: Every day | ORAL | Status: DC
  Administered 2017-04-08 – 2017-04-09 (×2): 100 mg via ORAL
  Filled 2017-04-08 (×2): qty 1

## 2017-04-08 NOTE — Progress Notes (Signed)
Urology Inpatient Progress Report  Arthritis [V89.38] Complicated urinary tract infection [N39.0] Hypoxia [R09.02] Bibasilar crackles [R09.89] Sepsis ([place]) [B01.7] Complicated UTI (urinary tract infection) [N39.0]        Intv/Subj: Episode of afib w/ rvr overnight. Rate improved Patient is without complaint except mild abd pain, improved. Feels better today. Bcx + for gram neg rods Low grade temp 99.8  Principal Problem:   Complicated UTI (urinary tract infection) Active Problems:   PAF (paroxysmal atrial fibrillation) (HCC)   Essential hypertension   Sepsis (HCC)   Depression   HLD (hyperlipidemia)   Arthritis  Current Facility-Administered Medications  Medication Dose Route Frequency Provider Last Rate Last Dose  . acetaminophen (TYLENOL) tablet 650 mg  650 mg Oral Q6H PRN Ivor Costa, MD      . acidophilus (RISAQUAD) capsule 1 capsule  1 capsule Oral Daily Ivor Costa, MD   1 capsule at 04/07/17 1001  . cefTRIAXone (ROCEPHIN) 2 g in dextrose 5 % 50 mL IVPB  2 g Intravenous Q24H Patrecia Pour, MD   Stopped at 04/07/17 2308  . citalopram (CELEXA) tablet 40 mg  40 mg Oral Daily Ivor Costa, MD   40 mg at 04/07/17 1001  . colestipol (COLESTID) packet 5 g  5 g Oral BID Ivor Costa, MD   5 g at 04/07/17 1111  . cyanocobalamin tablet 500 mcg  500 mcg Oral Q M,W,F Ivor Costa, MD      . hydrALAZINE (APRESOLINE) injection 5 mg  5 mg Intravenous Q2H PRN Ivor Costa, MD      . loratadine (CLARITIN) tablet 10 mg  10 mg Oral Daily Ivor Costa, MD   10 mg at 04/07/17 1001  . magnesium citrate solution 1 Bottle  1 Bottle Oral Daily Ivor Costa, MD   1 Bottle at 04/07/17 1006  . metoprolol succinate (TOPROL-XL) 24 hr tablet 100 mg  100 mg Oral Daily Vance Gather B, MD      . morphine 4 MG/ML injection 2 mg  2 mg Intravenous Q4H PRN Ivor Costa, MD      . multivitamin with minerals tablet 1 tablet  1 tablet Oral Daily Ivor Costa, MD   1 tablet at 04/07/17 1001  . nabumetone (RELAFEN) tablet 500  mg  500 mg Oral BID Ivor Costa, MD   500 mg at 04/07/17 2238  . ondansetron (ZOFRAN) injection 4 mg  4 mg Intravenous Q8H PRN Ivor Costa, MD      . oxyCODONE-acetaminophen (PERCOCET/ROXICET) 5-325 MG per tablet 1 tablet  1 tablet Oral Q4H PRN Ivor Costa, MD   1 tablet at 04/07/17 1959  . pantoprazole (PROTONIX) EC tablet 40 mg  40 mg Oral Daily Ivor Costa, MD   40 mg at 04/07/17 1001  . terazosin (HYTRIN) capsule 10 mg  10 mg Oral QHS Ivor Costa, MD   10 mg at 04/07/17 2238  . vitamin C (ASCORBIC ACID) tablet 250 mg  250 mg Oral Daily Ivor Costa, MD   250 mg at 04/07/17 1000  . zolpidem (AMBIEN) tablet 5 mg  5 mg Oral QHS PRN Ivor Costa, MD         Objective: Vital: Vitals:   04/07/17 1542 04/07/17 1656 04/07/17 2100 04/08/17 0533  BP: 115/84 (!) 117/54 120/61 (!) 151/64  Pulse: 98 94 96 94  Resp: 14 18 16 16   Temp: 99.1 F (37.3 C) 99.1 F (37.3 C) 99.8 F (37.7 C) 99.7 F (37.6 C)  TempSrc: Oral Oral Oral Oral  SpO2:  92% 95% 92% 92%  Weight:  93.1 kg (205 lb 4 oz)    Height:  5\' 7"  (1.702 m)     I/Os: I/O last 3 completed shifts: In: 361.3 [P.O.:120; I.V.:191.3; IV Piggyback:50] Out: 100 [Urine:100]  Physical Exam:  General: Patient is in no apparent distress Lungs: Normal respiratory effort, chest expands symmetrically. GI: The abdomen is soft and mild tenderness without mass. Ext: lower extremities symmetric  Lab Results:  Recent Labs  04/06/17 2220 04/08/17 0516  WBC 3.1* 10.2  HGB 12.8 10.0*  HCT 37.2 29.9*    Recent Labs  04/06/17 2220 04/08/17 0516  NA 138 137  K 3.6 3.5  CL 101 104  CO2 24 25  GLUCOSE 139* 118*  BUN 33* 28*  CREATININE 0.94 0.89  CALCIUM 9.3 7.7*    Recent Labs  04/07/17 0405  INR 1.01   No results for input(s): LABURIN in the last 72 hours. Results for orders placed or performed during the hospital encounter of 04/06/17  Blood culture (routine x 2)     Status: Abnormal (Preliminary result)   Collection Time: 04/06/17  11:34 PM  Result Value Ref Range Status   Specimen Description BLOOD RIGHT ANTECUBITAL  Final   Special Requests   Final    BOTTLES DRAWN AEROBIC AND ANAEROBIC Blood Culture adequate volume   Culture  Setup Time   Final    GRAM NEGATIVE RODS IN BOTH AEROBIC AND ANAEROBIC BOTTLES CRITICAL RESULT CALLED TO, READ BACK BY AND VERIFIED WITH: D. Zeigler Pharm.D. 17:20 04/07/17 ([email or web address])    Culture (A)  Final    KLEBSIELLA PNEUMONIAE SUSCEPTIBILITIES TO FOLLOW Performed at [facility], [place] [address], [place], [place] [address]    Report Status PENDING  Incomplete  Blood Culture ID Panel (Reflexed)     Status: Abnormal   Collection Time: 04/06/17 11:34 PM  Result Value Ref Range Status   Enterococcus species NOT DETECTED NOT DETECTED Final   Listeria monocytogenes NOT DETECTED NOT DETECTED Final   Staphylococcus species NOT DETECTED NOT DETECTED Final   Staphylococcus aureus NOT DETECTED NOT DETECTED Final   Streptococcus species NOT DETECTED NOT DETECTED Final   Streptococcus agalactiae NOT DETECTED NOT DETECTED Final   Streptococcus pneumoniae NOT DETECTED NOT DETECTED Final   Streptococcus pyogenes NOT DETECTED NOT DETECTED Final   Acinetobacter baumannii NOT DETECTED NOT DETECTED Final   Enterobacteriaceae species DETECTED (A) NOT DETECTED Final    Comment: Enterobacteriaceae represent a large family of gram-negative bacteria, not a single organism. CRITICAL RESULT CALLED TO, READ BACK BY AND VERIFIED WITH: D. Zeigler Pharm.D. 17:20 04/07/17 ([email or web address])    Enterobacter cloacae complex NOT DETECTED NOT DETECTED Final   Escherichia coli NOT DETECTED NOT DETECTED Final   Klebsiella oxytoca NOT DETECTED NOT DETECTED Final   Klebsiella pneumoniae DETECTED (A) NOT DETECTED Final    Comment: CRITICAL RESULT CALLED TO, READ BACK BY AND VERIFIED WITH: D. Zeigler Pharm.D. 17:20 04/07/17 ([email or web address])    Proteus species NOT DETECTED NOT DETECTED Final   Serratia marcescens NOT DETECTED  NOT DETECTED Final   Carbapenem resistance NOT DETECTED NOT DETECTED Final   Haemophilus influenzae NOT DETECTED NOT DETECTED Final   Neisseria meningitidis NOT DETECTED NOT DETECTED Final   Pseudomonas aeruginosa NOT DETECTED NOT DETECTED Final   Candida albicans NOT DETECTED NOT DETECTED Final   Candida glabrata NOT DETECTED NOT DETECTED Final   Candida krusei NOT DETECTED NOT DETECTED Final   Candida parapsilosis NOT DETECTED NOT DETECTED Final  Candida tropicalis NOT DETECTED NOT DETECTED Final  Blood culture (routine x 2)     Status: None (Preliminary result)   Collection Time: 04/06/17 11:39 PM  Result Value Ref Range Status   Specimen Description BLOOD LEFT ANTECUBITAL  Final   Special Requests   Final    BOTTLES DRAWN AEROBIC AND ANAEROBIC Blood Culture adequate volume   Culture  Setup Time   Final    GRAM NEGATIVE RODS IN BOTH AEROBIC AND ANAEROBIC BOTTLES CRITICAL VALUE NOTED.  VALUE IS CONSISTENT WITH PREVIOUSLY REPORTED AND CALLED VALUE. Performed at [facility], [place] [address], [place], [place] [address]    Culture GRAM NEGATIVE RODS  Final   Report Status PENDING  Incomplete    Studies/Results: Dg Chest 2 View  Result Date: 04/07/2017 CLINICAL DATA:  Left flank pain.  History of left ureteral stone. EXAM: CHEST  2 VIEW COMPARISON:  09/11/2013 FINDINGS: Cardiomegaly. No confluent opacities, effusions or edema. No acute bony abnormality. IMPRESSION: Cardiomegaly.  No active disease. Electronically Signed   By: Rolm Baptise M.D.   On: 04/07/2017 10:48   US Renal  Result Date: 04/07/2017 CLINICAL DATA:  Evaluation for possible hydronephrosis. EXAM: RENAL / URINARY TRACT ULTRASOUND COMPLETE COMPARISON:  CT abdomen and pelvis 03/21/2017 FINDINGS: Right Kidney: Length: 11.3 cm. Echogenicity within normal limits. No mass or hydronephrosis visualized. Left Kidney: Length: 12 cm. Echogenicity within normal limits. No mass or hydronephrosis visualized. Bladder: No  bladder wall thickening or filling defect. Urine flow jets are demonstrated bilaterally on color flow Doppler imaging. IMPRESSION: No evidence of hydronephrosis in either kidney. Electronically Signed   By: Lucienne Capers M.D.   On: 04/07/2017 02:44    Assessment: 71 YO F with likely left ureteral calculus s/p attempted retrograde ureteroscopy that was not possible due to inability to locate ureteral orifice. She is unsure of her GU medical history and unsure if she had a ureteral reimplant in the past.   Renal US revealed a ureteral Jet on the left and there was no hydronephrosis indicating no evidence of obstruction.  Plan: Patient clinically improving. Agree with antibiotics.  Will likely need at least 2 weeks of antibiotics followed by antegrade ureteroscopy electively. She would need to be off blood thinners to have this performed.   Link Snuffer, MD Urology 04/08/2017, 9:25 AM

## 2017-04-08 NOTE — Progress Notes (Addendum)
PROGRESS NOTE  [name]  XLK:[id number] DOB: 1945/07/21 DOA: 04/06/2017 PCP: Lavone Orn, MD  Outpatient Specialists: Cardiology, varanasi.  Brief Narrative: Sierra [name] is a 71 y.o. female with a history of left ureteral stone s/p technically difficult/unsuccessful left ureteroscopy 10/15 who presented to the ED 10/17 with abrupt, severe left flank pain found to have a fever to 103F and rapid AFib concerning for sepsis. Vanc/cefepime were started after cultures drawn. Blood cultures grew Klebsiella pneumoniae. She has defervesced with targeted antibiotics. Urology consulted, recommending elective antegrade ureteroscopy as outpatient after antibiotics. Heart rate has improved.   Assessment & Plan: Principal Problem:   Complicated UTI (urinary tract infection) Active Problems:   PAF (paroxysmal atrial fibrillation) (HCC)   Essential hypertension   Sepsis ([place])   Depression   HLD (hyperlipidemia)   Arthritis  Sepsis due to Klebsiella bacteremia: Sepsis resolving. Initial lactic acid 4.3,now down to 1.6.  - Continue ceftriaxone, getting echo.  - Discussed with IF, Dr. Johnnye Sima. Will need 5 days of IV antibiotics followed by a total of 2 weeks po abx.   Left ureteral stone: With difficult anatomy not allowing retrograde ureteroscopy. Renal U/S without hydronephrosis.  - Per urology, will consider elective antegrade ureteroscopy after antibiotics. Would need to be off anticoagulation for that.  Paroxysmal atrial fibrillation with rapid ventricular response: Ventricular rate now controlled.  - Cardiology, Dr. Irish Lack, has not opted to start anticoagulation. CHA2DS2-VASc is 3. Will discussed with cardiology.  - Continue telemetry - Continue metoprolol, will increase the dose for now.   Elevated BNP: No hypoxia or clinical evidence of volume overload at this time, suspect related to AFib.  - Monitor echocardiogram.   Essential HTN:  - Holding some antihypertensives (HCTZ,  losartan) due to sepsis with soft BPs.   History of cholecystectomy:  - Continue colestid (generic formulation has not been effective, per family)  Depression: Chronic, stable - Decrease dose celexa to 20mg  daily due to age and risk of QTc.   Constipation:  - Mag citrate, then resume regular bowel regimen  DVT prophylaxis: SCDs Code Status: Full Family Communication: None at bedside this AM Disposition Plan: [place] home  Consultants:   Urology  Procedures:   Echocardiogram ordered  Antimicrobials:  Vanc/cefepime > ceftriaxone   Subjective: Feels better, but still "blah." Somewhat weak. Having low back pain worse while in the bed but resolved at this time. Eating ok.   Objective: Vitals:   04/07/17 1542 04/07/17 1656 04/07/17 2100 04/08/17 0533  BP: 115/84 (!) 117/54 120/61 (!) 151/64  Pulse: 98 94 96 94  Resp: 14 18 16 16   Temp: 99.1 F (37.3 C) 99.1 F (37.3 C) 99.8 F (37.7 C) 99.7 F (37.6 C)  TempSrc: Oral Oral Oral Oral  SpO2: 92% 95% 92% 92%  Weight:  93.1 kg (205 lb 4 oz)    Height:  5\' 7"  (1.702 m)      Intake/Output Summary (Last 24 hours) at 04/08/17 1113 Last data filed at 04/08/17 0500  Gross per 24 hour  Intake           361.25 ml  Output              100 ml  Net           261.25 ml   Filed Weights   04/07/17 0441 04/07/17 1656  Weight: 86.6 kg (191 lb) 93.1 kg (205 lb 4 oz)    Gen: 71 y.o. female in no distress Pulm: Non-labored breathing  room air. Clear to auscultation bilaterally.  CV: Regular rate and rhythm. No murmur, rub, or gallop. No JVD, no pedal edema. GI: Abdomen soft, mildly tender in LLQ, non-distended, with normoactive bowel sounds. No organomegaly or masses felt. Ext: Warm, no deformities Skin: No rashes, lesions no ulcers Neuro: Alert and oriented. No focal neurological deficits. Psych: Judgement and insight appear normal. Mood & affect appropriate.   Data Reviewed: I have personally reviewed following labs and  imaging studies  CBC:  Recent Labs Lab 04/06/17 2220 04/08/17 0516  WBC 3.1* 10.2  NEUTROABS 2.7  --   HGB 12.8 10.0*  HCT 37.2 29.9*  MCV 90.7 91.7  PLT 172 497*   Basic Metabolic Panel:  Recent Labs Lab 04/06/17 2220 04/08/17 0516  NA 138 137  K 3.6 3.5  CL 101 104  CO2 24 25  GLUCOSE 139* 118*  BUN 33* 28*  CREATININE 0.94 0.89  CALCIUM 9.3 7.7*  MG  --  1.8   GFR: Estimated Creatinine Clearance: 66.9 mL/min (by C-G formula based on SCr of 0.89 mg/dL). Liver Function Tests: No results for input(s): AST, ALT, ALKPHOS, BILITOT, PROT, ALBUMIN in the last 168 hours. No results for input(s): LIPASE, AMYLASE in the last 168 hours. No results for input(s): AMMONIA in the last 168 hours. Coagulation Profile:  Recent Labs Lab 04/07/17 0405  INR 1.01   Cardiac Enzymes: No results for input(s): CKTOTAL, CKMB, CKMBINDEX, TROPONINI in the last 168 hours. BNP (last 3 results) No results for input(s): PROBNP in the last 8760 hours. HbA1C: No results for input(s): HGBA1C in the last 72 hours. CBG:  Recent Labs Lab 04/07/17 0745 04/08/17 0742  GLUCAP 128* 146*   Lipid Profile: No results for input(s): CHOL, HDL, LDLCALC, TRIG, CHOLHDL, LDLDIRECT in the last 72 hours. Thyroid Function Tests: No results for input(s): TSH, T4TOTAL, FREET4, T3FREE, THYROIDAB in the last 72 hours. Anemia Panel: No results for input(s): VITAMINB12, FOLATE, FERRITIN, TIBC, IRON, RETICCTPCT in the last 72 hours. Urine analysis:    Component Value Date/Time   COLORURINE YELLOW 04/06/2017 2218   APPEARANCEUR CLEAR 04/06/2017 2218   LABSPEC 1.014 04/06/2017 2218   PHURINE 5.0 04/06/2017 2218   GLUCOSEU NEGATIVE 04/06/2017 2218   HGBUR MODERATE (A) 04/06/2017 2218   BILIRUBINUR NEGATIVE 04/06/2017 [place] 04/06/2017 2218   PROTEINUR NEGATIVE 04/06/2017 2218   UROBILINOGEN 0.2 09/22/2013 0119   NITRITE NEGATIVE 04/06/2017 2218   LEUKOCYTESUR MODERATE (A) 04/06/2017  2218   Recent Results (from the past 240 hour(s))  Blood culture (routine x 2)     Status: Abnormal (Preliminary result)   Collection Time: 04/06/17 11:34 PM  Result Value Ref Range Status   Specimen Description BLOOD RIGHT ANTECUBITAL  Final   Special Requests   Final    BOTTLES DRAWN AEROBIC AND ANAEROBIC Blood Culture adequate volume   Culture  Setup Time   Final    GRAM NEGATIVE RODS IN BOTH AEROBIC AND ANAEROBIC BOTTLES CRITICAL RESULT CALLED TO, READ BACK BY AND VERIFIED WITH: D. Zeigler Pharm.D. 17:20 04/07/17 ([email or web address])    Culture (A)  Final    KLEBSIELLA PNEUMONIAE SUSCEPTIBILITIES TO FOLLOW Performed at [facility], [place] [address], [place], [place] [address]    Report Status PENDING  Incomplete  Blood Culture ID Panel (Reflexed)     Status: Abnormal   Collection Time: 04/06/17 11:34 PM  Result Value Ref Range Status   Enterococcus species NOT DETECTED NOT DETECTED Final   Listeria  monocytogenes NOT DETECTED NOT DETECTED Final   Staphylococcus species NOT DETECTED NOT DETECTED Final   Staphylococcus aureus NOT DETECTED NOT DETECTED Final   Streptococcus species NOT DETECTED NOT DETECTED Final   Streptococcus agalactiae NOT DETECTED NOT DETECTED Final   Streptococcus pneumoniae NOT DETECTED NOT DETECTED Final   Streptococcus pyogenes NOT DETECTED NOT DETECTED Final   Acinetobacter baumannii NOT DETECTED NOT DETECTED Final   Enterobacteriaceae species DETECTED (A) NOT DETECTED Final    Comment: Enterobacteriaceae represent a large family of gram-negative bacteria, not a single organism. CRITICAL RESULT CALLED TO, READ BACK BY AND VERIFIED WITH: D. Zeigler Pharm.D. 17:20 04/07/17 ([email or web address])    Enterobacter cloacae complex NOT DETECTED NOT DETECTED Final   Escherichia coli NOT DETECTED NOT DETECTED Final   Klebsiella oxytoca NOT DETECTED NOT DETECTED Final   Klebsiella pneumoniae DETECTED (A) NOT DETECTED Final    Comment: CRITICAL RESULT CALLED TO, READ BACK BY AND  VERIFIED WITH: D. Zeigler Pharm.D. 17:20 04/07/17 ([email or web address])    Proteus species NOT DETECTED NOT DETECTED Final   Serratia marcescens NOT DETECTED NOT DETECTED Final   Carbapenem resistance NOT DETECTED NOT DETECTED Final   Haemophilus influenzae NOT DETECTED NOT DETECTED Final   Neisseria meningitidis NOT DETECTED NOT DETECTED Final   Pseudomonas aeruginosa NOT DETECTED NOT DETECTED Final   Candida albicans NOT DETECTED NOT DETECTED Final   Candida glabrata NOT DETECTED NOT DETECTED Final   Candida krusei NOT DETECTED NOT DETECTED Final   Candida parapsilosis NOT DETECTED NOT DETECTED Final   Candida tropicalis NOT DETECTED NOT DETECTED Final  Blood culture (routine x 2)     Status: None (Preliminary result)   Collection Time: 04/06/17 11:39 PM  Result Value Ref Range Status   Specimen Description BLOOD LEFT ANTECUBITAL  Final   Special Requests   Final    BOTTLES DRAWN AEROBIC AND ANAEROBIC Blood Culture adequate volume   Culture  Setup Time   Final    GRAM NEGATIVE RODS IN BOTH AEROBIC AND ANAEROBIC BOTTLES CRITICAL VALUE NOTED.  VALUE IS CONSISTENT WITH PREVIOUSLY REPORTED AND CALLED VALUE. Performed at [facility], [place] [address] [place], [place] [address]    Culture GRAM NEGATIVE RODS  Final   Report Status PENDING  Incomplete      Radiology Studies: Dg Chest 2 View  Result Date: 04/07/2017 CLINICAL DATA:  Left flank pain.  History of left ureteral stone. EXAM: CHEST  2 VIEW COMPARISON:  09/11/2013 FINDINGS: Cardiomegaly. No confluent opacities, effusions or edema. No acute bony abnormality. IMPRESSION: Cardiomegaly.  No active disease. Electronically Signed   By: Rolm Baptise M.D.   On: 04/07/2017 10:48   US Renal  Result Date: 04/07/2017 CLINICAL DATA:  Evaluation for possible hydronephrosis. EXAM: RENAL / URINARY TRACT ULTRASOUND COMPLETE COMPARISON:  CT abdomen and pelvis 03/21/2017 FINDINGS: Right Kidney: Length: 11.3 cm. Echogenicity within normal limits. No  mass or hydronephrosis visualized. Left Kidney: Length: 12 cm. Echogenicity within normal limits. No mass or hydronephrosis visualized. Bladder: No bladder wall thickening or filling defect. Urine flow jets are demonstrated bilaterally on color flow Doppler imaging. IMPRESSION: No evidence of hydronephrosis in either kidney. Electronically Signed   By: Lucienne Capers M.D.   On: 04/07/2017 02:44    Scheduled Meds: . acidophilus  1 capsule Oral Daily  . [START ON 04/09/2017] citalopram  20 mg Oral Daily  . colestipol  5 g Oral BID  . cyanocobalamin  500 mcg Oral Q M,W,F  . loratadine  10 mg  Oral Daily  . magnesium citrate  1 Bottle Oral Daily  . metoprolol succinate  100 mg Oral Daily  . multivitamin with minerals  1 tablet Oral Daily  . nabumetone  500 mg Oral BID  . pantoprazole  40 mg Oral Daily  . terazosin  10 mg Oral QHS  . vitamin C  250 mg Oral Daily   Continuous Infusions: . cefTRIAXone (ROCEPHIN)  IV Stopped (04/07/17 2308)     LOS: 1 day   Time spent: 25 minutes.  Vance Gather, MD [facility] Pager [phone]  If 7PM-7AM, please contact night-coverage [email or web address] Password [email or web address] 04/08/2017, 11:13 AM

## 2017-04-09 ENCOUNTER — Inpatient Hospital Stay (HOSPITAL_COMMUNITY): Payer: Medicare Other

## 2017-04-09 DIAGNOSIS — R7881 Bacteremia: Secondary | ICD-10-CM

## 2017-04-09 DIAGNOSIS — A4151 Sepsis due to Escherichia coli [E. coli]: Secondary | ICD-10-CM

## 2017-04-09 DIAGNOSIS — I361 Nonrheumatic tricuspid (valve) insufficiency: Secondary | ICD-10-CM

## 2017-04-09 LAB — CULTURE, BLOOD (ROUTINE X 2)
SPECIAL REQUESTS: ADEQUATE
Special Requests: ADEQUATE

## 2017-04-09 LAB — URINE CULTURE

## 2017-04-09 LAB — GLUCOSE, CAPILLARY: GLUCOSE-CAPILLARY: 137 mg/dL — AB (ref 65–99)

## 2017-04-09 LAB — ECHOCARDIOGRAM COMPLETE
Height: 67 in
WEIGHTICAEL: 3283.97 [oz_av]

## 2017-04-09 MED ORDER — HYDROCHLOROTHIAZIDE 25 MG PO TABS
25.0000 mg | ORAL_TABLET | Freq: Every day | ORAL | Status: DC
  Administered 2017-04-09 – 2017-04-11 (×3): 25 mg via ORAL
  Filled 2017-04-09 (×3): qty 1

## 2017-04-09 MED ORDER — METOPROLOL SUCCINATE ER 50 MG PO TB24
50.0000 mg | ORAL_TABLET | Freq: Every day | ORAL | Status: DC
  Administered 2017-04-10 – 2017-04-11 (×2): 50 mg via ORAL
  Filled 2017-04-09 (×3): qty 1

## 2017-04-09 MED ORDER — LOPERAMIDE HCL 2 MG PO CAPS
2.0000 mg | ORAL_CAPSULE | ORAL | Status: DC | PRN
  Administered 2017-04-09 – 2017-04-10 (×6): 2 mg via ORAL
  Filled 2017-04-09 (×5): qty 1

## 2017-04-09 NOTE — Progress Notes (Addendum)
PROGRESS NOTE  Sierra [name]  OMV:[id number] DOB: 11-20-1945 DOA: 04/06/2017 PCP: Lavone Orn, MD  Outpatient Specialists: Cardiology, varanasi.  Brief Narrative: Sierra [name] is a 71 y.o. female with a history of left ureteral stone s/p technically difficult/unsuccessful left ureteroscopy 10/15 who presented to the ED 10/17 with abrupt, severe left flank pain found to have a fever to 103F and rapid AFib concerning for sepsis. Vanc/cefepime were started after cultures drawn. Blood cultures grew Klebsiella pneumoniae. She has defervesced with targeted antibiotics. Urology consulted, recommending elective antegrade ureteroscopy as outpatient after antibiotics. Heart rate has improved but rhythm remains AFib.   Assessment & Plan: Principal Problem:   Sepsis ([place]) Active Problems:   PAF (paroxysmal atrial fibrillation) (HCC)   Essential hypertension   Complicated UTI (urinary tract infection)   Depression   HLD (hyperlipidemia)   Arthritis   Bacteremia due to Klebsiella pneumoniae  Sepsis due to Klebsiella bacteremia: Sepsis resolving. Initial lactic acid 4.3,now down to 1.6.  - Continue ceftriaxone. - No vegetations on echo. - Discussed with ID, Dr. Johnnye Sima. Will need 5 days of IV antibiotics followed by a total of 2 weeks po abx. Would put DC date 10/22 on keflex.  Left ureteral stone: With difficult anatomy not allowing retrograde ureteroscopy. Renal U/S without hydronephrosis.  - Per urology, will consider elective antegrade ureteroscopy after antibiotics. Would need to be off anticoagulation for that.  Paroxysmal atrial fibrillation with rapid ventricular response: Ventricular rate now controlled. Cardiology, Dr. Irish Lack, previously discussed with pt who was hesitant about anticoagulation and had only a single episode.  - CHA2DS2-VASc is 3, now having ongoing AFib with dilated LA on echo, puts stroke risk at 3.2%/yr. HAS-BLED score is 2 (HTN, age) indicating 1.88% bleeding  rate/yr on anticoagulation.  - Discussed with Dr. Caryl Comes 10/21 who confirms need for anticoagulation. After long discusion regarding coumadin vs. newer agent, pt and husband prefer to start coumadin. We will start this and arrange for INR checks prior to discharge tomorrow. - Continue telemetry - Continue metoprolol at home dose, rate controlled.   Elevated BNP: No hypoxia or clinical evidence of volume overload at this time, suspect related to AFib. Echocardiogram with blunted IVC respirophasic response.  - Restarted HCTZ. No IVF's.   Essential HTN:  - Holding losartan due to sepsis with soft BPs.  - Continue metoprolol, HCTZ  History of cholecystectomy:  - Continue colestid (generic formulation has not been effective, per family)  Depression: Chronic, stable - Decrease dose celexa to 20mg  daily due to age and risk of QTc.   Constipation: Resolved with mag citrate.  - Now requiring intermittent imodium. Encouraged to stop all pro and anti motility agents at this time.   DVT prophylaxis: SCDs Code Status: Full Family Communication: None at bedside this AM Disposition Plan: [place] home 10/22.  Consultants:   Urology  ID, Dr. Johnnye Sima by phone 10/19.  Cardiology, Dr. Caryl Comes 10/21.  Procedures:   Echocardiogram 04/09/2017 - Left ventricle: The cavity size was normal. There was mild   concentric hypertrophy. Systolic function was normal. The   estimated ejection fraction was in the range of 55% to 60%. Wall   motion was normal; there were no regional wall motion   abnormalities. - Aortic valve: Transvalvular velocity was within the normal range.   There was no stenosis. There was no regurgitation. - Mitral valve: Transvalvular velocity was within the normal range.   There was no evidence for stenosis. There was mild regurgitation. - Left atrium: The  atrium was severely dilated. - Right ventricle: The cavity size was normal. Wall thickness was   normal. Systolic  function was normal. - Right atrium: The atrium was moderately dilated. - Tricuspid valve: There was mild regurgitation. - Pulmonary arteries: Systolic pressure was mildly increased. PA   peak pressure: 48 mm Hg (S).  Antimicrobials:  Vanc/cefepime > ceftriaxone   Subjective: Now having loose stools, improved with imodium. No bleeding. No chest pain or dyspnea or orthopnea or LE swelling. Very resistant to idea of blood thinner for stroke prevention. No fevers.  Objective: Vitals:   04/08/17 1423 04/08/17 2124 04/09/17 0510 04/09/17 1528  BP: (!) 119/57 133/70 137/60 (!) 137/56  Pulse: 89 85 83 74  Resp: 17 18 18 18   Temp: 98.6 F (37 C) 99.2 F (37.3 C) 97.9 F (36.6 C) 98.9 F (37.2 C)  TempSrc: Oral Oral Oral Oral  SpO2: 95% 95% 94% 96%  Weight:      Height:        Intake/Output Summary (Last 24 hours) at 04/09/17 1648 Last data filed at 04/09/17 1123  Gross per 24 hour  Intake              120 ml  Output                0 ml  Net              120 ml   Filed Weights   04/07/17 0441 04/07/17 1656  Weight: 86.6 kg (191 lb) 93.1 kg (205 lb 4 oz)    Gen: 71 y.o. female in no distress sitting in bed. Pulm: Non-labored breathing room air. Clear to auscultation bilaterally.  CV: Regular rate and rhythm. No murmur, rub, or gallop. No JVD, no pedal edema. GI: Abdomen soft, nontender, non-distended, with normoactive bowel sounds. No organomegaly or masses felt. Ext: Warm, no deformities Skin: No rashes, lesions no ulcers. bleeding/ecchymosis. Neuro: Alert and oriented. No focal neurological deficits. Psych: Judgement and insight appear normal. Mood & affect appropriate.   Data Reviewed: I have personally reviewed following labs and imaging studies  CBC:  Recent Labs Lab 04/06/17 2220 04/08/17 0516  WBC 3.1* 10.2  NEUTROABS 2.7  --   HGB 12.8 10.0*  HCT 37.2 29.9*  MCV 90.7 91.7  PLT 172 454*   Basic Metabolic Panel:  Recent Labs Lab 04/06/17 2220  04/08/17 0516  NA 138 137  K 3.6 3.5  CL 101 104  CO2 24 25  GLUCOSE 139* 118*  BUN 33* 28*  CREATININE 0.94 0.89  CALCIUM 9.3 7.7*  MG  --  1.8   GFR: Estimated Creatinine Clearance: 66.9 mL/min (by C-G formula based on SCr of 0.89 mg/dL). Liver Function Tests: No results for input(s): AST, ALT, ALKPHOS, BILITOT, PROT, ALBUMIN in the last 168 hours. No results for input(s): LIPASE, AMYLASE in the last 168 hours. No results for input(s): AMMONIA in the last 168 hours. Coagulation Profile:  Recent Labs Lab 04/07/17 0405  INR 1.01   Cardiac Enzymes: No results for input(s): CKTOTAL, CKMB, CKMBINDEX, TROPONINI in the last 168 hours. BNP (last 3 results) No results for input(s): PROBNP in the last 8760 hours. HbA1C: No results for input(s): HGBA1C in the last 72 hours. CBG:  Recent Labs Lab 04/07/17 0745 04/08/17 0742 04/09/17 0735  GLUCAP 128* 146* 137*   Lipid Profile: No results for input(s): CHOL, HDL, LDLCALC, TRIG, CHOLHDL, LDLDIRECT in the last 72 hours. Thyroid Function Tests: No results for input(s):  TSH, T4TOTAL, FREET4, T3FREE, THYROIDAB in the last 72 hours. Anemia Panel: No results for input(s): VITAMINB12, FOLATE, FERRITIN, TIBC, IRON, RETICCTPCT in the last 72 hours. Urine analysis:    Component Value Date/Time   COLORURINE YELLOW 04/06/2017 [place] 04/06/2017 2218   LABSPEC 1.014 04/06/2017 2218   PHURINE 5.0 04/06/2017 2218   GLUCOSEU NEGATIVE 04/06/2017 2218   HGBUR MODERATE (A) 04/06/2017 2218   BILIRUBINUR NEGATIVE 04/06/2017 2218   KETONESUR NEGATIVE 04/06/2017 2218   PROTEINUR NEGATIVE 04/06/2017 2218   UROBILINOGEN 0.2 09/22/2013 0119   NITRITE NEGATIVE 04/06/2017 2218   LEUKOCYTESUR MODERATE (A) 04/06/2017 2218   Recent Results (from the past 240 hour(s))  Urine C&S     Status: Abnormal   Collection Time: 04/06/17 10:20 PM  Result Value Ref Range Status   Specimen Description URINE, CLEAN CATCH  Final   Special  Requests NONE  Final   Culture >=100,000 COLONIES/mL KLEBSIELLA PNEUMONIAE (A)  Final   Report Status 04/09/2017 FINAL  Final   Organism ID, Bacteria KLEBSIELLA PNEUMONIAE (A)  Final      Susceptibility   Klebsiella pneumoniae - MIC*    AMPICILLIN >=32 RESISTANT Resistant     CEFAZOLIN <=4 SENSITIVE Sensitive     CEFTRIAXONE <=1 SENSITIVE Sensitive     CIPROFLOXACIN <=0.25 SENSITIVE Sensitive     GENTAMICIN <=1 SENSITIVE Sensitive     IMIPENEM <=0.25 SENSITIVE Sensitive     NITROFURANTOIN 128 RESISTANT Resistant     TRIMETH/SULFA <=20 SENSITIVE Sensitive     AMPICILLIN/SULBACTAM 8 SENSITIVE Sensitive     PIP/TAZO <=4 SENSITIVE Sensitive     Extended ESBL NEGATIVE Sensitive     * >=100,000 COLONIES/mL KLEBSIELLA PNEUMONIAE  Blood culture (routine x 2)     Status: Abnormal   Collection Time: 04/06/17 11:34 PM  Result Value Ref Range Status   Specimen Description BLOOD RIGHT ANTECUBITAL  Final   Special Requests   Final    BOTTLES DRAWN AEROBIC AND ANAEROBIC Blood Culture adequate volume   Culture  Setup Time   Final    GRAM NEGATIVE RODS IN BOTH AEROBIC AND ANAEROBIC BOTTLES CRITICAL RESULT CALLED TO, READ BACK BY AND VERIFIED WITH: D. Zeigler Pharm.D. 17:20 04/07/17 ([email or web address])    Culture KLEBSIELLA PNEUMONIAE (A)  Final   Report Status 04/09/2017 FINAL  Final   Organism ID, Bacteria KLEBSIELLA PNEUMONIAE  Final      Susceptibility   Klebsiella pneumoniae - MIC*    AMPICILLIN >=32 RESISTANT Resistant     CEFAZOLIN <=4 SENSITIVE Sensitive     CEFEPIME <=1 SENSITIVE Sensitive     CEFTAZIDIME <=1 SENSITIVE Sensitive     CEFTRIAXONE <=1 SENSITIVE Sensitive     CIPROFLOXACIN <=0.25 SENSITIVE Sensitive     GENTAMICIN <=1 SENSITIVE Sensitive     IMIPENEM <=0.25 SENSITIVE Sensitive     TRIMETH/SULFA <=20 SENSITIVE Sensitive     AMPICILLIN/SULBACTAM 8 SENSITIVE Sensitive     PIP/TAZO <=4 SENSITIVE Sensitive     Extended ESBL NEGATIVE Sensitive     * KLEBSIELLA PNEUMONIAE  Blood  Culture ID Panel (Reflexed)     Status: Abnormal   Collection Time: 04/06/17 11:34 PM  Result Value Ref Range Status   Enterococcus species NOT DETECTED NOT DETECTED Final   Listeria monocytogenes NOT DETECTED NOT DETECTED Final   Staphylococcus species NOT DETECTED NOT DETECTED Final   Staphylococcus aureus NOT DETECTED NOT DETECTED Final   Streptococcus species NOT DETECTED NOT DETECTED Final   Streptococcus agalactiae NOT DETECTED  NOT DETECTED Final   Streptococcus pneumoniae NOT DETECTED NOT DETECTED Final   Streptococcus pyogenes NOT DETECTED NOT DETECTED Final   Acinetobacter baumannii NOT DETECTED NOT DETECTED Final   Enterobacteriaceae species DETECTED (A) NOT DETECTED Final    Comment: Enterobacteriaceae represent a large family of gram-negative bacteria, not a single organism. CRITICAL RESULT CALLED TO, READ BACK BY AND VERIFIED WITH: D. Zeigler Pharm.D. 17:20 04/07/17 ([email or web address])    Enterobacter cloacae complex NOT DETECTED NOT DETECTED Final   Escherichia coli NOT DETECTED NOT DETECTED Final   Klebsiella oxytoca NOT DETECTED NOT DETECTED Final   Klebsiella pneumoniae DETECTED (A) NOT DETECTED Final    Comment: CRITICAL RESULT CALLED TO, READ BACK BY AND VERIFIED WITH: D. Zeigler Pharm.D. 17:20 04/07/17 ([email or web address])    Proteus species NOT DETECTED NOT DETECTED Final   Serratia marcescens NOT DETECTED NOT DETECTED Final   Carbapenem resistance NOT DETECTED NOT DETECTED Final   Haemophilus influenzae NOT DETECTED NOT DETECTED Final   Neisseria meningitidis NOT DETECTED NOT DETECTED Final   Pseudomonas aeruginosa NOT DETECTED NOT DETECTED Final   Candida albicans NOT DETECTED NOT DETECTED Final   Candida glabrata NOT DETECTED NOT DETECTED Final   Candida krusei NOT DETECTED NOT DETECTED Final   Candida parapsilosis NOT DETECTED NOT DETECTED Final   Candida tropicalis NOT DETECTED NOT DETECTED Final  Blood culture (routine x 2)     Status: Abnormal   Collection Time: 04/06/17  11:39 PM  Result Value Ref Range Status   Specimen Description BLOOD LEFT ANTECUBITAL  Final   Special Requests   Final    BOTTLES DRAWN AEROBIC AND ANAEROBIC Blood Culture adequate volume   Culture  Setup Time   Final    GRAM NEGATIVE RODS IN BOTH AEROBIC AND ANAEROBIC BOTTLES CRITICAL VALUE NOTED.  VALUE IS CONSISTENT WITH PREVIOUSLY REPORTED AND CALLED VALUE.    Culture (A)  Final    KLEBSIELLA PNEUMONIAE SUSCEPTIBILITIES PERFORMED ON PREVIOUS CULTURE WITHIN THE LAST 5 DAYS. Performed at [facility], [place] [address], [place],  [address]    Report Status 04/09/2017 FINAL  Final      Radiology Studies: No results found.  Scheduled Meds: . acidophilus  1 capsule Oral Daily  . citalopram  20 mg Oral Daily  . colestipol  5 g Oral BID  . cyanocobalamin  500 mcg Oral Q M,W,F  . loratadine  10 mg Oral Daily  . magnesium citrate  1 Bottle Oral Daily  . metoprolol succinate  100 mg Oral Daily  . multivitamin with minerals  1 tablet Oral Daily  . nabumetone  500 mg Oral BID  . pantoprazole  40 mg Oral Daily  . terazosin  10 mg Oral QHS  . vitamin C  250 mg Oral Daily   Continuous Infusions: . cefTRIAXone (ROCEPHIN)  IV Stopped (04/08/17 2230)     LOS: 2 days   Time spent: 25 minutes.  Vance Gather, MD [facility] Pager [phone]  If 7PM-7AM, please contact night-coverage [email or web address] Password [email or web address] 04/09/2017, 4:48 PM

## 2017-04-09 NOTE — Progress Notes (Signed)
  Echocardiogram 2D Echocardiogram has been performed.  [name] F 04/09/2017, 10:11 AM

## 2017-04-09 NOTE — Progress Notes (Signed)
Urology Inpatient Progress Report  Arthritis [K24.09] Complicated urinary tract infection [N39.0] Hypoxia [R09.02] Bibasilar crackles [R09.89] Sepsis ([place]) [B35.3] Complicated UTI (urinary tract infection) [N39.0]   Intv/Subj: No acute events overnight. Patient is without complaint. Afebrile, feeling better  Principal Problem:   Sepsis ([place]) Active Problems:   PAF (paroxysmal atrial fibrillation) (HCC)   Essential hypertension   Complicated UTI (urinary tract infection)   Depression   HLD (hyperlipidemia)   Arthritis   Bacteremia due to Klebsiella pneumoniae  Current Facility-Administered Medications  Medication Dose Route Frequency Provider Last Rate Last Dose  . acetaminophen (TYLENOL) tablet 650 mg  650 mg Oral Q6H PRN Ivor Costa, MD      . acidophilus (RISAQUAD) capsule 1 capsule  1 capsule Oral Daily Ivor Costa, MD   1 capsule at 04/08/17 0948  . cefTRIAXone (ROCEPHIN) 2 g in dextrose 5 % 50 mL IVPB  2 g Intravenous Q24H Patrecia Pour, MD   Stopped at 04/08/17 2230  . citalopram (CELEXA) tablet 20 mg  20 mg Oral Daily Vance Gather B, MD      . colestipol (COLESTID) packet 5 g  5 g Oral BID Ivor Costa, MD   5 g at 04/08/17 2225  . cyanocobalamin tablet 500 mcg  500 mcg Oral Q M,W,F Ivor Costa, MD   500 mcg at 04/08/17 0948  . hydrALAZINE (APRESOLINE) injection 5 mg  5 mg Intravenous Q2H PRN Ivor Costa, MD      . loratadine (CLARITIN) tablet 10 mg  10 mg Oral Daily Ivor Costa, MD   10 mg at 04/08/17 0956  . magnesium citrate solution 1 Bottle  1 Bottle Oral Daily Ivor Costa, MD   1 Bottle at 04/08/17 [phone]  . metoprolol succinate (TOPROL-XL) 24 hr tablet 100 mg  100 mg Oral Daily Patrecia Pour, MD   100 mg at 04/08/17 0949  . morphine 4 MG/ML injection 2 mg  2 mg Intravenous Q4H PRN Ivor Costa, MD      . multivitamin with minerals tablet 1 tablet  1 tablet Oral Daily Ivor Costa, MD   1 tablet at 04/08/17 0949  . nabumetone (RELAFEN) tablet 500 mg  500 mg Oral BID Ivor Costa,  MD   500 mg at 04/08/17 2124  . ondansetron (ZOFRAN) injection 4 mg  4 mg Intravenous Q8H PRN Ivor Costa, MD      . oxyCODONE-acetaminophen (PERCOCET/ROXICET) 5-325 MG per tablet 1 tablet  1 tablet Oral Q4H PRN Ivor Costa, MD   1 tablet at 04/07/17 1959  . pantoprazole (PROTONIX) EC tablet 40 mg  40 mg Oral Daily Ivor Costa, MD   40 mg at 04/08/17 0949  . terazosin (HYTRIN) capsule 10 mg  10 mg Oral QHS Ivor Costa, MD   10 mg at 04/08/17 2124  . vitamin C (ASCORBIC ACID) tablet 250 mg  250 mg Oral Daily Ivor Costa, MD   250 mg at 04/08/17 0949  . zolpidem (AMBIEN) tablet 5 mg  5 mg Oral QHS PRN Ivor Costa, MD         Objective: Vital: Vitals:   04/08/17 0533 04/08/17 1423 04/08/17 2124 04/09/17 0510  BP: (!) 151/64 (!) 119/57 133/70 137/60  Pulse: 94 89 85 83  Resp: 16 17 18 18   Temp: 99.7 F (37.6 C) 98.6 F (37 C) 99.2 F (37.3 C) 97.9 F (36.6 C)  TempSrc: Oral Oral Oral Oral  SpO2: 92% 95% 95% 94%  Weight:      Height:  I/Os: I/O last 3 completed shifts: In: 32 [P.O.:490] Out: 100 [Urine:100]  Physical Exam:  General: Patient is in no apparent distress Lungs: Normal respiratory effort, chest expands symmetrically. GI: The abdomen is soft and nontender without mass. Ext: lower extremities symmetric  Lab Results:  Recent Labs  04/06/17 2220 04/08/17 0516  WBC 3.1* 10.2  HGB 12.8 10.0*  HCT 37.2 29.9*    Recent Labs  04/06/17 2220 04/08/17 0516  NA 138 137  K 3.6 3.5  CL 101 104  CO2 24 25  GLUCOSE 139* 118*  BUN 33* 28*  CREATININE 0.94 0.89  CALCIUM 9.3 7.7*    Recent Labs  04/07/17 0405  INR 1.01   No results for input(s): LABURIN in the last 72 hours. Results for orders placed or performed during the hospital encounter of 04/06/17  Blood culture (routine x 2)     Status: Abnormal (Preliminary result)   Collection Time: 04/06/17 11:34 PM  Result Value Ref Range Status   Specimen Description BLOOD RIGHT ANTECUBITAL  Final   Special  Requests   Final    BOTTLES DRAWN AEROBIC AND ANAEROBIC Blood Culture adequate volume   Culture  Setup Time   Final    GRAM NEGATIVE RODS IN BOTH AEROBIC AND ANAEROBIC BOTTLES CRITICAL RESULT CALLED TO, READ BACK BY AND VERIFIED WITH: D. Zeigler Pharm.D. 17:20 04/07/17 ([email or web address])    Culture (A)  Final    KLEBSIELLA PNEUMONIAE SUSCEPTIBILITIES TO FOLLOW Performed at [facility], [place] [address] [place], [place] [address]    Report Status PENDING  Incomplete  Blood Culture ID Panel (Reflexed)     Status: Abnormal   Collection Time: 04/06/17 11:34 PM  Result Value Ref Range Status   Enterococcus species NOT DETECTED NOT DETECTED Final   Listeria monocytogenes NOT DETECTED NOT DETECTED Final   Staphylococcus species NOT DETECTED NOT DETECTED Final   Staphylococcus aureus NOT DETECTED NOT DETECTED Final   Streptococcus species NOT DETECTED NOT DETECTED Final   Streptococcus agalactiae NOT DETECTED NOT DETECTED Final   Streptococcus pneumoniae NOT DETECTED NOT DETECTED Final   Streptococcus pyogenes NOT DETECTED NOT DETECTED Final   Acinetobacter baumannii NOT DETECTED NOT DETECTED Final   Enterobacteriaceae species DETECTED (A) NOT DETECTED Final    Comment: Enterobacteriaceae represent a large family of gram-negative bacteria, not a single organism. CRITICAL RESULT CALLED TO, READ BACK BY AND VERIFIED WITH: D. Zeigler Pharm.D. 17:20 04/07/17 ([email or web address])    Enterobacter cloacae complex NOT DETECTED NOT DETECTED Final   Escherichia coli NOT DETECTED NOT DETECTED Final   Klebsiella oxytoca NOT DETECTED NOT DETECTED Final   Klebsiella pneumoniae DETECTED (A) NOT DETECTED Final    Comment: CRITICAL RESULT CALLED TO, READ BACK BY AND VERIFIED WITH: D. Zeigler Pharm.D. 17:20 04/07/17 ([email or web address])    Proteus species NOT DETECTED NOT DETECTED Final   Serratia marcescens NOT DETECTED NOT DETECTED Final   Carbapenem resistance NOT DETECTED NOT DETECTED Final   Haemophilus influenzae NOT  DETECTED NOT DETECTED Final   Neisseria meningitidis NOT DETECTED NOT DETECTED Final   Pseudomonas aeruginosa NOT DETECTED NOT DETECTED Final   Candida albicans NOT DETECTED NOT DETECTED Final   Candida glabrata NOT DETECTED NOT DETECTED Final   Candida krusei NOT DETECTED NOT DETECTED Final   Candida parapsilosis NOT DETECTED NOT DETECTED Final   Candida tropicalis NOT DETECTED NOT DETECTED Final  Blood culture (routine x 2)     Status: Abnormal (Preliminary result)   Collection Time: 04/06/17 11:39 PM  Result Value Ref Range Status   Specimen Description BLOOD LEFT ANTECUBITAL  Final   Special Requests   Final    BOTTLES DRAWN AEROBIC AND ANAEROBIC Blood Culture adequate volume   Culture  Setup Time   Final    GRAM NEGATIVE RODS IN BOTH AEROBIC AND ANAEROBIC BOTTLES CRITICAL VALUE NOTED.  VALUE IS CONSISTENT WITH PREVIOUSLY REPORTED AND CALLED VALUE. Performed at [facility], [place] [address] [place], [place] [address]    Culture KLEBSIELLA PNEUMONIAE (A)  Final   Report Status PENDING  Incomplete    Studies/Results: Dg Chest 2 View  Result Date: 04/07/2017 CLINICAL DATA:  Left flank pain.  History of left ureteral stone. EXAM: CHEST  2 VIEW COMPARISON:  09/11/2013 FINDINGS: Cardiomegaly. No confluent opacities, effusions or edema. No acute bony abnormality. IMPRESSION: Cardiomegaly.  No active disease. Electronically Signed   By: Rolm Baptise M.D.   On: 04/07/2017 10:48    Assessment: Admitted with bacteremia presumably from recent cystoscopic procedure.  Growing enterobacter and klebsiella, sensitivities not yet back.  Feeling better.  Plan: Recommend transitioning her to oral antibiotics once sensitivities return.  She will need a two week course of abx to treat her bacteremia/pyelonephritis.  I encouraged her to get out of bed and walk around the halls while she is here.  We will plan to see her back in clinic in the coming weeks to discuss a follow-up procedure to  successfully extract her stone.    Louis Meckel, MD Urology 04/09/2017, 8:08 AM

## 2017-04-10 LAB — CBC
HCT: 30.7 % — ABNORMAL LOW (ref 36.0–46.0)
Hemoglobin: 10.4 g/dL — ABNORMAL LOW (ref 12.0–15.0)
MCH: 30.9 pg (ref 26.0–34.0)
MCHC: 33.9 g/dL (ref 30.0–36.0)
MCV: 91.1 fL (ref 78.0–100.0)
PLATELETS: 224 10*3/uL (ref 150–400)
RBC: 3.37 MIL/uL — ABNORMAL LOW (ref 3.87–5.11)
RDW: 13.9 % (ref 11.5–15.5)
WBC: 7.3 10*3/uL (ref 4.0–10.5)

## 2017-04-10 LAB — PROTIME-INR
INR: 1
Prothrombin Time: 13.1 seconds (ref 11.4–15.2)

## 2017-04-10 MED ORDER — WARFARIN - PHARMACIST DOSING INPATIENT
Freq: Every day | Status: DC
  Administered 2017-04-10: 18:00:00

## 2017-04-10 MED ORDER — PATIENT'S GUIDE TO USING COUMADIN BOOK
Freq: Once | Status: AC
  Administered 2017-04-10: 15:00:00
  Filled 2017-04-10 (×2): qty 1

## 2017-04-10 MED ORDER — WARFARIN SODIUM 5 MG PO TABS
7.5000 mg | ORAL_TABLET | Freq: Once | ORAL | Status: AC
  Administered 2017-04-10: 7.5 mg via ORAL
  Filled 2017-04-10: qty 1

## 2017-04-10 NOTE — Progress Notes (Addendum)
ANTICOAGULATION CONSULT NOTE - Initial Consult  Pharmacy Consult for Coumadin Indication: atrial fibrillation  Allergies  Allergen Reactions  . Ace Inhibitors Cough  . Codeine Nausea Only  . Erythromycin Other (See Comments)    Stomach pain  . Iohexol Hives     Code: HIVES, Desc: patient states develops hives w/ iv contrast/mms    Patient Measurements: Height: 5\' 7"  (170.2 cm) Weight: 205 lb 4 oz (93.1 kg) IBW/kg (Calculated) : 61.6  Vital Signs: Temp: 98.5 F (36.9 C) (10/21 1402) Temp Source: Oral (10/21 1402) BP: 149/76 (10/21 1402) Pulse Rate: 87 (10/21 1402)  Labs:  Recent Labs  04/08/17 0516 04/10/17 1452  HGB 10.0* 10.4*  HCT 29.9* 30.7*  PLT 141* 224  LABPROT  --  13.1  INR  --  1.00  CREATININE 0.89  --     Estimated Creatinine Clearance: 66.9 mL/min (by C-G formula based on SCr of 0.89 mg/dL).   Medical History: Past Medical History:  Diagnosis Date  . Anxiety   . Arthritis    "joints" (09/19/2013)  . Atrial fibrillation ([place])   . Basal cell carcinoma of cheek    left  . Chronic lower back pain   . GERD (gastroesophageal reflux disease)   . H/O hiatal hernia   . Hypertension   . Migraine    "haven't had any in a long time" (09/19/2013)  . Pneumonia ~ 2012    Medications:  Scheduled:  . acidophilus  1 capsule Oral Daily  . citalopram  20 mg Oral Daily  . colestipol  5 g Oral BID  . cyanocobalamin  500 mcg Oral Q M,W,F  . hydrochlorothiazide  25 mg Oral Daily  . loratadine  10 mg Oral Daily  . magnesium citrate  1 Bottle Oral Daily  . metoprolol succinate  50 mg Oral Daily  . multivitamin with minerals  1 tablet Oral Daily  . nabumetone  500 mg Oral BID  . pantoprazole  40 mg Oral Daily  . patient's guide to using coumadin book   Does not apply Once  . terazosin  10 mg Oral QHS  . vitamin C  250 mg Oral Daily    Assessment: 71 yo F admitted 10/18, 2 days s/p cystoscopy, with fever, chills, and tachycardia. Broad spectrum abx per  pharmacy. In A.fib, no anticoagulation so far this admission. Decision is made to start anticoagulation with warfarin on 10/21. CHA2DS2-VASc is 3, HAS-BLED score is 2.   Baseline PT/INR: wnl CBC: ok Diet: HH, tolerating DDI: nabumetone increases chance of gastritis/GIB. Colestipol can bind warfarin and prevent absorption.  Goal of Therapy:  INR 2-3 Monitor platelets by anticoagulation protocol: Yes   Plan:  Start with Coumadin 7.5 mg tonight. Check PT/INR daily. Provided Coumadin education. The patient was on Coumadin in the past following orthopedic surgery. I advised her to take Coumadin at least 1 hour before colestipol. I advised to her talk with her doctor about continuing nabumetone or switching to an alternative therapy.   Romeo Rabon, PharmD. Mobile: [phone]. 04/10/2017,4:46 PM.

## 2017-04-10 NOTE — Progress Notes (Signed)
Urology Inpatient Progress Report  Arthritis [U44.03] Complicated urinary tract infection [N39.0] Hypoxia [R09.02] Bibasilar crackles [R09.89] Sepsis ([place]) [K74.2] Complicated UTI (urinary tract infection) [N39.0]   Intv/Subj: No acute events overnight. Patient is without complaint.   Principal Problem:   Sepsis ([place]) Active Problems:   PAF (paroxysmal atrial fibrillation) (HCC)   Essential hypertension   Complicated UTI (urinary tract infection)   Depression   HLD (hyperlipidemia)   Arthritis   Bacteremia due to Klebsiella pneumoniae  Current Facility-Administered Medications  Medication Dose Route Frequency Provider Last Rate Last Dose  . acetaminophen (TYLENOL) tablet 650 mg  650 mg Oral Q6H PRN Ivor Costa, MD      . acidophilus (RISAQUAD) capsule 1 capsule  1 capsule Oral Daily Ivor Costa, MD   1 capsule at 04/10/17 5956  . cefTRIAXone (ROCEPHIN) 2 g in dextrose 5 % 50 mL IVPB  2 g Intravenous Q24H Patrecia Pour, MD   Stopped at 04/09/17 2139  . citalopram (CELEXA) tablet 20 mg  20 mg Oral Daily Patrecia Pour, MD   20 mg at 04/10/17 0919  . colestipol (COLESTID) packet 5 g  5 g Oral BID Ivor Costa, MD   5 g at 04/10/17 0920  . cyanocobalamin tablet 500 mcg  500 mcg Oral Q M,W,F Ivor Costa, MD   500 mcg at 04/08/17 0948  . hydrALAZINE (APRESOLINE) injection 5 mg  5 mg Intravenous Q2H PRN Ivor Costa, MD      . hydrochlorothiazide (HYDRODIURIL) tablet 25 mg  25 mg Oral Daily Patrecia Pour, MD   25 mg at 04/10/17 0919  . loperamide (IMODIUM) capsule 2 mg  2 mg Oral PRN Patrecia Pour, MD   2 mg at 04/10/17 0919  . loratadine (CLARITIN) tablet 10 mg  10 mg Oral Daily Ivor Costa, MD   10 mg at 04/10/17 0919  . magnesium citrate solution 1 Bottle  1 Bottle Oral Daily Ivor Costa, MD   1 Bottle at 04/09/17 0845  . metoprolol succinate (TOPROL-XL) 24 hr tablet 50 mg  50 mg Oral Daily Patrecia Pour, MD   50 mg at 04/10/17 0918  . morphine 4 MG/ML injection 2 mg  2 mg Intravenous Q4H  PRN Ivor Costa, MD      . multivitamin with minerals tablet 1 tablet  1 tablet Oral Daily Ivor Costa, MD   1 tablet at 04/10/17 0919  . nabumetone (RELAFEN) tablet 500 mg  500 mg Oral BID Ivor Costa, MD   500 mg at 04/10/17 0919  . ondansetron (ZOFRAN) injection 4 mg  4 mg Intravenous Q8H PRN Ivor Costa, MD      . oxyCODONE-acetaminophen (PERCOCET/ROXICET) 5-325 MG per tablet 1 tablet  1 tablet Oral Q4H PRN Ivor Costa, MD   1 tablet at 04/07/17 1959  . pantoprazole (PROTONIX) EC tablet 40 mg  40 mg Oral Daily Ivor Costa, MD   40 mg at 04/10/17 0919  . terazosin (HYTRIN) capsule 10 mg  10 mg Oral QHS Ivor Costa, MD   10 mg at 04/09/17 2109  . vitamin C (ASCORBIC ACID) tablet 250 mg  250 mg Oral Daily Ivor Costa, MD   250 mg at 04/10/17 0919  . zolpidem (AMBIEN) tablet 5 mg  5 mg Oral QHS PRN Ivor Costa, MD         Objective: Vital: Vitals:   04/09/17 0510 04/09/17 1528 04/09/17 2016 04/10/17 0408  BP: 137/60 (!) 137/56 (!) 145/66 (!) 140/55  Pulse: 83  74 80 99  Resp: 18 18 18 18   Temp: 97.9 F (36.6 C) 98.9 F (37.2 C) 98.2 F (36.8 C) 98.2 F (36.8 C)  TempSrc: Oral Oral Oral Oral  SpO2: 94% 96% 96% 99%  Weight:      Height:       I/Os: I/O last 3 completed shifts: In: 120 [P.O.:120] Out: -   Physical Exam:  General: Patient is in no apparent distress Lungs: Normal respiratory effort, chest expands symmetrically. GI: The abdomen is soft and nontender without mass. Ext: lower extremities symmetric  Lab Results:  Recent Labs  04/08/17 0516  WBC 10.2  HGB 10.0*  HCT 29.9*    Recent Labs  04/08/17 0516  NA 137  K 3.5  CL 104  CO2 25  GLUCOSE 118*  BUN 28*  CREATININE 0.89  CALCIUM 7.7*   No results for input(s): LABPT, INR in the last 72 hours. No results for input(s): LABURIN in the last 72 hours. Results for orders placed or performed during the hospital encounter of 04/06/17  Urine C&S     Status: Abnormal   Collection Time: 04/06/17 10:20 PM  Result  Value Ref Range Status   Specimen Description URINE, CLEAN CATCH  Final   Special Requests NONE  Final   Culture >=100,000 COLONIES/mL KLEBSIELLA PNEUMONIAE (A)  Final   Report Status 04/09/2017 FINAL  Final   Organism ID, Bacteria KLEBSIELLA PNEUMONIAE (A)  Final      Susceptibility   Klebsiella pneumoniae - MIC*    AMPICILLIN >=32 RESISTANT Resistant     CEFAZOLIN <=4 SENSITIVE Sensitive     CEFTRIAXONE <=1 SENSITIVE Sensitive     CIPROFLOXACIN <=0.25 SENSITIVE Sensitive     GENTAMICIN <=1 SENSITIVE Sensitive     IMIPENEM <=0.25 SENSITIVE Sensitive     NITROFURANTOIN 128 RESISTANT Resistant     TRIMETH/SULFA <=20 SENSITIVE Sensitive     AMPICILLIN/SULBACTAM 8 SENSITIVE Sensitive     PIP/TAZO <=4 SENSITIVE Sensitive     Extended ESBL NEGATIVE Sensitive     * >=100,000 COLONIES/mL KLEBSIELLA PNEUMONIAE  Blood culture (routine x 2)     Status: Abnormal   Collection Time: 04/06/17 11:34 PM  Result Value Ref Range Status   Specimen Description BLOOD RIGHT ANTECUBITAL  Final   Special Requests   Final    BOTTLES DRAWN AEROBIC AND ANAEROBIC Blood Culture adequate volume   Culture  Setup Time   Final    GRAM NEGATIVE RODS IN BOTH AEROBIC AND ANAEROBIC BOTTLES CRITICAL RESULT CALLED TO, READ BACK BY AND VERIFIED WITH: D. Zeigler Pharm.D. 17:20 04/07/17 ([email or web address])    Culture KLEBSIELLA PNEUMONIAE (A)  Final   Report Status 04/09/2017 FINAL  Final   Organism ID, Bacteria KLEBSIELLA PNEUMONIAE  Final      Susceptibility   Klebsiella pneumoniae - MIC*    AMPICILLIN >=32 RESISTANT Resistant     CEFAZOLIN <=4 SENSITIVE Sensitive     CEFEPIME <=1 SENSITIVE Sensitive     CEFTAZIDIME <=1 SENSITIVE Sensitive     CEFTRIAXONE <=1 SENSITIVE Sensitive     CIPROFLOXACIN <=0.25 SENSITIVE Sensitive     GENTAMICIN <=1 SENSITIVE Sensitive     IMIPENEM <=0.25 SENSITIVE Sensitive     TRIMETH/SULFA <=20 SENSITIVE Sensitive     AMPICILLIN/SULBACTAM 8 SENSITIVE Sensitive     PIP/TAZO <=4 SENSITIVE  Sensitive     Extended ESBL NEGATIVE Sensitive     * KLEBSIELLA PNEUMONIAE  Blood Culture ID Panel (Reflexed)     Status: Abnormal  Collection Time: 04/06/17 11:34 PM  Result Value Ref Range Status   Enterococcus species NOT DETECTED NOT DETECTED Final   Listeria monocytogenes NOT DETECTED NOT DETECTED Final   Staphylococcus species NOT DETECTED NOT DETECTED Final   Staphylococcus aureus NOT DETECTED NOT DETECTED Final   Streptococcus species NOT DETECTED NOT DETECTED Final   Streptococcus agalactiae NOT DETECTED NOT DETECTED Final   Streptococcus pneumoniae NOT DETECTED NOT DETECTED Final   Streptococcus pyogenes NOT DETECTED NOT DETECTED Final   Acinetobacter baumannii NOT DETECTED NOT DETECTED Final   Enterobacteriaceae species DETECTED (A) NOT DETECTED Final    Comment: Enterobacteriaceae represent a large family of gram-negative bacteria, not a single organism. CRITICAL RESULT CALLED TO, READ BACK BY AND VERIFIED WITH: D. Zeigler Pharm.D. 17:20 04/07/17 ([email or web address])    Enterobacter cloacae complex NOT DETECTED NOT DETECTED Final   Escherichia coli NOT DETECTED NOT DETECTED Final   Klebsiella oxytoca NOT DETECTED NOT DETECTED Final   Klebsiella pneumoniae DETECTED (A) NOT DETECTED Final    Comment: CRITICAL RESULT CALLED TO, READ BACK BY AND VERIFIED WITH: D. Zeigler Pharm.D. 17:20 04/07/17 ([email or web address])    Proteus species NOT DETECTED NOT DETECTED Final   Serratia marcescens NOT DETECTED NOT DETECTED Final   Carbapenem resistance NOT DETECTED NOT DETECTED Final   Haemophilus influenzae NOT DETECTED NOT DETECTED Final   Neisseria meningitidis NOT DETECTED NOT DETECTED Final   Pseudomonas aeruginosa NOT DETECTED NOT DETECTED Final   Candida albicans NOT DETECTED NOT DETECTED Final   Candida glabrata NOT DETECTED NOT DETECTED Final   Candida krusei NOT DETECTED NOT DETECTED Final   Candida parapsilosis NOT DETECTED NOT DETECTED Final   Candida tropicalis NOT DETECTED NOT DETECTED  Final  Blood culture (routine x 2)     Status: Abnormal   Collection Time: 04/06/17 11:39 PM  Result Value Ref Range Status   Specimen Description BLOOD LEFT ANTECUBITAL  Final   Special Requests   Final    BOTTLES DRAWN AEROBIC AND ANAEROBIC Blood Culture adequate volume   Culture  Setup Time   Final    GRAM NEGATIVE RODS IN BOTH AEROBIC AND ANAEROBIC BOTTLES CRITICAL VALUE NOTED.  VALUE IS CONSISTENT WITH PREVIOUSLY REPORTED AND CALLED VALUE.    Culture (A)  Final    KLEBSIELLA PNEUMONIAE SUSCEPTIBILITIES PERFORMED ON PREVIOUS CULTURE WITHIN THE LAST 5 DAYS. Performed at [facility], [place] [address], [place], [place] [address]    Report Status 04/09/2017 FINAL  Final    Studies/Results: No results found.  Assessment: Admitted with bacteremia presumably from recent cystoscopic procedure.  Growing enterobacter and klebsiella, with several oral options - Keflex chosen by ID.  Feeling better.  Plan: D/c tomorrow with 14 days of Keflex. F/u with Dr. Gloriann Loan as scheduled.   Louis Meckel, MD Urology 04/10/2017, 12:38 PM

## 2017-04-11 LAB — BASIC METABOLIC PANEL
Anion gap: 9 (ref 5–15)
BUN: 17 mg/dL (ref 6–20)
CHLORIDE: 105 mmol/L (ref 101–111)
CO2: 29 mmol/L (ref 22–32)
Calcium: 8.9 mg/dL (ref 8.9–10.3)
Creatinine, Ser: 0.88 mg/dL (ref 0.44–1.00)
Glucose, Bld: 114 mg/dL — ABNORMAL HIGH (ref 65–99)
POTASSIUM: 3.5 mmol/L (ref 3.5–5.1)
SODIUM: 143 mmol/L (ref 135–145)

## 2017-04-11 LAB — CBC
HCT: 29.3 % — ABNORMAL LOW (ref 36.0–46.0)
HEMOGLOBIN: 9.7 g/dL — AB (ref 12.0–15.0)
MCH: 30.2 pg (ref 26.0–34.0)
MCHC: 33.1 g/dL (ref 30.0–36.0)
MCV: 91.3 fL (ref 78.0–100.0)
PLATELETS: 215 10*3/uL (ref 150–400)
RBC: 3.21 MIL/uL — AB (ref 3.87–5.11)
RDW: 13.8 % (ref 11.5–15.5)
WBC: 6 10*3/uL (ref 4.0–10.5)

## 2017-04-11 LAB — PROTIME-INR
INR: 1.09
PROTHROMBIN TIME: 14 s (ref 11.4–15.2)

## 2017-04-11 MED ORDER — CEFPODOXIME PROXETIL 200 MG PO TABS: 200.0000 mg | ORAL_TABLET | Freq: Two times a day (BID) | ORAL | 0 refills | Status: AC

## 2017-04-11 MED ORDER — CEFPODOXIME PROXETIL 200 MG PO TABS
200.0000 mg | ORAL_TABLET | Freq: Two times a day (BID) | ORAL | Status: DC
  Administered 2017-04-11: 200 mg via ORAL
  Filled 2017-04-11: qty 1

## 2017-04-11 MED ORDER — METOPROLOL SUCCINATE ER 50 MG PO TB24
50.0000 mg | ORAL_TABLET | Freq: Once | ORAL | Status: AC
  Administered 2017-04-11: 50 mg via ORAL
  Filled 2017-04-11: qty 1

## 2017-04-11 MED ORDER — METOPROLOL SUCCINATE ER 50 MG PO TB24: 50.0000 mg | ORAL_TABLET | Freq: Once | ORAL | Status: DC

## 2017-04-11 MED ORDER — WARFARIN SODIUM 5 MG PO TABS: 5.0000 mg | ORAL_TABLET | Freq: Every day | ORAL | 0 refills | Status: DC

## 2017-04-11 MED ORDER — CEPHALEXIN 500 MG PO CAPS: 500.0000 mg | ORAL_CAPSULE | Freq: Four times a day (QID) | ORAL | Status: DC

## 2017-04-11 MED ORDER — CITALOPRAM HYDROBROMIDE 40 MG PO TABS: 20.0000 mg | ORAL_TABLET | Freq: Every day | ORAL | Status: DC

## 2017-04-11 MED ORDER — AMLODIPINE BESYLATE 10 MG PO TABS
10.0000 mg | ORAL_TABLET | Freq: Every day | ORAL | Status: DC
  Administered 2017-04-11: 10 mg via ORAL
  Filled 2017-04-11: qty 1

## 2017-04-11 MED ORDER — WARFARIN SODIUM 5 MG PO TABS
7.5000 mg | ORAL_TABLET | Freq: Once | ORAL | Status: AC
  Administered 2017-04-11: 13:00:00 7.5 mg via ORAL
  Filled 2017-04-11: qty 1

## 2017-04-11 NOTE — Progress Notes (Signed)
Pt's BP was 175/64, gave hydralazine. Rechecked BP and it was 176/70, paged on call and obtained order to give AM BP meds.

## 2017-04-11 NOTE — Discharge Summary (Signed)
Physician Discharge Summary  Sierra [name] JJK:[id number] DOB: [birth year] DOA: 04/06/2017  PCP: Lavone Orn, MD  Admit date: 04/06/2017 Discharge date: 04/11/2017  Admitted From: Home Disposition: Home   Recommendations for Outpatient Follow-up:  1. Follow up with cardiology clinic for INR check and hospital follow up appointment for AFib with Dr. Irish Lack as below. 2. Monitor HR, increased metoprolol succinate 50mg  > 100mg  3. Follow up with urology as scheduled for further left ureteral stone management. Please note that coumadin was started for AFib.  Home Health: None Equipment/Devices: None Discharge Condition: Stable CODE STATUS: Full Diet recommendation: Heart healthy  Brief/Interim Summary: Sierra [name] a 71 y.o.femalewith a history of left ureteral stone s/p technically difficult/unsuccessful left ureteroscopy 10/15 who presented to the ED 10/17 with abrupt, severe left flank pain found to have a fever to 103F and rapid AFib concerning for sepsis. Vanc/cefepime were started after cultures drawn. Blood cultures grew Klebsiella pneumoniae. She has defervesced with targeted antibiotics (ceftriaxone). Urology consulted, recommending elective antegrade ureteroscopy as outpatient after antibiotics. Heart rate has improved but rhythm remains AFib. After discussion of anticoagulation, the patient opts for coumadin which has been started in the hospital and will have ongoing follow up in AFib clinic.   Discharge Diagnoses:  Principal Problem:   Sepsis ([place]) Active Problems:   PAF (paroxysmal atrial fibrillation) ([facility])   Essential hypertension   Complicated urinary tract infection   Depression   HLD (hyperlipidemia)   Arthritis   Bacteremia due to Klebsiella pneumoniae  Sepsis due to Klebsiella bacteremia from urinary source: Sepsis resolved. Initial lactic acid 4.3,now down to 1.6. No vegetations on echo. - Discussed with ID, Dr. Johnnye Sima. Will need 5 days of IV  antibiotics (10/17 - 10/21 followed by a total of 2 weeks po abx. DC 10/22 on vantin (per susceptibility testing). Will avoid dosing 1 hour before to 4 hours after colestipol. Pharmacy has provided education to pt and husband.   Left ureteral stone: With difficult anatomy not allowing retrograde ureteroscopy. Renal U/S without hydronephrosis.  - Per urology, will consider elective antegrade ureteroscopy after antibiotics. Would need to be off anticoagulation for that.  Paroxysmal atrial fibrillation with rapid ventricular response: Ventricular rate now controlled. Cardiology, Dr. Irish Lack, previously discussed with pt who was hesitant about anticoagulation and had only a single episode.  - CHA2DS2-VASc is 3, now having ongoing AFib with dilated LA on echo, puts stroke risk at 3.2%/yr. HAS-BLED score is 2 (HTN, age) indicating 1.88% bleeding rate/yr on anticoagulation.  - Discussed with Dr. Caryl Comes 10/21 who confirms need for anticoagulation. After long discusion regarding coumadin vs. newer agent vs. ASA vs. no treatment, pt and husband prefer to start coumadin. Started coumadin 10/21, baseline INR normal, and arranged for INR checks prior to discharge. - Continue metoprolol at 2x home dose, rate controlled.   Elevated BNP: No hypoxia or clinical evidence of volume overload at this time, suspect related to AFib.  - Restarted HCTZ. No IVF's.   Essential HTN:  - Restart losartan, HCTZ and metoprolol  History of cholecystectomy:  - Continue colestid (generic formulation has not been effective, per family), will separate dosing from coumadin.  Depression: Chronic, stable - Decreased dose celexa to 20mg  daily due to age and risk of QTc.   Constipation: Resolved with mag citrate.  - Now requiring intermittent imodium. Encouraged to stop all pro and anti motility agents at this time.   Discharge Instructions Discharge Instructions    Diet - low sodium heart healthy  Complete by:  As  directed    Discharge instructions    Complete by:  As directed    - Start taking coumadin 5mg  daily for atrial fibrillation to reduce risk of stroke - Start taking vantin twice daily, completing the full course for Klebsiella bacteremia - Decrease celexa to 20mg  daily from 40mg  daily due to risk of QT prolongation (abnormal heart rhythms). Discuss this with your prescribing physician.  - Take all medications at least 1 hour before or 4 hours after you take colestipol.  - Restart your blood pressure medications as you were except take metoprolol 100mg  (2 of the 50mg  tablets) until you follow up with Dr. Irish Lack.  - Follow up for INR check as scheduled and with Dr. Irish Lack next week.  - Follow up with urology as scheduled. Make sure they know that coumadin was started for AFib.  - Seek medical attention right away if you experience shortness of breath, chest pain or fever.     Allergies as of 04/11/2017      Reactions   Ace Inhibitors Cough   Codeine Nausea Only   Erythromycin Other (See Comments)   Stomach pain   Iohexol Hives    Code: HIVES, Desc: patient states develops hives w/ iv contrast/mms      Medication List    STOP taking these medications   aspirin EC 81 MG tablet     TAKE these medications   ALIGN 4 MG Caps Take 1 capsule by mouth daily.   amLODipine 10 MG tablet Commonly known as:  NORVASC Take 10 mg by mouth daily.   cefpodoxime 200 MG tablet Commonly known as:  VANTIN Take 1 tablet (200 mg total) by mouth every 12 (twelve) hours.   citalopram 40 MG tablet Commonly known as:  CELEXA Take 0.5 tablets (20 mg total) by mouth daily. What changed:  how much to take   COLESTID 5 g packet Generic drug:  colestipol Take 5 g by mouth 2 (two) times daily.   CRANBERRY PO Take 1 tablet by mouth daily.   fexofenadine 180 MG tablet Commonly known as:  ALLEGRA Take 180 mg by mouth daily.   Glucomannan 1000 MG Caps Take 1,000 mg by mouth daily.   Grape  Seed 100 MG Caps Take 100 mg by mouth daily.   hydrochlorothiazide 25 MG tablet Commonly known as:  HYDRODIURIL Take 25 mg by mouth daily.   losartan 100 MG tablet Commonly known as:  COZAAR Take 100 mg by mouth daily.   MAGNESIUM CITRATE PO Take 250 mg by mouth daily.   metoprolol succinate 50 MG 24 hr tablet Commonly known as:  TOPROL-XL Take 50 mg by mouth daily. Take with or immediately following a meal.   multivitamin with minerals tablet Take 1 tablet by mouth daily. 50+ for her   nabumetone 500 MG tablet Commonly known as:  RELAFEN Take 500 mg by mouth 2 (two) times daily. Will stop prior to procedure   RABEprazole 20 MG tablet Commonly known as:  ACIPHEX Take 20 mg by mouth daily.   terazosin 10 MG capsule Commonly known as:  HYTRIN Take 10 mg by mouth at bedtime.   vitamin B-12 500 MCG tablet Commonly known as:  CYANOCOBALAMIN Take 500 mcg by mouth every Monday, Wednesday, and Friday.   vitamin C 250 MG tablet Commonly known as:  ASCORBIC ACID Take 250 mg by mouth daily.   Vitamin D-3 5000 units Tabs Take 5,000 Units by mouth every Monday, Wednesday, and  Friday.   warfarin 5 MG tablet Commonly known as:  COUMADIN Take 1 tablet (5 mg total) by mouth daily at 6 PM.      Follow-up Information    Lucas Mallow, MD Follow up on 04/12/2017.   Specialty:  Urology Why:  10:45 am Contact information: [place] [address] [phone]        Jettie Booze, MD Follow up on 04/15/2017.   Specialties:  Cardiology, Radiology, Interventional Cardiology Why:  appointment with Dr. Lance Morin, Oct 30 at 4:20 PM. INR Fri Oct. 26 at 10:30 AM  Contact information: [address] [place] [address] [phone]          Allergies  Allergen Reactions  . Ace Inhibitors Cough  . Codeine Nausea Only  . Erythromycin Other (See Comments)    Stomach pain  . Iohexol Hives     Code: HIVES, Desc: patient  states develops hives w/ iv contrast/mms     Consultations:  Urology  ID, Dr. Johnnye Sima by phone 10/19.  Cardiology, Dr. Caryl Comes 10/21.  Procedures/Studies: Dg Chest 2 View  Result Date: 04/07/2017 CLINICAL DATA:  Left flank pain.  History of left ureteral stone. EXAM: CHEST  2 VIEW COMPARISON:  09/11/2013 FINDINGS: Cardiomegaly. No confluent opacities, effusions or edema. No acute bony abnormality. IMPRESSION: Cardiomegaly.  No active disease. Electronically Signed   By: Rolm Baptise M.D.   On: 04/07/2017 10:48   US Renal  Result Date: 04/07/2017 CLINICAL DATA:  Evaluation for possible hydronephrosis. EXAM: RENAL / URINARY TRACT ULTRASOUND COMPLETE COMPARISON:  CT abdomen and pelvis 03/21/2017 FINDINGS: Right Kidney: Length: 11.3 cm. Echogenicity within normal limits. No mass or hydronephrosis visualized. Left Kidney: Length: 12 cm. Echogenicity within normal limits. No mass or hydronephrosis visualized. Bladder: No bladder wall thickening or filling defect. Urine flow jets are demonstrated bilaterally on color flow Doppler imaging. IMPRESSION: No evidence of hydronephrosis in either kidney. Electronically Signed   By: Lucienne Capers M.D.   On: 04/07/2017 02:44    Echocardiogram 04/09/2017 - Left ventricle: The cavity size was normal. There was mild concentric hypertrophy. Systolic function was normal. The estimated ejection fraction was in the range of 55% to 60%. Wall motion was normal; there were no regional wall motion abnormalities. - Aortic valve: Transvalvular velocity was within the normal range. There was no stenosis. There was no regurgitation. - Mitral valve: Transvalvular velocity was within the normal range. There was no evidence for stenosis. There was mild regurgitation. - Left atrium: The atrium was severely dilated. - Right ventricle: The cavity size was normal. Wall thickness was normal. Systolic function was normal. - Right atrium: The atrium was  moderately dilated. - Tricuspid valve: There was mild regurgitation. - Pulmonary arteries: Systolic pressure was mildly increased. PA peak pressure: 48 mm Hg (S).  Subjective: Feeling palpitations, blood pressure has started rising, but improved when restarting home medications. No chest pain or dyspnea or fever.   Discharge Exam: Vitals:   04/11/17 0452 04/11/17 0556  BP: (!) 175/64 (!) 176/70  Pulse: 87   Resp: 17   Temp: 98.1 F (36.7 C)   SpO2: 95%    General: Pt is alert, awake, not in acute distress Cardiovascular: Irreg irreg with rate in 80's Respiratory: CTA bilaterally, no wheezing, no rhonchi Abdominal: Soft, NT, ND, bowel sounds + Extremities: No edema, no cyanosis  Labs: BNP (last 3 results)  Recent Labs  04/07/17 0403  BNP 472.8*  Basic Metabolic Panel:  Recent Labs Lab 04/06/17 2220 04/08/17 0516 04/11/17 0543  NA 138 137 143  K 3.6 3.5 3.5  CL 101 104 105  CO2 24 25 29   GLUCOSE 139* 118* 114*  BUN 33* 28* 17  CREATININE 0.94 0.89 0.88  CALCIUM 9.3 7.7* 8.9  MG  --  1.8  --    Liver Function Tests: No results for input(s): AST, ALT, ALKPHOS, BILITOT, PROT, ALBUMIN in the last 168 hours. No results for input(s): LIPASE, AMYLASE in the last 168 hours. No results for input(s): AMMONIA in the last 168 hours. CBC:  Recent Labs Lab 04/06/17 2220 04/08/17 0516 04/10/17 1452 04/11/17 0543  WBC 3.1* 10.2 7.3 6.0  NEUTROABS 2.7  --   --   --   HGB 12.8 10.0* 10.4* 9.7*  HCT 37.2 29.9* 30.7* 29.3*  MCV 90.7 91.7 91.1 91.3  PLT 172 141* 224 215   Cardiac Enzymes: No results for input(s): CKTOTAL, CKMB, CKMBINDEX, TROPONINI in the last 168 hours. BNP: Invalid input(s): POCBNP CBG:  Recent Labs Lab 04/07/17 0745 04/08/17 0742 04/09/17 0735  GLUCAP 128* 146* 137*   D-Dimer No results for input(s): DDIMER in the last 72 hours. Hgb A1c No results for input(s): HGBA1C in the last 72 hours. Lipid Profile No results for input(s):  CHOL, HDL, LDLCALC, TRIG, CHOLHDL, LDLDIRECT in the last 72 hours. Thyroid function studies No results for input(s): TSH, T4TOTAL, T3FREE, THYROIDAB in the last 72 hours.  Invalid input(s): FREET3 Anemia work up No results for input(s): VITAMINB12, FOLATE, FERRITIN, TIBC, IRON, RETICCTPCT in the last 72 hours. Urinalysis    Component Value Date/Time   COLORURINE YELLOW 04/06/2017 [place] 04/06/2017 2218   LABSPEC 1.014 04/06/2017 2218   PHURINE 5.0 04/06/2017 2218   GLUCOSEU NEGATIVE 04/06/2017 2218   HGBUR MODERATE (A) 04/06/2017 2218   BILIRUBINUR NEGATIVE 04/06/2017 2218   KETONESUR NEGATIVE 04/06/2017 2218   PROTEINUR NEGATIVE 04/06/2017 2218   UROBILINOGEN 0.2 09/22/2013 0119   NITRITE NEGATIVE 04/06/2017 2218   LEUKOCYTESUR MODERATE (A) 04/06/2017 2218    Microbiology Recent Results (from the past 240 hour(s))  Urine C&S     Status: Abnormal   Collection Time: 04/06/17 10:20 PM  Result Value Ref Range Status   Specimen Description URINE, CLEAN CATCH  Final   Special Requests NONE  Final   Culture >=100,000 COLONIES/mL KLEBSIELLA PNEUMONIAE (A)  Final   Report Status 04/09/2017 FINAL  Final   Organism ID, Bacteria KLEBSIELLA PNEUMONIAE (A)  Final      Susceptibility   Klebsiella pneumoniae - MIC*    AMPICILLIN >=32 RESISTANT Resistant     CEFAZOLIN <=4 SENSITIVE Sensitive     CEFTRIAXONE <=1 SENSITIVE Sensitive     CIPROFLOXACIN <=0.25 SENSITIVE Sensitive     GENTAMICIN <=1 SENSITIVE Sensitive     IMIPENEM <=0.25 SENSITIVE Sensitive     NITROFURANTOIN 128 RESISTANT Resistant     TRIMETH/SULFA <=20 SENSITIVE Sensitive     AMPICILLIN/SULBACTAM 8 SENSITIVE Sensitive     PIP/TAZO <=4 SENSITIVE Sensitive     Extended ESBL NEGATIVE Sensitive     * >=100,000 COLONIES/mL KLEBSIELLA PNEUMONIAE  Blood culture (routine x 2)     Status: Abnormal   Collection Time: 04/06/17 11:34 PM  Result Value Ref Range Status   Specimen Description BLOOD RIGHT  ANTECUBITAL  Final   Special Requests   Final    BOTTLES DRAWN AEROBIC AND ANAEROBIC Blood Culture adequate volume   Culture  Setup Time   Final    GRAM NEGATIVE RODS IN BOTH AEROBIC AND ANAEROBIC BOTTLES CRITICAL RESULT CALLED TO, READ BACK BY AND VERIFIED WITH: D. Zeigler Pharm.D. 17:20 04/07/17 ([email or web address])    Culture KLEBSIELLA PNEUMONIAE (A)  Final   Report Status 04/09/2017 FINAL  Final   Organism ID, Bacteria KLEBSIELLA PNEUMONIAE  Final      Susceptibility   Klebsiella pneumoniae - MIC*    AMPICILLIN >=32 RESISTANT Resistant     CEFAZOLIN <=4 SENSITIVE Sensitive     CEFEPIME <=1 SENSITIVE Sensitive     CEFTAZIDIME <=1 SENSITIVE Sensitive     CEFTRIAXONE <=1 SENSITIVE Sensitive     CIPROFLOXACIN <=0.25 SENSITIVE Sensitive     GENTAMICIN <=1 SENSITIVE Sensitive     IMIPENEM <=0.25 SENSITIVE Sensitive     TRIMETH/SULFA <=20 SENSITIVE Sensitive     AMPICILLIN/SULBACTAM 8 SENSITIVE Sensitive     PIP/TAZO <=4 SENSITIVE Sensitive     Extended ESBL NEGATIVE Sensitive     * KLEBSIELLA PNEUMONIAE  Blood Culture ID Panel (Reflexed)     Status: Abnormal   Collection Time: 04/06/17 11:34 PM  Result Value Ref Range Status   Enterococcus species NOT DETECTED NOT DETECTED Final   Listeria monocytogenes NOT DETECTED NOT DETECTED Final   Staphylococcus species NOT DETECTED NOT DETECTED Final   Staphylococcus aureus NOT DETECTED NOT DETECTED Final   Streptococcus species NOT DETECTED NOT DETECTED Final   Streptococcus agalactiae NOT DETECTED NOT DETECTED Final   Streptococcus pneumoniae NOT DETECTED NOT DETECTED Final   Streptococcus pyogenes NOT DETECTED NOT DETECTED Final   Acinetobacter baumannii NOT DETECTED NOT DETECTED Final   Enterobacteriaceae species DETECTED (A) NOT DETECTED Final    Comment: Enterobacteriaceae represent a large family of gram-negative bacteria, not a single organism. CRITICAL RESULT CALLED TO, READ BACK BY AND VERIFIED WITH: D. Zeigler Pharm.D. 17:20 04/07/17  ([email or web address])    Enterobacter cloacae complex NOT DETECTED NOT DETECTED Final   Escherichia coli NOT DETECTED NOT DETECTED Final   Klebsiella oxytoca NOT DETECTED NOT DETECTED Final   Klebsiella pneumoniae DETECTED (A) NOT DETECTED Final    Comment: CRITICAL RESULT CALLED TO, READ BACK BY AND VERIFIED WITH: D. Zeigler Pharm.D. 17:20 04/07/17 ([email or web address])    Proteus species NOT DETECTED NOT DETECTED Final   Serratia marcescens NOT DETECTED NOT DETECTED Final   Carbapenem resistance NOT DETECTED NOT DETECTED Final   Haemophilus influenzae NOT DETECTED NOT DETECTED Final   Neisseria meningitidis NOT DETECTED NOT DETECTED Final   Pseudomonas aeruginosa NOT DETECTED NOT DETECTED Final   Candida albicans NOT DETECTED NOT DETECTED Final   Candida glabrata NOT DETECTED NOT DETECTED Final   Candida krusei NOT DETECTED NOT DETECTED Final   Candida parapsilosis NOT DETECTED NOT DETECTED Final   Candida tropicalis NOT DETECTED NOT DETECTED Final  Blood culture (routine x 2)     Status: Abnormal   Collection Time: 04/06/17 11:39 PM  Result Value Ref Range Status   Specimen Description BLOOD LEFT ANTECUBITAL  Final   Special Requests   Final    BOTTLES DRAWN AEROBIC AND ANAEROBIC Blood Culture adequate volume   Culture  Setup Time   Final    GRAM NEGATIVE RODS IN BOTH AEROBIC AND ANAEROBIC BOTTLES CRITICAL VALUE NOTED.  VALUE IS CONSISTENT WITH PREVIOUSLY REPORTED AND CALLED VALUE.    Culture (A)  Final    KLEBSIELLA PNEUMONIAE SUSCEPTIBILITIES PERFORMED ON PREVIOUS CULTURE WITHIN THE LAST 5 DAYS. Performed at [facility], [place] [address], [place], [place]  [address]    Report Status 04/09/2017 FINAL  Final    Time coordinating discharge: Approximately 40 minutes  Vance Gather, MD  [facility] 04/11/2017, 12:26 PM Pager [phone]

## 2017-04-11 NOTE — Progress Notes (Addendum)
ANTICOAGULATION CONSULT NOTE - [place] for Coumadin Indication: atrial fibrillation  Allergies  Allergen Reactions  . Ace Inhibitors Cough  . Codeine Nausea Only  . Erythromycin Other (See Comments)    Stomach pain  . Iohexol Hives     Code: HIVES, Desc: patient states develops hives w/ iv contrast/mms    Patient Measurements: Height: 5\' 7"  (170.2 cm) Weight: 205 lb 4 oz (93.1 kg) IBW/kg (Calculated) : 61.6  Vital Signs: Temp: 98.1 F (36.7 C) (10/22 0452) Temp Source: Oral (10/22 0452) BP: 176/70 (10/22 0556) Pulse Rate: 87 (10/22 0452)  Labs:  Recent Labs  04/10/17 1452 04/11/17 0543  HGB 10.4* 9.7*  HCT 30.7* 29.3*  PLT 224 215  LABPROT 13.1 14.0  INR 1.00 1.09  CREATININE  --  0.88    Estimated Creatinine Clearance: 67.7 mL/min (by C-G formula based on SCr of 0.88 mg/dL).   Medical History: Past Medical History:  Diagnosis Date  . Anxiety   . Arthritis    "joints" (09/19/2013)  . Atrial fibrillation ([place])   . Basal cell carcinoma of cheek    left  . Chronic lower back pain   . GERD (gastroesophageal reflux disease)   . H/O hiatal hernia   . Hypertension   . Migraine    "haven't had any in a long time" (09/19/2013)  . Pneumonia ~ 2012     Assessment: 71 yo F admitted 10/18, 2 days s/p cystoscopy, with fever, chills, and tachycardia. Broad spectrum abx per pharmacy. In A.fib, no anticoagulation so far this admission. Decision is made to start anticoagulation with warfarin on 10/21. CHA2DS2-VASc is 3, HAS-BLED score is 2.   Today, 04/11/17:   INR 1.09 after one dose of warfarin 7.5mg  PO   CBC: Hgb decreased to 9.7, Pltc WNL  No bleeding issues reported per nursing  Diet: heart healthy, tolerating  DDI: nabumetone increases chance of gastritis/GIB. Colestipol can bind warfarin and prevent absorption. Antibiotics (cefpodoxime) can increase INR (avoiding quinolones and bactrim, which have much greater effect on  INR)  Goal of Therapy:  INR 2-3 Monitor platelets by anticoagulation protocol: Yes   Plan:   Give warfarin 7.5mg  PO x 1 prior to discharge today.  Recommend warfarin 5mg  PO daily at discharge (starting 10/23) with INR follow-up ~ 2 days after d/c.   Check PT/INR daily while hospitalized.  Per MD, patient will need to remain on nabumetone for now. Monitor closely for s/sx of bleeding.  Discussed again with patient today of taking warfarin at least one hour before or 4 hours after colestipol.    Lindell Spar, PharmD, BCPS Pager: [phone] 04/11/2017 11:24 AM

## 2017-04-11 NOTE — Progress Notes (Signed)
INR Friday Oct 26 at 10:30 AM, Appointment with Dr. Lance Morin OCt 30 at 4:20 PM.

## 2017-04-11 NOTE — Care Management Important Message (Signed)
Important Message  Patient Details  Name: Sierra [name] MRN: [id number] Date of Birth: Dec 17, 1945   Medicare Important Message Given:  Yes    Kerin Salen 04/11/2017, 12:52 [place] Message  Patient Details  Name: Sierra [name] MRN: [id number] Date of Birth: [birth year]   Medicare Important Message Given:  Yes    Kerin Salen 04/11/2017, 12:52 PM

## 2017-04-11 NOTE — Discharge Instructions (Signed)
Information on my medicine - Coumadin®   (Warfarin) ° °Why was Coumadin prescribed for you? °Coumadin was prescribed for you because you have a blood clot or a medical condition that can cause an increased risk of forming blood clots. Blood clots can cause serious health problems by blocking the flow of blood to the heart, lung, or brain. Coumadin can prevent harmful blood clots from forming. °As a reminder your indication for Coumadin is:   Stroke Prevention Because Of Atrial Fibrillation ° °What test will check on my response to Coumadin? °While on Coumadin (warfarin) you will need to have an INR test regularly to ensure that your dose is keeping you in the desired range. The INR (international normalized ratio) number is calculated from the result of the laboratory test called prothrombin time (PT). ° °If an INR APPOINTMENT HAS NOT ALREADY BEEN MADE FOR YOU please schedule an appointment to have this lab work done by your health care provider within 7 days. °Your INR goal is usually a number between:  2 to 3 or your provider may give you a more narrow range like 2-2.5.  Ask your health care provider during an office visit what your goal INR is. ° °What  do you need to  know  About  COUMADIN? °Take Coumadin (warfarin) exactly as prescribed by your healthcare provider about the same time each day.  DO NOT stop taking without talking to the doctor who prescribed the medication.  Stopping without other blood clot prevention medication to take the place of Coumadin may increase your risk of developing a new clot or stroke.  Get refills before you run out. ° °What do you do if you miss a dose? °If you miss a dose, take it as soon as you remember on the same day then continue your regularly scheduled regimen the next day.  Do not take two doses of Coumadin at the same time. ° °Important Safety Information °A possible side effect of Coumadin (Warfarin) is an increased risk of bleeding. You should call your healthcare  provider right away if you experience any of the following: °? Bleeding from an injury or your nose that does not stop. °? Unusual colored urine (red or dark brown) or unusual colored stools (red or black). °? Unusual bruising for unknown reasons. °? A serious fall or if you hit your head (even if there is no bleeding). ° °Some foods or medicines interact with Coumadin® (warfarin) and might alter your response to warfarin. To help avoid this: °? Eat a balanced diet, maintaining a consistent amount of Vitamin K. °? Notify your provider about major diet changes you plan to make. °? Avoid alcohol or limit your intake to 1 drink for women and 2 drinks for men per day. °(1 drink is 5 oz. wine, 12 oz. beer, or 1.5 oz. liquor.) ° °Make sure that ANY health care provider who prescribes medication for you knows that you are taking Coumadin (warfarin).  Also make sure the healthcare provider who is monitoring your Coumadin knows when you have started a new medication including herbals and non-prescription products. ° °Coumadin® (Warfarin)  Major Drug Interactions  °Increased Warfarin Effect Decreased Warfarin Effect  °Alcohol (large quantities) °Antibiotics (esp. Septra/Bactrim, Flagyl, Cipro) °Amiodarone (Cordarone) °Aspirin (ASA) °Cimetidine (Tagamet) °Megestrol (Megace) °NSAIDs (ibuprofen, naproxen, etc.) °Piroxicam (Feldene) °Propafenone (Rythmol SR) °Propranolol (Inderal) °Isoniazid (INH) °Posaconazole (Noxafil) Barbiturates (Phenobarbital) °Carbamazepine (Tegretol) °Chlordiazepoxide (Librium) °Cholestyramine (Questran) °Griseofulvin °Oral Contraceptives °Rifampin °Sucralfate (Carafate) °Vitamin K  ° °Coumadin® (Warfarin) Major Herbal   Interactions  Increased Warfarin Effect Decreased Warfarin Effect  Garlic Ginseng Ginkgo biloba Coenzyme Q10 Green tea St. Johns wort    Coumadin (Warfarin) FOOD Interactions  Eat a consistent number of servings per week of foods HIGH in Vitamin K (1 serving =  cup)  Collards  (cooked, or boiled & drained) Kale (cooked, or boiled & drained) Mustard greens (cooked, or boiled & drained) Parsley *serving size only =  cup Spinach (cooked, or boiled & drained) Swiss chard (cooked, or boiled & drained) Turnip greens (cooked, or boiled & drained)  Eat a consistent number of servings per week of foods MEDIUM-HIGH in Vitamin K (1 serving = 1 cup)  Asparagus (cooked, or boiled & drained) Broccoli (cooked, boiled & drained, or raw & chopped) Brussel sprouts (cooked, or boiled & drained) *serving size only =  cup Lettuce, raw (green leaf, endive, romaine) Spinach, raw Turnip greens, raw & chopped   These websites have more information on Coumadin (warfarin):  FailFactory.se; VeganReport.com.au;   Take other medications at least one hour before or 4 hours after colestipol.   Separate administration of antibiotic, Cefpodoxime (Vantin), and Multivitamin by at least 2 hours.

## 2017-04-12 DIAGNOSIS — N201 Calculus of ureter: Secondary | ICD-10-CM | POA: Diagnosis not present

## 2017-04-18 ENCOUNTER — Ambulatory Visit (INDEPENDENT_AMBULATORY_CARE_PROVIDER_SITE_OTHER): Payer: Medicare Other | Admitting: *Deleted

## 2017-04-18 DIAGNOSIS — I480 Paroxysmal atrial fibrillation: Secondary | ICD-10-CM | POA: Diagnosis not present

## 2017-04-18 DIAGNOSIS — Z5181 Encounter for therapeutic drug level monitoring: Secondary | ICD-10-CM | POA: Diagnosis not present

## 2017-04-18 LAB — POCT INR: INR: 4

## 2017-04-18 NOTE — Patient Instructions (Signed)

## 2017-04-19 ENCOUNTER — Ambulatory Visit (INDEPENDENT_AMBULATORY_CARE_PROVIDER_SITE_OTHER): Payer: Medicare Other | Admitting: Interventional Cardiology

## 2017-04-19 ENCOUNTER — Encounter: Payer: Self-pay | Admitting: Interventional Cardiology

## 2017-04-19 VITALS — BP 122/58 | HR 81 | Ht 67.0 in | Wt 196.4 lb

## 2017-04-19 DIAGNOSIS — Z7901 Long term (current) use of anticoagulants: Secondary | ICD-10-CM | POA: Diagnosis not present

## 2017-04-19 DIAGNOSIS — I10 Essential (primary) hypertension: Secondary | ICD-10-CM | POA: Diagnosis not present

## 2017-04-19 DIAGNOSIS — I4819 Other persistent atrial fibrillation: Secondary | ICD-10-CM

## 2017-04-19 DIAGNOSIS — I481 Persistent atrial fibrillation: Secondary | ICD-10-CM | POA: Diagnosis not present

## 2017-04-19 NOTE — Progress Notes (Signed)
Cardiology Office Note   Date:  04/19/2017   ID:  Sierra [name], Sierra [name] [birth year], MRN [id number]  PCP:  Lavone Orn, MD    No chief complaint on file.  PAF  Wt Readings from Last 3 Encounters:  04/19/17 196 lb 6.4 oz (89.1 kg)  04/07/17 205 lb 4 oz (93.1 kg)  07/05/16 191 lb (86.6 kg)       History of Present Illness: Sierra [name] is a 71 y.o. female  Who has HTN, PACs for many years.  In September 2016, she was feeling palpitations thought to be from PACs.  In December 2016, she had a severe GI bug and went to the ER.  She had atrial fibrillation diagnosed in the ambulance.    She had been doing well.    She had a complicated urinary infection.  SHe was hospitalized in evaluated with urology.  She had a high fever.    She has improved.  SHe may need an additional urologic procedure to help.    She was started on Coumadin.  SHe is not interested in a DOAC.  Her husband is asking when they can resume sexual activity.  Past Medical History:  Diagnosis Date  . Anxiety   . Arthritis    "joints" (09/19/2013)  . Atrial fibrillation ([place])   . Basal cell carcinoma of cheek    left  . Chronic lower back pain   . GERD (gastroesophageal reflux disease)   . H/O hiatal hernia   . Hypertension   . Migraine    "haven't had any in a long time" (09/19/2013)  . Pneumonia ~ 2012    Past Surgical History:  Procedure Laterality Date  . BASAL CELL CARCINOMA EXCISION Left    "cheek"  . CHOLECYSTECTOMY  1990's  . COLONOSCOPY WITH PROPOFOL N/A 07/05/2016   Procedure: COLONOSCOPY WITH PROPOFOL;  Surgeon: Garlan Fair, MD;  Location: [facility];  Service: Endoscopy;  Laterality: N/A;  . DILATION AND CURETTAGE OF UTERUS    . HERNIA REPAIR  5456   umbilical  . JOINT REPLACEMENT    . OOPHORECTOMY Bilateral   . REVISION TOTAL HIP ARTHROPLASTY Left 09/19/2013  . SHOULDER ARTHROSCOPY W/ ROTATOR CUFF REPAIR Right   . TMJ ARTHROPLASTY    . TOTAL HIP ARTHROPLASTY Left 2009  .  TOTAL HIP ARTHROPLASTY Right 2011  . TOTAL HIP REVISION Left 09/19/2013   Procedure: LEFT TOTAL HIP REVISION;  Surgeon: Kerin Salen, MD;  Location: [facility];  Service: Orthopedics;  Laterality: Left;  . TUBAL LIGATION    . VAGINAL HYSTERECTOMY  1990's     Current Outpatient Prescriptions  Medication Sig Dispense Refill  . amLODipine (NORVASC) 10 MG tablet Take 10 mg by mouth daily.    . cefpodoxime (VANTIN) 200 MG tablet Take 1 tablet (200 mg total) by mouth every 12 (twelve) hours. 17 tablet 0  . Cholecalciferol (VITAMIN D-3) 5000 UNITS TABS Take 5,000 Units by mouth every Monday, Wednesday, and Friday.     . citalopram (CELEXA) 40 MG tablet Take 0.5 tablets (20 mg total) by mouth daily.    . COLESTID 5 g packet Take 5 g by mouth 2 (two) times daily.   3  . CRANBERRY PO Take 1 tablet by mouth daily.    . fexofenadine (ALLEGRA) 180 MG tablet Take 180 mg by mouth daily.    . Glucomannan 1000 MG CAPS Take 1,000 mg by mouth daily.    . metoprolol succinate (TOPROL-XL) 50  MG 24 hr tablet Take with or immediately following a meal.    . Multiple Vitamins-Minerals (MULTIVITAMIN WITH MINERALS) tablet Take 1 tablet by mouth daily. 50+ for her    . nabumetone (RELAFEN) 500 MG tablet Take 500 mg by mouth 2 (two) times daily. Will stop prior to procedure    . Probiotic Product (ALIGN) 4 MG CAPS Take 1 capsule by mouth daily.    . RABEprazole (ACIPHEX) 20 MG tablet Take 20 mg by mouth daily.     Marland Kitchen terazosin (HYTRIN) 10 MG capsule Take 10 mg by mouth at bedtime.    . valsartan-hydrochlorothiazide (DIOVAN-HCT) 160-25 MG tablet Take 1 tablet by mouth daily.    . vitamin B-12 (CYANOCOBALAMIN) 500 MCG tablet Take 500 mcg by mouth every Monday, Wednesday, and Friday.     . vitamin C (ASCORBIC ACID) 250 MG tablet Take 250 mg by mouth daily.    Marland Kitchen warfarin (COUMADIN) 5 MG tablet Take 1 tablet (5 mg total) by mouth daily at 6 PM. 30 tablet 0  . losartan (COZAAR) 100 MG tablet Take 100 mg by mouth daily.  5   No  current facility-administered medications for this visit.     Allergies:   Ace inhibitors; Codeine; Erythromycin; and Iohexol    Social History:  The patient  reports that she has never smoked. She has never used smokeless tobacco. She reports that she drinks alcohol. She reports that she does not use drugs.   Family History:  The patient's family history includes Heart attack in her maternal aunt, mother, and paternal uncle.    ROS:  Please see the history of present illness.   Otherwise, review of systems are positive for recent urologic issues.   All other systems are reviewed and negative.    PHYSICAL EXAM: VS:  BP (!) 122/58   Pulse 81   Ht 5\' 7"  (1.702 m)   Wt 196 lb 6.4 oz (89.1 kg)   SpO2 96%   BMI 30.76 kg/m  , BMI Body mass index is 30.76 kg/m. GEN: Well nourished, well developed, in no acute distress  HEENT: normal  Neck: no JVD, carotid bruits, or masses Cardiac: irregularly irregular; no murmurs, rubs, or gallops,no edema  Respiratory:  clear to auscultation bilaterally, normal work of breathing GI: soft, nontender, nondistended, + BS MS: no deformity or atrophy  Skin: warm and dry, no rash Neuro:  Strength and sensation are intact Psych: euthymic mood, full affect   Recent Labs: 04/07/2017: B Natriuretic Peptide 472.8 04/08/2017: Magnesium 1.8 04/11/2017: BUN 17; Creatinine, Ser 0.88; Hemoglobin 9.7; Platelets 215; Potassium 3.5; Sodium 143   Lipid Panel No results found for: CHOL, TRIG, HDL, CHOLHDL, VLDL, LDLCALC, LDLDIRECT   Other studies Reviewed: Additional studies/ records that were reviewed today with results demonstrating: hospital records reviewed.   ASSESSMENT AND PLAN:  1. AFib: Coumadin for stroke prevention.  Rate controlled on metoprolol. Would not pursue NSR at this time until decision is made regarding urologic procedure.   2. HTN: losartan and amlodipine for BP control.  3. Anticoagulated: Do not want to interrupt anticoagulation if we  did DCCV.  Since repeat urologic procedure may be needed, will hold of on cardioversion.     Current medicines are reviewed at length with the patient today.  The patient concerns regarding her medicines were addressed.  The following changes have been made:  No change  Labs/ tests ordered today include:  No orders of the defined types were placed in this encounter.  Recommend 150 minutes/week of aerobic exercise Low fat, low carb, high fiber diet recommended  Disposition:   FU in 3 months   Signed, Larae Grooms, MD  04/19/2017 4:39 PM    [facility] [place], [place], [place]  [address] Phone: [phone]; Fax: [phone]

## 2017-04-19 NOTE — Patient Instructions (Signed)
Medication Instructions:  Your physician recommends that you continue on your current medications as directed. Please refer to the Current Medication list given to you today.   Labwork: None ordered.  Testing/Procedures: None ordered.  Follow-Up: Your physician recommends that you schedule a follow-up appointment in: 3 months with Dr. Varanasi.   Any Other Special Instructions Will Be Listed Below (If Applicable).     If you need a refill on your cardiac medications before your next appointment, please call your pharmacy.  

## 2017-04-20 DIAGNOSIS — C44722 Squamous cell carcinoma of skin of right lower limb, including hip: Secondary | ICD-10-CM | POA: Diagnosis not present

## 2017-04-20 DIAGNOSIS — Z85828 Personal history of other malignant neoplasm of skin: Secondary | ICD-10-CM | POA: Diagnosis not present

## 2017-04-20 DIAGNOSIS — L570 Actinic keratosis: Secondary | ICD-10-CM | POA: Diagnosis not present

## 2017-04-20 DIAGNOSIS — D485 Neoplasm of uncertain behavior of skin: Secondary | ICD-10-CM | POA: Diagnosis not present

## 2017-04-20 DIAGNOSIS — L814 Other melanin hyperpigmentation: Secondary | ICD-10-CM | POA: Diagnosis not present

## 2017-04-20 DIAGNOSIS — D1801 Hemangioma of skin and subcutaneous tissue: Secondary | ICD-10-CM | POA: Diagnosis not present

## 2017-04-20 DIAGNOSIS — L821 Other seborrheic keratosis: Secondary | ICD-10-CM | POA: Diagnosis not present

## 2017-04-20 DIAGNOSIS — D225 Melanocytic nevi of trunk: Secondary | ICD-10-CM | POA: Diagnosis not present

## 2017-04-25 ENCOUNTER — Ambulatory Visit (INDEPENDENT_AMBULATORY_CARE_PROVIDER_SITE_OTHER): Payer: Medicare Other | Admitting: *Deleted

## 2017-04-25 DIAGNOSIS — K219 Gastro-esophageal reflux disease without esophagitis: Secondary | ICD-10-CM | POA: Diagnosis not present

## 2017-04-25 DIAGNOSIS — I481 Persistent atrial fibrillation: Secondary | ICD-10-CM | POA: Diagnosis not present

## 2017-04-25 DIAGNOSIS — Z5181 Encounter for therapeutic drug level monitoring: Secondary | ICD-10-CM

## 2017-04-25 DIAGNOSIS — Z13820 Encounter for screening for osteoporosis: Secondary | ICD-10-CM | POA: Diagnosis not present

## 2017-04-25 DIAGNOSIS — Z1389 Encounter for screening for other disorder: Secondary | ICD-10-CM | POA: Diagnosis not present

## 2017-04-25 DIAGNOSIS — N390 Urinary tract infection, site not specified: Secondary | ICD-10-CM | POA: Diagnosis not present

## 2017-04-25 DIAGNOSIS — I10 Essential (primary) hypertension: Secondary | ICD-10-CM | POA: Diagnosis not present

## 2017-04-25 DIAGNOSIS — Z0000 Encounter for general adult medical examination without abnormal findings: Secondary | ICD-10-CM | POA: Diagnosis not present

## 2017-04-25 DIAGNOSIS — A419 Sepsis, unspecified organism: Secondary | ICD-10-CM | POA: Diagnosis not present

## 2017-04-25 DIAGNOSIS — I480 Paroxysmal atrial fibrillation: Secondary | ICD-10-CM | POA: Diagnosis not present

## 2017-04-25 LAB — PROTIME-INR
INR: 3 — AB (ref 0.8–1.2)
PROTHROMBIN TIME: 31.8 s — AB (ref 9.1–12.0)

## 2017-05-03 ENCOUNTER — Ambulatory Visit (INDEPENDENT_AMBULATORY_CARE_PROVIDER_SITE_OTHER): Payer: Medicare Other | Admitting: *Deleted

## 2017-05-03 DIAGNOSIS — I480 Paroxysmal atrial fibrillation: Secondary | ICD-10-CM

## 2017-05-03 DIAGNOSIS — Z5181 Encounter for therapeutic drug level monitoring: Secondary | ICD-10-CM

## 2017-05-03 LAB — POCT INR: INR: 3.8

## 2017-05-03 NOTE — Patient Instructions (Signed)
Do not take any Coumadin tomorrow (already taken today's dose), then start taking 1/2 tablet everyday except 1 tablet on Monday, Wednesday, and Friday.

## 2017-05-05 ENCOUNTER — Telehealth: Payer: Self-pay | Admitting: Interventional Cardiology

## 2017-05-05 NOTE — Telephone Encounter (Signed)
Patient states that she has been having swelling in her lower extremities for the past 3 days. She states that she does elevate her legs sometimes. She does not have any SOB or any other symptoms. Patient does not weigh herself daily. Patient states that she eats fast food every night for dinner. Patient takes valsartan-HCTZ 160-25 mg QD and amlodipine 10 mg QD. Patient's BP 120/68. Advised patient to elevate her legs, wear compression stockings, limit the amount of salt in her diet, weigh herself daily at the same time everyday with the same amount of clothes and to let us know if her symptoms do not improve. Patient verbalized understanding.

## 2017-05-05 NOTE — Telephone Encounter (Signed)
Returned call to pt & informed pt that Coumadin does not cause swelling in legs & feet. Pt would appreciate an answer from the Provider because this issue is new & she doesn't want it to get worse. Pt currently denies any distress. Advised pt that this issue will be forwarded back to Physician/RN.

## 2017-05-05 NOTE — Telephone Encounter (Signed)
New Message   Just started coumadin whats to know if it can cause swelling in feet in and legs  Pt c/o medication issue:  1. Name of Medication: warfarin (COUMADIN) 5 MG tablet  2. How are you currently taking this medication (dosage and times per day)? As Prescribed  3. Are you having a reaction (difficulty breathing--STAT)? No  4. What is your medication issue? Per pt just started medication and is experiencing swelling in both legs and feet.

## 2017-05-06 ENCOUNTER — Telehealth: Payer: Self-pay | Admitting: Interventional Cardiology

## 2017-05-06 DIAGNOSIS — I509 Heart failure, unspecified: Secondary | ICD-10-CM | POA: Diagnosis not present

## 2017-05-06 DIAGNOSIS — I481 Persistent atrial fibrillation: Secondary | ICD-10-CM | POA: Diagnosis not present

## 2017-05-06 NOTE — Telephone Encounter (Signed)
Called patient back. Patient saw her PCP, Dr. Laurann Montana today. Patient is going to start Lasix today. Informed patient to take medication as prescribed by Dr. Laurann Montana and for her to keep her follow-up appointment for next week. Informed patient if her symptoms do not improve after taking the Lasix to give our office a call. Patient verbalized understanding.

## 2017-05-06 NOTE — Telephone Encounter (Signed)
New Message     Pt c/o swelling: STAT is pt has developed SOB within 24 hours  How much weight have you gained and in what time span?  12lb in 4 days  1) If swelling, where is the swelling located? Abdomin and both legs all the way from thighs to feet   2) Are you currently taking a fluid pill? yes  Are you currently SOB?  Yes w exertion 3) Do you have a log of your daily weights (if so, list)?  no  4) Have you gained 3 pounds in a day or 5 pounds in a week? Yes   5) Have you traveled recently?  no   Saw Dr Laurann Montana today and he put her on a Lasik , he wanted her to get appt , I have her scheduled for Tuesday 05/10/17

## 2017-05-06 NOTE — Telephone Encounter (Signed)
[name] is calling because she saw Dr. Laurann Montana today about her extreme fluid problem and he will send Dr. Irish Lack the information in which he found and she would like to know what he thinks about and Dr. Laurann Montana thinks its from her AFIB . She is scheduled to come in in January . Please call

## 2017-05-07 NOTE — Progress Notes (Signed)
Cardiology Office Note   Date:  05/10/2017   ID:  Deeann, Servidio Nov 19, 1945, MRN [id number]  PCP:  Lavone Orn, MD    No chief complaint on file. AFib   Wt Readings from Last 3 Encounters:  05/10/17 207 lb 12.8 oz (94.3 kg)  04/19/17 196 lb 6.4 oz (89.1 kg)  04/07/17 205 lb 4 oz (93.1 kg)       History of Present Illness: Sierra [name] is a 71 y.o. female  Who has HTN, PACs for many years. In September 2016, she was feeling palpitations thought to be from PACs. In December 2016, she had a severe GI bug and went to the ER. She had atrial fibrillation diagnosed in the ambulance.   She had a complicated urinary infection.  SHe was hospitalized in evaluated with urology.  She had a high fever.    She has improved.  SHe may need an additional urologic procedure to help.    She was started on Coumadin.  SHe is not interested in a DOAC.  Recent monitor showed AFib with RVR.  Rate control meds added.  She reports fluid overload with a 12 lb weight gain.    Denies : Chest pain. Dizziness.  Nitroglycerin use. Orthopnea. Palpitations. Paroxysmal nocturnal dyspnea. Shortness of breath. Syncope.     Past Medical History:  Diagnosis Date  . Anxiety   . Arthritis    "joints" (09/19/2013)  . Atrial fibrillation ([place])   . Basal cell carcinoma of cheek    left  . Chronic lower back pain   . GERD (gastroesophageal reflux disease)   . H/O hiatal hernia   . Hypertension   . Migraine    "haven't had any in a long time" (09/19/2013)  . Pneumonia ~ 2012    Past Surgical History:  Procedure Laterality Date  . BASAL CELL CARCINOMA EXCISION Left    "cheek"  . CHOLECYSTECTOMY  1990's  . COLONOSCOPY WITH PROPOFOL N/A 07/05/2016   Procedure: COLONOSCOPY WITH PROPOFOL;  Surgeon: Garlan Fair, MD;  Location: [facility];  Service: Endoscopy;  Laterality: N/A;  . DILATION AND CURETTAGE OF UTERUS    . HERNIA REPAIR  4008   umbilical  . JOINT REPLACEMENT    .  OOPHORECTOMY Bilateral   . REVISION TOTAL HIP ARTHROPLASTY Left 09/19/2013  . SHOULDER ARTHROSCOPY W/ ROTATOR CUFF REPAIR Right   . TMJ ARTHROPLASTY    . TOTAL HIP ARTHROPLASTY Left 2009  . TOTAL HIP ARTHROPLASTY Right 2011  . TOTAL HIP REVISION Left 09/19/2013   Procedure: LEFT TOTAL HIP REVISION;  Surgeon: Kerin Salen, MD;  Location: [name];  Service: Orthopedics;  Laterality: Left;  . TUBAL LIGATION    . VAGINAL HYSTERECTOMY  1990's     Current Outpatient Medications  Medication Sig Dispense Refill  . amLODipine (NORVASC) 10 MG tablet Take 10 mg by mouth daily.    . Cholecalciferol (VITAMIN D-3) 5000 UNITS TABS Take 5,000 Units by mouth every Monday, Wednesday, and Friday.     . citalopram (CELEXA) 40 MG tablet Take 0.5 tablets (20 mg total) by mouth daily.    . COLESTID 5 g packet Take 5 g by mouth 2 (two) times daily.   3  . fexofenadine (ALLEGRA) 180 MG tablet Take 180 mg by mouth daily.    . furosemide (LASIX) 40 MG tablet Take 80 mg by mouth daily.  5  . Glucomannan 1000 MG CAPS Take 1,000 mg by mouth daily.    Marland Kitchen  glucosamine-chondroitin 500-400 MG tablet Take 1 tablet by mouth daily.    Marland Kitchen MAGNESIUM CITRATE PO Take 250 mg daily by mouth.    . metoprolol succinate (TOPROL-XL) 50 MG 24 hr tablet Take 100 mg by mouth daily. Take with or immediately following a meal.     . Multiple Vitamins-Minerals (MULTIVITAMIN WITH MINERALS) tablet Take 1 tablet by mouth daily. 50+ for her    . nabumetone (RELAFEN) 500 MG tablet Take 500 mg by mouth 2 (two) times daily. Will stop prior to procedure    . Probiotic Product (ALIGN) 4 MG CAPS Take 1 capsule by mouth daily.    . RABEprazole (ACIPHEX) 20 MG tablet Take 20 mg by mouth daily.     Marland Kitchen terazosin (HYTRIN) 10 MG capsule Take 10 mg by mouth at bedtime.    . valsartan-hydrochlorothiazide (DIOVAN-HCT) 160-25 MG tablet Take 1 tablet by mouth daily.    . vitamin B-12 (CYANOCOBALAMIN) 500 MCG tablet Take 500 mcg by mouth every Monday, Wednesday, and  Friday.     . vitamin C (ASCORBIC ACID) 250 MG tablet Take 250 mg by mouth 3 (three) times a week.     . warfarin (COUMADIN) 5 MG tablet Take 1 tablet (5 mg total) by mouth daily at 6 PM. 30 tablet 0   No current facility-administered medications for this visit.     Allergies:   Ace inhibitors; Codeine; Erythromycin; and Iohexol    Social History:  The patient  reports that  has never smoked. she has never used smokeless tobacco. She reports that she drinks alcohol. She reports that she does not use drugs.   Family History:  The patient's family history includes Heart attack in her maternal aunt, mother, and paternal uncle.    ROS:  Please see the history of present illness.   Otherwise, review of systems are positive for leg edema.   All other systems are reviewed and negative.    PHYSICAL EXAM: VS:  BP 118/62   Pulse 75   Ht 5\' 7"  (1.702 m)   Wt 207 lb 12.8 oz (94.3 kg)   SpO2 97%   BMI 32.55 kg/m  , BMI Body mass index is 32.55 kg/m. GEN: Well nourished, well developed, in no acute distress  HEENT: normal  Neck: no JVD, carotid bruits, or masses Cardiac: irregularly irregular; no murmurs, rubs, or gallops,no edema  Respiratory:  clear to auscultation bilaterally, normal work of breathing GI: soft, nontender, nondistended, + BS MS: no deformity or atrophy  Skin: warm and dry, no rash Neuro:  Strength and sensation are intact Psych: euthymic mood, full affect   EKG:   The ekg ordered today demonstrates AFib, controlled rate   Recent Labs: 04/07/2017: B Natriuretic Peptide 472.8 04/08/2017: Magnesium 1.8 04/11/2017: BUN 17; Creatinine, Ser 0.88; Hemoglobin 9.7; Platelets 215; Potassium 3.5; Sodium 143   Lipid Panel No results found for: CHOL, TRIG, HDL, CHOLHDL, VLDL, LDLCALC, LDLDIRECT   Other studies Reviewed: Additional studies/ records that were reviewed today with results demonstrating: EF 55-60% in 10/18.   ASSESSMENT AND PLAN:  1. AFib: rate controlled.   Xarelto for stroke prevention.   2. Chronic diastolic heart failure: No signs of left sided failure.  Continue Lasix 80 mg daily.  Could switch to torsemide for improved bioavailability if she does not respond to Lasix.  She does not report urinating significantly more with Lasix.  Dr. Laurann Montana following her electrolytes.  Since she has been started on Lasix, would like to stop her  HCTZ.  We will change Diovan/HCTZ to Diovan 160 mg daily. 3. Leg edema: Elevate legs to help reduce swelling. 4. Hypertension: Blood pressure control.  Continue current medicines.   Current medicines are reviewed at length with the patient today.  The patient concerns regarding her medicines were addressed.  The following changes have been made: Stop HCTZ  Labs/ tests ordered today include:  No orders of the defined types were placed in this encounter.   Recommend 150 minutes/week of aerobic exercise Low fat, low carb, high fiber diet recommended  Disposition:   FU as scheduled in January   Signed, Aine Strycharz, MD  05/10/2017 12:08 PM    [facility], [place], [place]  [address] Phone: [phone]; Fax: [phone]

## 2017-05-10 ENCOUNTER — Ambulatory Visit (INDEPENDENT_AMBULATORY_CARE_PROVIDER_SITE_OTHER): Payer: Medicare Other | Admitting: Interventional Cardiology

## 2017-05-10 ENCOUNTER — Ambulatory Visit (INDEPENDENT_AMBULATORY_CARE_PROVIDER_SITE_OTHER): Payer: Medicare Other | Admitting: *Deleted

## 2017-05-10 ENCOUNTER — Encounter: Payer: Self-pay | Admitting: Interventional Cardiology

## 2017-05-10 VITALS — BP 118/62 | HR 75 | Ht 67.0 in | Wt 207.8 lb

## 2017-05-10 DIAGNOSIS — R600 Localized edema: Secondary | ICD-10-CM | POA: Diagnosis not present

## 2017-05-10 DIAGNOSIS — I480 Paroxysmal atrial fibrillation: Secondary | ICD-10-CM

## 2017-05-10 DIAGNOSIS — I5032 Chronic diastolic (congestive) heart failure: Secondary | ICD-10-CM

## 2017-05-10 DIAGNOSIS — Z5181 Encounter for therapeutic drug level monitoring: Secondary | ICD-10-CM

## 2017-05-10 DIAGNOSIS — I10 Essential (primary) hypertension: Secondary | ICD-10-CM

## 2017-05-10 LAB — POCT INR: INR: 2.2

## 2017-05-10 MED ORDER — VALSARTAN 160 MG PO TABS: 160.0000 mg | ORAL_TABLET | Freq: Every day | ORAL | 3 refills | Status: DC

## 2017-05-10 NOTE — Patient Instructions (Addendum)
Medication Instructions:  Your physician has recommended you make the following change in your medication:   1. CONTINUE taking furosemide (lasix) 80 mg daily  2. STOP valsartan/Hydrochlorothiazide 160-25 mg  3. START valsartan 160 mg daily  Labwork: None ordered  Testing/Procedures: None ordered  Follow-Up: Your physician wants you to keep follow up appointment on July 20, 2017 at 2:00 PM.    Any Other Special Instructions Will Be Listed Below (If Applicable).     If you need a refill on your cardiac medications before your next appointment, please call your pharmacy.

## 2017-05-16 ENCOUNTER — Other Ambulatory Visit: Payer: Self-pay | Admitting: *Deleted

## 2017-05-16 DIAGNOSIS — I50811 Acute right heart failure: Secondary | ICD-10-CM | POA: Diagnosis not present

## 2017-05-16 DIAGNOSIS — I481 Persistent atrial fibrillation: Secondary | ICD-10-CM | POA: Diagnosis not present

## 2017-05-16 MED ORDER — WARFARIN SODIUM 5 MG PO TABS: ORAL_TABLET | ORAL | 0 refills | Status: DC

## 2017-05-16 NOTE — Telephone Encounter (Signed)
Patient requested a ninety day rx.

## 2017-05-18 ENCOUNTER — Ambulatory Visit (INDEPENDENT_AMBULATORY_CARE_PROVIDER_SITE_OTHER): Payer: Medicare Other

## 2017-05-18 DIAGNOSIS — Z5181 Encounter for therapeutic drug level monitoring: Secondary | ICD-10-CM

## 2017-05-18 DIAGNOSIS — I480 Paroxysmal atrial fibrillation: Secondary | ICD-10-CM | POA: Diagnosis not present

## 2017-05-18 LAB — POCT INR: INR: 2.4

## 2017-05-18 NOTE — Patient Instructions (Signed)
Continue on same dosage 1/2 tablet everyday except 1 tablet on Monday, Wednesday, and Friday. Recheck INR in 2 weeks.  Call with any new medications, any new procedures , and  with any concerns  [phone]

## 2017-05-24 DIAGNOSIS — N201 Calculus of ureter: Secondary | ICD-10-CM | POA: Diagnosis not present

## 2017-06-03 DIAGNOSIS — Z5181 Encounter for therapeutic drug level monitoring: Secondary | ICD-10-CM | POA: Diagnosis not present

## 2017-06-03 DIAGNOSIS — R609 Edema, unspecified: Secondary | ICD-10-CM | POA: Diagnosis not present

## 2017-06-07 ENCOUNTER — Ambulatory Visit (INDEPENDENT_AMBULATORY_CARE_PROVIDER_SITE_OTHER): Payer: Medicare Other | Admitting: *Deleted

## 2017-06-07 DIAGNOSIS — I872 Venous insufficiency (chronic) (peripheral): Secondary | ICD-10-CM | POA: Diagnosis not present

## 2017-06-07 DIAGNOSIS — C44722 Squamous cell carcinoma of skin of right lower limb, including hip: Secondary | ICD-10-CM | POA: Diagnosis not present

## 2017-06-07 DIAGNOSIS — Z5181 Encounter for therapeutic drug level monitoring: Secondary | ICD-10-CM

## 2017-06-07 DIAGNOSIS — I480 Paroxysmal atrial fibrillation: Secondary | ICD-10-CM | POA: Diagnosis not present

## 2017-06-07 DIAGNOSIS — L570 Actinic keratosis: Secondary | ICD-10-CM | POA: Diagnosis not present

## 2017-06-07 LAB — POCT INR: INR: 2.8

## 2017-06-07 NOTE — Patient Instructions (Signed)
Description   Continue on same dosage 1/2 tablet everyday except 1 tablet on Monday, Wednesday, and Friday. Recheck INR in 3 weeks.  Call with any new medications, any new procedures , and  with any concerns  [phone]

## 2017-06-10 DIAGNOSIS — Z5181 Encounter for therapeutic drug level monitoring: Secondary | ICD-10-CM | POA: Diagnosis not present

## 2017-06-16 ENCOUNTER — Telehealth: Payer: Self-pay | Admitting: Interventional Cardiology

## 2017-06-16 NOTE — Telephone Encounter (Signed)
Patient calling and states that she wanted to let Dr. Irish Lack know that Dr. Laurann Montana and Dr. Gloriann Loan (urologist) have both said it was okay for her to have a cardioversion for her Afib. She states that Dr. Gloriann Loan is not going to do the procedure anytime soon and states that she can move forward with her cardioversion. Patient wants to discuss this further at an office visit with Dr. Irish Lack. Patient scheduled on 06/28/17.

## 2017-06-16 NOTE — Telephone Encounter (Signed)
Will need to document 4 weeks of therapeutic INR prior to cardioversion or if symptomatic, could schedule for TEE/CV.

## 2017-06-16 NOTE — Telephone Encounter (Signed)
[name] [name] is calling because she is still suffering from AFIB and Dr.Varanasi wanted to know when Dr. Laurann Montana and Dr. Gloriann Loan said it was okay to shock her heart and they both says it okay . Please call . Thanks

## 2017-06-17 NOTE — Addendum Note (Signed)
Addended by: Drue Novel I on: 06/17/2017 11:46 AM   Modules accepted: Orders

## 2017-06-17 NOTE — Telephone Encounter (Signed)
Called and made patient aware that she will need 4 weeks of therapeutic INR checks prior to her cardioversion or have a TEE/DCCV if her symptoms have worsened. Patient states that her symptoms have not worsened and agrees to the 4 weeks of therapeutic INR check prior to DCCV. Patient scheduled in coumadin clinic for 12/31 at 2:30 PM and will keep already scheduled coumadin appointment for 06/28/17. The coumadin clinic will schedule her for the following 2 weekly checks after that. Patient verbalized understanding and was in agreement with the plan.

## 2017-06-17 NOTE — Telephone Encounter (Signed)
Left message for patient to call back  

## 2017-06-20 ENCOUNTER — Ambulatory Visit (INDEPENDENT_AMBULATORY_CARE_PROVIDER_SITE_OTHER): Payer: Medicare Other | Admitting: *Deleted

## 2017-06-20 DIAGNOSIS — Z5181 Encounter for therapeutic drug level monitoring: Secondary | ICD-10-CM | POA: Diagnosis not present

## 2017-06-20 DIAGNOSIS — I480 Paroxysmal atrial fibrillation: Secondary | ICD-10-CM | POA: Diagnosis not present

## 2017-06-20 LAB — POCT INR: INR: 2.2

## 2017-06-20 NOTE — Patient Instructions (Signed)
Description   Continue on same dosage 1/2 tablet everyday except 1 tablet on Monday, Wednesday, and Friday. Recheck INR in 1 week, pending DCCV.  Call with any new medications, any new procedures, and  with any concerns  [phone]

## 2017-06-22 NOTE — Telephone Encounter (Signed)
Called patient and made her aware we moved her appt with Dr. Irish Lack to 07/12/17 at 1:20 PM.

## 2017-06-24 DIAGNOSIS — H43813 Vitreous degeneration, bilateral: Secondary | ICD-10-CM | POA: Diagnosis not present

## 2017-06-24 DIAGNOSIS — H35363 Drusen (degenerative) of macula, bilateral: Secondary | ICD-10-CM | POA: Diagnosis not present

## 2017-06-24 DIAGNOSIS — H524 Presbyopia: Secondary | ICD-10-CM | POA: Diagnosis not present

## 2017-06-27 DIAGNOSIS — R609 Edema, unspecified: Secondary | ICD-10-CM | POA: Diagnosis not present

## 2017-06-28 ENCOUNTER — Ambulatory Visit: Payer: Medicare Other | Admitting: Interventional Cardiology

## 2017-06-28 ENCOUNTER — Ambulatory Visit (INDEPENDENT_AMBULATORY_CARE_PROVIDER_SITE_OTHER): Payer: Medicare Other | Admitting: Pharmacist

## 2017-06-28 DIAGNOSIS — I480 Paroxysmal atrial fibrillation: Secondary | ICD-10-CM

## 2017-06-28 DIAGNOSIS — Z5181 Encounter for therapeutic drug level monitoring: Secondary | ICD-10-CM

## 2017-06-28 LAB — POCT INR: INR: 2.2

## 2017-06-28 NOTE — Patient Instructions (Signed)
Description   Continue on same dosage 1/2 tablet everyday except 1 tablet on Monday, Wednesday, and Friday. Recheck INR in 1 week, pending DCCV.  Call with any new medications, any new procedures, and  with any concerns  [phone]

## 2017-06-29 ENCOUNTER — Other Ambulatory Visit: Payer: Self-pay | Admitting: Interventional Cardiology

## 2017-06-29 DIAGNOSIS — Z780 Asymptomatic menopausal state: Secondary | ICD-10-CM | POA: Diagnosis not present

## 2017-06-29 MED ORDER — METOPROLOL SUCCINATE ER 50 MG PO TB24: 100.0000 mg | ORAL_TABLET | Freq: Every day | ORAL | 3 refills | Status: DC

## 2017-06-29 NOTE — Telephone Encounter (Signed)
Pt's medication was sent to pt's pharmacy as requested. Confirmation received.  °

## 2017-07-05 ENCOUNTER — Ambulatory Visit (INDEPENDENT_AMBULATORY_CARE_PROVIDER_SITE_OTHER): Payer: Medicare Other | Admitting: Pharmacist

## 2017-07-05 ENCOUNTER — Telehealth: Payer: Self-pay | Admitting: *Deleted

## 2017-07-05 DIAGNOSIS — Z5181 Encounter for therapeutic drug level monitoring: Secondary | ICD-10-CM

## 2017-07-05 DIAGNOSIS — I480 Paroxysmal atrial fibrillation: Secondary | ICD-10-CM

## 2017-07-05 LAB — POCT INR: INR: 2.3

## 2017-07-05 MED ORDER — METOPROLOL SUCCINATE ER 100 MG PO TB24: 100.0000 mg | ORAL_TABLET | Freq: Every day | ORAL | 3 refills | Status: DC

## 2017-07-05 NOTE — Telephone Encounter (Signed)
Received a fax from [facility] requesting to change the metoprolol rx to a 100 mg tab which would be more cost effective for the patient. Okay to make this change? Please advise. Thanks, MI

## 2017-07-05 NOTE — Patient Instructions (Signed)
Description   Continue on same dosage 1/2 tablet everyday except 1 tablet on Monday, Wednesday, and Friday. Recheck INR in 1 week, pending DCCV.  Call with any new medications, any new procedures, and  with any concerns  [phone]

## 2017-07-05 NOTE — Telephone Encounter (Signed)
New Rx for metoprolol succinate 100 mg QD sent to [facility].

## 2017-07-11 NOTE — Progress Notes (Signed)
Cardiology Office Note   Date:  07/12/2017   ID:  Sierra, [name] [birth year], MRN [id number]  PCP:  Lavone Orn, MD    No chief complaint on file.  AFib  Wt Readings from Last 3 Encounters:  07/12/17 198 lb 1.9 oz (89.9 kg)  05/10/17 207 lb 12.8 oz (94.3 kg)  04/19/17 196 lb 6.4 oz (89.1 kg)       History of Present Illness: Sierra [name] is a 72 y.o. female  Who has HTN, PACs for many years. In September2016, she was feeling palpitations thought to be from PACs. In December 2016, she had a severe GI bug and went to the ER. She had atrial fibrillation diagnosed in the ambulance.  She had a complicated urinary infection in 2018. SHe was hospitalized in evaluated with urology. She had a high fever. She may need an additional urologic procedure to help.   She was started on Coumadin. SHe is not interested in a DOAC.  Recent monitor showed AFib with RVR.  No urology procedures were to be scheduled soon so we decided to pursue DCCV due to recurrent sx.   Convesation with nurse as follows at the end of 2018: "Called and made patient aware that she will need 4 weeks of therapeutic INR checks prior to her cardioversion or have a TEE/DCCV if her symptoms have worsened. Patient states that her symptoms have not worsened and agrees to the 4 weeks of therapeutic INR check prior to DCCV. Patient scheduled in coumadin clinic for 12/31 at 2:30 PM and will keep already scheduled coumadin appointment for 06/28/17. The coumadin clinic will schedule her for the following 2 weekly checks after that. Patient verbalized understanding and was in agreement with the plan."  Feels ok now.  Denies : Chest pain. Dizziness. Leg edema. Nitroglycerin use. Orthopnea. Palpitations. Paroxysmal nocturnal dyspnea. Shortness of breath. Syncope.   Past Medical History:  Diagnosis Date  . Anxiety   . Arthritis    "joints" (09/19/2013)  . Atrial fibrillation ([place])   . Basal cell carcinoma of cheek     left  . Chronic lower back pain   . GERD (gastroesophageal reflux disease)   . H/O hiatal hernia   . Hypertension   . Migraine    "haven't had any in a long time" (09/19/2013)  . Pneumonia ~ 2012    Past Surgical History:  Procedure Laterality Date  . BASAL CELL CARCINOMA EXCISION Left    "cheek"  . CHOLECYSTECTOMY  1990's  . COLONOSCOPY WITH PROPOFOL N/A 07/05/2016   Procedure: COLONOSCOPY WITH PROPOFOL;  Surgeon: Garlan Fair, MD;  Location: [facility];  Service: Endoscopy;  Laterality: N/A;  . DILATION AND CURETTAGE OF UTERUS    . HERNIA REPAIR  6948   umbilical  . JOINT REPLACEMENT    . OOPHORECTOMY Bilateral   . REVISION TOTAL HIP ARTHROPLASTY Left 09/19/2013  . SHOULDER ARTHROSCOPY W/ ROTATOR CUFF REPAIR Right   . TMJ ARTHROPLASTY    . TOTAL HIP ARTHROPLASTY Left 2009  . TOTAL HIP ARTHROPLASTY Right 2011  . TOTAL HIP REVISION Left 09/19/2013   Procedure: LEFT TOTAL HIP REVISION;  Surgeon: Kerin Salen, MD;  Location: [place];  Service: Orthopedics;  Laterality: Left;  . TUBAL LIGATION    . VAGINAL HYSTERECTOMY  1990's     Current Outpatient Medications  Medication Sig Dispense Refill  . amLODipine (NORVASC) 10 MG tablet Take 10 mg by mouth daily.    . Cholecalciferol (VITAMIN  D-3) 5000 UNITS TABS Take 5,000 Units by mouth every Monday, Wednesday, and Friday.     . citalopram (CELEXA) 40 MG tablet Take 0.5 tablets (20 mg total) by mouth daily.    . COLESTID 5 g packet Take 5 g by mouth 2 (two) times daily.   3  . fexofenadine (ALLEGRA) 180 MG tablet Take 180 mg by mouth daily.    . Glucomannan 1000 MG CAPS Take 1,000 mg by mouth daily.    Marland Kitchen glucosamine-chondroitin 500-400 MG tablet Take 1 tablet by mouth daily.    Marland Kitchen MAGNESIUM CITRATE PO Take 250 mg daily by mouth.    . metolazone (ZAROXOLYN) 5 MG tablet Take 5 mg by mouth daily as needed.  0  . metoprolol succinate (TOPROL-XL) 100 MG 24 hr tablet Take 1 tablet (100 mg total) by mouth daily. Take with or immediately  following a meal. 90 tablet 3  . metoprolol succinate (TOPROL-XL) 100 MG 24 hr tablet Take 100 mg by mouth 2 (two) times daily. Take with or immediately following a meal.    . Multiple Vitamins-Minerals (MULTIVITAMIN WITH MINERALS) tablet Take 1 tablet by mouth daily. 50+ for her    . nabumetone (RELAFEN) 500 MG tablet Take 500 mg by mouth 2 (two) times daily. Will stop prior to procedure    . potassium chloride (K-DUR,KLOR-CON) 10 MEQ tablet Take 10 mEq by mouth daily.    . Probiotic Product (ALIGN) 4 MG CAPS Take 1 capsule by mouth daily.    . RABEprazole (ACIPHEX) 20 MG tablet Take 20 mg by mouth daily.     Marland Kitchen terazosin (HYTRIN) 10 MG capsule Take 10 mg by mouth at bedtime.    . torsemide (DEMADEX) 20 MG tablet Take 20 mg by mouth daily.    . valsartan (DIOVAN) 160 MG tablet Take 1 tablet (160 mg total) by mouth daily. 90 tablet 3  . vitamin B-12 (CYANOCOBALAMIN) 500 MCG tablet Take 500 mcg by mouth every Monday, Wednesday, and Friday.     . vitamin C (ASCORBIC ACID) 250 MG tablet Take 250 mg by mouth 3 (three) times a week.     . warfarin (COUMADIN) 5 MG tablet Take 1 tablet daily as directed by Coumadin clinic 90 tablet 0   No current facility-administered medications for this visit.     Allergies:   Ace inhibitors; Codeine; Erythromycin; and Iohexol    Social History:  The patient  reports that  has never smoked. she has never used smokeless tobacco. She reports that she drinks alcohol. She reports that she does not use drugs.   Family History:  The patient's family history includes Heart attack in her maternal aunt, mother, and paternal uncle.    ROS:  Please see the history of present illness.   Otherwise, review of systems are positive for easy to manage COumadin of late.   All other systems are reviewed and negative.    PHYSICAL EXAM: VS:  BP 120/70   Pulse 73   Ht 5\' 7"  (1.702 m)   Wt 198 lb 1.9 oz (89.9 kg)   SpO2 98%   BMI 31.03 kg/m  , BMI Body mass index is 31.03  kg/m. GEN: Well nourished, well developed, in no acute distress  HEENT: normal  Neck: no JVD, carotid bruits, or masses Cardiac: RRR; no murmurs, rubs, or gallops,no edema  Respiratory:  clear to auscultation bilaterally, normal work of breathing GI: soft, nontender, nondistended, + BS MS: no deformity or atrophy  Skin: warm  and dry, no rash Neuro:  Strength and sensation are intact Psych: euthymic mood, full affect   EKG:   The ekg ordered today demonstrates AFib rate controlled   Recent Labs: 04/07/2017: B Natriuretic Peptide 472.8 04/08/2017: Magnesium 1.8 04/11/2017: BUN 17; Creatinine, Ser 0.88; Hemoglobin 9.7; Platelets 215; Potassium 3.5; Sodium 143   Lipid Panel No results found for: CHOL, TRIG, HDL, CHOLHDL, VLDL, LDLCALC, LDLDIRECT   Other studies Reviewed: Additional studies/ records that were reviewed today with results demonstrating: .   ASSESSMENT AND PLAN:  1. AFib: Coumadin for stroke prevention.  The risks and benefits of cardioversion were explained to the patient and she is willing to proceed.  She has had 4 consecutive INRs which are therapeutic.  She does not need transesophageal echocardiogram.  INR today was 2.6.  Metoprolol may need to be decreased after cardioversion depending on her heart rate. 2. Chronic diastolic heart failure: She appears euvolemic.  Continue current medicines. 3. Leg edema: Continue compression stockings. 4. HTN: Blood pressure well controlled.   Current medicines are reviewed at length with the patient today.  The patient concerns regarding her medicines were addressed.  The following changes have been made:  No change  Labs/ tests ordered today include:   Orders Placed This Encounter  Procedures  . Basic metabolic panel  . CBC  . Protime-INR  . EKG 12-Lead    Recommend 150 minutes/week of aerobic exercise Low fat, low carb, high fiber diet recommended  Disposition:   FU in 3 weeks   Signed, Larae Grooms,  MD  07/12/2017 2:16 PM    [facility] [place], [place], [place]  [address] Phone: [phone]; Fax: [phone]

## 2017-07-11 NOTE — H&P (View-Only) (Signed)
Cardiology Office Note   Date:  07/12/2017   ID:  Sierra [name], Sierra [name] 1946-02-25, MRN [id number]  PCP:  Sierra Orn, MD    No chief complaint on file.  AFib  Wt Readings from Last 3 Encounters:  07/12/17 198 lb 1.9 oz (89.9 kg)  05/10/17 207 lb 12.8 oz (94.3 kg)  04/19/17 196 lb 6.4 oz (89.1 kg)       History of Present Illness: Sierra [name] is a 72 y.o. female  Who has HTN, PACs for many years. In September2016, she was feeling palpitations thought to be from PACs. In December 2016, she had a severe GI bug and went to the ER. She had atrial fibrillation diagnosed in the ambulance.  She had a complicated urinary infection in 2018. SHe was hospitalized in evaluated with urology. She had a high fever. She may need an additional urologic procedure to help.   She was started on Coumadin. SHe is not interested in a DOAC.  Recent monitor showed AFib with RVR.  No urology procedures were to be scheduled soon so we decided to pursue DCCV due to recurrent sx.   Convesation with nurse as follows at the end of 2018: "Called and made patient aware that she will need 4 weeks of therapeutic INR checks prior to her cardioversion or have a TEE/DCCV if her symptoms have worsened. Patient states that her symptoms have not worsened and agrees to the 4 weeks of therapeutic INR check prior to DCCV. Patient scheduled in coumadin clinic for 12/31 at 2:30 PM and will keep already scheduled coumadin appointment for 06/28/17. The coumadin clinic will schedule her for the following 2 weekly checks after that. Patient verbalized understanding and was in agreement with the plan."  Feels ok now.  Denies : Chest pain. Dizziness. Leg edema. Nitroglycerin use. Orthopnea. Palpitations. Paroxysmal nocturnal dyspnea. Shortness of breath. Syncope.   Past Medical History:  Diagnosis Date  . Anxiety   . Arthritis    "joints" (09/19/2013)  . Atrial fibrillation ([place])   . Basal cell carcinoma of cheek     left  . Chronic lower back pain   . GERD (gastroesophageal reflux disease)   . H/O hiatal hernia   . Hypertension   . Migraine    "haven't had any in a long time" (09/19/2013)  . Pneumonia ~ 2012    Past Surgical History:  Procedure Laterality Date  . BASAL CELL CARCINOMA EXCISION Left    "cheek"  . CHOLECYSTECTOMY  1990's  . COLONOSCOPY WITH PROPOFOL N/A 07/05/2016   Procedure: COLONOSCOPY WITH PROPOFOL;  Surgeon: Sierra Fair, MD;  Location: [facility];  Service: Endoscopy;  Laterality: N/A;  . DILATION AND CURETTAGE OF UTERUS    . HERNIA REPAIR  5465   umbilical  . JOINT REPLACEMENT    . OOPHORECTOMY Bilateral   . REVISION TOTAL HIP ARTHROPLASTY Left 09/19/2013  . SHOULDER ARTHROSCOPY W/ ROTATOR CUFF REPAIR Right   . TMJ ARTHROPLASTY    . TOTAL HIP ARTHROPLASTY Left 2009  . TOTAL HIP ARTHROPLASTY Right 2011  . TOTAL HIP REVISION Left 09/19/2013   Procedure: LEFT TOTAL HIP REVISION;  Surgeon: Sierra Salen, MD;  Location: [place];  Service: Orthopedics;  Laterality: Left;  . TUBAL LIGATION    . VAGINAL HYSTERECTOMY  1990's     Current Outpatient Medications  Medication Sig Dispense Refill  . amLODipine (NORVASC) 10 MG tablet Take 10 mg by mouth daily.    . Cholecalciferol (VITAMIN  D-3) 5000 UNITS TABS Take 5,000 Units by mouth every Monday, Wednesday, and Friday.     . citalopram (CELEXA) 40 MG tablet Take 0.5 tablets (20 mg total) by mouth daily.    . COLESTID 5 g packet Take 5 g by mouth 2 (two) times daily.   3  . fexofenadine (ALLEGRA) 180 MG tablet Take 180 mg by mouth daily.    . Glucomannan 1000 MG CAPS Take 1,000 mg by mouth daily.    Marland Kitchen glucosamine-chondroitin 500-400 MG tablet Take 1 tablet by mouth daily.    Marland Kitchen MAGNESIUM CITRATE PO Take 250 mg daily by mouth.    . metolazone (ZAROXOLYN) 5 MG tablet Take 5 mg by mouth daily as needed.  0  . metoprolol succinate (TOPROL-XL) 100 MG 24 hr tablet Take 1 tablet (100 mg total) by mouth daily. Take with or immediately  following a meal. 90 tablet 3  . metoprolol succinate (TOPROL-XL) 100 MG 24 hr tablet Take 100 mg by mouth 2 (two) times daily. Take with or immediately following a meal.    . Multiple Vitamins-Minerals (MULTIVITAMIN WITH MINERALS) tablet Take 1 tablet by mouth daily. 50+ for her    . nabumetone (RELAFEN) 500 MG tablet Take 500 mg by mouth 2 (two) times daily. Will stop prior to procedure    . potassium chloride (K-DUR,KLOR-CON) 10 MEQ tablet Take 10 mEq by mouth daily.    . Probiotic Product (ALIGN) 4 MG CAPS Take 1 capsule by mouth daily.    . RABEprazole (ACIPHEX) 20 MG tablet Take 20 mg by mouth daily.     Marland Kitchen terazosin (HYTRIN) 10 MG capsule Take 10 mg by mouth at bedtime.    . torsemide (DEMADEX) 20 MG tablet Take 20 mg by mouth daily.    . valsartan (DIOVAN) 160 MG tablet Take 1 tablet (160 mg total) by mouth daily. 90 tablet 3  . vitamin B-12 (CYANOCOBALAMIN) 500 MCG tablet Take 500 mcg by mouth every Monday, Wednesday, and Friday.     . vitamin C (ASCORBIC ACID) 250 MG tablet Take 250 mg by mouth 3 (three) times a week.     . warfarin (COUMADIN) 5 MG tablet Take 1 tablet daily as directed by Coumadin clinic 90 tablet 0   No current facility-administered medications for this visit.     Allergies:   Ace inhibitors; Codeine; Erythromycin; and Iohexol    Social History:  The patient  reports that  has never smoked. she has never used smokeless tobacco. She reports that she drinks alcohol. She reports that she does not use drugs.   Family History:  The patient's family history includes Heart attack in her maternal aunt, mother, and paternal uncle.    ROS:  Please see the history of present illness.   Otherwise, review of systems are positive for easy to manage COumadin of late.   All other systems are reviewed and negative.    PHYSICAL EXAM: VS:  BP 120/70   Pulse 73   Ht 5\' 7"  (1.702 m)   Wt 198 lb 1.9 oz (89.9 kg)   SpO2 98%   BMI 31.03 kg/m  , BMI Body mass index is 31.03  kg/m. GEN: Well nourished, well developed, in no acute distress  HEENT: normal  Neck: no JVD, carotid bruits, or masses Cardiac: RRR; no murmurs, rubs, or gallops,no edema  Respiratory:  clear to auscultation bilaterally, normal work of breathing GI: soft, nontender, nondistended, + BS MS: no deformity or atrophy  Skin: warm  and dry, no rash Neuro:  Strength and sensation are intact Psych: euthymic mood, full affect   EKG:   The ekg ordered today demonstrates AFib rate controlled   Recent Labs: 04/07/2017: B Natriuretic Peptide 472.8 04/08/2017: Magnesium 1.8 04/11/2017: BUN 17; Creatinine, Ser 0.88; Hemoglobin 9.7; Platelets 215; Potassium 3.5; Sodium 143   Lipid Panel No results found for: CHOL, TRIG, HDL, CHOLHDL, VLDL, LDLCALC, LDLDIRECT   Other studies Reviewed: Additional studies/ records that were reviewed today with results demonstrating: .   ASSESSMENT AND PLAN:  1. AFib: Coumadin for stroke prevention.  The risks and benefits of cardioversion were explained to the patient and she is willing to proceed.  She has had 4 consecutive INRs which are therapeutic.  She does not need transesophageal echocardiogram.  INR today was 2.6.  Metoprolol may need to be decreased after cardioversion depending on her heart rate. 2. Chronic diastolic heart failure: She appears euvolemic.  Continue current medicines. 3. Leg edema: Continue compression stockings. 4. HTN: Blood pressure well controlled.   Current medicines are reviewed at length with the patient today.  The patient concerns regarding her medicines were addressed.  The following changes have been made:  No change  Labs/ tests ordered today include:   Orders Placed This Encounter  Procedures  . Basic metabolic panel  . CBC  . Protime-INR  . EKG 12-Lead    Recommend 150 minutes/week of aerobic exercise Low fat, low carb, high fiber diet recommended  Disposition:   FU in 3 weeks   Signed, Larae Grooms,  MD  07/12/2017 2:16 PM    [facility] [place], [place], [place]  [address] Phone: [phone]; Fax: [phone]

## 2017-07-12 ENCOUNTER — Other Ambulatory Visit: Payer: Self-pay | Admitting: Interventional Cardiology

## 2017-07-12 ENCOUNTER — Encounter: Payer: Self-pay | Admitting: Interventional Cardiology

## 2017-07-12 ENCOUNTER — Ambulatory Visit (INDEPENDENT_AMBULATORY_CARE_PROVIDER_SITE_OTHER): Payer: Medicare Other

## 2017-07-12 ENCOUNTER — Ambulatory Visit (INDEPENDENT_AMBULATORY_CARE_PROVIDER_SITE_OTHER): Payer: Medicare Other | Admitting: Interventional Cardiology

## 2017-07-12 VITALS — BP 120/70 | HR 73 | Ht 67.0 in | Wt 198.1 lb

## 2017-07-12 DIAGNOSIS — I5032 Chronic diastolic (congestive) heart failure: Secondary | ICD-10-CM

## 2017-07-12 DIAGNOSIS — Z5181 Encounter for therapeutic drug level monitoring: Secondary | ICD-10-CM | POA: Diagnosis not present

## 2017-07-12 DIAGNOSIS — I480 Paroxysmal atrial fibrillation: Secondary | ICD-10-CM

## 2017-07-12 DIAGNOSIS — R600 Localized edema: Secondary | ICD-10-CM | POA: Diagnosis not present

## 2017-07-12 DIAGNOSIS — Z7901 Long term (current) use of anticoagulants: Secondary | ICD-10-CM | POA: Diagnosis not present

## 2017-07-12 DIAGNOSIS — I10 Essential (primary) hypertension: Secondary | ICD-10-CM

## 2017-07-12 DIAGNOSIS — I4819 Other persistent atrial fibrillation: Secondary | ICD-10-CM

## 2017-07-12 LAB — POCT INR: INR: 2.6

## 2017-07-12 NOTE — Patient Instructions (Signed)
Description   Continue on same dosage 1/2 tablet everyday except 1 tablet on Mondays, Wednesdays, and Fridays. Recheck INR in 1 week after cardioversion.  Call with any new medications, any new procedures, and  with any concerns  [phone]

## 2017-07-12 NOTE — Patient Instructions (Addendum)
Medication Instructions:  Your physician recommends that you continue on your current medications as directed. Please refer to the Current Medication list given to you today.   Labwork: TODAY: CBC, BMET, PT/INR  Testing/Procedures: Your physician has recommended that you have a Cardioversion (DCCV) on 07/15/17. Electrical Cardioversion uses a jolt of electricity to your heart either through paddles or wired patches attached to your chest. This is a controlled, usually prescheduled, procedure. Defibrillation is done under light anesthesia in the hospital, and you usually go home the day of the procedure. This is done to get your heart back into a normal rhythm. You are not awake for the procedure. Please see the instruction sheet given to you today.   Follow-Up: Your physician recommends that you schedule a follow-up appointment 3 weeks after cardioversion    Any Other Special Instructions Will Be Listed Below (If Applicable).  [name], [name] are scheduled for a Cardioversion on Friday January 25th with Dr. Acie Fredrickson.  Please arrive at the [facility] (Main Entrance A) at [facility]: [address] [place], [place] [address] at 11:00 am. (1 hour prior to procedure)  DIET: Nothing to eat or drink after midnight except a sip of water with medications (see medication instructions below)  Medication Instructions:   Hold your torsemide and metolazone the morning of your procedure  Continue your anticoagulant: Coumadin You will need to continue your anticoagulant after your procedure until you are told by your Provider  that it is safe to stop   Labs: TODAY: CBC, BMET, PT/INR  You must have a responsible person to drive you home and stay in the waiting area during your procedure. Failure to do so could result in cancellation.  Bring your insurance cards.  *Special Note: Every effort is made to have your procedure done on time. Occasionally there are emergencies that occur at the  hospital that may cause delays. Please be patient if a delay does occur.     If you need a refill on your cardiac medications before your next appointment, please call your pharmacy.

## 2017-07-13 LAB — BASIC METABOLIC PANEL
BUN/Creatinine Ratio: 43 — ABNORMAL HIGH (ref 12–28)
BUN: 47 mg/dL — ABNORMAL HIGH (ref 8–27)
CO2: 27 mmol/L (ref 20–29)
Calcium: 9.3 mg/dL (ref 8.7–10.3)
Chloride: 100 mmol/L (ref 96–106)
Creatinine, Ser: 1.09 mg/dL — ABNORMAL HIGH (ref 0.57–1.00)
GFR calc Af Amer: 58 mL/min/{1.73_m2} — ABNORMAL LOW (ref >59)
GFR, EST NON AFRICAN AMERICAN: 50 mL/min/{1.73_m2} — AB (ref >59)
GLUCOSE: 97 mg/dL (ref 65–99)
POTASSIUM: 3.9 mmol/L (ref 3.5–5.2)
SODIUM: 142 mmol/L (ref 134–144)

## 2017-07-13 LAB — CBC
Hematocrit: 31.1 % — ABNORMAL LOW (ref 34.0–46.6)
Hemoglobin: 10.3 g/dL — ABNORMAL LOW (ref 11.1–15.9)
MCH: 28.2 pg (ref 26.6–33.0)
MCHC: 33.1 g/dL (ref 31.5–35.7)
MCV: 85 fL (ref 79–97)
PLATELETS: 154 10*3/uL (ref 150–379)
RBC: 3.65 x10E6/uL — ABNORMAL LOW (ref 3.77–5.28)
RDW: 14.5 % (ref 12.3–15.4)
WBC: 4.7 10*3/uL (ref 3.4–10.8)

## 2017-07-13 LAB — PROTIME-INR
INR: 2.2 — AB (ref 0.8–1.2)
Prothrombin Time: 22.2 s — ABNORMAL HIGH (ref 9.1–12.0)

## 2017-07-15 ENCOUNTER — Ambulatory Visit (HOSPITAL_COMMUNITY): Payer: Medicare Other | Admitting: Anesthesiology

## 2017-07-15 ENCOUNTER — Ambulatory Visit (HOSPITAL_COMMUNITY)
Admission: RE | Admit: 2017-07-15 | Discharge: 2017-07-15 | Disposition: A | Discharge status: Home / Self Care | Payer: Medicare Other | Source: Ambulatory Visit | Attending: Cardiovascular Disease | Admitting: Cardiovascular Disease

## 2017-07-15 ENCOUNTER — Encounter (HOSPITAL_COMMUNITY): Payer: Self-pay | Admitting: Certified Registered Nurse Anesthetist

## 2017-07-15 ENCOUNTER — Encounter (HOSPITAL_COMMUNITY)
Admission: RE | Disposition: A | Discharge status: Home / Self Care | Payer: Self-pay | Source: Ambulatory Visit | Attending: Cardiovascular Disease

## 2017-07-15 ENCOUNTER — Other Ambulatory Visit: Payer: Self-pay

## 2017-07-15 DIAGNOSIS — F329 Major depressive disorder, single episode, unspecified: Secondary | ICD-10-CM | POA: Insufficient documentation

## 2017-07-15 DIAGNOSIS — F419 Anxiety disorder, unspecified: Secondary | ICD-10-CM | POA: Insufficient documentation

## 2017-07-15 DIAGNOSIS — K219 Gastro-esophageal reflux disease without esophagitis: Secondary | ICD-10-CM | POA: Diagnosis not present

## 2017-07-15 DIAGNOSIS — Z96643 Presence of artificial hip joint, bilateral: Secondary | ICD-10-CM | POA: Insufficient documentation

## 2017-07-15 DIAGNOSIS — I5032 Chronic diastolic (congestive) heart failure: Secondary | ICD-10-CM | POA: Insufficient documentation

## 2017-07-15 DIAGNOSIS — I480 Paroxysmal atrial fibrillation: Secondary | ICD-10-CM | POA: Diagnosis not present

## 2017-07-15 DIAGNOSIS — I4819 Other persistent atrial fibrillation: Secondary | ICD-10-CM

## 2017-07-15 DIAGNOSIS — Z7901 Long term (current) use of anticoagulants: Secondary | ICD-10-CM | POA: Diagnosis not present

## 2017-07-15 DIAGNOSIS — I110 Hypertensive heart disease with heart failure: Secondary | ICD-10-CM | POA: Diagnosis not present

## 2017-07-15 DIAGNOSIS — E785 Hyperlipidemia, unspecified: Secondary | ICD-10-CM | POA: Diagnosis not present

## 2017-07-15 DIAGNOSIS — I481 Persistent atrial fibrillation: Secondary | ICD-10-CM | POA: Diagnosis not present

## 2017-07-15 DIAGNOSIS — I4891 Unspecified atrial fibrillation: Secondary | ICD-10-CM | POA: Insufficient documentation

## 2017-07-15 DIAGNOSIS — I10 Essential (primary) hypertension: Secondary | ICD-10-CM | POA: Diagnosis not present

## 2017-07-15 DIAGNOSIS — Z85828 Personal history of other malignant neoplasm of skin: Secondary | ICD-10-CM | POA: Insufficient documentation

## 2017-07-15 DIAGNOSIS — Z791 Long term (current) use of non-steroidal anti-inflammatories (NSAID): Secondary | ICD-10-CM | POA: Insufficient documentation

## 2017-07-15 DIAGNOSIS — I071 Rheumatic tricuspid insufficiency: Secondary | ICD-10-CM | POA: Insufficient documentation

## 2017-07-15 DIAGNOSIS — R600 Localized edema: Secondary | ICD-10-CM | POA: Diagnosis not present

## 2017-07-15 DIAGNOSIS — Z79899 Other long term (current) drug therapy: Secondary | ICD-10-CM | POA: Insufficient documentation

## 2017-07-15 DIAGNOSIS — Z8249 Family history of ischemic heart disease and other diseases of the circulatory system: Secondary | ICD-10-CM | POA: Diagnosis not present

## 2017-07-15 DIAGNOSIS — F418 Other specified anxiety disorders: Secondary | ICD-10-CM | POA: Diagnosis not present

## 2017-07-15 HISTORY — PX: CARDIOVERSION: SHX1299

## 2017-07-15 SURGERY — CARDIOVERSION
Anesthesia: General

## 2017-07-15 MED ORDER — SODIUM CHLORIDE 0.9 % IV SOLN
INTRAVENOUS | Status: DC | PRN
  Administered 2017-07-15: 11:00:00 via INTRAVENOUS

## 2017-07-15 MED ORDER — LIDOCAINE 2% (20 MG/ML) 5 ML SYRINGE
INTRAMUSCULAR | Status: DC | PRN
  Administered 2017-07-15: 40 mg via INTRAVENOUS

## 2017-07-15 MED ORDER — PROPOFOL 10 MG/ML IV BOLUS
INTRAVENOUS | Status: DC | PRN
  Administered 2017-07-15: 50 mg via INTRAVENOUS

## 2017-07-15 MED ORDER — METOPROLOL SUCCINATE ER 100 MG PO TB24: 100.0000 mg | ORAL_TABLET | Freq: Every day | ORAL | 3 refills | Status: DC

## 2017-07-15 NOTE — Anesthesia Postprocedure Evaluation (Signed)
Anesthesia Post Note  Patient: Sierra [name]  Procedure(s) Performed: CARDIOVERSION (N/A )     Patient location during evaluation: PACU Anesthesia Type: General Level of consciousness: awake and alert Pain management: pain level controlled Vital Signs Assessment: post-procedure vital signs reviewed and stable Respiratory status: spontaneous breathing, nonlabored ventilation, respiratory function stable and patient connected to nasal cannula oxygen Cardiovascular status: blood pressure returned to baseline and stable Postop Assessment: no apparent nausea or vomiting Anesthetic complications: no    Last Vitals:  Vitals:   07/15/17 1144 07/15/17 1159  BP: (!) 155/61 (!) 146/62  Pulse: 64   Resp: 16 15  Temp: 36.7 C   SpO2: 95% 96%    Last Pain:  Vitals:   07/15/17 1144  TempSrc: Oral                 Effie Berkshire

## 2017-07-15 NOTE — CV Procedure (Signed)
    Cardioversion Note  MELENIE MINNIEAR [id number] 1946-03-29  Procedure: DC Cardioversion Indications: atrial fib   Procedure Details Consent: Obtained Time Out: Verified patient identification, verified procedure, site/side was marked, verified correct patient position, special equipment/implants available, Radiology Safety Procedures followed,  medications/allergies/relevent history reviewed, required imaging and test results available.  Performed  The patient has been on adequate anticoagulation.  The patient received Lidocaine 40 mg followed by Propofol 50 mg  for sedation.  Synchronous cardioversion was performed at 120 J, 200  joules.  The cardioversion was successful     Complications: No apparent complications Patient did tolerate procedure well.   Thayer Headings, Brooke Bonito., MD, [facility] 07/15/2017, 11:37 AM

## 2017-07-15 NOTE — Anesthesia Preprocedure Evaluation (Addendum)
Anesthesia Evaluation  Patient identified by MRN, date of birth, ID band Patient awake    Reviewed: Allergy & Precautions, NPO status , Patient's Chart, lab work & pertinent test results  Airway Mallampati: I  TM Distance: >3 FB Neck ROM: Full    Dental  (+) Teeth Intact, Dental Advisory Given   Pulmonary pneumonia,    breath sounds clear to auscultation       Cardiovascular hypertension, + dysrhythmias Atrial Fibrillation  Rhythm:Irregular Rate:Abnormal     Neuro/Psych  Headaches, PSYCHIATRIC DISORDERS Anxiety Depression    GI/Hepatic Neg liver ROS, hiatal hernia, GERD  ,  Endo/Other  negative endocrine ROS  Renal/GU negative Renal ROS     Musculoskeletal  (+) Arthritis ,   Abdominal Normal abdominal exam  (+)   Peds  Hematology negative hematology ROS (+)   Anesthesia Other Findings   Reproductive/Obstetrics                           Echo: - Left ventricle: The cavity size was normal. There was mild   concentric hypertrophy. Systolic function was normal. The   estimated ejection fraction was in the range of 55% to 60%. Wall   motion was normal; there were no regional wall motion   abnormalities. - Aortic valve: Transvalvular velocity was within the normal range.   There was no stenosis. There was no regurgitation. - Mitral valve: Transvalvular velocity was within the normal range.   There was no evidence for stenosis. There was mild regurgitation. - Left atrium: The atrium was severely dilated. - Right ventricle: The cavity size was normal. Wall thickness was   normal. Systolic function was normal. - Right atrium: The atrium was moderately dilated. - Tricuspid valve: There was mild regurgitation. - Pulmonary arteries: Systolic pressure was mildly increased. PA   peak pressure: 48 mm Hg (S).  Anesthesia Physical Anesthesia Plan  ASA: III  Anesthesia Plan: General   Post-op Pain  Management:    Induction: Intravenous  PONV Risk Score and Plan: 2 and Treatment may vary due to age or medical condition  Airway Management Planned: Mask  Additional Equipment: None  Intra-op Plan:   Post-operative Plan:   Informed Consent: I have reviewed the patients History and Physical, chart, labs and discussed the procedure including the risks, benefits and alternatives for the proposed anesthesia with the patient or authorized representative who has indicated his/her understanding and acceptance.     Plan Discussed with: CRNA  Anesthesia Plan Comments:         Anesthesia Quick Evaluation

## 2017-07-15 NOTE — Interval H&P Note (Signed)
History and Physical Interval Note:  07/15/2017 11:21 AM  Sierra [name]  has presented today for surgery, with the diagnosis of A-FIB  The various methods of treatment have been discussed with the patient and family. After consideration of risks, benefits and other options for treatment, the patient has consented to  Procedure(s): CARDIOVERSION (N/A) as a surgical intervention .  The patient's history has been reviewed, patient examined, no change in status, stable for surgery.  I have reviewed the patient's chart and labs.  Questions were answered to the patient's satisfaction.     [name]

## 2017-07-15 NOTE — Transfer of Care (Signed)
Immediate Anesthesia Transfer of Care Note  Patient: Sierra [name]  Procedure(s) Performed: CARDIOVERSION (N/A )  Patient Location: Endoscopy Unit  Anesthesia Type:General  Level of Consciousness: awake, alert  and patient cooperative  Airway & Oxygen Therapy: Patient Spontanous Breathing  Post-op Assessment: Report given to RN and Post -op Vital signs reviewed and stable  Post vital signs: Reviewed and stable  Last Vitals:  Vitals:   07/15/17 1120 07/15/17 1125  BP: (!) 163/75   Pulse: 74   Resp: 19   Temp: 36.7 C   SpO2: 96% 97%    Last Pain:  Vitals:   07/15/17 1120  TempSrc: Oral         Complications: No apparent anesthesia complications

## 2017-07-15 NOTE — Interval H&P Note (Signed)
History and Physical Interval Note:  07/15/2017 11:22 AM  Sierra [name]  has presented today for surgery, with the diagnosis of A-FIB  The various methods of treatment have been discussed with the patient and family. After consideration of risks, benefits and other options for treatment, the patient has consented to  Procedure(s): CARDIOVERSION (N/A) as a surgical intervention .  The patient's history has been reviewed, patient examined, no change in status, stable for surgery.  I have reviewed the patient's chart and labs.  Questions were answered to the patient's satisfaction.     [name]

## 2017-07-15 NOTE — Anesthesia Procedure Notes (Signed)
Procedure Name: General with mask airway Date/Time: 07/15/2017 11:31 AM Performed by: White, Amedeo Plenty, CRNA Pre-anesthesia Checklist: Patient identified, Emergency Drugs available, Suction available, Patient being monitored and Timeout performed Patient Re-evaluated:Patient Re-evaluated prior to induction Oxygen Delivery Method: Ambu bag Preoxygenation: Pre-oxygenation with 100% oxygen

## 2017-07-15 NOTE — Discharge Instructions (Signed)
Electrical Cardioversion, Care After °This sheet gives you information about how to care for yourself after your procedure. Your health care provider may also give you more specific instructions. If you have problems or questions, contact your health care provider. °What can I expect after the procedure? °After the procedure, it is common to have: °· Some redness on the skin where the shocks were given. ° °Follow these instructions at home: °· Do not drive for 24 hours if you were given a medicine to help you relax (sedative). °· Take over-the-counter and prescription medicines only as told by your health care provider. °· Ask your health care provider how to check your pulse. Check it often. °· Rest for 48 hours after the procedure or as told by your health care provider. °· Avoid or limit your caffeine use as told by your health care provider. °Contact a health care provider if: °· You feel like your heart is beating too quickly or your pulse is not regular. °· You have a serious muscle cramp that does not go away. °Get help right away if: °· You have discomfort in your chest. °· You are dizzy or you feel faint. °· You have trouble breathing or you are short of breath. °· Your speech is slurred. °· You have trouble moving an arm or leg on one side of your body. °· Your fingers or toes turn cold or blue. °This information is not intended to replace advice given to you by your health care provider. Make sure you discuss any questions you have with your health care provider. °Document Released: 03/28/2013 Document Revised: 01/09/2016 Document Reviewed: 12/12/2015 °Elsevier Interactive Patient Education © 2018 Elsevier Inc. ° °

## 2017-07-17 ENCOUNTER — Encounter (HOSPITAL_COMMUNITY): Payer: Self-pay | Admitting: Cardiovascular Disease

## 2017-07-20 ENCOUNTER — Ambulatory Visit: Payer: Medicare Other | Admitting: Interventional Cardiology

## 2017-07-22 ENCOUNTER — Ambulatory Visit (INDEPENDENT_AMBULATORY_CARE_PROVIDER_SITE_OTHER): Payer: Medicare Other | Admitting: *Deleted

## 2017-07-22 DIAGNOSIS — Z5181 Encounter for therapeutic drug level monitoring: Secondary | ICD-10-CM

## 2017-07-22 DIAGNOSIS — I480 Paroxysmal atrial fibrillation: Secondary | ICD-10-CM | POA: Diagnosis not present

## 2017-07-22 LAB — POCT INR: INR: 2.3

## 2017-07-22 NOTE — Patient Instructions (Addendum)
  Description   Continue on same dosage 1/2 tablet everyday except 1 tablet on Mondays, Wednesdays, and Fridays. Recheck INR in 10 days.  Call with any new medications, any new procedures, and  with any concerns  [phone]

## 2017-08-01 ENCOUNTER — Ambulatory Visit (INDEPENDENT_AMBULATORY_CARE_PROVIDER_SITE_OTHER): Payer: Medicare Other | Admitting: Pharmacist

## 2017-08-01 DIAGNOSIS — I480 Paroxysmal atrial fibrillation: Secondary | ICD-10-CM

## 2017-08-01 DIAGNOSIS — Z5181 Encounter for therapeutic drug level monitoring: Secondary | ICD-10-CM

## 2017-08-01 LAB — POCT INR: INR: 2.4

## 2017-08-01 NOTE — Patient Instructions (Signed)
Description   Continue on same dosage 1/2 tablet everyday except 1 tablet on Mondays, Wednesdays, and Fridays. Recheck INR in 2 weeks.  Call with any new medications, any new procedures, and  with any concerns  [phone]

## 2017-08-02 DIAGNOSIS — Z85828 Personal history of other malignant neoplasm of skin: Secondary | ICD-10-CM | POA: Diagnosis not present

## 2017-08-02 DIAGNOSIS — L570 Actinic keratosis: Secondary | ICD-10-CM | POA: Diagnosis not present

## 2017-08-02 DIAGNOSIS — S00412A Abrasion of left ear, initial encounter: Secondary | ICD-10-CM | POA: Diagnosis not present

## 2017-08-02 DIAGNOSIS — L905 Scar conditions and fibrosis of skin: Secondary | ICD-10-CM | POA: Diagnosis not present

## 2017-08-08 ENCOUNTER — Other Ambulatory Visit: Payer: Self-pay | Admitting: *Deleted

## 2017-08-08 MED ORDER — WARFARIN SODIUM 5 MG PO TABS: ORAL_TABLET | ORAL | 0 refills | Status: DC

## 2017-08-09 ENCOUNTER — Ambulatory Visit (INDEPENDENT_AMBULATORY_CARE_PROVIDER_SITE_OTHER): Payer: Medicare Other | Admitting: Interventional Cardiology

## 2017-08-09 ENCOUNTER — Encounter: Payer: Self-pay | Admitting: Interventional Cardiology

## 2017-08-09 VITALS — BP 124/68 | HR 78 | Ht 67.0 in | Wt 197.4 lb

## 2017-08-09 DIAGNOSIS — Z7901 Long term (current) use of anticoagulants: Secondary | ICD-10-CM

## 2017-08-09 DIAGNOSIS — I5032 Chronic diastolic (congestive) heart failure: Secondary | ICD-10-CM | POA: Diagnosis not present

## 2017-08-09 DIAGNOSIS — I480 Paroxysmal atrial fibrillation: Secondary | ICD-10-CM

## 2017-08-09 DIAGNOSIS — I10 Essential (primary) hypertension: Secondary | ICD-10-CM | POA: Diagnosis not present

## 2017-08-09 NOTE — Patient Instructions (Signed)
Medication Instructions:  Your physician recommends that you continue on your current medications as directed. Please refer to the Current Medication list given to you today.   Labwork: None ordered  Testing/Procedures: None ordered  Follow-Up: Your physician wants you to follow-up in: 6 months with Dr. Irish Lack. You will receive a reminder letter in the mail two months in advance. If you don't receive a letter, please call our office to schedule the follow-up appointment.   Any Other Special Instructions Will Be Listed Below (If Applicable).  Below is some information on amiodarone. Let us know if you are interested in starting this medicine.  Amiodarone tablets What is this medicine? AMIODARONE (a MEE oh da rone) is an antiarrhythmic drug. It helps make your heart beat regularly. Because of the side effects caused by this medicine, it is only used when other medicines have not worked. It is usually used for heartbeat problems that may be life threatening. This medicine may be used for other purposes; ask your health care provider or pharmacist if you have questions. COMMON BRAND NAME(S): Cordarone, Pacerone What should I tell my health care provider before I take this medicine? They need to know if you have any of these conditions: -liver disease -lung disease -other heart problems -thyroid disease -an unusual or allergic reaction to amiodarone, iodine, other medicines, foods, dyes, or preservatives -pregnant or trying to get pregnant -breast-feeding How should I use this medicine? Take this medicine by mouth with a glass of water. Follow the directions on the prescription label. You can take this medicine with or without food. However, you should always take it the same way each time. Take your doses at regular intervals. Do not take your medicine more often than directed. Do not stop taking except on the advice of your doctor or health care professional. A special MedGuide will  be given to you by the pharmacist with each prescription and refill. Be sure to read this information carefully each time. Talk to your pediatrician regarding the use of this medicine in children. Special care may be needed. Overdosage: If you think you have taken too much of this medicine contact a poison control center or emergency room at once. NOTE: This medicine is only for you. Do not share this medicine with others. What if I miss a dose? If you miss a dose, take it as soon as you can. If it is almost time for your next dose, take only that dose. Do not take double or extra doses. What may interact with this medicine? Do not take this medicine with any of the following medications: -abarelix -apomorphine -arsenic trioxide -certain antibiotics like erythromycin, gemifloxacin, levofloxacin, pentamidine -certain medicines for depression like amoxapine, tricyclic antidepressants -certain medicines for fungal infections like fluconazole, itraconazole, ketoconazole, posaconazole, voriconazole -certain medicines for irregular heart beat like disopyramide, dofetilide, dronedarone, ibutilide, propafenone, sotalol -certain medicines for malaria like chloroquine, halofantrine -cisapride -droperidol -haloperidol -hawthorn -maprotiline -methadone -phenothiazines like chlorpromazine, mesoridazine, thioridazine -pimozide -ranolazine -red yeast rice -vardenafil -ziprasidone This medicine may also interact with the following medications: -antiviral medicines for HIV or AIDS -certain medicines for blood pressure, heart disease, irregular heart beat -certain medicines for cholesterol like atorvastatin, cerivastatin, lovastatin, simvastatin -certain medicines for hepatitis C like sofosbuvir and ledipasvir; sofosbuvir -certain medicines for seizures like phenytoin -certain medicines for thyroid problems -certain medicines that treat or prevent blood clots like  warfarin -cholestyramine -cimetidine -clopidogrel -cyclosporine -dextromethorphan -diuretics -fentanyl -general anesthetics -grapefruit juice -lidocaine -loratadine -methotrexate -other medicines that prolong the  QT interval (cause an abnormal heart rhythm) -procainamide -quinidine -rifabutin, rifampin, or rifapentine -St. John's Wort -trazodone This list may not describe all possible interactions. Give your health care provider a list of all the medicines, herbs, non-prescription drugs, or dietary supplements you use. Also tell them if you smoke, drink alcohol, or use illegal drugs. Some items may interact with your medicine. What should I watch for while using this medicine? Your condition will be monitored closely when you first begin therapy. Often, this drug is first started in a hospital or other monitored health care setting. Once you are on maintenance therapy, visit your doctor or health care professional for regular checks on your progress. Because your condition and use of this medicine carry some risk, it is a good idea to carry an identification card, necklace or bracelet with details of your condition, medications, and doctor or health care professional. Dennis Bast may get drowsy or dizzy. Do not drive, use machinery, or do anything that needs mental alertness until you know how this medicine affects you. Do not stand or sit up quickly, especially if you are an older patient. This reduces the risk of dizzy or fainting spells. This medicine can make you more sensitive to the sun. Keep out of the sun. If you cannot avoid being in the sun, wear protective clothing and use sunscreen. Do not use sun lamps or tanning beds/booths. You should have regular eye exams before and during treatment. Call your doctor if you have blurred vision, see halos, or your eyes become sensitive to light. Your eyes may get dry. It may be helpful to use a lubricating eye solution or artificial tears  solution. If you are going to have surgery or a procedure that requires contrast dyes, tell your doctor or health care professional that you are taking this medicine. What side effects may I notice from receiving this medicine? Side effects that you should report to your doctor or health care professional as soon as possible: -allergic reactions like skin rash, itching or hives, swelling of the face, lips, or tongue -blue-gray coloring of the skin -blurred vision, seeing blue green halos, increased sensitivity of the eyes to light -breathing problems -chest pain -dark urine -fast, irregular heartbeat -feeling faint or light-headed -intolerance to heat or cold -nausea or vomiting -pain and swelling of the scrotum -pain, tingling, numbness in feet, hands -redness, blistering, peeling or loosening of the skin, including inside the mouth -spitting up blood -stomach pain -sweating -unusual or uncontrolled movements of body -unusually weak or tired -weight gain or loss -yellowing of the eyes or skin Side effects that usually do not require medical attention (report to your doctor or health care professional if they continue or are bothersome): -change in sex drive or performance -constipation -dizziness -headache -loss of appetite -trouble sleeping This list may not describe all possible side effects. Call your doctor for medical advice about side effects. You may report side effects to FDA at 1-800-FDA-1088. Where should I keep my medicine? Keep out of the reach of children. Store at room temperature between 20 and 25 degrees C (68 and 77 degrees F). Protect from light. Keep container tightly closed. Throw away any unused medicine after the expiration date. NOTE: This sheet is a summary. It may not cover all possible information. If you have questions about this medicine, talk to your doctor, pharmacist, or health care provider.  2018 Elsevier/Gold Standard (2013-09-10  19:48:11)    If you need a refill on your cardiac medications  before your next appointment, please call your pharmacy.

## 2017-08-09 NOTE — Progress Notes (Signed)
Cardiology Office Note   Date:  08/09/2017   ID:  Sierra, [name] [birth year], MRN [id number]  PCP:  Lavone Orn, MD    No chief complaint on file.  AFib  Wt Readings from Last 3 Encounters:  08/09/17 197 lb 6.4 oz (89.5 kg)  07/15/17 198 lb 1.9 oz (89.9 kg)  07/12/17 198 lb 1.9 oz (89.9 kg)       History of Present Illness: Sierra [name] is a 72 y.o. female  Who has HTN, PACs for many years. In September2016, she was feeling palpitations thought to be from PACs. In December 2016, she had a severe GI bug and went to the ER. She had atrial fibrillation diagnosed in the ambulance.  She had a complicated urinary infection in 2018. SHe was hospitalized in evaluated with urology. She had a high fever. She may need an additional urologic procedure to help.   She was started on Coumadin. SHe is not interested in a DOAC.  Recent monitor showed AFib with RVR.  No urology procedures were to be scheduled soon so we decided to pursue DCCV due to recurrent sx.   She had a successful cardioversion on Jul 15, 2017.  She has felt PACs, but they are manageable.    Denies : Chest pain. Dizziness. Leg edema. Nitroglycerin use. Orthopnea. Paroxysmal nocturnal dyspnea. Shortness of breath. Syncope.       Past Medical History:  Diagnosis Date  . Anxiety   . Arthritis    "joints" (09/19/2013)  . Atrial fibrillation ([place])   . Basal cell carcinoma of cheek    left  . Chronic lower back pain   . GERD (gastroesophageal reflux disease)   . H/O hiatal hernia   . Hypertension   . Migraine    "haven't had any in a long time" (09/19/2013)  . Pneumonia ~ 2012    Past Surgical History:  Procedure Laterality Date  . BASAL CELL CARCINOMA EXCISION Left    "cheek"  . CARDIOVERSION N/A 07/15/2017   Procedure: CARDIOVERSION;  Surgeon: Acie Fredrickson Wonda Cheng, MD;  Location: [facility];  Service: Cardiovascular;  Laterality: N/A;  . CHOLECYSTECTOMY  1990's  . COLONOSCOPY WITH  PROPOFOL N/A 07/05/2016   Procedure: COLONOSCOPY WITH PROPOFOL;  Surgeon: Garlan Fair, MD;  Location: [facility];  Service: Endoscopy;  Laterality: N/A;  . DILATION AND CURETTAGE OF UTERUS    . HERNIA REPAIR  9833   umbilical  . JOINT REPLACEMENT    . OOPHORECTOMY Bilateral   . REVISION TOTAL HIP ARTHROPLASTY Left 09/19/2013  . SHOULDER ARTHROSCOPY W/ ROTATOR CUFF REPAIR Right   . TMJ ARTHROPLASTY    . TOTAL HIP ARTHROPLASTY Left 2009  . TOTAL HIP ARTHROPLASTY Right 2011  . TOTAL HIP REVISION Left 09/19/2013   Procedure: LEFT TOTAL HIP REVISION;  Surgeon: Kerin Salen, MD;  Location: [facility];  Service: Orthopedics;  Laterality: Left;  . TUBAL LIGATION    . VAGINAL HYSTERECTOMY  1990's     Current Outpatient Medications  Medication Sig Dispense Refill  . amLODipine (NORVASC) 10 MG tablet Take 10 mg by mouth daily.    . Cholecalciferol (VITAMIN D-3) 5000 UNITS TABS Take 5,000 Units by mouth every Monday, Wednesday, and Friday.     . citalopram (CELEXA) 40 MG tablet Take 0.5 tablets (20 mg total) by mouth daily.    . COLESTID 5 g packet Take 5 g by mouth 2 (two) times daily.   3  . fexofenadine (ALLEGRA) 180  MG tablet Take 180 mg by mouth daily.    . Glucomannan 1000 MG CAPS Take 1,000 mg by mouth daily.    . Glucosamine HCl 1000 MG TABS Take 1 tablet by mouth daily.    Marland Kitchen MAGNESIUM CITRATE PO Take 250 mg daily by mouth.    . metolazone (ZAROXOLYN) 5 MG tablet Take 5 mg by mouth daily as needed (Takes is weight gets above 195).   0  . metoprolol succinate (TOPROL-XL) 100 MG 24 hr tablet Take 1 tablet (100 mg total) by mouth daily. Take with or immediately following a meal. 90 tablet 3  . Multiple Vitamins-Minerals (MULTIVITAMIN WITH MINERALS) tablet Take 1 tablet by mouth daily. 50+ for her    . mupirocin ointment (BACTROBAN) 2 % APPLY TO AFFECTED AREA IN LEFT EAR TWICE A DAY AS NEEDED  1  . nabumetone (RELAFEN) 500 MG tablet Take 500 mg by mouth 2 (two) times daily. Will stop prior to  procedure    . potassium chloride (K-DUR,KLOR-CON) 10 MEQ tablet Take 10 mEq by mouth daily.    . Probiotic Product (ALIGN) 4 MG CAPS Take 1 capsule by mouth daily.    . RABEprazole (ACIPHEX) 20 MG tablet Take 20 mg by mouth daily.     Marland Kitchen terazosin (HYTRIN) 10 MG capsule Take 10 mg by mouth at bedtime.    . torsemide (DEMADEX) 20 MG tablet Take 20 mg by mouth daily.    . valsartan (DIOVAN) 160 MG tablet Take 1 tablet (160 mg total) by mouth daily. 90 tablet 3  . vitamin B-12 (CYANOCOBALAMIN) 500 MCG tablet Take 500 mcg by mouth every Monday, Wednesday, and Friday.     . vitamin C (ASCORBIC ACID) 250 MG tablet Take 250 mg by mouth 3 (three) times a week.     . warfarin (COUMADIN) 5 MG tablet Take as directed by coumadin clinic 90 tablet 0   No current facility-administered medications for this visit.     Allergies:   Ace inhibitors; Codeine; Erythromycin; and Iohexol    Social History:  The patient  reports that  has never smoked. she has never used smokeless tobacco. She reports that she drinks alcohol. She reports that she does not use drugs.   Family History:  The patient's family history includes Heart attack in her maternal aunt, mother, and paternal uncle.    ROS:  Please see the history of present illness.   Otherwise, review of systems are positive for palpitations.   All other systems are reviewed and negative.    PHYSICAL EXAM: VS:  BP 124/68   Pulse 78   Ht 5\' 7"  (1.702 m)   Wt 197 lb 6.4 oz (89.5 kg)   SpO2 97%   BMI 30.92 kg/m  , BMI Body mass index is 30.92 kg/m. GEN: Well nourished, well developed, in no acute distress  HEENT: normal  Neck: no JVD, carotid bruits, or masses Cardiac: irregularly irregular; no murmurs, rubs, or gallops,; tr ankle, pretibial edema  Respiratory:  clear to auscultation bilaterally, normal work of breathing GI: soft, nontender, nondistended, + BS MS: no deformity or atrophy  Skin: warm and dry, no rash Neuro:  Strength and sensation  are intact Psych: euthymic mood, full affect   EKG:   The ekg ordered today demonstrates AFib, rate controlled   Recent Labs: 04/07/2017: B Natriuretic Peptide 472.8 04/08/2017: Magnesium 1.8 07/12/2017: BUN 47; Creatinine, Ser 1.09; Hemoglobin 10.3; Platelets 154; Potassium 3.9; Sodium 142   Lipid Panel No results  found for: CHOL, TRIG, HDL, CHOLHDL, VLDL, LDLCALC, LDLDIRECT   Other studies Reviewed: Additional studies/ records that were reviewed today with results demonstrating: [facility] records reviewed.   ASSESSMENT AND PLAN:  1. AFib : rate controlled.  Coumadin for stroke prevention.  Feels fatigued but very little distinct palpitations.  We discussed Amiodarone to get her back in NSR.  THey will think about it.  I think rate control and anticoagulation is a reasonable strategy.  If they want to pursue Amio, she will call back.   2. Anticoagulated: No bleeding problems.  3. Leg edema/Chronic diastolic heart failure: Appears euvolemic. Continue daily weight and diuretics, slat restriction.   4. HTN: Well controlled today.  Continue current meds.     Current medicines are reviewed at length with the patient today.  The patient concerns regarding her medicines were addressed.  The following changes have been made:  No change  Labs/ tests ordered today include:  No orders of the defined types were placed in this encounter.   Recommend 150 minutes/week of aerobic exercise Low fat, low carb, high fiber diet recommended  Disposition:   FU in  55months   Signed, Larae Grooms, MD  08/09/2017 3:00 PM    [facility] [address], [place], [place]  [address] Phone: [phone]; Fax: [phone]

## 2017-08-17 ENCOUNTER — Ambulatory Visit (INDEPENDENT_AMBULATORY_CARE_PROVIDER_SITE_OTHER): Payer: Medicare Other | Admitting: Pharmacist

## 2017-08-17 DIAGNOSIS — Z5181 Encounter for therapeutic drug level monitoring: Secondary | ICD-10-CM | POA: Diagnosis not present

## 2017-08-17 DIAGNOSIS — I480 Paroxysmal atrial fibrillation: Secondary | ICD-10-CM

## 2017-08-17 LAB — POCT INR: INR: 2.4

## 2017-08-17 NOTE — Patient Instructions (Signed)
Description   Continue on same dosage 1/2 tablet everyday except 1 tablet on Mondays, Wednesdays, and Fridays. Recheck INR in 3 weeks.  Call with any new medications, any new procedures, and  with any concerns  [phone]

## 2017-08-24 DIAGNOSIS — N201 Calculus of ureter: Secondary | ICD-10-CM | POA: Diagnosis not present

## 2017-09-07 ENCOUNTER — Ambulatory Visit (INDEPENDENT_AMBULATORY_CARE_PROVIDER_SITE_OTHER): Payer: Medicare Other | Admitting: *Deleted

## 2017-09-07 DIAGNOSIS — Z5181 Encounter for therapeutic drug level monitoring: Secondary | ICD-10-CM

## 2017-09-07 DIAGNOSIS — I480 Paroxysmal atrial fibrillation: Secondary | ICD-10-CM | POA: Diagnosis not present

## 2017-09-07 LAB — POCT INR: INR: 2.3

## 2017-09-07 NOTE — Patient Instructions (Signed)
Description   Continue on same dosage 1/2 tablet everyday except 1 tablet on Mondays, Wednesdays, and Fridays. Recheck INR in 4 weeks.  Call with any new medications, any new procedures, and  with any concerns  [phone]

## 2017-09-14 DIAGNOSIS — M545 Low back pain: Secondary | ICD-10-CM | POA: Diagnosis not present

## 2017-09-20 ENCOUNTER — Other Ambulatory Visit: Payer: Self-pay | Admitting: Interventional Cardiology

## 2017-09-20 MED ORDER — METOPROLOL SUCCINATE ER 100 MG PO TB24: 100.0000 mg | ORAL_TABLET | Freq: Every day | ORAL | 3 refills | Status: DC

## 2017-09-26 DIAGNOSIS — M179 Osteoarthritis of knee, unspecified: Secondary | ICD-10-CM | POA: Diagnosis not present

## 2017-09-26 DIAGNOSIS — I10 Essential (primary) hypertension: Secondary | ICD-10-CM | POA: Diagnosis not present

## 2017-09-26 DIAGNOSIS — I509 Heart failure, unspecified: Secondary | ICD-10-CM | POA: Diagnosis not present

## 2017-09-26 DIAGNOSIS — M169 Osteoarthritis of hip, unspecified: Secondary | ICD-10-CM | POA: Diagnosis not present

## 2017-09-26 DIAGNOSIS — I481 Persistent atrial fibrillation: Secondary | ICD-10-CM | POA: Diagnosis not present

## 2017-10-05 ENCOUNTER — Ambulatory Visit (INDEPENDENT_AMBULATORY_CARE_PROVIDER_SITE_OTHER): Payer: Medicare Other | Admitting: *Deleted

## 2017-10-05 DIAGNOSIS — I480 Paroxysmal atrial fibrillation: Secondary | ICD-10-CM | POA: Diagnosis not present

## 2017-10-05 DIAGNOSIS — Z5181 Encounter for therapeutic drug level monitoring: Secondary | ICD-10-CM | POA: Diagnosis not present

## 2017-10-05 DIAGNOSIS — I481 Persistent atrial fibrillation: Secondary | ICD-10-CM | POA: Diagnosis not present

## 2017-10-05 DIAGNOSIS — I4819 Other persistent atrial fibrillation: Secondary | ICD-10-CM

## 2017-10-05 LAB — POCT INR: INR: 2.2

## 2017-10-05 NOTE — Patient Instructions (Signed)
Description   Continue on same dosage 1/2 tablet everyday except 1 tablet on Mondays, Wednesdays, and Fridays. Recheck INR in 5 weeks.  Call with any new medications, any new procedures, and  with any concerns  [phone]

## 2017-10-25 DIAGNOSIS — Z79899 Other long term (current) drug therapy: Secondary | ICD-10-CM | POA: Diagnosis not present

## 2017-10-25 DIAGNOSIS — I481 Persistent atrial fibrillation: Secondary | ICD-10-CM | POA: Diagnosis not present

## 2017-10-25 DIAGNOSIS — K219 Gastro-esophageal reflux disease without esophagitis: Secondary | ICD-10-CM | POA: Diagnosis not present

## 2017-10-25 DIAGNOSIS — I10 Essential (primary) hypertension: Secondary | ICD-10-CM | POA: Diagnosis not present

## 2017-11-01 ENCOUNTER — Other Ambulatory Visit: Payer: Self-pay | Admitting: Interventional Cardiology

## 2017-11-08 DIAGNOSIS — Z85828 Personal history of other malignant neoplasm of skin: Secondary | ICD-10-CM | POA: Diagnosis not present

## 2017-11-08 DIAGNOSIS — L905 Scar conditions and fibrosis of skin: Secondary | ICD-10-CM | POA: Diagnosis not present

## 2017-11-08 DIAGNOSIS — C44712 Basal cell carcinoma of skin of right lower limb, including hip: Secondary | ICD-10-CM | POA: Diagnosis not present

## 2017-11-08 DIAGNOSIS — D485 Neoplasm of uncertain behavior of skin: Secondary | ICD-10-CM | POA: Diagnosis not present

## 2017-11-08 DIAGNOSIS — S20319A Abrasion of unspecified front wall of thorax, initial encounter: Secondary | ICD-10-CM | POA: Diagnosis not present

## 2017-11-09 ENCOUNTER — Ambulatory Visit (INDEPENDENT_AMBULATORY_CARE_PROVIDER_SITE_OTHER): Payer: Medicare Other | Admitting: *Deleted

## 2017-11-09 DIAGNOSIS — I480 Paroxysmal atrial fibrillation: Secondary | ICD-10-CM | POA: Diagnosis not present

## 2017-11-09 DIAGNOSIS — Z5181 Encounter for therapeutic drug level monitoring: Secondary | ICD-10-CM | POA: Diagnosis not present

## 2017-11-09 LAB — POCT INR: INR: 2 (ref 2.0–3.0)

## 2017-11-09 NOTE — Patient Instructions (Signed)
Description   Continue on same dosage 1/2 tablet everyday except 1 tablet on Mondays, Wednesdays, and Fridays. Recheck INR in 6 weeks.  Call with any new medications, any new procedures, and  with any concerns  [phone]      

## 2017-11-13 DIAGNOSIS — I481 Persistent atrial fibrillation: Secondary | ICD-10-CM | POA: Diagnosis not present

## 2017-11-13 DIAGNOSIS — I10 Essential (primary) hypertension: Secondary | ICD-10-CM | POA: Diagnosis not present

## 2017-11-13 DIAGNOSIS — I509 Heart failure, unspecified: Secondary | ICD-10-CM | POA: Diagnosis not present

## 2017-11-13 DIAGNOSIS — M179 Osteoarthritis of knee, unspecified: Secondary | ICD-10-CM | POA: Diagnosis not present

## 2017-11-13 DIAGNOSIS — M169 Osteoarthritis of hip, unspecified: Secondary | ICD-10-CM | POA: Diagnosis not present

## 2017-12-21 ENCOUNTER — Ambulatory Visit (INDEPENDENT_AMBULATORY_CARE_PROVIDER_SITE_OTHER): Payer: Medicare Other | Admitting: *Deleted

## 2017-12-21 DIAGNOSIS — I4819 Other persistent atrial fibrillation: Secondary | ICD-10-CM

## 2017-12-21 DIAGNOSIS — I481 Persistent atrial fibrillation: Secondary | ICD-10-CM

## 2017-12-21 DIAGNOSIS — Z5181 Encounter for therapeutic drug level monitoring: Secondary | ICD-10-CM | POA: Diagnosis not present

## 2017-12-21 DIAGNOSIS — I480 Paroxysmal atrial fibrillation: Secondary | ICD-10-CM

## 2017-12-21 LAB — POCT INR: INR: 2.2 (ref 2.0–3.0)

## 2017-12-21 NOTE — Patient Instructions (Signed)
Description   Continue on same dosage 1/2 tablet everyday except 1 tablet on Mondays, Wednesdays, and Fridays. Recheck INR in 6 weeks.  Call with any new medications, any new procedures, and  with any concerns  [phone]

## 2018-01-11 DIAGNOSIS — C44712 Basal cell carcinoma of skin of right lower limb, including hip: Secondary | ICD-10-CM | POA: Diagnosis not present

## 2018-01-23 IMAGING — US US RENAL
1 series · 14 of 25 positions shown · non-contrast
Comparison: CT abdomen and pelvis 03/21/2017

CLINICAL DATA: Evaluation for possible hydronephrosis.

EXAM:
RENAL / URINARY TRACT ULTRASOUND COMPLETE

[Series 1: us renal · 0.28mm/px · 14 of 29 slices shown]
[im 1/29]
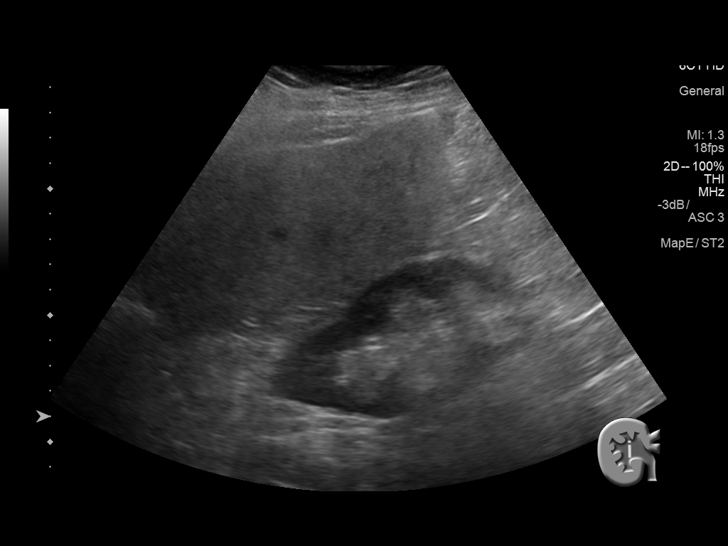
[im 3/29]
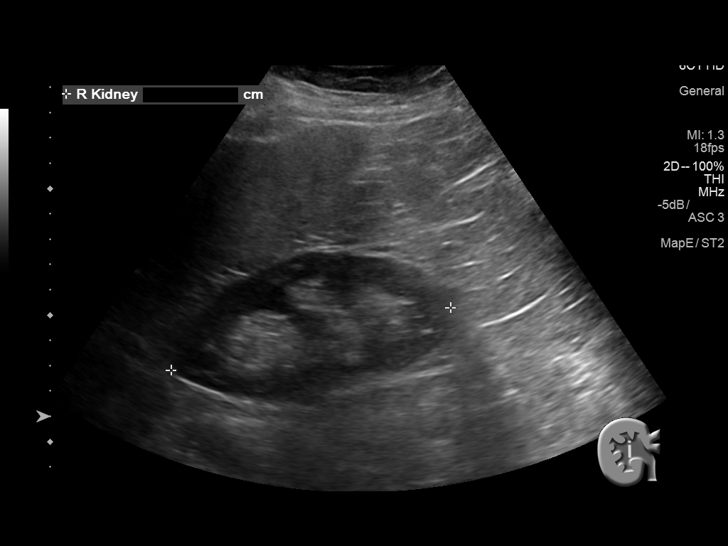
[im 5/29]
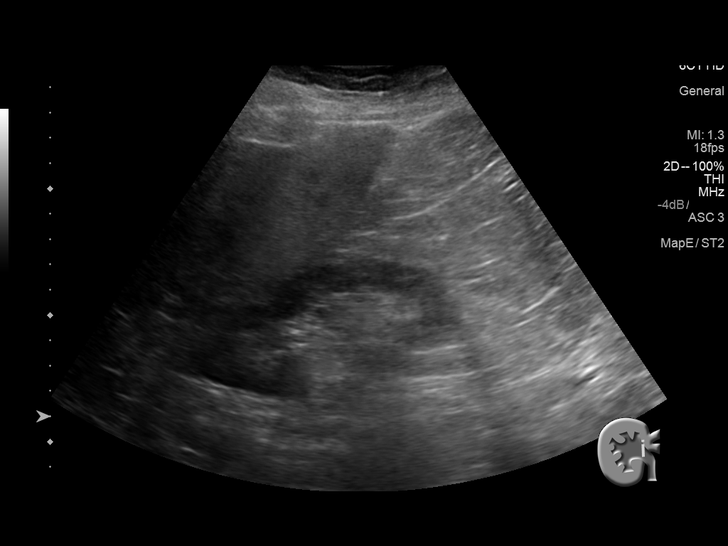
[im 8/29]
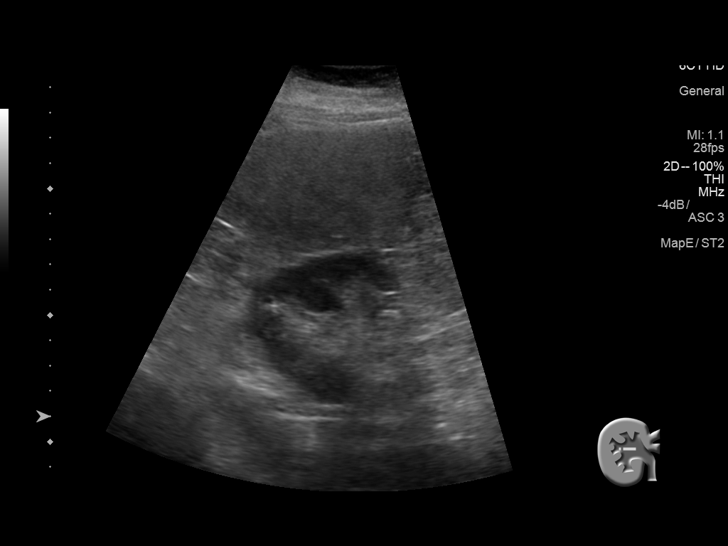
[im 10/29]
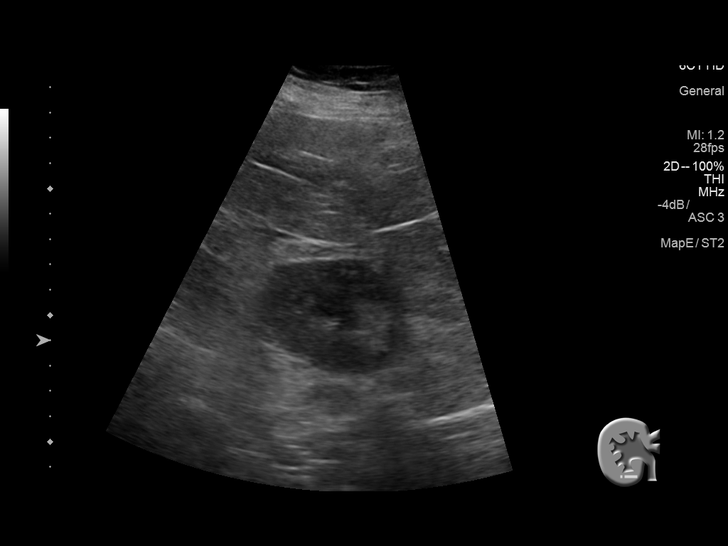
[im 11/29]
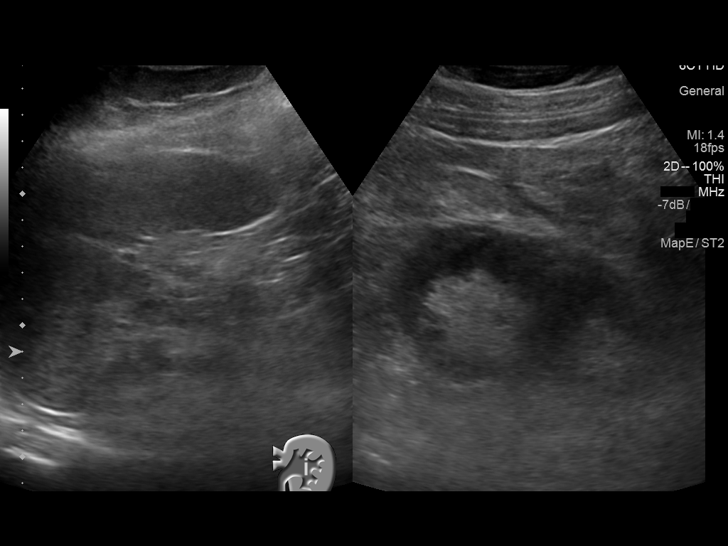
[im 13/29]
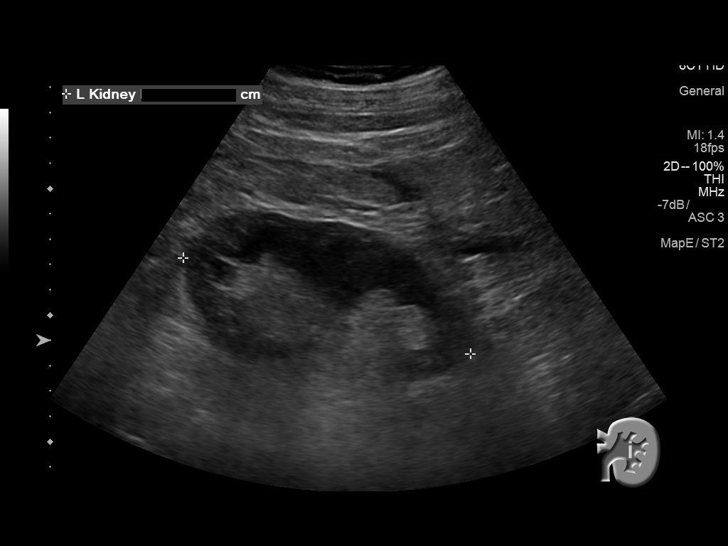
[im 16/29]
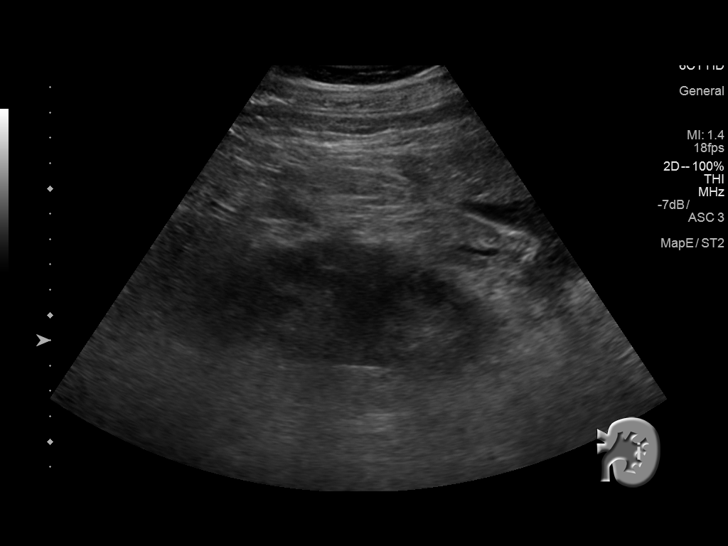
[im 18/29]
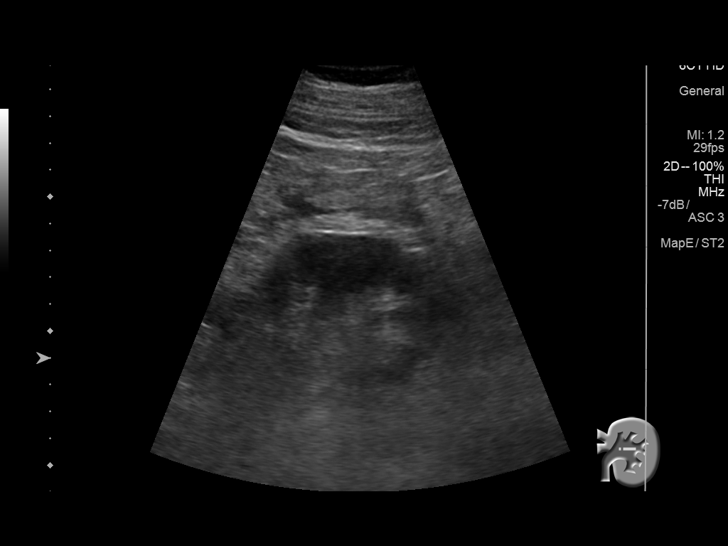
[im 19/29]
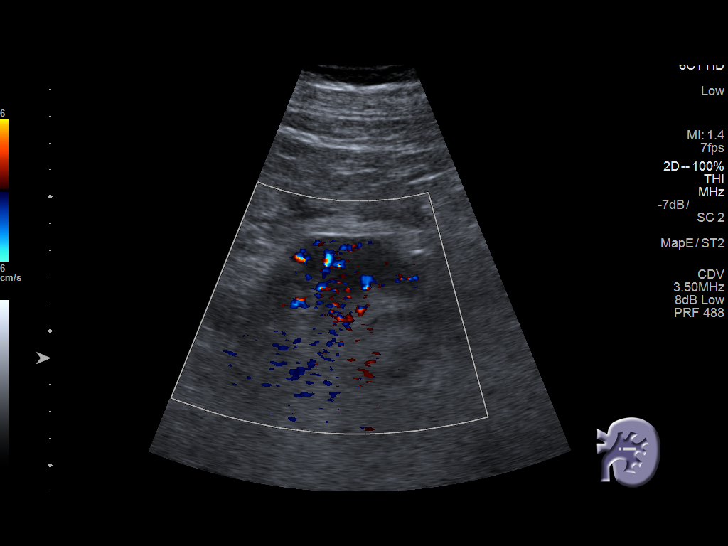
[im 22/29]
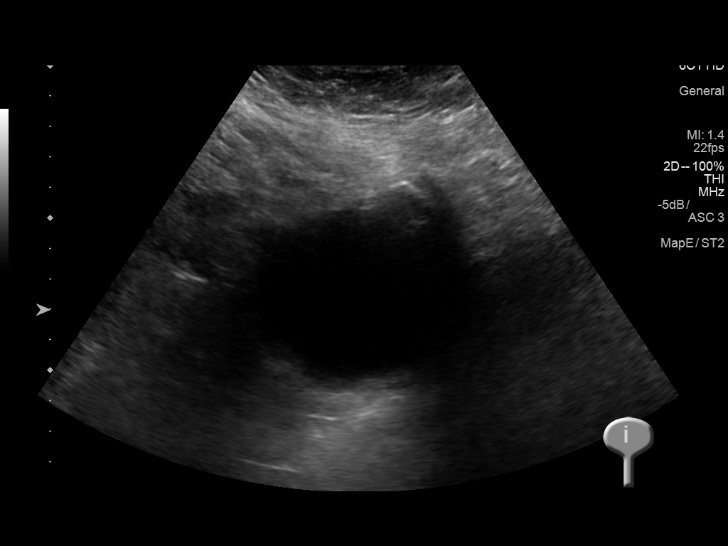
[im 24/29]
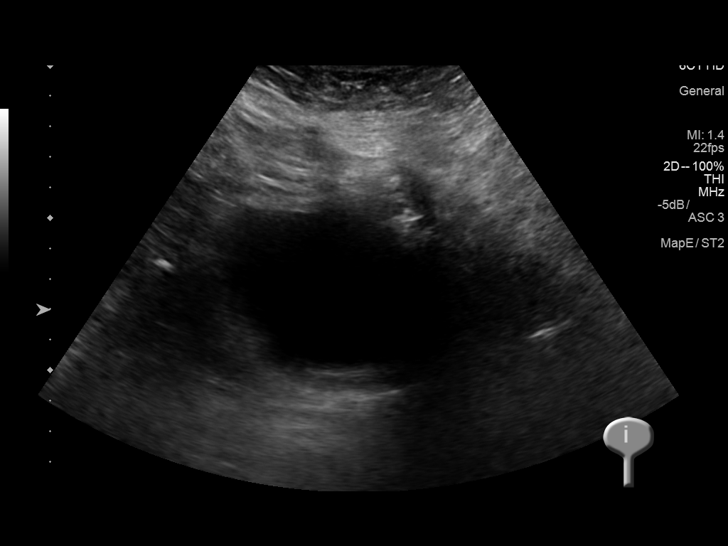
[im 26/29]
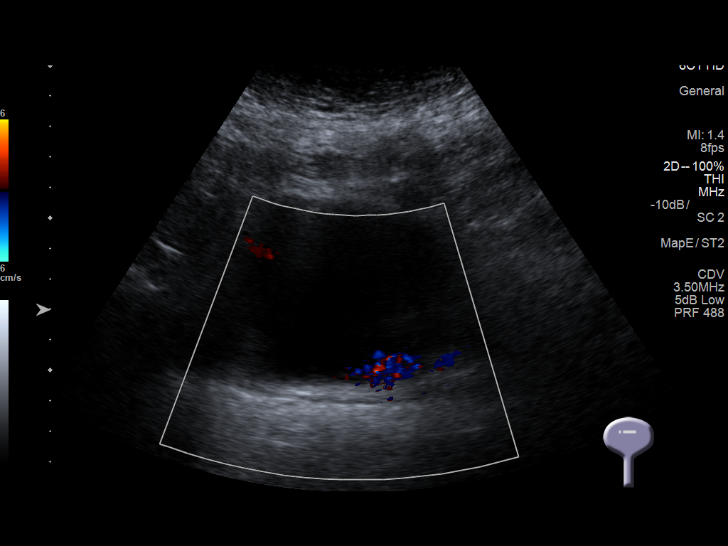
[im 29/29]
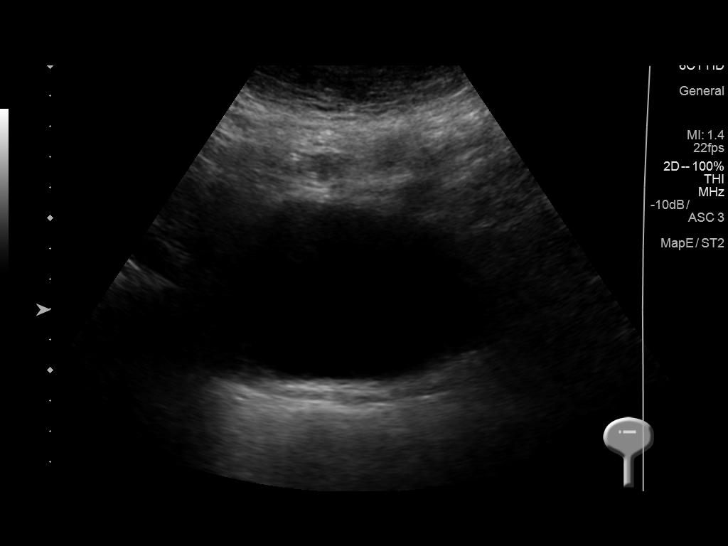

[14 of 25 positions shown; findings below may reference images not displayed]

FINDINGS: Right Kidney:

Length: 11.3 cm. Echogenicity within normal limits. No mass or
hydronephrosis visualized.

Left Kidney:

Length: 12 cm. Echogenicity within normal limits. No mass or
hydronephrosis visualized.

Bladder:

No bladder wall thickening or filling defect. Urine flow jets are
demonstrated bilaterally on color flow Doppler imaging.
IMPRESSION: No evidence of hydronephrosis in either kidney.

## 2018-02-01 ENCOUNTER — Ambulatory Visit (INDEPENDENT_AMBULATORY_CARE_PROVIDER_SITE_OTHER): Payer: Medicare Other | Admitting: *Deleted

## 2018-02-01 DIAGNOSIS — I480 Paroxysmal atrial fibrillation: Secondary | ICD-10-CM | POA: Diagnosis not present

## 2018-02-01 DIAGNOSIS — Z5181 Encounter for therapeutic drug level monitoring: Secondary | ICD-10-CM

## 2018-02-01 LAB — POCT INR: INR: 2.4 (ref 2.0–3.0)

## 2018-02-01 NOTE — Patient Instructions (Signed)
Description   Continue on same dosage 1/2 tablet everyday except 1 tablet on Mondays, Wednesdays, and Fridays. Recheck INR in 6 weeks.  Call with any new medications, any new procedures, and  with any concerns  [phone]

## 2018-02-13 DIAGNOSIS — Z7901 Long term (current) use of anticoagulants: Secondary | ICD-10-CM | POA: Diagnosis not present

## 2018-02-13 DIAGNOSIS — S81819A Laceration without foreign body, unspecified lower leg, initial encounter: Secondary | ICD-10-CM | POA: Diagnosis not present

## 2018-03-06 DIAGNOSIS — R252 Cramp and spasm: Secondary | ICD-10-CM | POA: Diagnosis not present

## 2018-03-08 DIAGNOSIS — Z85828 Personal history of other malignant neoplasm of skin: Secondary | ICD-10-CM | POA: Diagnosis not present

## 2018-03-08 DIAGNOSIS — S80812A Abrasion, left lower leg, initial encounter: Secondary | ICD-10-CM | POA: Diagnosis not present

## 2018-03-08 DIAGNOSIS — D485 Neoplasm of uncertain behavior of skin: Secondary | ICD-10-CM | POA: Diagnosis not present

## 2018-03-08 DIAGNOSIS — C44622 Squamous cell carcinoma of skin of right upper limb, including shoulder: Secondary | ICD-10-CM | POA: Diagnosis not present

## 2018-03-15 ENCOUNTER — Ambulatory Visit (INDEPENDENT_AMBULATORY_CARE_PROVIDER_SITE_OTHER): Payer: Medicare Other | Admitting: *Deleted

## 2018-03-15 DIAGNOSIS — I480 Paroxysmal atrial fibrillation: Secondary | ICD-10-CM | POA: Diagnosis not present

## 2018-03-15 DIAGNOSIS — Z5181 Encounter for therapeutic drug level monitoring: Secondary | ICD-10-CM | POA: Diagnosis not present

## 2018-03-15 LAB — POCT INR: INR: 2 (ref 2.0–3.0)

## 2018-03-15 NOTE — Patient Instructions (Signed)
Description   Continue on same dosage 1/2 tablet everyday except 1 tablet on Mondays, Wednesdays, and Fridays. Recheck INR in 6 weeks.  Call with any new medications, any new procedures, and  with any concerns  [phone]

## 2018-03-16 DIAGNOSIS — C44622 Squamous cell carcinoma of skin of right upper limb, including shoulder: Secondary | ICD-10-CM | POA: Diagnosis not present

## 2018-03-17 DIAGNOSIS — Z23 Encounter for immunization: Secondary | ICD-10-CM | POA: Diagnosis not present

## 2018-03-23 DIAGNOSIS — Z1231 Encounter for screening mammogram for malignant neoplasm of breast: Secondary | ICD-10-CM | POA: Diagnosis not present

## 2018-03-24 DIAGNOSIS — I509 Heart failure, unspecified: Secondary | ICD-10-CM | POA: Diagnosis not present

## 2018-03-24 DIAGNOSIS — M169 Osteoarthritis of hip, unspecified: Secondary | ICD-10-CM | POA: Diagnosis not present

## 2018-03-24 DIAGNOSIS — M179 Osteoarthritis of knee, unspecified: Secondary | ICD-10-CM | POA: Diagnosis not present

## 2018-03-24 DIAGNOSIS — I4819 Other persistent atrial fibrillation: Secondary | ICD-10-CM | POA: Diagnosis not present

## 2018-03-24 DIAGNOSIS — I10 Essential (primary) hypertension: Secondary | ICD-10-CM | POA: Diagnosis not present

## 2018-03-26 NOTE — Progress Notes (Signed)
Cardiology Office Note   Date:  03/27/2018   ID:  Devon, Kingdon [birth year], MRN [id number]  PCP:  Lavone Orn, MD    No chief complaint on file.  AFib  Wt Readings from Last 3 Encounters:  03/27/18 191 lb (86.6 kg)  08/09/17 197 lb 6.4 oz (89.5 kg)  07/15/17 198 lb 1.9 oz (89.9 kg)       History of Present Illness: Sierra [name] is a 72 y.o. female  Who has HTN, PACs for many years. In September2016, she was feeling palpitations thought to be from PACs. In December 2016, she had a severe GI bug and went to the ER. She had atrial fibrillation diagnosed in the ambulance.  She had a complicated urinary infectionin 2018. SHe was hospitalized in evaluated with urology. She had a high fever. She may need an additional urologic procedure to help.   She was started on Coumadin. SHe is not interested in a DOAC.  Recent monitor showed AFib with RVR. No urology procedures were to be scheduled soon so we decided to pursue DCCV due to recurrent sx.   She had a successful cardioversion on Jul 15, 2017.  She has felt PACs, but they are manageable.    Denies : Chest pain. Dizziness. Leg edema. Nitroglycerin use. Orthopnea. Paroxysmal nocturnal dyspnea. Shortness of breath. Syncope.   Rare palpitations.  Occasional bruising.  Past Medical History:  Diagnosis Date  . Anxiety   . Arthritis    "joints" (09/19/2013)  . Atrial fibrillation ([place])   . Basal cell carcinoma of cheek    left  . Chronic lower back pain   . GERD (gastroesophageal reflux disease)   . H/O hiatal hernia   . Hypertension   . Migraine    "haven't had any in a long time" (09/19/2013)  . Pneumonia ~ 2012    Past Surgical History:  Procedure Laterality Date  . BASAL CELL CARCINOMA EXCISION Left    "cheek"  . CARDIOVERSION N/A 07/15/2017   Procedure: CARDIOVERSION;  Surgeon: Acie Fredrickson Wonda Cheng, MD;  Location: [facility];  Service: Cardiovascular;  Laterality: N/A;  . CHOLECYSTECTOMY   1990's  . COLONOSCOPY WITH PROPOFOL N/A 07/05/2016   Procedure: COLONOSCOPY WITH PROPOFOL;  Surgeon: Garlan Fair, MD;  Location: [facility];  Service: Endoscopy;  Laterality: N/A;  . DILATION AND CURETTAGE OF UTERUS    . HERNIA REPAIR  8144   umbilical  . JOINT REPLACEMENT    . OOPHORECTOMY Bilateral   . REVISION TOTAL HIP ARTHROPLASTY Left 09/19/2013  . SHOULDER ARTHROSCOPY W/ ROTATOR CUFF REPAIR Right   . TMJ ARTHROPLASTY    . TOTAL HIP ARTHROPLASTY Left 2009  . TOTAL HIP ARTHROPLASTY Right 2011  . TOTAL HIP REVISION Left 09/19/2013   Procedure: LEFT TOTAL HIP REVISION;  Surgeon: Kerin Salen, MD;  Location: [facility];  Service: Orthopedics;  Laterality: Left;  . TUBAL LIGATION    . VAGINAL HYSTERECTOMY  1990's     Current Outpatient Medications  Medication Sig Dispense Refill  . amLODipine (NORVASC) 10 MG tablet Take 10 mg by mouth daily.    . Cholecalciferol (VITAMIN D-3) 5000 UNITS TABS Take 5,000 Units by mouth every Monday, Wednesday, and Friday.     . citalopram (CELEXA) 40 MG tablet Take 0.5 tablets (20 mg total) by mouth daily.    . Coenzyme Q10 (CO Q-10) 100 MG CAPS Take 1 capsule by mouth daily.    . COLESTID 5 g packet Take 5  g by mouth 2 (two) times daily.   3  . fexofenadine (ALLEGRA) 180 MG tablet Take 180 mg by mouth daily.    . Glucomannan 1000 MG CAPS Take 1,000 mg by mouth daily.    . Glucosamine HCl 1000 MG TABS Take 1 tablet by mouth daily.    Marland Kitchen MAGNESIUM CITRATE PO Take 250 mg daily by mouth.    . metolazone (ZAROXOLYN) 5 MG tablet Take 5 mg by mouth daily as needed (Takes is weight gets above 195).   0  . metoprolol succinate (TOPROL-XL) 100 MG 24 hr tablet Take 1 tablet (100 mg total) by mouth daily. Take with or immediately following a meal. 90 tablet 3  . Multiple Vitamins-Minerals (MULTIVITAMIN WITH MINERALS) tablet Take 1 tablet by mouth daily. 50+ for her    . Multiple Vitamins-Minerals (PRESERVISION AREDS 2) CAPS Take 1 capsule by mouth daily.    .  nabumetone (RELAFEN) 500 MG tablet Take 500 mg by mouth 2 (two) times daily. Will stop prior to procedure    . potassium chloride (K-DUR,KLOR-CON) 10 MEQ tablet Take 10 mEq by mouth daily.    . Probiotic Product (ALIGN) 4 MG CAPS Take 1 capsule by mouth daily.    . RABEprazole (ACIPHEX) 20 MG tablet Take 20 mg by mouth daily.     Marland Kitchen terazosin (HYTRIN) 10 MG capsule Take 10 mg by mouth at bedtime.    . torsemide (DEMADEX) 20 MG tablet Take 20 mg by mouth daily.    . valsartan (DIOVAN) 160 MG tablet Take 1 tablet (160 mg total) by mouth daily. 90 tablet 3  . vitamin B-12 (CYANOCOBALAMIN) 500 MCG tablet Take 500 mcg by mouth every Monday, Wednesday, and Friday.     . vitamin C (ASCORBIC ACID) 250 MG tablet Take 250 mg by mouth 3 (three) times a week.     . warfarin (COUMADIN) 5 MG tablet TAKE AS DIRECTED BY COUMADIN CLINIC 90 tablet 1   No current facility-administered medications for this visit.     Allergies:   Ace inhibitors; Codeine; Erythromycin; Iohexol; and Iodinated diagnostic agents    Social History:  The patient  reports that she has never smoked. She has never used smokeless tobacco. She reports that she drinks alcohol. She reports that she does not use drugs.   Family History:  The patient's family history includes Heart attack in her maternal aunt, mother, and paternal uncle.    ROS:  Please see the history of present illness.   Otherwise, review of systems are positive for weight fluctuations.   All other systems are reviewed and negative.    PHYSICAL EXAM: VS:  BP 100/70   Pulse 76   Ht 5\' 7"  (1.702 m)   Wt 191 lb (86.6 kg)   SpO2 96%   BMI 29.91 kg/m  , BMI Body mass index is 29.91 kg/m. GEN: Well nourished, well developed, in no acute distress  HEENT: normal  Neck: no JVD, carotid bruits, or masses Cardiac: RRR; no murmurs, rubs, or gallops,no edema  Respiratory:  clear to auscultation bilaterally, normal work of breathing GI: soft, nontender, nondistended, +  BS MS: no deformity or atrophy  Skin: warm and dry, no rash Neuro:  Strength and sensation are intact Psych: euthymic mood, full affect    Recent Labs: 04/07/2017: B Natriuretic Peptide 472.8 04/08/2017: Magnesium 1.8 07/12/2017: BUN 47; Creatinine, Ser 1.09; Hemoglobin 10.3; Platelets 154; Potassium 3.9; Sodium 142   Lipid Panel No results found for: CHOL, TRIG,  HDL, CHOLHDL, VLDL, LDLCALC, LDLDIRECT   Other studies Reviewed: Additional studies/ records that were reviewed today with results demonstrating: LVEF normal in 10/18.   ASSESSMENT AND PLAN:  1. AFib: Appears to be in NSR.  Still feels PACs at times.  Stroke prevention with warfarin. 2. Anticoagulated: No bleeding problems.  3. Chronic diastolic heart failure: Appears euvolemic.  Using diuretics.  Weighing daily.  Minimize salt intake.  4. Leg edema: Controlled.  5. HTN: The current medical regimen is effective;  continue present plan and medications.   Current medicines are reviewed at length with the patient today.  The patient concerns regarding her medicines were addressed.  The following changes have been made:  No change  Labs/ tests ordered today include:  No orders of the defined types were placed in this encounter.   Recommend 150 minutes/week of aerobic exercise Low fat, low carb, high fiber diet recommended  Disposition:   FU in 9 months   Signed, Larae Grooms, MD  03/27/2018 11:45 AM    [address], [place], [place]  [address] Phone: [phone]; Fax: [phone]

## 2018-03-27 ENCOUNTER — Encounter: Payer: Self-pay | Admitting: Interventional Cardiology

## 2018-03-27 ENCOUNTER — Ambulatory Visit (INDEPENDENT_AMBULATORY_CARE_PROVIDER_SITE_OTHER): Payer: Medicare Other | Admitting: Interventional Cardiology

## 2018-03-27 VITALS — BP 100/70 | HR 76 | Ht 67.0 in | Wt 191.0 lb

## 2018-03-27 DIAGNOSIS — I5032 Chronic diastolic (congestive) heart failure: Secondary | ICD-10-CM | POA: Diagnosis not present

## 2018-03-27 DIAGNOSIS — I4819 Other persistent atrial fibrillation: Secondary | ICD-10-CM

## 2018-03-27 DIAGNOSIS — I10 Essential (primary) hypertension: Secondary | ICD-10-CM

## 2018-03-27 DIAGNOSIS — Z7901 Long term (current) use of anticoagulants: Secondary | ICD-10-CM

## 2018-03-27 DIAGNOSIS — R600 Localized edema: Secondary | ICD-10-CM

## 2018-03-27 NOTE — Patient Instructions (Signed)
Medication Instructions:  Your physician recommends that you continue on your current medications as directed. Please refer to the Current Medication list given to you today.  If you need a refill on your cardiac medications before your next appointment, please call your pharmacy.   Lab work: None Ordered  If you have labs (blood work) drawn today and your tests are completely normal, you will receive your results only by: . MyChart Message (if you have MyChart) OR . A paper copy in the mail If you have any lab test that is abnormal or we need to change your treatment, we will call you to review the results.  Testing/Procedures: None ordered  Follow-Up: At [facility], you and your health needs are our priority.  As part of our continuing mission to provide you with exceptional heart care, we have created designated Provider Care Teams.  These Care Teams include your primary Cardiologist (physician) and Advanced Practice Providers (APPs -  Physician Assistants and Nurse Practitioners) who all work together to provide you with the care you need, when you need it. . You will need a follow up appointment in 9 months.  Please call our office 2 months in advance to schedule this appointment.  You may see Jay Varanasi, MD or one of the following Advanced Practice Providers on your designated Care Team:   . Brittainy Simmons, PA-C . Dayna Dunn, PA-C . Michele Lenze, PA-C  Any Other Special Instructions Will Be Listed Below (If Applicable).    

## 2018-04-17 DIAGNOSIS — Z6830 Body mass index (BMI) 30.0-30.9, adult: Secondary | ICD-10-CM | POA: Diagnosis not present

## 2018-04-17 DIAGNOSIS — Z124 Encounter for screening for malignant neoplasm of cervix: Secondary | ICD-10-CM | POA: Diagnosis not present

## 2018-04-20 DIAGNOSIS — N201 Calculus of ureter: Secondary | ICD-10-CM | POA: Diagnosis not present

## 2018-04-20 DIAGNOSIS — R310 Gross hematuria: Secondary | ICD-10-CM | POA: Diagnosis not present

## 2018-04-26 ENCOUNTER — Ambulatory Visit (INDEPENDENT_AMBULATORY_CARE_PROVIDER_SITE_OTHER): Payer: Medicare Other | Admitting: *Deleted

## 2018-04-26 DIAGNOSIS — Z5181 Encounter for therapeutic drug level monitoring: Secondary | ICD-10-CM

## 2018-04-26 DIAGNOSIS — I480 Paroxysmal atrial fibrillation: Secondary | ICD-10-CM

## 2018-04-26 LAB — POCT INR: INR: 1.9 — AB (ref 2.0–3.0)

## 2018-04-26 NOTE — Patient Instructions (Addendum)
Description   Today take 1.5 tablets then continue on same dosage 1/2 tablet everyday except 1 tablet on Mondays, Wednesdays, and Fridays. Recheck INR in 5 weeks.  Call with any new medications, any new procedures, and  with any concerns  [phone]

## 2018-05-30 DIAGNOSIS — I10 Essential (primary) hypertension: Secondary | ICD-10-CM | POA: Diagnosis not present

## 2018-05-30 DIAGNOSIS — I5032 Chronic diastolic (congestive) heart failure: Secondary | ICD-10-CM | POA: Diagnosis not present

## 2018-05-30 DIAGNOSIS — I4819 Other persistent atrial fibrillation: Secondary | ICD-10-CM | POA: Diagnosis not present

## 2018-05-30 DIAGNOSIS — I480 Paroxysmal atrial fibrillation: Secondary | ICD-10-CM | POA: Diagnosis not present

## 2018-05-30 DIAGNOSIS — I509 Heart failure, unspecified: Secondary | ICD-10-CM | POA: Diagnosis not present

## 2018-05-30 DIAGNOSIS — M169 Osteoarthritis of hip, unspecified: Secondary | ICD-10-CM | POA: Diagnosis not present

## 2018-05-30 DIAGNOSIS — M179 Osteoarthritis of knee, unspecified: Secondary | ICD-10-CM | POA: Diagnosis not present

## 2018-05-31 ENCOUNTER — Ambulatory Visit (INDEPENDENT_AMBULATORY_CARE_PROVIDER_SITE_OTHER): Payer: Medicare Other | Admitting: Pharmacist

## 2018-05-31 DIAGNOSIS — M25562 Pain in left knee: Secondary | ICD-10-CM | POA: Diagnosis not present

## 2018-05-31 DIAGNOSIS — I480 Paroxysmal atrial fibrillation: Secondary | ICD-10-CM | POA: Diagnosis not present

## 2018-05-31 DIAGNOSIS — Z5181 Encounter for therapeutic drug level monitoring: Secondary | ICD-10-CM

## 2018-05-31 LAB — POCT INR: INR: 2.1 (ref 2.0–3.0)

## 2018-05-31 NOTE — Patient Instructions (Signed)
Continue on same dosage 1/2 tablet everyday except 1 tablet on Mondays, Wednesdays, and Fridays. Recheck INR in 5 weeks.  Call with any new medications, any new procedures, and  with any concerns  [phone]

## 2018-06-02 ENCOUNTER — Other Ambulatory Visit: Payer: Self-pay | Admitting: Interventional Cardiology

## 2018-06-07 DIAGNOSIS — Z85828 Personal history of other malignant neoplasm of skin: Secondary | ICD-10-CM | POA: Diagnosis not present

## 2018-06-07 DIAGNOSIS — L905 Scar conditions and fibrosis of skin: Secondary | ICD-10-CM | POA: Diagnosis not present

## 2018-06-07 DIAGNOSIS — L570 Actinic keratosis: Secondary | ICD-10-CM | POA: Diagnosis not present

## 2018-06-09 DIAGNOSIS — I5032 Chronic diastolic (congestive) heart failure: Secondary | ICD-10-CM | POA: Diagnosis not present

## 2018-06-09 DIAGNOSIS — Z0000 Encounter for general adult medical examination without abnormal findings: Secondary | ICD-10-CM | POA: Diagnosis not present

## 2018-06-09 DIAGNOSIS — I10 Essential (primary) hypertension: Secondary | ICD-10-CM | POA: Diagnosis not present

## 2018-06-09 DIAGNOSIS — E7800 Pure hypercholesterolemia, unspecified: Secondary | ICD-10-CM | POA: Diagnosis not present

## 2018-06-09 DIAGNOSIS — I480 Paroxysmal atrial fibrillation: Secondary | ICD-10-CM | POA: Diagnosis not present

## 2018-06-09 DIAGNOSIS — Z5181 Encounter for therapeutic drug level monitoring: Secondary | ICD-10-CM | POA: Diagnosis not present

## 2018-06-09 DIAGNOSIS — F419 Anxiety disorder, unspecified: Secondary | ICD-10-CM | POA: Diagnosis not present

## 2018-06-09 DIAGNOSIS — Z1389 Encounter for screening for other disorder: Secondary | ICD-10-CM | POA: Diagnosis not present

## 2018-06-09 DIAGNOSIS — K219 Gastro-esophageal reflux disease without esophagitis: Secondary | ICD-10-CM | POA: Diagnosis not present

## 2018-06-29 DIAGNOSIS — I480 Paroxysmal atrial fibrillation: Secondary | ICD-10-CM | POA: Diagnosis not present

## 2018-06-29 DIAGNOSIS — I5032 Chronic diastolic (congestive) heart failure: Secondary | ICD-10-CM | POA: Diagnosis not present

## 2018-06-29 DIAGNOSIS — M169 Osteoarthritis of hip, unspecified: Secondary | ICD-10-CM | POA: Diagnosis not present

## 2018-06-29 DIAGNOSIS — M179 Osteoarthritis of knee, unspecified: Secondary | ICD-10-CM | POA: Diagnosis not present

## 2018-06-29 DIAGNOSIS — I10 Essential (primary) hypertension: Secondary | ICD-10-CM | POA: Diagnosis not present

## 2018-06-29 DIAGNOSIS — I509 Heart failure, unspecified: Secondary | ICD-10-CM | POA: Diagnosis not present

## 2018-06-29 DIAGNOSIS — I4819 Other persistent atrial fibrillation: Secondary | ICD-10-CM | POA: Diagnosis not present

## 2018-07-01 DIAGNOSIS — M84374A Stress fracture, right foot, initial encounter for fracture: Secondary | ICD-10-CM | POA: Diagnosis not present

## 2018-07-05 ENCOUNTER — Ambulatory Visit (INDEPENDENT_AMBULATORY_CARE_PROVIDER_SITE_OTHER): Payer: Medicare Other | Admitting: *Deleted

## 2018-07-05 DIAGNOSIS — Z5181 Encounter for therapeutic drug level monitoring: Secondary | ICD-10-CM

## 2018-07-05 DIAGNOSIS — I480 Paroxysmal atrial fibrillation: Secondary | ICD-10-CM | POA: Diagnosis not present

## 2018-07-05 LAB — POCT INR: INR: 1.8 — AB (ref 2.0–3.0)

## 2018-07-05 NOTE — Patient Instructions (Signed)
Description   Today take another take 1/2 tablet then continue on same dosage 1/2 tablet everyday except 1 tablet on Mondays, Wednesdays, and Fridays. Recheck INR in 4 weeks.  Call with any new medications, any new procedures, and  with any concerns  [phone]

## 2018-07-17 DIAGNOSIS — M25562 Pain in left knee: Secondary | ICD-10-CM | POA: Diagnosis not present

## 2018-07-17 DIAGNOSIS — M79671 Pain in right foot: Secondary | ICD-10-CM | POA: Diagnosis not present

## 2018-08-01 DIAGNOSIS — M25562 Pain in left knee: Secondary | ICD-10-CM | POA: Diagnosis not present

## 2018-08-02 ENCOUNTER — Ambulatory Visit (INDEPENDENT_AMBULATORY_CARE_PROVIDER_SITE_OTHER): Payer: Medicare Other | Admitting: *Deleted

## 2018-08-02 ENCOUNTER — Encounter (INDEPENDENT_AMBULATORY_CARE_PROVIDER_SITE_OTHER): Payer: Self-pay

## 2018-08-02 DIAGNOSIS — I480 Paroxysmal atrial fibrillation: Secondary | ICD-10-CM

## 2018-08-02 DIAGNOSIS — Z5181 Encounter for therapeutic drug level monitoring: Secondary | ICD-10-CM | POA: Diagnosis not present

## 2018-08-02 LAB — POCT INR: INR: 1.9 — AB (ref 2.0–3.0)

## 2018-08-02 NOTE — Patient Instructions (Signed)
Description   Today take another take 1/2 tablet then start taking  1 tablet everyday except 1/2 tablet on Tuesdays, Thursdays, and Saturdays. Recheck INR in 3 weeks.  Call with any new medications, any new procedures, and  with any concerns  [phone]

## 2018-08-07 DIAGNOSIS — M25562 Pain in left knee: Secondary | ICD-10-CM | POA: Diagnosis not present

## 2018-08-16 DIAGNOSIS — H524 Presbyopia: Secondary | ICD-10-CM | POA: Diagnosis not present

## 2018-08-16 DIAGNOSIS — H353132 Nonexudative age-related macular degeneration, bilateral, intermediate dry stage: Secondary | ICD-10-CM | POA: Diagnosis not present

## 2018-08-16 DIAGNOSIS — H04123 Dry eye syndrome of bilateral lacrimal glands: Secondary | ICD-10-CM | POA: Diagnosis not present

## 2018-08-16 DIAGNOSIS — H43813 Vitreous degeneration, bilateral: Secondary | ICD-10-CM | POA: Diagnosis not present

## 2018-08-21 IMAGING — CR DG CHEST 2V
2 series · 2 of 2 positions shown · non-contrast
Comparison: 09/11/2013

CLINICAL DATA: Left flank pain.  History of left ureteral stone.

EXAM:
CHEST  2 VIEW

[x chest ap]
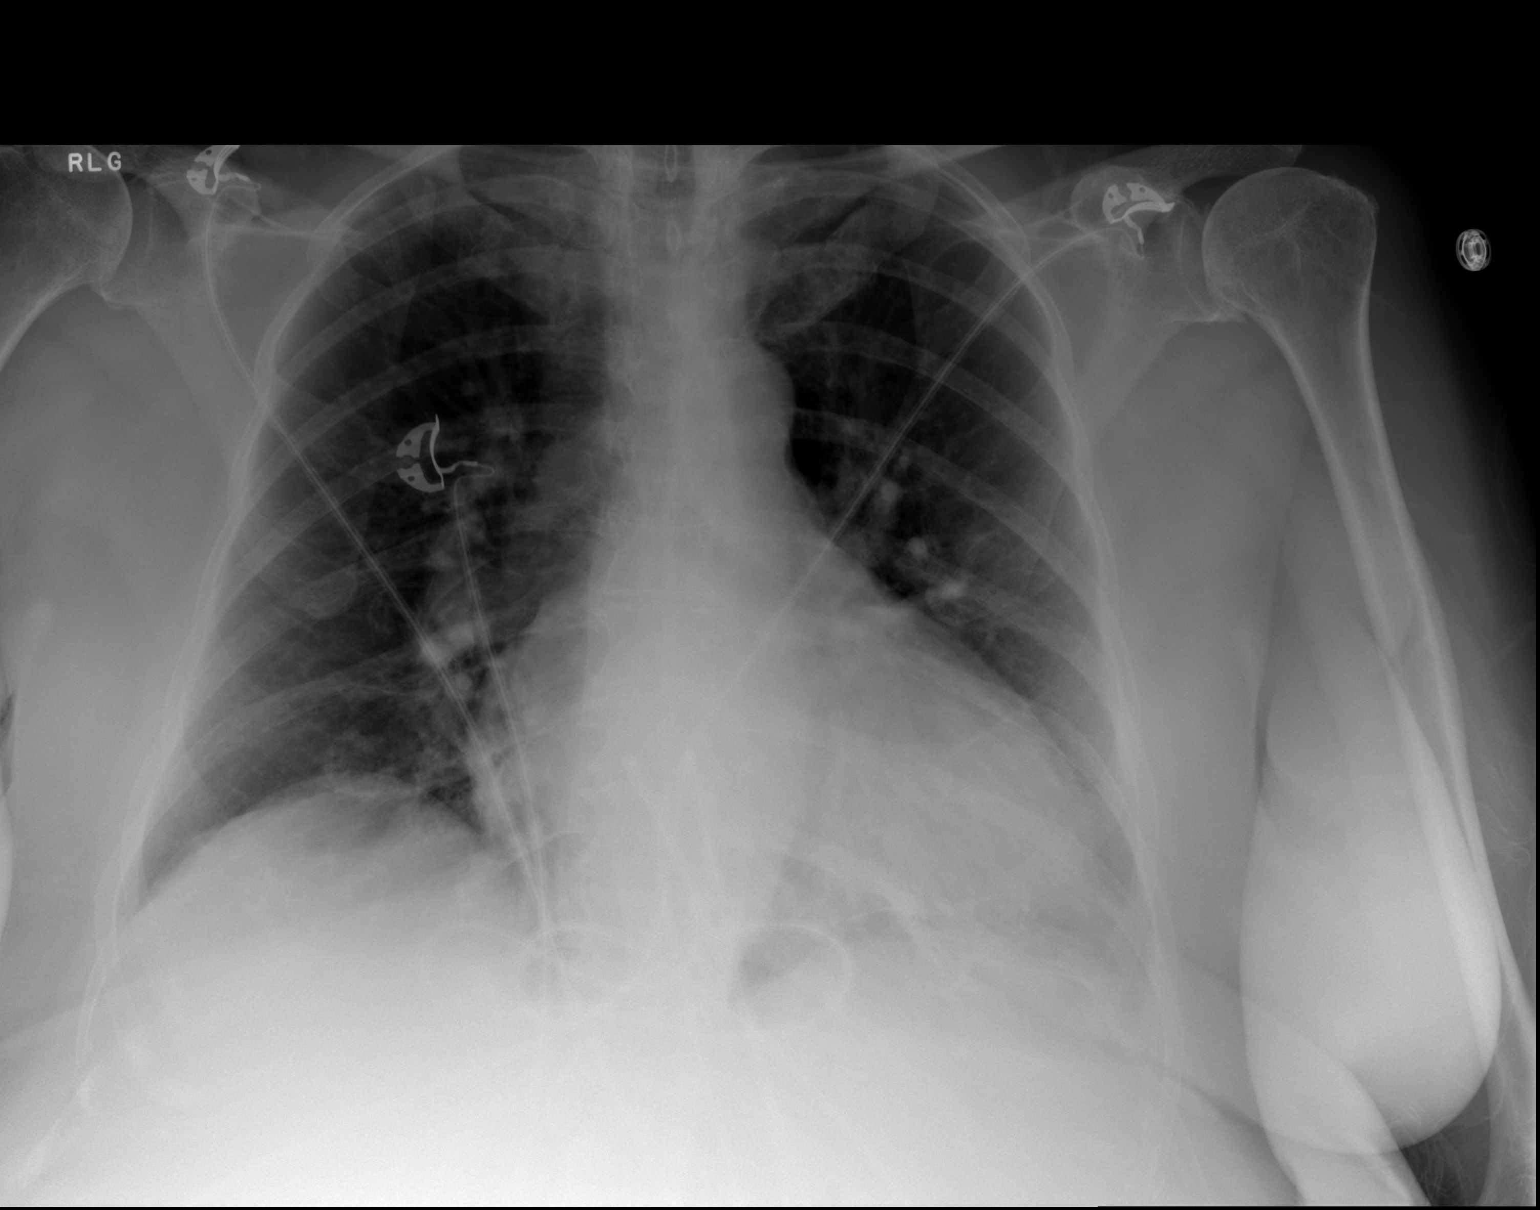

[w chest lat]
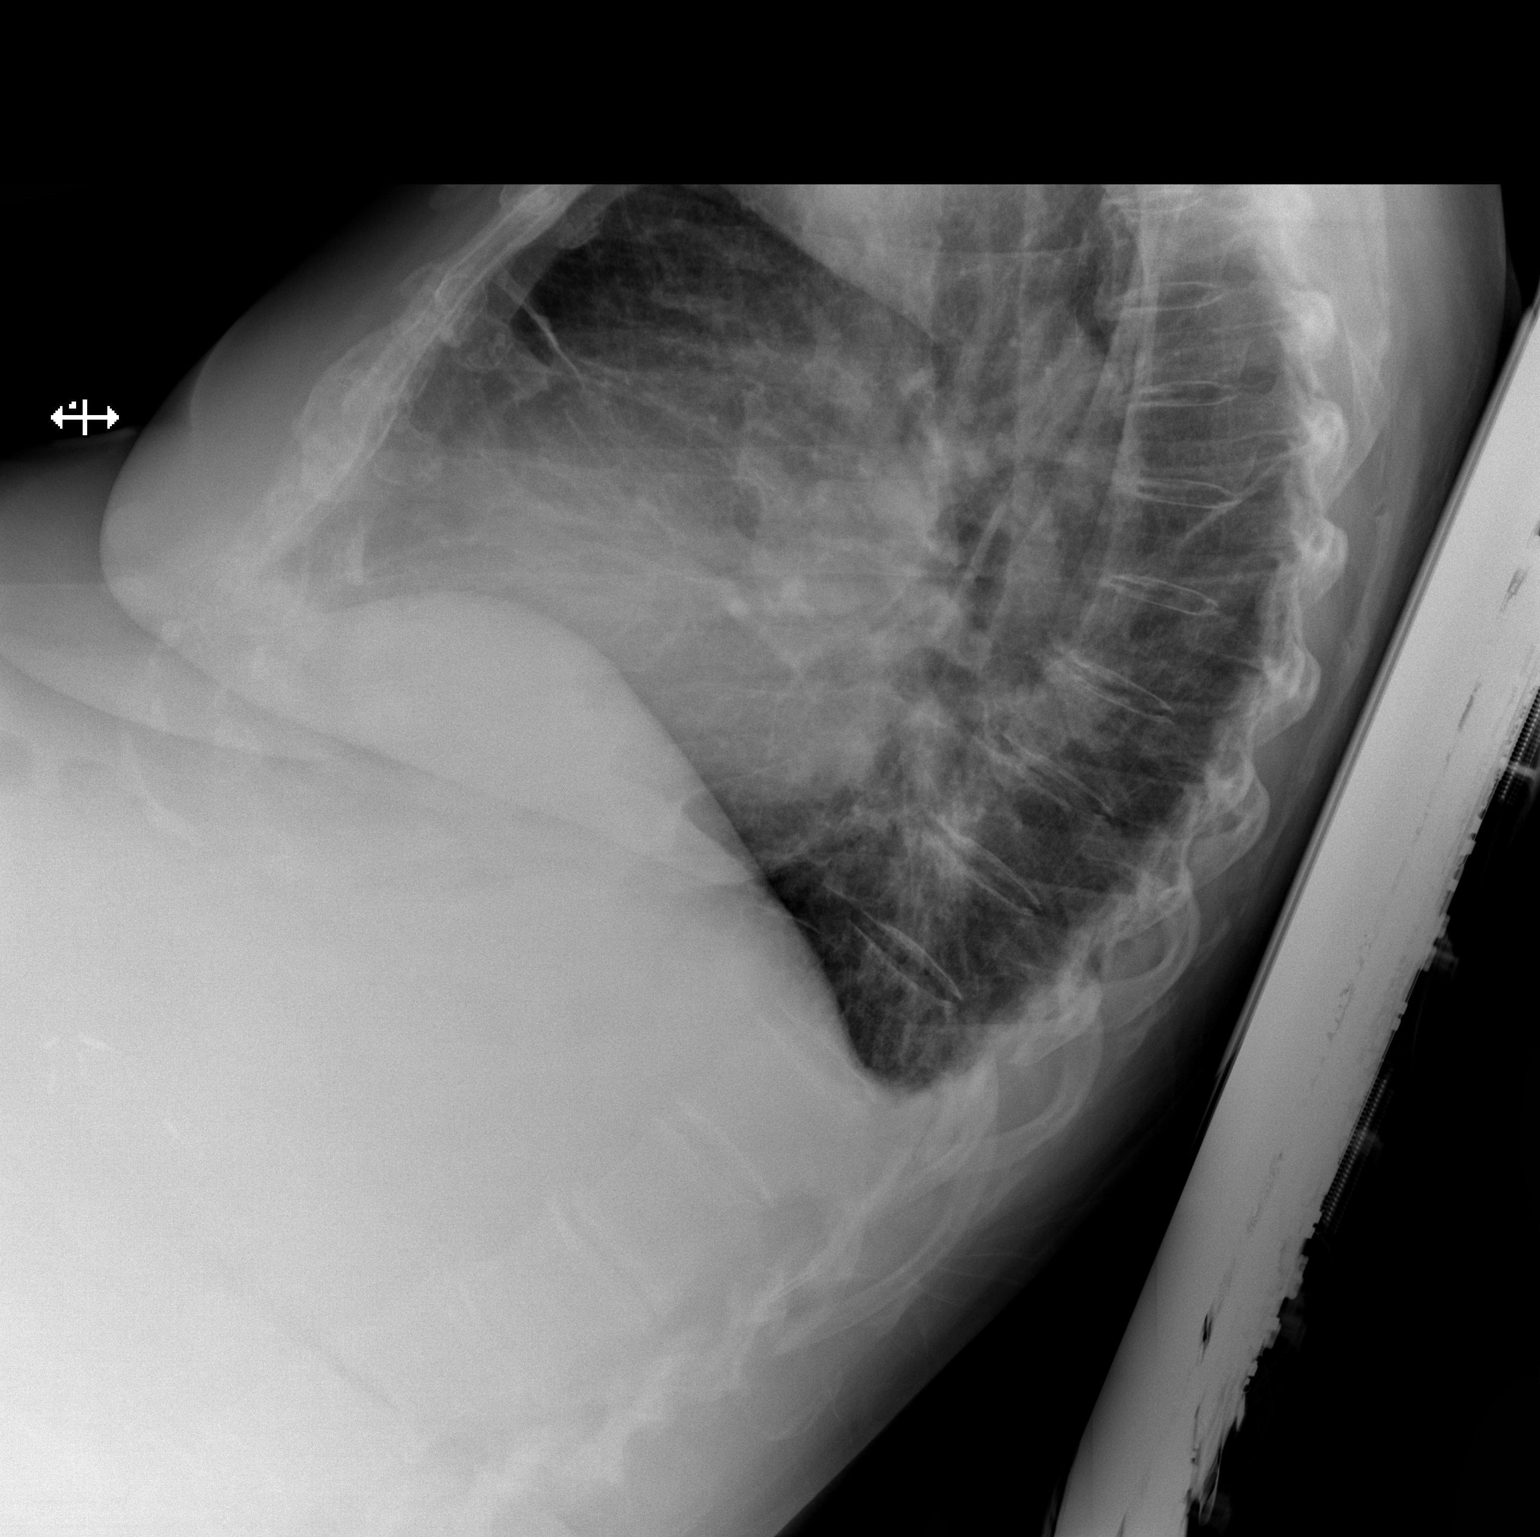

[2 of 2 positions shown; findings below may reference images not displayed]

FINDINGS: Cardiomegaly. No confluent opacities, effusions or edema. No acute
bony abnormality.
IMPRESSION: Cardiomegaly.  No active disease.

## 2018-08-23 ENCOUNTER — Ambulatory Visit (INDEPENDENT_AMBULATORY_CARE_PROVIDER_SITE_OTHER): Payer: Medicare Other | Admitting: *Deleted

## 2018-08-23 DIAGNOSIS — I480 Paroxysmal atrial fibrillation: Secondary | ICD-10-CM

## 2018-08-23 DIAGNOSIS — Z5181 Encounter for therapeutic drug level monitoring: Secondary | ICD-10-CM

## 2018-08-23 LAB — POCT INR: INR: 3.2 — AB (ref 2.0–3.0)

## 2018-08-23 NOTE — Patient Instructions (Signed)
Description   Do not take any Coumadin tomorrow then continue taking 1 tablet everyday except 1/2 tablet on Tuesdays, Thursdays, and Saturdays. Recheck INR in 3 weeks.  Call with any new medications, any new procedures, and  with any concerns  [phone]

## 2018-08-28 DIAGNOSIS — M1712 Unilateral primary osteoarthritis, left knee: Secondary | ICD-10-CM | POA: Diagnosis not present

## 2018-08-28 DIAGNOSIS — M25562 Pain in left knee: Secondary | ICD-10-CM | POA: Diagnosis not present

## 2018-09-04 DIAGNOSIS — M1712 Unilateral primary osteoarthritis, left knee: Secondary | ICD-10-CM | POA: Diagnosis not present

## 2018-09-11 ENCOUNTER — Telehealth: Payer: Self-pay

## 2018-09-11 DIAGNOSIS — M1712 Unilateral primary osteoarthritis, left knee: Secondary | ICD-10-CM | POA: Diagnosis not present

## 2018-09-11 NOTE — Telephone Encounter (Signed)

## 2018-09-13 ENCOUNTER — Ambulatory Visit (INDEPENDENT_AMBULATORY_CARE_PROVIDER_SITE_OTHER): Payer: Medicare Other | Admitting: *Deleted

## 2018-09-13 ENCOUNTER — Other Ambulatory Visit: Payer: Self-pay

## 2018-09-13 DIAGNOSIS — I480 Paroxysmal atrial fibrillation: Secondary | ICD-10-CM

## 2018-09-13 DIAGNOSIS — Z5181 Encounter for therapeutic drug level monitoring: Secondary | ICD-10-CM | POA: Diagnosis not present

## 2018-09-13 LAB — POCT INR: INR: 3 (ref 2.0–3.0)

## 2018-09-13 NOTE — Patient Instructions (Signed)
Description   Since you have taken today's dose, take normal 1/2 tablet tomorrow and 1/2 tablet Friday then resume taking 1 tablet everyday except 1/2 tablet on Tuesdays, Thursdays, and Saturdays. Recheck INR in 4 weeks.  Call with any new medications, any new procedures, and  with any concerns  [phone]

## 2018-10-09 ENCOUNTER — Telehealth: Payer: Self-pay

## 2018-10-09 NOTE — Telephone Encounter (Signed)

## 2018-10-11 ENCOUNTER — Other Ambulatory Visit: Payer: Self-pay

## 2018-10-11 ENCOUNTER — Ambulatory Visit (INDEPENDENT_AMBULATORY_CARE_PROVIDER_SITE_OTHER): Payer: Medicare Other | Admitting: Pharmacist Clinician (PhC)/ Clinical Pharmacy Specialist

## 2018-10-11 DIAGNOSIS — I480 Paroxysmal atrial fibrillation: Secondary | ICD-10-CM | POA: Diagnosis not present

## 2018-10-11 DIAGNOSIS — Z5181 Encounter for therapeutic drug level monitoring: Secondary | ICD-10-CM

## 2018-10-11 LAB — POCT INR: INR: 3.1 — AB (ref 2.0–3.0)

## 2018-11-02 DIAGNOSIS — I5032 Chronic diastolic (congestive) heart failure: Secondary | ICD-10-CM | POA: Diagnosis not present

## 2018-11-02 DIAGNOSIS — E7800 Pure hypercholesterolemia, unspecified: Secondary | ICD-10-CM | POA: Diagnosis not present

## 2018-11-02 DIAGNOSIS — M169 Osteoarthritis of hip, unspecified: Secondary | ICD-10-CM | POA: Diagnosis not present

## 2018-11-02 DIAGNOSIS — I480 Paroxysmal atrial fibrillation: Secondary | ICD-10-CM | POA: Diagnosis not present

## 2018-11-02 DIAGNOSIS — I10 Essential (primary) hypertension: Secondary | ICD-10-CM | POA: Diagnosis not present

## 2018-11-02 DIAGNOSIS — I4819 Other persistent atrial fibrillation: Secondary | ICD-10-CM | POA: Diagnosis not present

## 2018-11-02 DIAGNOSIS — I509 Heart failure, unspecified: Secondary | ICD-10-CM | POA: Diagnosis not present

## 2018-11-02 DIAGNOSIS — M179 Osteoarthritis of knee, unspecified: Secondary | ICD-10-CM | POA: Diagnosis not present

## 2018-11-07 ENCOUNTER — Telehealth: Payer: Self-pay

## 2018-11-07 NOTE — Telephone Encounter (Signed)
lmom for prescreen  

## 2018-11-07 NOTE — Telephone Encounter (Signed)

## 2018-11-08 ENCOUNTER — Other Ambulatory Visit: Payer: Self-pay

## 2018-11-08 ENCOUNTER — Ambulatory Visit (INDEPENDENT_AMBULATORY_CARE_PROVIDER_SITE_OTHER): Payer: Medicare Other

## 2018-11-08 DIAGNOSIS — Z5181 Encounter for therapeutic drug level monitoring: Secondary | ICD-10-CM | POA: Diagnosis not present

## 2018-11-08 DIAGNOSIS — I480 Paroxysmal atrial fibrillation: Secondary | ICD-10-CM | POA: Diagnosis not present

## 2018-11-08 LAB — POCT INR: INR: 2.5 (ref 2.0–3.0)

## 2018-11-08 NOTE — Patient Instructions (Signed)
Description   Called spoke with pt's husband, advised to have pt continue on same dosage 1/2 tablets daily except 1 tablet each Mondays, Wednesdays and Fridays. Recheck INR in 4 weeks.  Call with any new medications, any new procedures, and with any concerns  [phone]

## 2018-11-30 ENCOUNTER — Other Ambulatory Visit: Payer: Self-pay | Admitting: Interventional Cardiology

## 2018-11-30 ENCOUNTER — Telehealth: Payer: Self-pay

## 2018-11-30 NOTE — Telephone Encounter (Signed)
lmom for prescreen  

## 2018-12-04 ENCOUNTER — Telehealth: Payer: Self-pay | Admitting: *Deleted

## 2018-12-04 NOTE — Telephone Encounter (Signed)

## 2018-12-06 ENCOUNTER — Other Ambulatory Visit: Payer: Self-pay

## 2018-12-06 ENCOUNTER — Ambulatory Visit (INDEPENDENT_AMBULATORY_CARE_PROVIDER_SITE_OTHER): Payer: Medicare Other | Admitting: *Deleted

## 2018-12-06 DIAGNOSIS — I480 Paroxysmal atrial fibrillation: Secondary | ICD-10-CM | POA: Diagnosis not present

## 2018-12-06 DIAGNOSIS — Z5181 Encounter for therapeutic drug level monitoring: Secondary | ICD-10-CM | POA: Diagnosis not present

## 2018-12-06 LAB — POCT INR: INR: 2.1 (ref 2.0–3.0)

## 2018-12-06 NOTE — Patient Instructions (Signed)
Description   Continue on same dosage 1/2 tablets daily except 1 tablet each Mondays, Wednesdays and Fridays. Recheck INR in 5 weeks.  Call with any new medications, any new procedures, and with any concerns  [phone]

## 2018-12-07 DIAGNOSIS — L570 Actinic keratosis: Secondary | ICD-10-CM | POA: Diagnosis not present

## 2018-12-07 DIAGNOSIS — Z85828 Personal history of other malignant neoplasm of skin: Secondary | ICD-10-CM | POA: Diagnosis not present

## 2018-12-07 DIAGNOSIS — L821 Other seborrheic keratosis: Secondary | ICD-10-CM | POA: Diagnosis not present

## 2018-12-07 DIAGNOSIS — D1801 Hemangioma of skin and subcutaneous tissue: Secondary | ICD-10-CM | POA: Diagnosis not present

## 2018-12-07 DIAGNOSIS — D225 Melanocytic nevi of trunk: Secondary | ICD-10-CM | POA: Diagnosis not present

## 2018-12-12 DIAGNOSIS — F419 Anxiety disorder, unspecified: Secondary | ICD-10-CM | POA: Diagnosis not present

## 2018-12-12 DIAGNOSIS — I10 Essential (primary) hypertension: Secondary | ICD-10-CM | POA: Diagnosis not present

## 2018-12-12 DIAGNOSIS — M179 Osteoarthritis of knee, unspecified: Secondary | ICD-10-CM | POA: Diagnosis not present

## 2018-12-12 DIAGNOSIS — I5032 Chronic diastolic (congestive) heart failure: Secondary | ICD-10-CM | POA: Diagnosis not present

## 2018-12-12 DIAGNOSIS — K219 Gastro-esophageal reflux disease without esophagitis: Secondary | ICD-10-CM | POA: Diagnosis not present

## 2018-12-18 DIAGNOSIS — E7800 Pure hypercholesterolemia, unspecified: Secondary | ICD-10-CM | POA: Diagnosis not present

## 2018-12-18 DIAGNOSIS — I509 Heart failure, unspecified: Secondary | ICD-10-CM | POA: Diagnosis not present

## 2018-12-18 DIAGNOSIS — I4819 Other persistent atrial fibrillation: Secondary | ICD-10-CM | POA: Diagnosis not present

## 2018-12-18 DIAGNOSIS — I5032 Chronic diastolic (congestive) heart failure: Secondary | ICD-10-CM | POA: Diagnosis not present

## 2018-12-18 DIAGNOSIS — M179 Osteoarthritis of knee, unspecified: Secondary | ICD-10-CM | POA: Diagnosis not present

## 2018-12-18 DIAGNOSIS — I10 Essential (primary) hypertension: Secondary | ICD-10-CM | POA: Diagnosis not present

## 2018-12-18 DIAGNOSIS — M169 Osteoarthritis of hip, unspecified: Secondary | ICD-10-CM | POA: Diagnosis not present

## 2018-12-18 DIAGNOSIS — I480 Paroxysmal atrial fibrillation: Secondary | ICD-10-CM | POA: Diagnosis not present

## 2019-01-08 ENCOUNTER — Telehealth: Payer: Self-pay

## 2019-01-08 NOTE — Telephone Encounter (Signed)

## 2019-01-10 ENCOUNTER — Other Ambulatory Visit: Payer: Self-pay

## 2019-01-10 ENCOUNTER — Ambulatory Visit (INDEPENDENT_AMBULATORY_CARE_PROVIDER_SITE_OTHER): Payer: Medicare Other | Admitting: *Deleted

## 2019-01-10 DIAGNOSIS — Z5181 Encounter for therapeutic drug level monitoring: Secondary | ICD-10-CM | POA: Diagnosis not present

## 2019-01-10 DIAGNOSIS — I480 Paroxysmal atrial fibrillation: Secondary | ICD-10-CM

## 2019-01-10 LAB — POCT INR: INR: 2.3 (ref 2.0–3.0)

## 2019-01-10 NOTE — Patient Instructions (Signed)
Description   Continue on same dosage 1/2 tablet daily except 1 tablet each Mondays, Wednesdays and Fridays. Recheck INR in 6 weeks.  Call with any new medications, any new procedures, and with any concerns  [phone]

## 2019-02-15 DIAGNOSIS — I480 Paroxysmal atrial fibrillation: Secondary | ICD-10-CM | POA: Diagnosis not present

## 2019-02-15 DIAGNOSIS — I509 Heart failure, unspecified: Secondary | ICD-10-CM | POA: Diagnosis not present

## 2019-02-15 DIAGNOSIS — E7800 Pure hypercholesterolemia, unspecified: Secondary | ICD-10-CM | POA: Diagnosis not present

## 2019-02-15 DIAGNOSIS — I4819 Other persistent atrial fibrillation: Secondary | ICD-10-CM | POA: Diagnosis not present

## 2019-02-15 DIAGNOSIS — I10 Essential (primary) hypertension: Secondary | ICD-10-CM | POA: Diagnosis not present

## 2019-02-15 DIAGNOSIS — I5032 Chronic diastolic (congestive) heart failure: Secondary | ICD-10-CM | POA: Diagnosis not present

## 2019-02-15 DIAGNOSIS — M169 Osteoarthritis of hip, unspecified: Secondary | ICD-10-CM | POA: Diagnosis not present

## 2019-02-15 DIAGNOSIS — M179 Osteoarthritis of knee, unspecified: Secondary | ICD-10-CM | POA: Diagnosis not present

## 2019-02-21 ENCOUNTER — Ambulatory Visit (INDEPENDENT_AMBULATORY_CARE_PROVIDER_SITE_OTHER): Payer: Medicare Other

## 2019-02-21 ENCOUNTER — Other Ambulatory Visit: Payer: Self-pay

## 2019-02-21 DIAGNOSIS — Z5181 Encounter for therapeutic drug level monitoring: Secondary | ICD-10-CM | POA: Diagnosis not present

## 2019-02-21 DIAGNOSIS — I480 Paroxysmal atrial fibrillation: Secondary | ICD-10-CM

## 2019-02-21 LAB — POCT INR: INR: 2.2 (ref 2.0–3.0)

## 2019-02-21 NOTE — Patient Instructions (Signed)
Description   Continue on same dosage 1/2 tablet daily except 1 tablet each Mondays, Wednesdays and Fridays. Recheck INR in 6 weeks.  Call with any new medications, any new procedures, and with any concerns  [phone]      

## 2019-03-13 DIAGNOSIS — Z23 Encounter for immunization: Secondary | ICD-10-CM | POA: Diagnosis not present

## 2019-03-16 DIAGNOSIS — E7800 Pure hypercholesterolemia, unspecified: Secondary | ICD-10-CM | POA: Diagnosis not present

## 2019-03-16 DIAGNOSIS — I10 Essential (primary) hypertension: Secondary | ICD-10-CM | POA: Diagnosis not present

## 2019-03-16 DIAGNOSIS — M169 Osteoarthritis of hip, unspecified: Secondary | ICD-10-CM | POA: Diagnosis not present

## 2019-03-16 DIAGNOSIS — I480 Paroxysmal atrial fibrillation: Secondary | ICD-10-CM | POA: Diagnosis not present

## 2019-03-16 DIAGNOSIS — I5032 Chronic diastolic (congestive) heart failure: Secondary | ICD-10-CM | POA: Diagnosis not present

## 2019-03-16 DIAGNOSIS — I4819 Other persistent atrial fibrillation: Secondary | ICD-10-CM | POA: Diagnosis not present

## 2019-03-16 DIAGNOSIS — M179 Osteoarthritis of knee, unspecified: Secondary | ICD-10-CM | POA: Diagnosis not present

## 2019-03-16 DIAGNOSIS — I509 Heart failure, unspecified: Secondary | ICD-10-CM | POA: Diagnosis not present

## 2019-03-29 DIAGNOSIS — Z803 Family history of malignant neoplasm of breast: Secondary | ICD-10-CM | POA: Diagnosis not present

## 2019-03-29 DIAGNOSIS — Z1231 Encounter for screening mammogram for malignant neoplasm of breast: Secondary | ICD-10-CM | POA: Diagnosis not present

## 2019-04-04 ENCOUNTER — Other Ambulatory Visit: Payer: Self-pay

## 2019-04-04 ENCOUNTER — Ambulatory Visit (INDEPENDENT_AMBULATORY_CARE_PROVIDER_SITE_OTHER): Payer: Medicare Other | Admitting: *Deleted

## 2019-04-04 DIAGNOSIS — I480 Paroxysmal atrial fibrillation: Secondary | ICD-10-CM

## 2019-04-04 DIAGNOSIS — Z5181 Encounter for therapeutic drug level monitoring: Secondary | ICD-10-CM | POA: Diagnosis not present

## 2019-04-04 LAB — POCT INR: INR: 2.3 (ref 2.0–3.0)

## 2019-04-04 NOTE — Patient Instructions (Signed)
Description   Continue on same dosage 1/2 tablet daily except 1 tablet each Mondays, Wednesdays and Fridays. Recheck INR in 8 weeks. Call with any new medications, any new procedures, and with any concerns  [phone]     

## 2019-04-05 DIAGNOSIS — M179 Osteoarthritis of knee, unspecified: Secondary | ICD-10-CM | POA: Diagnosis not present

## 2019-04-05 DIAGNOSIS — M169 Osteoarthritis of hip, unspecified: Secondary | ICD-10-CM | POA: Diagnosis not present

## 2019-04-05 DIAGNOSIS — I4819 Other persistent atrial fibrillation: Secondary | ICD-10-CM | POA: Diagnosis not present

## 2019-04-05 DIAGNOSIS — I5032 Chronic diastolic (congestive) heart failure: Secondary | ICD-10-CM | POA: Diagnosis not present

## 2019-04-05 DIAGNOSIS — I509 Heart failure, unspecified: Secondary | ICD-10-CM | POA: Diagnosis not present

## 2019-04-05 DIAGNOSIS — I480 Paroxysmal atrial fibrillation: Secondary | ICD-10-CM | POA: Diagnosis not present

## 2019-04-05 DIAGNOSIS — E7800 Pure hypercholesterolemia, unspecified: Secondary | ICD-10-CM | POA: Diagnosis not present

## 2019-04-05 DIAGNOSIS — I10 Essential (primary) hypertension: Secondary | ICD-10-CM | POA: Diagnosis not present

## 2019-05-30 ENCOUNTER — Other Ambulatory Visit: Payer: Self-pay

## 2019-05-30 ENCOUNTER — Ambulatory Visit (INDEPENDENT_AMBULATORY_CARE_PROVIDER_SITE_OTHER): Payer: Medicare Other | Admitting: *Deleted

## 2019-05-30 DIAGNOSIS — Z5181 Encounter for therapeutic drug level monitoring: Secondary | ICD-10-CM | POA: Diagnosis not present

## 2019-05-30 DIAGNOSIS — I480 Paroxysmal atrial fibrillation: Secondary | ICD-10-CM

## 2019-05-30 LAB — POCT INR: INR: 2.7 (ref 2.0–3.0)

## 2019-05-30 NOTE — Patient Instructions (Signed)
Description   Continue on same dosage 1/2 tablet daily except 1 tablet each Mondays, Wednesdays and Fridays. Recheck INR in 8 weeks. Call with any new medications, any new procedures, and with any concerns  [phone]     

## 2019-07-10 ENCOUNTER — Ambulatory Visit: Payer: Medicare Other | Attending: Internal Medicine

## 2019-07-10 DIAGNOSIS — Z23 Encounter for immunization: Secondary | ICD-10-CM | POA: Diagnosis not present

## 2019-07-10 NOTE — Progress Notes (Signed)
   Covid-19 Vaccination Clinic  Name:  Sierra [name]    MRN: [id number] DOB: 1945/06/24  07/10/2019  Sierra [name] was observed post Covid-19 immunization for 15 minutes without incidence. She was provided with Vaccine Information Sheet and instruction to access the V-Safe system.   Sierra [name] was instructed to call 911 with any severe reactions post vaccine: Marland Kitchen Difficulty breathing  . Swelling of your face and throat  . A fast heartbeat  . A bad rash all over your body  . Dizziness and weakness    Immunizations Administered    Name Date Dose VIS Date Route   Pfizer COVID-19 Vaccine 07/10/2019  1:28 PM 0.3 mL 06/01/2019 Intramuscular   Manufacturer: [employer]   Lot: [id number]   [place]: [id number]

## 2019-07-11 DIAGNOSIS — E7800 Pure hypercholesterolemia, unspecified: Secondary | ICD-10-CM | POA: Diagnosis not present

## 2019-07-11 DIAGNOSIS — I5032 Chronic diastolic (congestive) heart failure: Secondary | ICD-10-CM | POA: Diagnosis not present

## 2019-07-11 DIAGNOSIS — F419 Anxiety disorder, unspecified: Secondary | ICD-10-CM | POA: Diagnosis not present

## 2019-07-11 DIAGNOSIS — I4819 Other persistent atrial fibrillation: Secondary | ICD-10-CM | POA: Diagnosis not present

## 2019-07-11 DIAGNOSIS — Z5181 Encounter for therapeutic drug level monitoring: Secondary | ICD-10-CM | POA: Diagnosis not present

## 2019-07-11 DIAGNOSIS — I10 Essential (primary) hypertension: Secondary | ICD-10-CM | POA: Diagnosis not present

## 2019-07-11 DIAGNOSIS — Z1389 Encounter for screening for other disorder: Secondary | ICD-10-CM | POA: Diagnosis not present

## 2019-07-11 DIAGNOSIS — Z0000 Encounter for general adult medical examination without abnormal findings: Secondary | ICD-10-CM | POA: Diagnosis not present

## 2019-07-11 DIAGNOSIS — K219 Gastro-esophageal reflux disease without esophagitis: Secondary | ICD-10-CM | POA: Diagnosis not present

## 2019-07-17 DIAGNOSIS — I5032 Chronic diastolic (congestive) heart failure: Secondary | ICD-10-CM | POA: Diagnosis not present

## 2019-07-17 DIAGNOSIS — M169 Osteoarthritis of hip, unspecified: Secondary | ICD-10-CM | POA: Diagnosis not present

## 2019-07-17 DIAGNOSIS — I4819 Other persistent atrial fibrillation: Secondary | ICD-10-CM | POA: Diagnosis not present

## 2019-07-17 DIAGNOSIS — I10 Essential (primary) hypertension: Secondary | ICD-10-CM | POA: Diagnosis not present

## 2019-07-17 DIAGNOSIS — M179 Osteoarthritis of knee, unspecified: Secondary | ICD-10-CM | POA: Diagnosis not present

## 2019-07-17 DIAGNOSIS — E7800 Pure hypercholesterolemia, unspecified: Secondary | ICD-10-CM | POA: Diagnosis not present

## 2019-07-17 DIAGNOSIS — I509 Heart failure, unspecified: Secondary | ICD-10-CM | POA: Diagnosis not present

## 2019-07-17 DIAGNOSIS — I480 Paroxysmal atrial fibrillation: Secondary | ICD-10-CM | POA: Diagnosis not present

## 2019-07-25 ENCOUNTER — Other Ambulatory Visit: Payer: Self-pay

## 2019-07-25 ENCOUNTER — Ambulatory Visit (INDEPENDENT_AMBULATORY_CARE_PROVIDER_SITE_OTHER): Payer: Medicare Other | Admitting: *Deleted

## 2019-07-25 DIAGNOSIS — I480 Paroxysmal atrial fibrillation: Secondary | ICD-10-CM

## 2019-07-25 DIAGNOSIS — Z5181 Encounter for therapeutic drug level monitoring: Secondary | ICD-10-CM | POA: Diagnosis not present

## 2019-07-25 LAB — POCT INR: INR: 2.8 (ref 2.0–3.0)

## 2019-07-25 NOTE — Patient Instructions (Signed)
Description   Continue on same dosage 1/2 tablet daily except 1 tablet each Mondays, Wednesdays and Fridays. Recheck INR in 8 weeks. Call with any new medications, any new procedures, and with any concerns  [phone]     

## 2019-07-31 ENCOUNTER — Ambulatory Visit: Payer: Medicare Other | Attending: Internal Medicine

## 2019-07-31 DIAGNOSIS — Z23 Encounter for immunization: Secondary | ICD-10-CM | POA: Insufficient documentation

## 2019-07-31 NOTE — Progress Notes (Signed)
   Covid-19 Vaccination Clinic  Name:  Sierra [name]    MRN: [id number] DOB: Oct 10, 1945  07/31/2019  [name] was observed post Covid-19 immunization for 15 minutes without incidence. She was provided with Vaccine Information Sheet and instruction to access the V-Safe system.   [name] was instructed to call 911 with any severe reactions post vaccine: Marland Kitchen Difficulty breathing  . Swelling of your face and throat  . A fast heartbeat  . A bad rash all over your body  . Dizziness and weakness    Immunizations Administered    Name Date Dose VIS Date Route   Pfizer COVID-19 Vaccine 07/31/2019  2:44 PM 0.3 mL 06/01/2019 Intramuscular   Manufacturer: [place]   Lot: [id number]   [place]: [id number]

## 2019-08-07 DIAGNOSIS — M179 Osteoarthritis of knee, unspecified: Secondary | ICD-10-CM | POA: Diagnosis not present

## 2019-08-07 DIAGNOSIS — E7800 Pure hypercholesterolemia, unspecified: Secondary | ICD-10-CM | POA: Diagnosis not present

## 2019-08-07 DIAGNOSIS — I480 Paroxysmal atrial fibrillation: Secondary | ICD-10-CM | POA: Diagnosis not present

## 2019-08-07 DIAGNOSIS — I5032 Chronic diastolic (congestive) heart failure: Secondary | ICD-10-CM | POA: Diagnosis not present

## 2019-08-07 DIAGNOSIS — I4819 Other persistent atrial fibrillation: Secondary | ICD-10-CM | POA: Diagnosis not present

## 2019-08-07 DIAGNOSIS — I509 Heart failure, unspecified: Secondary | ICD-10-CM | POA: Diagnosis not present

## 2019-08-07 DIAGNOSIS — M169 Osteoarthritis of hip, unspecified: Secondary | ICD-10-CM | POA: Diagnosis not present

## 2019-08-07 DIAGNOSIS — I10 Essential (primary) hypertension: Secondary | ICD-10-CM | POA: Diagnosis not present

## 2019-08-22 DIAGNOSIS — H524 Presbyopia: Secondary | ICD-10-CM | POA: Diagnosis not present

## 2019-08-22 DIAGNOSIS — H353132 Nonexudative age-related macular degeneration, bilateral, intermediate dry stage: Secondary | ICD-10-CM | POA: Diagnosis not present

## 2019-08-22 DIAGNOSIS — H04123 Dry eye syndrome of bilateral lacrimal glands: Secondary | ICD-10-CM | POA: Diagnosis not present

## 2019-08-22 DIAGNOSIS — H43813 Vitreous degeneration, bilateral: Secondary | ICD-10-CM | POA: Diagnosis not present

## 2019-09-19 ENCOUNTER — Other Ambulatory Visit: Payer: Self-pay

## 2019-09-19 ENCOUNTER — Ambulatory Visit (INDEPENDENT_AMBULATORY_CARE_PROVIDER_SITE_OTHER): Payer: Medicare Other | Admitting: Pharmacist

## 2019-09-19 ENCOUNTER — Telehealth: Payer: Self-pay | Admitting: Pharmacist

## 2019-09-19 DIAGNOSIS — Z5181 Encounter for therapeutic drug level monitoring: Secondary | ICD-10-CM

## 2019-09-19 DIAGNOSIS — I480 Paroxysmal atrial fibrillation: Secondary | ICD-10-CM

## 2019-09-19 DIAGNOSIS — I5032 Chronic diastolic (congestive) heart failure: Secondary | ICD-10-CM

## 2019-09-19 LAB — POCT INR: INR: 2.7 (ref 2.0–3.0)

## 2019-09-19 NOTE — Patient Instructions (Signed)
Continue on same dosage 1/2 tablet daily except 1 tablet each Mondays, Wednesdays and Fridays. Recheck INR in 8 weeks. Call with any new medications, any new procedures, and with any concerns  [phone]

## 2019-09-19 NOTE — Telephone Encounter (Signed)
I did speak with patient and advised her to take 2 torsemide tablets (40mg  total) tomorrow AM. I will follow up with her on Friday to see if her weight has gone down.

## 2019-09-19 NOTE — Telephone Encounter (Signed)
I am ok with double the torsemide for a few days.  OK to increase metoprolol ER to 150 mg daily.   JV

## 2019-09-19 NOTE — Telephone Encounter (Signed)
Patient was in office today and stated that she has gained over 5lb since 3/19 (little over a week).  Her dry weight is about 173.  States she took a metolazone on 3/26 - weight dropped only from 180.6 to 178.  SOB only on exertion (laundry, chores around the house) Does not notice any extra swelling anywhere. She has been having short runs of afib for about 4 months now. She takes toresmide 20mg  daily. Patient wondering what she should do since her weight keeps going up and she gained more than 5 lb in a week. Advised that I would discuss with Dr. Irish Lack and get back to her.

## 2019-09-21 MED ORDER — METOPROLOL SUCCINATE ER 100 MG PO TB24: 150.0000 mg | ORAL_TABLET | Freq: Every day | ORAL | 3 refills | Status: DC

## 2019-09-21 NOTE — Telephone Encounter (Signed)
Patient called back stating she has the metoprolol XL 50mg  tablets at home and wants to know if it is ok to take 3 of them. Advised it is ok to take 3 of the 50mg  tablets to equal 150mg . She states she has 3 bottles of these and requested I cancel the rx I sent over earlier.

## 2019-09-21 NOTE — Telephone Encounter (Signed)
Spoke with patient about increasing metoprolol succinate to 150mg  daily. She is in agreement.  She took an extra toresmide (40mg  total yesterday)  Weight went from 178.2lb to 177.6lb today. Advised for her to take 40mg  of torsemide for 2 more days. Will get a BMP on Monday. Will follow up with patient when labs result

## 2019-09-21 NOTE — Addendum Note (Signed)
Addended by: Marcelle Overlie D on: 09/21/2019 02:16 PM   Modules accepted: Orders

## 2019-09-24 ENCOUNTER — Other Ambulatory Visit: Payer: Medicare Other | Admitting: *Deleted

## 2019-09-24 ENCOUNTER — Other Ambulatory Visit: Payer: Self-pay

## 2019-09-24 DIAGNOSIS — I5032 Chronic diastolic (congestive) heart failure: Secondary | ICD-10-CM | POA: Diagnosis not present

## 2019-09-25 LAB — BASIC METABOLIC PANEL
BUN/Creatinine Ratio: 30 — ABNORMAL HIGH (ref 12–28)
BUN: 37 mg/dL — ABNORMAL HIGH (ref 8–27)
CO2: 24 mmol/L (ref 20–29)
Calcium: 9.5 mg/dL (ref 8.7–10.3)
Chloride: 105 mmol/L (ref 96–106)
Creatinine, Ser: 1.23 mg/dL — ABNORMAL HIGH (ref 0.57–1.00)
GFR calc Af Amer: 50 mL/min/{1.73_m2} — ABNORMAL LOW (ref >59)
GFR calc non Af Amer: 43 mL/min/{1.73_m2} — ABNORMAL LOW (ref >59)
Glucose: 72 mg/dL (ref 65–99)
Potassium: 4.6 mmol/L (ref 3.5–5.2)
Sodium: 143 mmol/L (ref 134–144)

## 2019-09-25 NOTE — Telephone Encounter (Signed)
Called and spoke with patient regarding her labs. Scr stable compared to scr of 1.14 in January per [facility]. Labs would suggest she is mildly dehydrated. Appears her dry weight is not really 173lb. I would say it is closer to 176. She states that the increased torsemide dose has not helped much. Her weight for the last 4 days has been 178.2, 177.6, 178.4, 179.6. Advised for her to continue torsemide 20mg  daily. She should increase water intake a little. She denies any SOB, ankle swelling- thinks she has some abdomen swelling but states she "feels fat". Denies abdomen being tight. Her weight over the last 4 days has been stable and she is in no distress. Continue metoprolol 150mg  daily. States she hasn't had any "bursts" since increase.

## 2019-09-27 DIAGNOSIS — E7800 Pure hypercholesterolemia, unspecified: Secondary | ICD-10-CM | POA: Diagnosis not present

## 2019-09-27 DIAGNOSIS — I10 Essential (primary) hypertension: Secondary | ICD-10-CM | POA: Diagnosis not present

## 2019-09-27 DIAGNOSIS — I5032 Chronic diastolic (congestive) heart failure: Secondary | ICD-10-CM | POA: Diagnosis not present

## 2019-09-27 DIAGNOSIS — I4819 Other persistent atrial fibrillation: Secondary | ICD-10-CM | POA: Diagnosis not present

## 2019-09-27 DIAGNOSIS — I509 Heart failure, unspecified: Secondary | ICD-10-CM | POA: Diagnosis not present

## 2019-09-27 DIAGNOSIS — M179 Osteoarthritis of knee, unspecified: Secondary | ICD-10-CM | POA: Diagnosis not present

## 2019-09-27 DIAGNOSIS — M169 Osteoarthritis of hip, unspecified: Secondary | ICD-10-CM | POA: Diagnosis not present

## 2019-10-21 ENCOUNTER — Other Ambulatory Visit: Payer: Self-pay | Admitting: Interventional Cardiology

## 2019-11-09 DIAGNOSIS — M179 Osteoarthritis of knee, unspecified: Secondary | ICD-10-CM | POA: Diagnosis not present

## 2019-11-09 DIAGNOSIS — E7800 Pure hypercholesterolemia, unspecified: Secondary | ICD-10-CM | POA: Diagnosis not present

## 2019-11-09 DIAGNOSIS — I482 Chronic atrial fibrillation, unspecified: Secondary | ICD-10-CM | POA: Diagnosis not present

## 2019-11-09 DIAGNOSIS — I10 Essential (primary) hypertension: Secondary | ICD-10-CM | POA: Diagnosis not present

## 2019-11-09 DIAGNOSIS — I5032 Chronic diastolic (congestive) heart failure: Secondary | ICD-10-CM | POA: Diagnosis not present

## 2019-11-09 DIAGNOSIS — M169 Osteoarthritis of hip, unspecified: Secondary | ICD-10-CM | POA: Diagnosis not present

## 2019-11-09 DIAGNOSIS — I509 Heart failure, unspecified: Secondary | ICD-10-CM | POA: Diagnosis not present

## 2019-11-14 ENCOUNTER — Ambulatory Visit (INDEPENDENT_AMBULATORY_CARE_PROVIDER_SITE_OTHER): Payer: Medicare Other | Admitting: *Deleted

## 2019-11-14 ENCOUNTER — Other Ambulatory Visit: Payer: Self-pay

## 2019-11-14 DIAGNOSIS — Z5181 Encounter for therapeutic drug level monitoring: Secondary | ICD-10-CM

## 2019-11-14 DIAGNOSIS — I480 Paroxysmal atrial fibrillation: Secondary | ICD-10-CM

## 2019-11-14 LAB — POCT INR: INR: 2.3 (ref 2.0–3.0)

## 2019-11-14 NOTE — Patient Instructions (Signed)
Description   Continue on same dosage 1/2 tablet daily except 1 tablet each Mondays, Wednesdays and Fridays. Recheck INR in 8 weeks. Call with any new medications, any new procedures, and with any concerns  [phone]     

## 2020-01-09 ENCOUNTER — Ambulatory Visit (INDEPENDENT_AMBULATORY_CARE_PROVIDER_SITE_OTHER): Payer: Medicare Other | Admitting: *Deleted

## 2020-01-09 ENCOUNTER — Other Ambulatory Visit: Payer: Self-pay

## 2020-01-09 DIAGNOSIS — I480 Paroxysmal atrial fibrillation: Secondary | ICD-10-CM | POA: Diagnosis not present

## 2020-01-09 DIAGNOSIS — Z5181 Encounter for therapeutic drug level monitoring: Secondary | ICD-10-CM | POA: Diagnosis not present

## 2020-01-09 LAB — POCT INR: INR: 3 (ref 2.0–3.0)

## 2020-01-09 NOTE — Patient Instructions (Signed)
Description   Continue on same dosage 1/2 tablet daily except 1 tablet each Mondays, Wednesdays and Fridays. Recheck INR in 8 weeks. Call with any new medications, any new procedures, and with any concerns  [phone]

## 2020-01-14 ENCOUNTER — Telehealth: Payer: Self-pay | Admitting: Interventional Cardiology

## 2020-01-14 DIAGNOSIS — M169 Osteoarthritis of hip, unspecified: Secondary | ICD-10-CM | POA: Diagnosis not present

## 2020-01-14 DIAGNOSIS — I509 Heart failure, unspecified: Secondary | ICD-10-CM | POA: Diagnosis not present

## 2020-01-14 DIAGNOSIS — E7800 Pure hypercholesterolemia, unspecified: Secondary | ICD-10-CM | POA: Diagnosis not present

## 2020-01-14 DIAGNOSIS — I480 Paroxysmal atrial fibrillation: Secondary | ICD-10-CM | POA: Diagnosis not present

## 2020-01-14 DIAGNOSIS — M179 Osteoarthritis of knee, unspecified: Secondary | ICD-10-CM | POA: Diagnosis not present

## 2020-01-14 DIAGNOSIS — I10 Essential (primary) hypertension: Secondary | ICD-10-CM | POA: Diagnosis not present

## 2020-01-14 DIAGNOSIS — I4819 Other persistent atrial fibrillation: Secondary | ICD-10-CM | POA: Diagnosis not present

## 2020-01-14 DIAGNOSIS — I5032 Chronic diastolic (congestive) heart failure: Secondary | ICD-10-CM | POA: Diagnosis not present

## 2020-01-14 MED ORDER — METOPROLOL SUCCINATE ER 50 MG PO TB24: 150.0000 mg | ORAL_TABLET | Freq: Every day | ORAL | 0 refills | Status: DC

## 2020-01-14 NOTE — Telephone Encounter (Signed)
°*  STAT* If patient is at the pharmacy, call can be transferred to refill team.   1. Which medications need to be refilled? (please list name of each medication and dose if known)  metoprolol succinate (TOPROL-XL) 50 MG 24 hr tablet 3 tablets (150 mg total) daily   2. Which pharmacy/location (including street and city if local pharmacy) is medication to be sent to? [id number] - [place] [place] - [address]  3. Do they need a 30 day or 90 day supply? 90   Patient is scheduled to see Dr. Irish Lack 02/15/20

## 2020-01-14 NOTE — Telephone Encounter (Signed)
Pt's medication was sent to pt's pharmacy as requested. Confirmation received.  °

## 2020-01-16 DIAGNOSIS — I4819 Other persistent atrial fibrillation: Secondary | ICD-10-CM | POA: Diagnosis not present

## 2020-01-16 DIAGNOSIS — I10 Essential (primary) hypertension: Secondary | ICD-10-CM | POA: Diagnosis not present

## 2020-01-16 DIAGNOSIS — I5032 Chronic diastolic (congestive) heart failure: Secondary | ICD-10-CM | POA: Diagnosis not present

## 2020-01-16 DIAGNOSIS — Z5181 Encounter for therapeutic drug level monitoring: Secondary | ICD-10-CM | POA: Diagnosis not present

## 2020-02-06 ENCOUNTER — Other Ambulatory Visit: Payer: Self-pay | Admitting: Interventional Cardiology

## 2020-02-14 NOTE — Progress Notes (Signed)
Cardiology Office Note   Date:  02/15/2020   ID:  Sierra, [name] 06-03-1946, MRN [id number]  PCP:  Lavone Orn, MD    No chief complaint on file.  AFib  Wt Readings from Last 3 Encounters:  02/15/20 185 lb (83.9 kg)  03/27/18 191 lb (86.6 kg)  08/09/17 197 lb 6.4 oz (89.5 kg)       History of Present Illness: Sierra [name] is a 74 y.o. female  Who has HTN, PACs for many years. In September2016, she was feeling palpitations thought to be from PACs. In December 2016, she had a severe GI bug and went to the ER. She had atrial fibrillation diagnosed in the ambulance.  She had a complicated urinary infectionin 2018. SHe was hospitalized in evaluated with urology. She had a high fever.   She was started on Coumadin. SHe is not interested in a DOAC.  Prior monitor showed AFib with RVR. We decided to pursue DCCV due to recurrent sx.She had a successful cardioversion on Jul 15, 2017.  She felt PACs, but they are manageable.  No problems when she got her ZCOVID vaccines.  She also got shingles vaccine.   Denies : Chest pain. Dizziness. Leg edema. Nitroglycerin use. Orthopnea. Palpitations. Paroxysmal nocturnal dyspnea. Shortness of breath. Syncope.   Afib resolved after increasing to Metoprolol XL 150 mg daily.  No bleeding problems.     Past Medical History:  Diagnosis Date  . Anxiety   . Arthritis    "joints" (09/19/2013)  . Atrial fibrillation ([facility])   . Basal cell carcinoma of cheek    left  . Chronic lower back pain   . GERD (gastroesophageal reflux disease)   . H/O hiatal hernia   . Hypertension   . Migraine    "haven't had any in a long time" (09/19/2013)  . Pneumonia ~ 2012    Past Surgical History:  Procedure Laterality Date  . BASAL CELL CARCINOMA EXCISION Left    "cheek"  . CARDIOVERSION N/A 07/15/2017   Procedure: CARDIOVERSION;  Surgeon: Acie Fredrickson Wonda Cheng, MD;  Location: [facility];  Service: Cardiovascular;  Laterality: N/A;  .  CHOLECYSTECTOMY  1990's  . COLONOSCOPY WITH PROPOFOL N/A 07/05/2016   Procedure: COLONOSCOPY WITH PROPOFOL;  Surgeon: Garlan Fair, MD;  Location: [facility];  Service: Endoscopy;  Laterality: N/A;  . DILATION AND CURETTAGE OF UTERUS    . HERNIA REPAIR  9678   umbilical  . JOINT REPLACEMENT    . OOPHORECTOMY Bilateral   . REVISION TOTAL HIP ARTHROPLASTY Left 09/19/2013  . SHOULDER ARTHROSCOPY W/ ROTATOR CUFF REPAIR Right   . TMJ ARTHROPLASTY    . TOTAL HIP ARTHROPLASTY Left 2009  . TOTAL HIP ARTHROPLASTY Right 2011  . TOTAL HIP REVISION Left 09/19/2013   Procedure: LEFT TOTAL HIP REVISION;  Surgeon: Kerin Salen, MD;  Location: [facility];  Service: Orthopedics;  Laterality: Left;  . TUBAL LIGATION    . VAGINAL HYSTERECTOMY  1990's     Current Outpatient Medications  Medication Sig Dispense Refill  . amLODipine (NORVASC) 10 MG tablet Take 10 mg by mouth daily.    . Cholecalciferol (VITAMIN D-3) 5000 UNITS TABS Take 5,000 Units by mouth every Monday, Wednesday, and Friday.     . citalopram (CELEXA) 40 MG tablet Take 0.5 tablets (20 mg total) by mouth daily.    . Coenzyme Q10 (CO Q-10) 100 MG CAPS Take 1 capsule by mouth daily.    . COLESTID 5 g  packet Take 5 g by mouth 2 (two) times daily.   3  . fexofenadine (ALLEGRA) 180 MG tablet Take 180 mg by mouth daily.    . Glucomannan 1000 MG CAPS Take 1,000 mg by mouth daily.    . Glucosamine HCl 1000 MG TABS Take 1 tablet by mouth daily.    Marland Kitchen MAGNESIUM CITRATE PO Take 250 mg daily by mouth.    . metolazone (ZAROXOLYN) 5 MG tablet Take 5 mg by mouth daily as needed (Takes is weight gets above 195).   0  . metoprolol succinate (TOPROL-XL) 50 MG 24 hr tablet TAKE 3 TABLETS BY MOUTH EVERY DAY **NEED OFFICE VISIT** 270 tablet 0  . Multiple Vitamins-Minerals (MULTIVITAMIN WITH MINERALS) tablet Take 1 tablet by mouth daily. 50+ for her    . Multiple Vitamins-Minerals (PRESERVISION AREDS 2) CAPS Take 1 capsule by mouth daily.    . nabumetone  (RELAFEN) 500 MG tablet Take 500 mg by mouth 2 (two) times daily. Will stop prior to procedure    . potassium chloride (K-DUR,KLOR-CON) 10 MEQ tablet Take 10 mEq by mouth daily.    . Probiotic Product (ALIGN) 4 MG CAPS Take 1 capsule by mouth daily.    . RABEprazole (ACIPHEX) 20 MG tablet Take 20 mg by mouth daily.     Marland Kitchen terazosin (HYTRIN) 10 MG capsule Take 10 mg by mouth at bedtime.    . torsemide (DEMADEX) 20 MG tablet Take 20 mg by mouth daily.    . valsartan (DIOVAN) 160 MG tablet Take 1 tablet (160 mg total) by mouth daily. 90 tablet 3  . vitamin B-12 (CYANOCOBALAMIN) 500 MCG tablet Take 500 mcg by mouth every Monday, Wednesday, and Friday.     . vitamin C (ASCORBIC ACID) 250 MG tablet Take 250 mg by mouth 3 (three) times a week.     . warfarin (COUMADIN) 5 MG tablet TAKE AS DIRECTED BY COUMADIN CLINIC 80 tablet 1   No current facility-administered medications for this visit.    Allergies:   Ace inhibitors, Codeine, Erythromycin, Iohexol, and Iodinated diagnostic agents    Social History:  The patient  reports that she has never smoked. She has never used smokeless tobacco. She reports current alcohol use. She reports that she does not use drugs.   Family History:  The patient's family history includes Heart attack in her maternal aunt, mother, and paternal uncle.    ROS:  Please see the history of present illness.   Otherwise, review of systems are positive for back and knee pain.   All other systems are reviewed and negative.    PHYSICAL EXAM: VS:  BP 124/70   Pulse 78   Ht 5\' 7"  (1.702 m)   Wt 185 lb (83.9 kg)   SpO2 96%   BMI 28.98 kg/m  , BMI Body mass index is 28.98 kg/m. GEN: Well nourished, well developed, in no acute distress  HEENT: normal  Neck: no JVD, carotid bruits, or masses Cardiac: RRR; no murmurs, rubs, or gallops,no edema  Respiratory:  clear to auscultation bilaterally, normal work of breathing GI: soft, nontender, nondistended, + BS MS: no deformity  or atrophy  Skin: warm and dry, no rash Neuro:  Strength and sensation are intact Psych: euthymic mood, full affect   EKG:   The ekg ordered today demonstrates AFib, rate controlled, nonspecific ST changes   Recent Labs: 09/24/2019: BUN 37; Creatinine, Ser 1.23; Potassium 4.6; Sodium 143   Lipid Panel No results found for: CHOL, TRIG,  HDL, CHOLHDL, VLDL, LDLCALC, LDLDIRECT   Other studies Reviewed: Additional studies/ records that were reviewed today with results demonstrating: old ECG reviewed.   ASSESSMENT AND PLAN:  1. AFib: rate controlled.  Better on metoprolol.  No big spikes in HR that she used to feel.  2. Anticoagulated: Coumadin for stroke prevention.  No bleeding issues.   3. Chronic diastolic heart failure: Appears euvolemic.  4. HTN: The current medical regimen is effective;  continue present plan and medications. 5. Leg edema: stable.  Elevate legs. Using torsemide.  Can use compression stockings.  In cool weather, swelling is less.    Current medicines are reviewed at length with the patient today.  The patient concerns regarding her medicines were addressed.  The following changes have been made:  No change  Labs/ tests ordered today include:  No orders of the defined types were placed in this encounter.   Recommend 150 minutes/week of aerobic exercise Low fat, low carb, high fiber diet recommended  Disposition:   FU in 1 year   Signed, Larae Grooms, MD  02/15/2020 2:10 PM    [facility] [place], [place], [place]  [address] Phone: [phone]; Fax: [phone]

## 2020-02-15 ENCOUNTER — Ambulatory Visit (INDEPENDENT_AMBULATORY_CARE_PROVIDER_SITE_OTHER): Payer: Medicare Other | Admitting: Interventional Cardiology

## 2020-02-15 ENCOUNTER — Encounter: Payer: Self-pay | Admitting: Interventional Cardiology

## 2020-02-15 ENCOUNTER — Other Ambulatory Visit: Payer: Self-pay

## 2020-02-15 VITALS — BP 124/70 | HR 78 | Ht 67.0 in | Wt 185.0 lb

## 2020-02-15 DIAGNOSIS — I480 Paroxysmal atrial fibrillation: Secondary | ICD-10-CM

## 2020-02-15 DIAGNOSIS — R600 Localized edema: Secondary | ICD-10-CM | POA: Diagnosis not present

## 2020-02-15 DIAGNOSIS — Z7901 Long term (current) use of anticoagulants: Secondary | ICD-10-CM | POA: Diagnosis not present

## 2020-02-15 DIAGNOSIS — I5032 Chronic diastolic (congestive) heart failure: Secondary | ICD-10-CM | POA: Diagnosis not present

## 2020-02-15 DIAGNOSIS — I10 Essential (primary) hypertension: Secondary | ICD-10-CM

## 2020-02-15 NOTE — Patient Instructions (Signed)
Medication Instructions:  Your physician recommends that you continue on your current medications as directed. Please refer to the Current Medication list given to you today.  *If you need a refill on your cardiac medications before your next appointment, please call your pharmacy*   Lab Work: None   If you have labs (blood work) drawn today and your tests are completely normal, you will receive your results only by: Marland Kitchen MyChart Message (if you have MyChart) OR . A paper copy in the mail If you have any lab test that is abnormal or we need to change your treatment, we will call you to review the results.   Testing/Procedures: None   Follow-Up: At [facility], you and your health needs are our priority.  As part of our continuing mission to provide you with exceptional heart care, we have created designated Provider Care Teams.  These Care Teams include your primary Cardiologist (physician) and Advanced Practice Providers (APPs -  Physician Assistants and Nurse Practitioners) who all work together to provide you with the care you need, when you need it.  We recommend signing up for the patient portal called "MyChart".  Sign up information is provided on this After Visit Summary.  MyChart is used to connect with patients for Virtual Visits (Telemedicine).  Patients are able to view lab/test results, encounter notes, upcoming appointments, etc.  Non-urgent messages can be sent to your provider as well.   To learn more about what you can do with MyChart, go to [email or web address].    Your next appointment:   12 month(s)  The format for your next appointment:   In Person  Provider:   You may see Larae Grooms, MD or one of the following Advanced Practice Providers on your designated Care Team:    Melina Copa, PA-C  Ermalinda Barrios, PA-C    Other Instructions Elevate legs and wear compression stockings to help with swelling

## 2020-03-05 ENCOUNTER — Ambulatory Visit (INDEPENDENT_AMBULATORY_CARE_PROVIDER_SITE_OTHER): Payer: Medicare Other | Admitting: *Deleted

## 2020-03-05 ENCOUNTER — Other Ambulatory Visit: Payer: Self-pay

## 2020-03-05 DIAGNOSIS — Z5181 Encounter for therapeutic drug level monitoring: Secondary | ICD-10-CM | POA: Diagnosis not present

## 2020-03-05 DIAGNOSIS — I480 Paroxysmal atrial fibrillation: Secondary | ICD-10-CM

## 2020-03-05 LAB — POCT INR: INR: 4.1 — AB (ref 2.0–3.0)

## 2020-03-05 NOTE — Patient Instructions (Signed)
Description   Do not take any Warfarin tomorrow and take 1/2 tablet Friday then continue on same dosage 1/2 tablet daily except 1 tablet each Mondays, Wednesdays and Fridays. Recheck INR in 2 weeks (normally 8 weeks). Call with any new medications, any new procedures, and with any concerns  [phone]

## 2020-03-06 DIAGNOSIS — Z23 Encounter for immunization: Secondary | ICD-10-CM | POA: Diagnosis not present

## 2020-03-19 ENCOUNTER — Ambulatory Visit (INDEPENDENT_AMBULATORY_CARE_PROVIDER_SITE_OTHER): Payer: Medicare Other | Admitting: *Deleted

## 2020-03-19 ENCOUNTER — Other Ambulatory Visit: Payer: Self-pay

## 2020-03-19 DIAGNOSIS — Z5181 Encounter for therapeutic drug level monitoring: Secondary | ICD-10-CM | POA: Diagnosis not present

## 2020-03-19 DIAGNOSIS — I480 Paroxysmal atrial fibrillation: Secondary | ICD-10-CM

## 2020-03-19 LAB — POCT INR: INR: 3.4 — AB (ref 2.0–3.0)

## 2020-03-19 NOTE — Patient Instructions (Addendum)
Description   Do not take any Warfarin today then start taking 1/2 tablet daily except 1 tablet each Mondays and Fridays. Recheck INR in 3 weeks (normally 8 weeks). Call with any new medications, any new procedures, and with any concerns  [phone]

## 2020-03-20 DIAGNOSIS — Z23 Encounter for immunization: Secondary | ICD-10-CM | POA: Diagnosis not present

## 2020-03-31 DIAGNOSIS — I10 Essential (primary) hypertension: Secondary | ICD-10-CM | POA: Diagnosis not present

## 2020-03-31 DIAGNOSIS — M169 Osteoarthritis of hip, unspecified: Secondary | ICD-10-CM | POA: Diagnosis not present

## 2020-03-31 DIAGNOSIS — I480 Paroxysmal atrial fibrillation: Secondary | ICD-10-CM | POA: Diagnosis not present

## 2020-03-31 DIAGNOSIS — M179 Osteoarthritis of knee, unspecified: Secondary | ICD-10-CM | POA: Diagnosis not present

## 2020-03-31 DIAGNOSIS — I509 Heart failure, unspecified: Secondary | ICD-10-CM | POA: Diagnosis not present

## 2020-03-31 DIAGNOSIS — I5032 Chronic diastolic (congestive) heart failure: Secondary | ICD-10-CM | POA: Diagnosis not present

## 2020-03-31 DIAGNOSIS — E7800 Pure hypercholesterolemia, unspecified: Secondary | ICD-10-CM | POA: Diagnosis not present

## 2020-04-03 DIAGNOSIS — Z1231 Encounter for screening mammogram for malignant neoplasm of breast: Secondary | ICD-10-CM | POA: Diagnosis not present

## 2020-04-09 ENCOUNTER — Other Ambulatory Visit: Payer: Self-pay

## 2020-04-09 ENCOUNTER — Ambulatory Visit (INDEPENDENT_AMBULATORY_CARE_PROVIDER_SITE_OTHER): Payer: Medicare Other | Admitting: *Deleted

## 2020-04-09 DIAGNOSIS — I480 Paroxysmal atrial fibrillation: Secondary | ICD-10-CM | POA: Diagnosis not present

## 2020-04-09 DIAGNOSIS — Z5181 Encounter for therapeutic drug level monitoring: Secondary | ICD-10-CM | POA: Diagnosis not present

## 2020-04-09 LAB — POCT INR: INR: 2.7 (ref 2.0–3.0)

## 2020-04-09 NOTE — Patient Instructions (Signed)
Description   Continue taking 1/2 tablet daily except 1 tablet each Mondays and Fridays. Recheck INR in 4 weeks. Call with any new medications, any new procedures, and with any concerns  [phone]

## 2020-05-07 ENCOUNTER — Other Ambulatory Visit: Payer: Self-pay

## 2020-05-07 ENCOUNTER — Ambulatory Visit (INDEPENDENT_AMBULATORY_CARE_PROVIDER_SITE_OTHER): Payer: Medicare Other | Admitting: *Deleted

## 2020-05-07 DIAGNOSIS — Z5181 Encounter for therapeutic drug level monitoring: Secondary | ICD-10-CM | POA: Diagnosis not present

## 2020-05-07 DIAGNOSIS — I480 Paroxysmal atrial fibrillation: Secondary | ICD-10-CM

## 2020-05-07 LAB — POCT INR: INR: 2.5 (ref 2.0–3.0)

## 2020-05-07 NOTE — Patient Instructions (Signed)
Description   Continue taking 1/2 tablet daily except 1 tablet each Mondays and Fridays. Recheck INR in 4 weeks. Call with any new medications, any new procedures, and with any concerns  [phone]

## 2020-05-17 DIAGNOSIS — M25531 Pain in right wrist: Secondary | ICD-10-CM | POA: Diagnosis not present

## 2020-05-17 DIAGNOSIS — M1711 Unilateral primary osteoarthritis, right knee: Secondary | ICD-10-CM | POA: Diagnosis not present

## 2020-05-17 DIAGNOSIS — M25561 Pain in right knee: Secondary | ICD-10-CM | POA: Diagnosis not present

## 2020-05-17 DIAGNOSIS — M79641 Pain in right hand: Secondary | ICD-10-CM | POA: Diagnosis not present

## 2020-05-20 DIAGNOSIS — M179 Osteoarthritis of knee, unspecified: Secondary | ICD-10-CM | POA: Diagnosis not present

## 2020-05-20 DIAGNOSIS — I4819 Other persistent atrial fibrillation: Secondary | ICD-10-CM | POA: Diagnosis not present

## 2020-05-20 DIAGNOSIS — I10 Essential (primary) hypertension: Secondary | ICD-10-CM | POA: Diagnosis not present

## 2020-05-20 DIAGNOSIS — I509 Heart failure, unspecified: Secondary | ICD-10-CM | POA: Diagnosis not present

## 2020-05-20 DIAGNOSIS — E7800 Pure hypercholesterolemia, unspecified: Secondary | ICD-10-CM | POA: Diagnosis not present

## 2020-05-20 DIAGNOSIS — K219 Gastro-esophageal reflux disease without esophagitis: Secondary | ICD-10-CM | POA: Diagnosis not present

## 2020-05-20 DIAGNOSIS — I480 Paroxysmal atrial fibrillation: Secondary | ICD-10-CM | POA: Diagnosis not present

## 2020-05-20 DIAGNOSIS — I5032 Chronic diastolic (congestive) heart failure: Secondary | ICD-10-CM | POA: Diagnosis not present

## 2020-05-20 DIAGNOSIS — M169 Osteoarthritis of hip, unspecified: Secondary | ICD-10-CM | POA: Diagnosis not present

## 2020-05-26 ENCOUNTER — Other Ambulatory Visit: Payer: Self-pay | Admitting: Interventional Cardiology

## 2020-05-27 ENCOUNTER — Other Ambulatory Visit: Payer: Self-pay | Admitting: Interventional Cardiology

## 2020-06-04 ENCOUNTER — Other Ambulatory Visit: Payer: Self-pay

## 2020-06-04 ENCOUNTER — Ambulatory Visit (INDEPENDENT_AMBULATORY_CARE_PROVIDER_SITE_OTHER): Payer: Medicare Other | Admitting: *Deleted

## 2020-06-04 DIAGNOSIS — I480 Paroxysmal atrial fibrillation: Secondary | ICD-10-CM

## 2020-06-04 DIAGNOSIS — Z5181 Encounter for therapeutic drug level monitoring: Secondary | ICD-10-CM | POA: Diagnosis not present

## 2020-06-04 LAB — POCT INR: INR: 2.9 (ref 2.0–3.0)

## 2020-06-04 NOTE — Patient Instructions (Signed)
Description   Continue taking Warfarin 1/2 tablet daily except 1 tablet each Mondays and Fridays. Recheck INR in 6 weeks. Call with any new medications, any new procedures, and with any concerns  [phone]      

## 2020-07-15 DIAGNOSIS — M159 Polyosteoarthritis, unspecified: Secondary | ICD-10-CM | POA: Diagnosis not present

## 2020-07-15 DIAGNOSIS — I5032 Chronic diastolic (congestive) heart failure: Secondary | ICD-10-CM | POA: Diagnosis not present

## 2020-07-15 DIAGNOSIS — K219 Gastro-esophageal reflux disease without esophagitis: Secondary | ICD-10-CM | POA: Diagnosis not present

## 2020-07-15 DIAGNOSIS — F419 Anxiety disorder, unspecified: Secondary | ICD-10-CM | POA: Diagnosis not present

## 2020-07-15 DIAGNOSIS — Z0000 Encounter for general adult medical examination without abnormal findings: Secondary | ICD-10-CM | POA: Diagnosis not present

## 2020-07-15 DIAGNOSIS — N1831 Chronic kidney disease, stage 3a: Secondary | ICD-10-CM | POA: Diagnosis not present

## 2020-07-15 DIAGNOSIS — I10 Essential (primary) hypertension: Secondary | ICD-10-CM | POA: Diagnosis not present

## 2020-07-15 DIAGNOSIS — I480 Paroxysmal atrial fibrillation: Secondary | ICD-10-CM | POA: Diagnosis not present

## 2020-07-16 ENCOUNTER — Other Ambulatory Visit: Payer: Self-pay

## 2020-07-16 ENCOUNTER — Ambulatory Visit (INDEPENDENT_AMBULATORY_CARE_PROVIDER_SITE_OTHER): Payer: Medicare Other | Admitting: *Deleted

## 2020-07-16 DIAGNOSIS — I480 Paroxysmal atrial fibrillation: Secondary | ICD-10-CM

## 2020-07-16 DIAGNOSIS — Z5181 Encounter for therapeutic drug level monitoring: Secondary | ICD-10-CM | POA: Diagnosis not present

## 2020-07-16 LAB — POCT INR: INR: 2.5 (ref 2.0–3.0)

## 2020-07-16 NOTE — Patient Instructions (Signed)
Description   Continue taking Warfarin 1/2 tablet daily except 1 tablet each Mondays and Fridays. Recheck INR in 6 weeks. Call with any new medications, any new procedures, and with any concerns  [phone]      

## 2020-07-30 DIAGNOSIS — N1831 Chronic kidney disease, stage 3a: Secondary | ICD-10-CM | POA: Diagnosis not present

## 2020-08-22 DIAGNOSIS — H18593 Other hereditary corneal dystrophies, bilateral: Secondary | ICD-10-CM | POA: Diagnosis not present

## 2020-08-22 DIAGNOSIS — H524 Presbyopia: Secondary | ICD-10-CM | POA: Diagnosis not present

## 2020-08-22 DIAGNOSIS — H353132 Nonexudative age-related macular degeneration, bilateral, intermediate dry stage: Secondary | ICD-10-CM | POA: Diagnosis not present

## 2020-08-22 DIAGNOSIS — H04123 Dry eye syndrome of bilateral lacrimal glands: Secondary | ICD-10-CM | POA: Diagnosis not present

## 2020-08-27 ENCOUNTER — Ambulatory Visit (INDEPENDENT_AMBULATORY_CARE_PROVIDER_SITE_OTHER): Payer: Medicare Other | Admitting: *Deleted

## 2020-08-27 ENCOUNTER — Other Ambulatory Visit: Payer: Self-pay

## 2020-08-27 DIAGNOSIS — Z5181 Encounter for therapeutic drug level monitoring: Secondary | ICD-10-CM

## 2020-08-27 DIAGNOSIS — I480 Paroxysmal atrial fibrillation: Secondary | ICD-10-CM | POA: Diagnosis not present

## 2020-08-27 LAB — POCT INR: INR: 2.1 (ref 2.0–3.0)

## 2020-08-27 NOTE — Patient Instructions (Addendum)
Description   Today take 1 tablet then continue taking Warfarin 1/2 tablet daily except 1 tablet each Mondays and Fridays. Recheck INR in 6 weeks. Call with any new medications, any new procedures, and with any concerns  [phone]

## 2020-08-28 DIAGNOSIS — M179 Osteoarthritis of knee, unspecified: Secondary | ICD-10-CM | POA: Diagnosis not present

## 2020-09-09 DIAGNOSIS — M159 Polyosteoarthritis, unspecified: Secondary | ICD-10-CM | POA: Diagnosis not present

## 2020-09-09 DIAGNOSIS — I509 Heart failure, unspecified: Secondary | ICD-10-CM | POA: Diagnosis not present

## 2020-09-09 DIAGNOSIS — E7800 Pure hypercholesterolemia, unspecified: Secondary | ICD-10-CM | POA: Diagnosis not present

## 2020-09-09 DIAGNOSIS — I480 Paroxysmal atrial fibrillation: Secondary | ICD-10-CM | POA: Diagnosis not present

## 2020-09-09 DIAGNOSIS — M169 Osteoarthritis of hip, unspecified: Secondary | ICD-10-CM | POA: Diagnosis not present

## 2020-09-09 DIAGNOSIS — I10 Essential (primary) hypertension: Secondary | ICD-10-CM | POA: Diagnosis not present

## 2020-09-09 DIAGNOSIS — N1831 Chronic kidney disease, stage 3a: Secondary | ICD-10-CM | POA: Diagnosis not present

## 2020-09-09 DIAGNOSIS — I5032 Chronic diastolic (congestive) heart failure: Secondary | ICD-10-CM | POA: Diagnosis not present

## 2020-09-09 DIAGNOSIS — K219 Gastro-esophageal reflux disease without esophagitis: Secondary | ICD-10-CM | POA: Diagnosis not present

## 2020-09-09 DIAGNOSIS — M179 Osteoarthritis of knee, unspecified: Secondary | ICD-10-CM | POA: Diagnosis not present

## 2020-10-02 DIAGNOSIS — Z23 Encounter for immunization: Secondary | ICD-10-CM | POA: Diagnosis not present

## 2020-10-08 ENCOUNTER — Ambulatory Visit (INDEPENDENT_AMBULATORY_CARE_PROVIDER_SITE_OTHER): Payer: Medicare Other | Admitting: *Deleted

## 2020-10-08 ENCOUNTER — Other Ambulatory Visit: Payer: Self-pay

## 2020-10-08 DIAGNOSIS — Z5181 Encounter for therapeutic drug level monitoring: Secondary | ICD-10-CM

## 2020-10-08 DIAGNOSIS — I480 Paroxysmal atrial fibrillation: Secondary | ICD-10-CM

## 2020-10-08 LAB — POCT INR: INR: 2.4 (ref 2.0–3.0)

## 2020-10-08 NOTE — Patient Instructions (Signed)
Description   Continue taking Warfarin 1/2 tablet daily except 1 tablet each Mondays and Fridays. Recheck INR in 6 weeks. Call with any new medications, any new procedures, and with any concerns  [phone]

## 2020-11-19 ENCOUNTER — Ambulatory Visit (INDEPENDENT_AMBULATORY_CARE_PROVIDER_SITE_OTHER): Payer: Medicare Other | Admitting: *Deleted

## 2020-11-19 ENCOUNTER — Other Ambulatory Visit: Payer: Self-pay

## 2020-11-19 DIAGNOSIS — Z5181 Encounter for therapeutic drug level monitoring: Secondary | ICD-10-CM | POA: Diagnosis not present

## 2020-11-19 DIAGNOSIS — I480 Paroxysmal atrial fibrillation: Secondary | ICD-10-CM

## 2020-11-19 LAB — POCT INR: INR: 2.2 (ref 2.0–3.0)

## 2020-11-19 NOTE — Patient Instructions (Signed)
Description   Continue taking Warfarin 1/2 tablet daily except 1 tablet each Mondays and Fridays. Recheck INR in 6 weeks. Call with any new medications, any new procedures, and with any concerns  [phone]

## 2020-11-25 DIAGNOSIS — K219 Gastro-esophageal reflux disease without esophagitis: Secondary | ICD-10-CM | POA: Diagnosis not present

## 2020-11-25 DIAGNOSIS — N1831 Chronic kidney disease, stage 3a: Secondary | ICD-10-CM | POA: Diagnosis not present

## 2020-11-25 DIAGNOSIS — M179 Osteoarthritis of knee, unspecified: Secondary | ICD-10-CM | POA: Diagnosis not present

## 2020-11-25 DIAGNOSIS — I5032 Chronic diastolic (congestive) heart failure: Secondary | ICD-10-CM | POA: Diagnosis not present

## 2020-11-25 DIAGNOSIS — I480 Paroxysmal atrial fibrillation: Secondary | ICD-10-CM | POA: Diagnosis not present

## 2020-11-25 DIAGNOSIS — M159 Polyosteoarthritis, unspecified: Secondary | ICD-10-CM | POA: Diagnosis not present

## 2020-11-25 DIAGNOSIS — M169 Osteoarthritis of hip, unspecified: Secondary | ICD-10-CM | POA: Diagnosis not present

## 2020-11-25 DIAGNOSIS — I509 Heart failure, unspecified: Secondary | ICD-10-CM | POA: Diagnosis not present

## 2020-11-25 DIAGNOSIS — I10 Essential (primary) hypertension: Secondary | ICD-10-CM | POA: Diagnosis not present

## 2020-11-25 DIAGNOSIS — E7800 Pure hypercholesterolemia, unspecified: Secondary | ICD-10-CM | POA: Diagnosis not present

## 2020-12-31 ENCOUNTER — Ambulatory Visit (INDEPENDENT_AMBULATORY_CARE_PROVIDER_SITE_OTHER): Payer: Medicare Other | Admitting: *Deleted

## 2020-12-31 ENCOUNTER — Other Ambulatory Visit: Payer: Self-pay

## 2020-12-31 DIAGNOSIS — Z5181 Encounter for therapeutic drug level monitoring: Secondary | ICD-10-CM

## 2020-12-31 DIAGNOSIS — I480 Paroxysmal atrial fibrillation: Secondary | ICD-10-CM

## 2020-12-31 LAB — POCT INR: INR: 2.4 (ref 2.0–3.0)

## 2020-12-31 NOTE — Patient Instructions (Signed)
Description   Continue taking Warfarin 1/2 tablet daily except 1 tablet each Mondays and Fridays. Recheck INR in 6 weeks. Call with any new medications, any new procedures, and with any concerns  [phone]

## 2021-01-06 ENCOUNTER — Other Ambulatory Visit: Payer: Self-pay | Admitting: Interventional Cardiology

## 2021-01-06 NOTE — Telephone Encounter (Signed)
Prescription refill request received for warfarin Lov: varanasi 02/15/2020 Next INR check:8/24 Warfarin tablet strength: 5mg 

## 2021-01-13 DIAGNOSIS — N1831 Chronic kidney disease, stage 3a: Secondary | ICD-10-CM | POA: Diagnosis not present

## 2021-01-13 DIAGNOSIS — M159 Polyosteoarthritis, unspecified: Secondary | ICD-10-CM | POA: Diagnosis not present

## 2021-01-13 DIAGNOSIS — I480 Paroxysmal atrial fibrillation: Secondary | ICD-10-CM | POA: Diagnosis not present

## 2021-01-13 DIAGNOSIS — K219 Gastro-esophageal reflux disease without esophagitis: Secondary | ICD-10-CM | POA: Diagnosis not present

## 2021-01-13 DIAGNOSIS — M169 Osteoarthritis of hip, unspecified: Secondary | ICD-10-CM | POA: Diagnosis not present

## 2021-01-13 DIAGNOSIS — I5032 Chronic diastolic (congestive) heart failure: Secondary | ICD-10-CM | POA: Diagnosis not present

## 2021-01-13 DIAGNOSIS — E7800 Pure hypercholesterolemia, unspecified: Secondary | ICD-10-CM | POA: Diagnosis not present

## 2021-01-13 DIAGNOSIS — I509 Heart failure, unspecified: Secondary | ICD-10-CM | POA: Diagnosis not present

## 2021-01-13 DIAGNOSIS — M179 Osteoarthritis of knee, unspecified: Secondary | ICD-10-CM | POA: Diagnosis not present

## 2021-01-13 DIAGNOSIS — I10 Essential (primary) hypertension: Secondary | ICD-10-CM | POA: Diagnosis not present

## 2021-01-16 DIAGNOSIS — M1711 Unilateral primary osteoarthritis, right knee: Secondary | ICD-10-CM | POA: Diagnosis not present

## 2021-01-16 DIAGNOSIS — M1712 Unilateral primary osteoarthritis, left knee: Secondary | ICD-10-CM | POA: Diagnosis not present

## 2021-01-20 DIAGNOSIS — L814 Other melanin hyperpigmentation: Secondary | ICD-10-CM | POA: Diagnosis not present

## 2021-01-20 DIAGNOSIS — D485 Neoplasm of uncertain behavior of skin: Secondary | ICD-10-CM | POA: Diagnosis not present

## 2021-01-20 DIAGNOSIS — L905 Scar conditions and fibrosis of skin: Secondary | ICD-10-CM | POA: Diagnosis not present

## 2021-01-20 DIAGNOSIS — Z85828 Personal history of other malignant neoplasm of skin: Secondary | ICD-10-CM | POA: Diagnosis not present

## 2021-01-20 DIAGNOSIS — L570 Actinic keratosis: Secondary | ICD-10-CM | POA: Diagnosis not present

## 2021-01-20 DIAGNOSIS — D225 Melanocytic nevi of trunk: Secondary | ICD-10-CM | POA: Diagnosis not present

## 2021-01-20 DIAGNOSIS — C44712 Basal cell carcinoma of skin of right lower limb, including hip: Secondary | ICD-10-CM | POA: Diagnosis not present

## 2021-02-11 ENCOUNTER — Other Ambulatory Visit: Payer: Self-pay

## 2021-02-11 ENCOUNTER — Ambulatory Visit (INDEPENDENT_AMBULATORY_CARE_PROVIDER_SITE_OTHER): Payer: Medicare Other

## 2021-02-11 DIAGNOSIS — Z5181 Encounter for therapeutic drug level monitoring: Secondary | ICD-10-CM

## 2021-02-11 DIAGNOSIS — I480 Paroxysmal atrial fibrillation: Secondary | ICD-10-CM | POA: Diagnosis not present

## 2021-02-11 LAB — POCT INR: INR: 2.8 (ref 2.0–3.0)

## 2021-02-11 NOTE — Patient Instructions (Signed)
Description   Continue taking Warfarin 1/2 tablet daily except 1 tablet each Mondays and Fridays. Recheck INR week prior to procedure on 03/18/21 (usually 6 week). Call with any new medications, any new procedures, and with any concerns  [phone]

## 2021-02-26 DIAGNOSIS — I509 Heart failure, unspecified: Secondary | ICD-10-CM | POA: Diagnosis not present

## 2021-02-26 DIAGNOSIS — M179 Osteoarthritis of knee, unspecified: Secondary | ICD-10-CM | POA: Diagnosis not present

## 2021-02-26 DIAGNOSIS — K219 Gastro-esophageal reflux disease without esophagitis: Secondary | ICD-10-CM | POA: Diagnosis not present

## 2021-02-26 DIAGNOSIS — M169 Osteoarthritis of hip, unspecified: Secondary | ICD-10-CM | POA: Diagnosis not present

## 2021-02-26 DIAGNOSIS — I10 Essential (primary) hypertension: Secondary | ICD-10-CM | POA: Diagnosis not present

## 2021-02-26 DIAGNOSIS — N1831 Chronic kidney disease, stage 3a: Secondary | ICD-10-CM | POA: Diagnosis not present

## 2021-02-26 DIAGNOSIS — I480 Paroxysmal atrial fibrillation: Secondary | ICD-10-CM | POA: Diagnosis not present

## 2021-02-26 DIAGNOSIS — I5032 Chronic diastolic (congestive) heart failure: Secondary | ICD-10-CM | POA: Diagnosis not present

## 2021-02-26 DIAGNOSIS — E7800 Pure hypercholesterolemia, unspecified: Secondary | ICD-10-CM | POA: Diagnosis not present

## 2021-03-13 ENCOUNTER — Other Ambulatory Visit: Payer: Self-pay | Admitting: Interventional Cardiology

## 2021-03-13 ENCOUNTER — Other Ambulatory Visit: Payer: Self-pay

## 2021-03-13 ENCOUNTER — Ambulatory Visit (INDEPENDENT_AMBULATORY_CARE_PROVIDER_SITE_OTHER): Payer: Medicare Other

## 2021-03-13 DIAGNOSIS — I480 Paroxysmal atrial fibrillation: Secondary | ICD-10-CM | POA: Diagnosis not present

## 2021-03-13 DIAGNOSIS — Z5181 Encounter for therapeutic drug level monitoring: Secondary | ICD-10-CM

## 2021-03-13 LAB — POCT INR: INR: 2 (ref 2.0–3.0)

## 2021-03-13 NOTE — Patient Instructions (Signed)
Description   Continue taking Warfarin 1/2 tablet daily except 1 tablet each Mondays and Fridays. Recheck INR in 6 weeks.  Fax results to Dr Karin Golden [phone] prior to procedure on 03/18/21.  Call with any new medications, any new procedures, and with any concerns  [phone]

## 2021-03-18 DIAGNOSIS — C44712 Basal cell carcinoma of skin of right lower limb, including hip: Secondary | ICD-10-CM | POA: Diagnosis not present

## 2021-03-18 DIAGNOSIS — I872 Venous insufficiency (chronic) (peripheral): Secondary | ICD-10-CM | POA: Diagnosis not present

## 2021-03-25 ENCOUNTER — Encounter: Payer: Self-pay | Admitting: Interventional Cardiology

## 2021-03-25 ENCOUNTER — Other Ambulatory Visit: Payer: Self-pay

## 2021-03-25 ENCOUNTER — Ambulatory Visit (INDEPENDENT_AMBULATORY_CARE_PROVIDER_SITE_OTHER): Payer: Medicare Other | Admitting: Interventional Cardiology

## 2021-03-25 VITALS — BP 136/72 | HR 86 | Ht 67.0 in | Wt 182.8 lb

## 2021-03-25 DIAGNOSIS — I480 Paroxysmal atrial fibrillation: Secondary | ICD-10-CM | POA: Diagnosis not present

## 2021-03-25 DIAGNOSIS — I5032 Chronic diastolic (congestive) heart failure: Secondary | ICD-10-CM

## 2021-03-25 DIAGNOSIS — R600 Localized edema: Secondary | ICD-10-CM | POA: Diagnosis not present

## 2021-03-25 DIAGNOSIS — Z7901 Long term (current) use of anticoagulants: Secondary | ICD-10-CM

## 2021-03-25 DIAGNOSIS — I10 Essential (primary) hypertension: Secondary | ICD-10-CM

## 2021-03-25 NOTE — Patient Instructions (Signed)

## 2021-03-25 NOTE — Progress Notes (Signed)
Cardiology Office Note   Date:  03/25/2021   ID:  [name], [name] 04-13-1946, MRN [id number]  PCP:  Lavone Orn, MD    No chief complaint on file.    Wt Readings from Last 3 Encounters:  03/25/21 182 lb 12.8 oz (82.9 kg)  02/15/20 185 lb (83.9 kg)  03/27/18 191 lb (86.6 kg)       History of Present Illness: Sierra [name] is a 75 y.o. female  Who has HTN, PACs for many years.  In September 2016, she was feeling palpitations thought to be from PACs.  In December 2016, she had a severe GI bug and went to the ER.  She had atrial fibrillation diagnosed in the ambulance.     She had a complicated urinary infection in 2018.  SHe was hospitalized in evaluated with urology.  She had a high fever.    She was started on Coumadin.  SHe is not interested in a DOAC.   Prior monitor showed AFib with RVR.  We decided to pursue DCCV due to recurrent sx. She had a successful cardioversion on Jul 15, 2017.   She felt PACs, but they are manageable.     No problems when she got her COVID vaccines.  She also got shingles vaccine.   Afib resolved after increasing to Metoprolol XL 150 mg daily.  No bleeding problems.   Denies : Chest pain. Dizziness. Leg edema. Nitroglycerin use. Orthopnea. Palpitations. Paroxysmal nocturnal dyspnea. Shortness of breath. Syncope.    Past Medical History:  Diagnosis Date   Anxiety    Arthritis    "joints" (09/19/2013)   Atrial fibrillation ([facility])    Basal cell carcinoma of cheek    left   Chronic lower back pain    GERD (gastroesophageal reflux disease)    H/O hiatal hernia    Hypertension    Migraine    "haven't had any in a long time" (09/19/2013)   Pneumonia ~ 2012    Past Surgical History:  Procedure Laterality Date   BASAL CELL CARCINOMA EXCISION Left    "cheek"   CARDIOVERSION N/A 07/15/2017   Procedure: CARDIOVERSION;  Surgeon: Thayer Headings, MD;  Location: [facility];  Service: Cardiovascular;  Laterality: N/A;   CHOLECYSTECTOMY   1990's   COLONOSCOPY WITH PROPOFOL N/A 07/05/2016   Procedure: COLONOSCOPY WITH PROPOFOL;  Surgeon: Garlan Fair, MD;  [facility];  Service: Endoscopy;  Laterality: N/A;   DILATION AND CURETTAGE OF UTERUS     HERNIA REPAIR  2376   umbilical   JOINT REPLACEMENT     OOPHORECTOMY Bilateral    REVISION TOTAL HIP ARTHROPLASTY Left 09/19/2013   SHOULDER ARTHROSCOPY W/ ROTATOR CUFF REPAIR Right    TMJ ARTHROPLASTY     TOTAL HIP ARTHROPLASTY Left 2009   TOTAL HIP ARTHROPLASTY Right 2011   TOTAL HIP REVISION Left 09/19/2013   Procedure: LEFT TOTAL HIP REVISION;  Surgeon: Kerin Salen, MD;  Location: [facility];  Service: Orthopedics;  Laterality: Left;   TUBAL LIGATION     VAGINAL HYSTERECTOMY  1990's     Current Outpatient Medications  Medication Sig Dispense Refill   amLODipine (NORVASC) 5 MG tablet Take 5 mg by mouth daily.     Cholecalciferol (VITAMIN D-3) 5000 UNITS TABS Take 5,000 Units by mouth every Monday, Wednesday, and Friday.      citalopram (CELEXA) 40 MG tablet Take 0.5 tablets (20 mg total) by mouth daily.     Coenzyme Q10 (  CO Q-10) 100 MG CAPS Take 1 capsule by mouth daily.     COLESTID 5 g packet Take 5 g by mouth 2 (two) times daily.   3   fexofenadine (ALLEGRA) 180 MG tablet Take 180 mg by mouth daily.     Glucomannan 1000 MG CAPS Take 1,000 mg by mouth daily.     Glucosamine HCl 1000 MG TABS Take 1 tablet by mouth daily.     MAGNESIUM CITRATE PO Take 250 mg daily by mouth.     metolazone (ZAROXOLYN) 5 MG tablet Take 5 mg by mouth daily as needed (Takes is weight gets above 195).   0   metoprolol succinate (TOPROL-XL) 50 MG 24 hr tablet Take 3 tablets (150 mg total) by mouth daily. 270 tablet 2   Multiple Vitamins-Minerals (MULTIVITAMIN WITH MINERALS) tablet Take 1 tablet by mouth daily. 50+ for her     Multiple Vitamins-Minerals (PRESERVISION AREDS 2) CAPS Take 1 capsule by mouth daily.     potassium chloride (K-DUR,KLOR-CON) 10 MEQ tablet Take 10 mEq by mouth  daily.     RABEprazole (ACIPHEX) 20 MG tablet Take 20 mg by mouth daily.      terazosin (HYTRIN) 10 MG capsule Take 10 mg by mouth at bedtime.     torsemide (DEMADEX) 20 MG tablet Take 20 mg by mouth daily.     traMADol (ULTRAM) 50 MG tablet Take 50 mg by mouth 2 (two) times daily as needed.     valsartan (DIOVAN) 160 MG tablet Take 1 tablet (160 mg total) by mouth daily. 90 tablet 3   vitamin B-12 (CYANOCOBALAMIN) 500 MCG tablet Take 500 mcg by mouth every Monday, Wednesday, and Friday.      vitamin C (ASCORBIC ACID) 250 MG tablet Take 250 mg by mouth 3 (three) times a week.      warfarin (COUMADIN) 5 MG tablet TAKE AS DIRECTED BY COUMADIN CLINIC 80 tablet 1   amLODipine (NORVASC) 10 MG tablet Take 10 mg by mouth daily. (Patient not taking: Reported on 03/25/2021)     nabumetone (RELAFEN) 500 MG tablet Take 500 mg by mouth 2 (two) times daily. Will stop prior to procedure (Patient not taking: Reported on 03/25/2021)     Probiotic Product (ALIGN) 4 MG CAPS Take 1 capsule by mouth daily. (Patient not taking: Reported on 03/25/2021)     No current facility-administered medications for this visit.    Allergies:   Ace inhibitors, Codeine, Erythromycin, Iohexol, and Iodinated diagnostic agents    Social History:  The patient  reports that she has never smoked. She has never used smokeless tobacco. She reports current alcohol use. She reports that she does not use drugs.   Family History:  The patient's family history includes Heart attack in her maternal aunt, mother, and paternal uncle.    ROS:  Please see the history of present illness.   Otherwise, review of systems are positive for palpitations-more PACs.   All other systems are reviewed and negative.    PHYSICAL EXAM: VS:  BP 136/72   Pulse 86   Ht 5\' 7"  (1.702 m)   Wt 182 lb 12.8 oz (82.9 kg)   SpO2 97%   BMI 28.63 kg/m  , BMI Body mass index is 28.63 kg/m. GEN: Well nourished, well developed, in no acute distress HEENT:  normal Neck: no JVD, carotid bruits, or masses Cardiac: irregularly irregular; no murmurs, rubs, or gallops,;  tr LE  edema  Respiratory:  clear to auscultation bilaterally, normal  work of breathing GI: soft, nontender, nondistended, + BS MS: no deformity or atrophy Skin: warm and dry, no rash Neuro:  Strength and sensation are intact Psych: euthymic mood, full affect   EKG:   The ekg ordered today demonstrates AFib, rate controlled   Recent Labs: No results found for requested labs within last 8760 hours.   Lipid Panel No results found for: CHOL, TRIG, HDL, CHOLHDL, VLDL, LDLCALC, LDLDIRECT   Other studies Reviewed: Additional studies/ records that were reviewed today with results demonstrating: Hbg 12.3 in Jan 2022; Normal renal function.  .   ASSESSMENT AND PLAN:  AFib: Sx controlled with the meds.  Coumadin for stroke prevention. No bleeding issues.  Knee pain limits walking. Anticoagulated: As above Chronic diastolic heart failure: Using torsemide. Appears euvolemic. Rare metolazone use.  Once in several months time. Low salt diet.  Diuretics based on weight.  HTN: The current medical regimen is effective;  continue present plan and medications. Leg edema: Elevate legs. Compression stockings as well.    Current medicines are reviewed at length with the patient today.  The patient concerns regarding her medicines were addressed.  The following changes have been made:  No change  Labs/ tests ordered today include:  No orders of the defined types were placed in this encounter.   Recommend 150 minutes/week of aerobic exercise Low fat, low carb, high fiber diet recommended  Disposition:   FU in 1 year   Signed, Larae Grooms, MD  03/25/2021 3:29 PM    [facility] [place], [place], [place]  [address] Phone: [phone]; Fax: [phone]

## 2021-03-27 DIAGNOSIS — M1711 Unilateral primary osteoarthritis, right knee: Secondary | ICD-10-CM | POA: Diagnosis not present

## 2021-03-27 DIAGNOSIS — M1712 Unilateral primary osteoarthritis, left knee: Secondary | ICD-10-CM | POA: Diagnosis not present

## 2021-04-01 DIAGNOSIS — D485 Neoplasm of uncertain behavior of skin: Secondary | ICD-10-CM | POA: Diagnosis not present

## 2021-04-06 DIAGNOSIS — M1712 Unilateral primary osteoarthritis, left knee: Secondary | ICD-10-CM | POA: Diagnosis not present

## 2021-04-06 DIAGNOSIS — M1711 Unilateral primary osteoarthritis, right knee: Secondary | ICD-10-CM | POA: Diagnosis not present

## 2021-04-13 DIAGNOSIS — M1711 Unilateral primary osteoarthritis, right knee: Secondary | ICD-10-CM | POA: Diagnosis not present

## 2021-04-13 DIAGNOSIS — C44729 Squamous cell carcinoma of skin of left lower limb, including hip: Secondary | ICD-10-CM | POA: Diagnosis not present

## 2021-04-13 DIAGNOSIS — L570 Actinic keratosis: Secondary | ICD-10-CM | POA: Diagnosis not present

## 2021-04-13 DIAGNOSIS — D492 Neoplasm of unspecified behavior of bone, soft tissue, and skin: Secondary | ICD-10-CM | POA: Diagnosis not present

## 2021-04-13 DIAGNOSIS — M1712 Unilateral primary osteoarthritis, left knee: Secondary | ICD-10-CM | POA: Diagnosis not present

## 2021-04-13 DIAGNOSIS — M542 Cervicalgia: Secondary | ICD-10-CM | POA: Diagnosis not present

## 2021-04-13 DIAGNOSIS — L538 Other specified erythematous conditions: Secondary | ICD-10-CM | POA: Diagnosis not present

## 2021-04-22 DIAGNOSIS — M47812 Spondylosis without myelopathy or radiculopathy, cervical region: Secondary | ICD-10-CM | POA: Diagnosis not present

## 2021-04-24 ENCOUNTER — Other Ambulatory Visit: Payer: Self-pay

## 2021-04-24 ENCOUNTER — Ambulatory Visit (INDEPENDENT_AMBULATORY_CARE_PROVIDER_SITE_OTHER): Payer: Medicare Other

## 2021-04-24 DIAGNOSIS — I480 Paroxysmal atrial fibrillation: Secondary | ICD-10-CM | POA: Diagnosis not present

## 2021-04-24 DIAGNOSIS — Z5181 Encounter for therapeutic drug level monitoring: Secondary | ICD-10-CM

## 2021-04-24 LAB — POCT INR: INR: 2.2 (ref 2.0–3.0)

## 2021-04-24 NOTE — Patient Instructions (Signed)
Description   Continue taking Warfarin 1/2 tablet daily except 1 tablet each Mondays and Fridays. Recheck INR in 6 weeks.  Fax results to Dr Karin Golden [phone] prior to procedure on 03/18/21.  Call with any new medications, any new procedures, and with any concerns  [phone]

## 2021-04-29 DIAGNOSIS — M47812 Spondylosis without myelopathy or radiculopathy, cervical region: Secondary | ICD-10-CM | POA: Diagnosis not present

## 2021-04-30 DIAGNOSIS — C44321 Squamous cell carcinoma of skin of nose: Secondary | ICD-10-CM | POA: Diagnosis not present

## 2021-04-30 DIAGNOSIS — I872 Venous insufficiency (chronic) (peripheral): Secondary | ICD-10-CM | POA: Diagnosis not present

## 2021-05-04 DIAGNOSIS — M47812 Spondylosis without myelopathy or radiculopathy, cervical region: Secondary | ICD-10-CM | POA: Diagnosis not present

## 2021-05-06 DIAGNOSIS — M47812 Spondylosis without myelopathy or radiculopathy, cervical region: Secondary | ICD-10-CM | POA: Diagnosis not present

## 2021-05-08 DIAGNOSIS — M47812 Spondylosis without myelopathy or radiculopathy, cervical region: Secondary | ICD-10-CM | POA: Diagnosis not present

## 2021-05-11 DIAGNOSIS — M47812 Spondylosis without myelopathy or radiculopathy, cervical region: Secondary | ICD-10-CM | POA: Diagnosis not present

## 2021-05-13 DIAGNOSIS — M47812 Spondylosis without myelopathy or radiculopathy, cervical region: Secondary | ICD-10-CM | POA: Diagnosis not present

## 2021-05-16 DIAGNOSIS — M79672 Pain in left foot: Secondary | ICD-10-CM | POA: Diagnosis not present

## 2021-05-19 DIAGNOSIS — I5032 Chronic diastolic (congestive) heart failure: Secondary | ICD-10-CM | POA: Diagnosis not present

## 2021-05-19 DIAGNOSIS — I10 Essential (primary) hypertension: Secondary | ICD-10-CM | POA: Diagnosis not present

## 2021-05-19 DIAGNOSIS — E7800 Pure hypercholesterolemia, unspecified: Secondary | ICD-10-CM | POA: Diagnosis not present

## 2021-05-19 DIAGNOSIS — K219 Gastro-esophageal reflux disease without esophagitis: Secondary | ICD-10-CM | POA: Diagnosis not present

## 2021-05-19 DIAGNOSIS — N1831 Chronic kidney disease, stage 3a: Secondary | ICD-10-CM | POA: Diagnosis not present

## 2021-05-19 DIAGNOSIS — M159 Polyosteoarthritis, unspecified: Secondary | ICD-10-CM | POA: Diagnosis not present

## 2021-05-19 DIAGNOSIS — I480 Paroxysmal atrial fibrillation: Secondary | ICD-10-CM | POA: Diagnosis not present

## 2021-05-27 DIAGNOSIS — M1711 Unilateral primary osteoarthritis, right knee: Secondary | ICD-10-CM | POA: Diagnosis not present

## 2021-05-27 DIAGNOSIS — M47812 Spondylosis without myelopathy or radiculopathy, cervical region: Secondary | ICD-10-CM | POA: Diagnosis not present

## 2021-05-27 DIAGNOSIS — M1712 Unilateral primary osteoarthritis, left knee: Secondary | ICD-10-CM | POA: Diagnosis not present

## 2021-05-29 DIAGNOSIS — M47812 Spondylosis without myelopathy or radiculopathy, cervical region: Secondary | ICD-10-CM | POA: Diagnosis not present

## 2021-06-02 DIAGNOSIS — M47812 Spondylosis without myelopathy or radiculopathy, cervical region: Secondary | ICD-10-CM | POA: Diagnosis not present

## 2021-06-02 DIAGNOSIS — I70203 Unspecified atherosclerosis of native arteries of extremities, bilateral legs: Secondary | ICD-10-CM | POA: Diagnosis not present

## 2021-06-02 DIAGNOSIS — L03032 Cellulitis of left toe: Secondary | ICD-10-CM | POA: Diagnosis not present

## 2021-06-02 DIAGNOSIS — L02612 Cutaneous abscess of left foot: Secondary | ICD-10-CM | POA: Diagnosis not present

## 2021-06-03 DIAGNOSIS — M1712 Unilateral primary osteoarthritis, left knee: Secondary | ICD-10-CM | POA: Diagnosis not present

## 2021-06-03 DIAGNOSIS — M1711 Unilateral primary osteoarthritis, right knee: Secondary | ICD-10-CM | POA: Diagnosis not present

## 2021-06-05 ENCOUNTER — Ambulatory Visit (INDEPENDENT_AMBULATORY_CARE_PROVIDER_SITE_OTHER): Payer: Medicare Other

## 2021-06-05 ENCOUNTER — Other Ambulatory Visit: Payer: Self-pay

## 2021-06-05 DIAGNOSIS — Z5181 Encounter for therapeutic drug level monitoring: Secondary | ICD-10-CM

## 2021-06-05 DIAGNOSIS — I480 Paroxysmal atrial fibrillation: Secondary | ICD-10-CM | POA: Diagnosis not present

## 2021-06-05 LAB — POCT INR: INR: 2.5 (ref 2.0–3.0)

## 2021-06-05 NOTE — Patient Instructions (Signed)
Description   Continue taking Warfarin 1/2 tablet daily except 1 tablet each Mondays and Fridays. Recheck INR in 6 weeks. Call with any new medications, any new procedures, and with any concerns  [phone]

## 2021-06-09 DIAGNOSIS — M47812 Spondylosis without myelopathy or radiculopathy, cervical region: Secondary | ICD-10-CM | POA: Diagnosis not present

## 2021-06-11 ENCOUNTER — Other Ambulatory Visit: Payer: Self-pay | Admitting: Interventional Cardiology

## 2021-06-12 DIAGNOSIS — M47812 Spondylosis without myelopathy or radiculopathy, cervical region: Secondary | ICD-10-CM | POA: Diagnosis not present

## 2021-06-16 DIAGNOSIS — L600 Ingrowing nail: Secondary | ICD-10-CM | POA: Diagnosis not present

## 2021-06-16 DIAGNOSIS — L03032 Cellulitis of left toe: Secondary | ICD-10-CM | POA: Diagnosis not present

## 2021-06-17 DIAGNOSIS — M47812 Spondylosis without myelopathy or radiculopathy, cervical region: Secondary | ICD-10-CM | POA: Diagnosis not present

## 2021-06-19 DIAGNOSIS — M47812 Spondylosis without myelopathy or radiculopathy, cervical region: Secondary | ICD-10-CM | POA: Diagnosis not present

## 2021-06-23 DIAGNOSIS — M47812 Spondylosis without myelopathy or radiculopathy, cervical region: Secondary | ICD-10-CM | POA: Diagnosis not present

## 2021-07-06 DIAGNOSIS — L03032 Cellulitis of left toe: Secondary | ICD-10-CM | POA: Diagnosis not present

## 2021-07-16 ENCOUNTER — Ambulatory Visit (INDEPENDENT_AMBULATORY_CARE_PROVIDER_SITE_OTHER): Payer: Medicare Other

## 2021-07-16 ENCOUNTER — Other Ambulatory Visit: Payer: Self-pay

## 2021-07-16 DIAGNOSIS — Z5181 Encounter for therapeutic drug level monitoring: Secondary | ICD-10-CM

## 2021-07-16 DIAGNOSIS — I480 Paroxysmal atrial fibrillation: Secondary | ICD-10-CM | POA: Diagnosis not present

## 2021-07-16 LAB — POCT INR: INR: 2.1 (ref 2.0–3.0)

## 2021-07-16 NOTE — Patient Instructions (Signed)
Description   Continue taking Warfarin 1/2 tablet daily except 1 tablet each Mondays and Fridays. Recheck INR in 6 weeks. Call with any new medications, any new procedures, and with any concerns  [phone]

## 2021-07-17 DIAGNOSIS — Z0000 Encounter for general adult medical examination without abnormal findings: Secondary | ICD-10-CM | POA: Diagnosis not present

## 2021-07-17 DIAGNOSIS — F419 Anxiety disorder, unspecified: Secondary | ICD-10-CM | POA: Diagnosis not present

## 2021-07-17 DIAGNOSIS — I480 Paroxysmal atrial fibrillation: Secondary | ICD-10-CM | POA: Diagnosis not present

## 2021-07-17 DIAGNOSIS — N1831 Chronic kidney disease, stage 3a: Secondary | ICD-10-CM | POA: Diagnosis not present

## 2021-07-17 DIAGNOSIS — I10 Essential (primary) hypertension: Secondary | ICD-10-CM | POA: Diagnosis not present

## 2021-07-17 DIAGNOSIS — E559 Vitamin D deficiency, unspecified: Secondary | ICD-10-CM | POA: Diagnosis not present

## 2021-07-17 DIAGNOSIS — I5032 Chronic diastolic (congestive) heart failure: Secondary | ICD-10-CM | POA: Diagnosis not present

## 2021-07-17 DIAGNOSIS — Z1389 Encounter for screening for other disorder: Secondary | ICD-10-CM | POA: Diagnosis not present

## 2021-07-17 DIAGNOSIS — K219 Gastro-esophageal reflux disease without esophagitis: Secondary | ICD-10-CM | POA: Diagnosis not present

## 2021-07-17 DIAGNOSIS — M159 Polyosteoarthritis, unspecified: Secondary | ICD-10-CM | POA: Diagnosis not present

## 2021-07-20 DIAGNOSIS — I10 Essential (primary) hypertension: Secondary | ICD-10-CM | POA: Diagnosis not present

## 2021-07-22 DIAGNOSIS — Z1231 Encounter for screening mammogram for malignant neoplasm of breast: Secondary | ICD-10-CM | POA: Diagnosis not present

## 2021-07-23 DIAGNOSIS — I10 Essential (primary) hypertension: Secondary | ICD-10-CM | POA: Diagnosis not present

## 2021-07-23 DIAGNOSIS — K219 Gastro-esophageal reflux disease without esophagitis: Secondary | ICD-10-CM | POA: Diagnosis not present

## 2021-08-10 DIAGNOSIS — M25561 Pain in right knee: Secondary | ICD-10-CM | POA: Diagnosis not present

## 2021-08-10 DIAGNOSIS — M1711 Unilateral primary osteoarthritis, right knee: Secondary | ICD-10-CM | POA: Diagnosis not present

## 2021-08-12 ENCOUNTER — Telehealth: Payer: Self-pay | Admitting: *Deleted

## 2021-08-12 NOTE — Telephone Encounter (Signed)
° °  Pre-operative Risk Assessment    Patient Name: Sierra [name]  DOB: Dec 07, 1945 MRN: [id number]      Request for Surgical Clearance    Procedure:   RIGHT KNEE REPLACEMENT   Date of Surgery:  Clearance TBD                                 Surgeon:  DR. Melrose Nakayama Surgeon's Group or Practice Name:  [facility] Phone number:  [phone] Fax number:  [phone]   Type of Clearance Requested:   - Medical  - Pharmacy:  Hold Warfarin (Coumadin) x 5 DAYS PRIOR TO SURGERY ; [place]?   Type of Anesthesia:  Spinal   Additional requests/questions:    Jiles Prows   08/12/2021, 1:43 PM

## 2021-08-12 NOTE — Telephone Encounter (Signed)
Patient with diagnosis of afib on warfarin for anticoagulation.    Procedure:   RIGHT KNEE REPLACEMENT Date of procedure: TBD  CHA2DS2-VASc Score = 5   This indicates a 7.2% annual risk of stroke. The patient's score is based upon: CHF History: 1 HTN History: 1 Diabetes History: 0 Stroke History: 0 Vascular Disease History: 0 Age Score: 2 Gender Score: 1      Per office protocol, patient can hold warfarin for 5 days prior to procedure.    Patient will NOT need bridging with Lovenox (enoxaparin) around procedure.

## 2021-08-12 NOTE — Telephone Encounter (Signed)
Will route to pharm for input on anticoag then pt will need call. Last Ov 03/2021.

## 2021-08-14 NOTE — Telephone Encounter (Signed)
Left message to call back at 9:55 AM on 08/14/2021.  Patient is call back.

## 2021-08-17 ENCOUNTER — Telehealth: Payer: Self-pay | Admitting: Interventional Cardiology

## 2021-08-17 ENCOUNTER — Other Ambulatory Visit: Payer: Self-pay | Admitting: Orthopaedic Surgery

## 2021-08-17 NOTE — Telephone Encounter (Signed)
° °  Pre-operative Risk Assessment    Patient Name: Sierra [name]  DOB: Mar 16, 1946 MRN: [id number]      Request for Surgical Clearance    Procedure:  Right Knee Replacement  Date of Surgery:  09-08-21                                   Surgeon:  Dr Rhona Raider Surgeon's Group or Practice Name:   Phone number:  [phone] Fax number:  [phone]   Type of Clearance Requested:   - Medical  and Medicine   Type of Anesthesia:  Spinal   Additional requests/questions:    Signed, Glyn Ade   08/17/2021, 10:47 AM

## 2021-08-17 NOTE — Telephone Encounter (Signed)
° °  Primary Cardiologist: Larae Grooms, MD  Chart reviewed as part of pre-operative protocol coverage. Given past medical history and time since last visit, based on ACC/AHA guidelines, EMYLIA LATELLA would be at acceptable risk for the planned procedure without further cardiovascular testing.   She has no new cardiac issues since she was last seen in Oct 2022. She meets the 4 METs minimum requirement for her [facility] activity status.   Pharmacy weighed in on her coumadin and she is permitted to stop her coumadin 5 days prior to her procedure and no bridging with Lovenox is indicated. Resume coumadin as soon as it is deemed safe to do so by her surgeon.   Patient was advised that if she develops new symptoms prior to surgery to contact our office to arrange a follow-up appointment.  She verbalized understanding.  I will route this recommendation to the requesting party via Epic fax function and remove from pre-op pool.  Please call with questions. Elgie Collard, PA-C  08/17/2021, 1:08 PM

## 2021-08-25 DIAGNOSIS — H524 Presbyopia: Secondary | ICD-10-CM | POA: Diagnosis not present

## 2021-08-25 DIAGNOSIS — H04123 Dry eye syndrome of bilateral lacrimal glands: Secondary | ICD-10-CM | POA: Diagnosis not present

## 2021-08-25 DIAGNOSIS — H43813 Vitreous degeneration, bilateral: Secondary | ICD-10-CM | POA: Diagnosis not present

## 2021-08-25 DIAGNOSIS — H353132 Nonexudative age-related macular degeneration, bilateral, intermediate dry stage: Secondary | ICD-10-CM | POA: Diagnosis not present

## 2021-08-25 NOTE — Patient Instructions (Addendum)
DUE TO COVID-19 ONLY ONE VISITOR  (aged 78 and older)  IS ALLOWED TO COME WITH YOU AND STAY IN THE WAITING ROOM ONLY DURING PRE OP AND PROCEDURE.   **NO VISITORS ARE ALLOWED IN THE SHORT STAY AREA OR RECOVERY ROOM!!**  IF YOU WILL BE ADMITTED INTO THE HOSPITAL YOU ARE ALLOWED ONLY TWO SUPPORT PEOPLE DURING VISITATION HOURS ONLY (7 AM -8PM)   The support person(s) must pass our screening, gel in and out, and wear a mask at all times, including in the patients room. Patients must also wear a mask when staff or their support person are in the room. Visitors GUEST BADGE MUST BE WORN VISIBLY  One adult visitor may remain with you overnight and MUST be in the room by 8 P.M.     COVID SWAB TESTING MUST BE COMPLETED ON:  09/04/21 @ 1:30 PM    (*ARRIVE AT YOUR APPOINTMENT TIME STAFF IS NOT HERE BEFORE 8AM!!!*)    Site: [facility] [address] [place] Enter: Main Entrance have a seat in the waiting area to the right of main entrance (DO NOT [place]!!!!!) Dial: [phone] to alert staff you have arrived  You are not required to quarantine, however you are required to wear a well-fitted mask when you are out and around people not in your household.  Hand Hygiene often Do NOT share personal items Notify your provider if you are in close contact with someone who has COVID or you develop fever 100.4 or greater, new onset of sneezing, cough, sore throat, shortness of breath or body aches.       Your procedure is scheduled on: 09/08/21   Report to [facility] Main Entrance    Report to admitting at 9:45 AM   Call this number if you have problems the morning of surgery [phone]   Do not eat food :After Midnight.   After Midnight you may have the following liquids until 9:30 AM DAY OF SURGERY  Water Black Coffee (sugar ok, NO MILK/CREAM OR CREAMERS)  Tea (sugar ok, NO MILK/CREAM OR CREAMERS) regular and decaf                             Plain  Jell-O (NO RED)                                           Fruit ices (not with fruit pulp, NO RED)                                     Popsicles (NO RED)                                                                  Juice: apple, WHITE grape, WHITE cranberry Sports drinks like Gatorade (NO RED) Clear broth(vegetable,chicken,beef)     The day of surgery:  Drink ONE (1) Pre-Surgery Clear Ensure at 9:30 AM the morning of surgery. Drink in one sitting. Do not sip.  This drink was given  to you during your hospital  pre-op appointment visit. Nothing else to drink after completing the  Pre-Surgery Clear Ensure.          If you have questions, please contact your surgeons office.   FOLLOW BOWEL PREP AND ANY ADDITIONAL PRE OP INSTRUCTIONS YOU RECEIVED FROM YOUR SURGEON'S OFFICE!!!     Oral Hygiene is also important to reduce your risk of infection.                                    Remember - BRUSH YOUR TEETH THE MORNING OF SURGERY WITH YOUR REGULAR TOOTHPASTE   Take these medicines the morning of surgery with A SIP OF WATER: Tylenol, Amlodipine, Citalopram, Allegra  These are anesthesia recommendations for holding your anticoagulants.  Please contact your prescribing physician to confirm IF it is safe to hold your anticoagulants for this length of time.   Eliquis Apixaban   3 DAYS  Xarelto Rivaroxaban   3 DAYS  Plavix Clopidogrel   5 DAYS  Pletal Cilostazol   5 DAYS                                You may not have any metal on your body including hair pins, jewelry, and body piercing             Do not wear make-up, lotions, powders, perfumes, or deodorant  Do not wear nail polish including gel and S&S, artificial/acrylic nails, or any other type of covering on natural nails including finger and toenails. If you have artificial nails, gel coating, etc. that needs to be removed by a nail salon please have this removed prior to surgery or surgery may need to be canceled/ delayed if  the surgeon/ anesthesia feels like they are unable to be safely monitored.   Do not shave  48 hours prior to surgery.    Do not bring valuables to the hospital. [place].   Bring small overnight bag day of surgery.   Special Instructions: Bring a copy of your healthcare power of attorney and living will documents         the day of surgery if you haven't scanned them before.              Please read over the following fact sheets you were given: IF YOU HAVE QUESTIONS ABOUT YOUR PRE-OP INSTRUCTIONS PLEASE CALL [facility] - Preparing for Surgery Before surgery, you can play an important role.  Because skin is not sterile, your skin needs to be as free of germs as possible.  You can reduce the number of germs on your skin by washing with CHG (chlorahexidine gluconate) soap before surgery.  CHG is an antiseptic cleaner which kills germs and bonds with the skin to continue killing germs even after washing. Please DO NOT use if you have an allergy to CHG or antibacterial soaps.  If your skin becomes reddened/irritated stop using the CHG and inform your nurse when you arrive at Short Stay. Do not shave (including legs and underarms) for at least 48 hours prior to the first CHG shower.  You may shave your face/neck.  Please follow these instructions carefully:  1.  Shower with  CHG Soap the night before surgery and the  morning of surgery.  2.  If you choose to wash your hair, wash your hair first as usual with your normal  shampoo.  3.  After you shampoo, rinse your hair and body thoroughly to remove the shampoo.                             4.  Use CHG as you would any other liquid soap.  You can apply chg directly to the skin and wash.  Gently with a scrungie or clean washcloth.  5.  Apply the CHG Soap to your body ONLY FROM THE NECK DOWN.   Do   not use on face/ open                           Wound or open sores. Avoid  contact with eyes, ears mouth and   genitals (private parts).                       Wash face,  Genitals (private parts) with your normal soap.             6.  Wash thoroughly, paying special attention to the area where your    surgery  will be performed.  7.  Thoroughly rinse your body with warm water from the neck down.  8.  DO NOT shower/wash with your normal soap after using and rinsing off the CHG Soap.                9.  Pat yourself dry with a clean towel.            10.  Wear clean pajamas.            11.  Place clean sheets on your bed the night of your first shower and do not  sleep with pets. Day of Surgery : Do not apply any lotions/deodorants the morning of surgery.  Please wear clean clothes to the hospital/surgery center.  FAILURE TO FOLLOW THESE INSTRUCTIONS MAY RESULT IN THE CANCELLATION OF YOUR SURGERY  PATIENT SIGNATURE_________________________________  NURSE SIGNATURE__________________________________  ________________________________________________________________________   [name]  An incentive spirometer is a tool that can help keep your lungs clear and active. This tool measures how well you are filling your lungs with each breath. Taking long deep breaths may help reverse or decrease the chance of developing breathing (pulmonary) problems (especially infection) following: A long period of time when you are unable to move or be active. BEFORE THE PROCEDURE  If the spirometer includes an indicator to show your best effort, your nurse or respiratory therapist will set it to a desired goal. If possible, sit up straight or lean slightly forward. Try not to slouch. Hold the incentive spirometer in an upright position. INSTRUCTIONS FOR USE  Sit on the edge of your bed if possible, or sit up as far as you can in bed or on a chair. Hold the incentive spirometer in an upright position. Breathe out normally. Place the mouthpiece in your mouth and seal your  lips tightly around it. Breathe in slowly and as deeply as possible, raising the piston or the ball toward the top of the column. Hold your breath for 3-5 seconds or for as long as possible. Allow the piston or ball to fall to the bottom of the column. Remove the  mouthpiece from your mouth and breathe out normally. Rest for a few seconds and repeat Steps 1 through 7 at least 10 times every 1-2 hours when you are awake. Take your time and take a few normal breaths between deep breaths. The spirometer may include an indicator to show your best effort. Use the indicator as a goal to work toward during each repetition. After each set of 10 deep breaths, practice coughing to be sure your lungs are clear. If you have an incision (the cut made at the time of surgery), support your incision when coughing by placing a pillow or rolled up towels firmly against it. Once you are able to get out of bed, walk around indoors and cough well. You may stop using the incentive spirometer when instructed by your caregiver.  RISKS AND COMPLICATIONS Take your time so you do not get dizzy or light-headed. If you are in pain, you may need to take or ask for pain medication before doing incentive spirometry. It is harder to take a deep breath if you are having pain. AFTER USE Rest and breathe slowly and easily. It can be helpful to keep track of a log of your progress. Your caregiver can provide you with a simple table to help with this. If you are using the spirometer at home, follow these instructions: [place] IF:  You are having difficultly using the spirometer. You have trouble using the spirometer as often as instructed. Your pain medication is not giving enough relief while using the spirometer. You develop fever of 100.5 F (38.1 C) or higher. SEEK IMMEDIATE MEDICAL CARE IF:  You cough up bloody sputum that had not been present before. You develop fever of 102 F (38.9 C) or greater. You develop  worsening pain at or near the incision site. MAKE SURE YOU:  Understand these instructions. Will watch your condition. Will get help right away if you are not doing well or get worse. Document Released: 10/18/2006 Document Revised: 08/30/2011 Document Reviewed: 12/19/2006 Exit[employer] Patient Information 2014 [employer], [place].   ________________________________________________________________________  WHAT IS A BLOOD TRANSFUSION? Blood Transfusion Information  A transfusion is the replacement of blood or some of its parts. Blood is made up of multiple cells which provide different functions. Red blood cells carry oxygen and are used for blood loss replacement. White blood cells fight against infection. Platelets control bleeding. Plasma helps clot blood. Other blood products are available for specialized needs, such as hemophilia or other clotting disorders. BEFORE THE TRANSFUSION  Who gives blood for transfusions?  Healthy volunteers who are fully evaluated to make sure their blood is safe. This is blood bank blood. Transfusion therapy is the safest it has ever been in the practice of medicine. Before blood is taken from a donor, a complete history is taken to make sure that person has no history of diseases nor engages in risky social behavior (examples are intravenous drug use or sexual activity with multiple partners). The donor's travel history is screened to minimize risk of transmitting infections, such as malaria. The donated blood is tested for signs of infectious diseases, such as HIV and hepatitis. The blood is then tested to be sure it is compatible with you in order to minimize the chance of a transfusion reaction. If you or a relative donates blood, this is often done in anticipation of surgery and is not appropriate for emergency situations. It takes many days to process the donated blood. RISKS AND COMPLICATIONS Although transfusion therapy is very  safe and saves many lives, the  main dangers of transfusion include:  Getting an infectious disease. Developing a transfusion reaction. This is an allergic reaction to something in the blood you were given. Every precaution is taken to prevent this. The decision to have a blood transfusion has been considered carefully by your caregiver before blood is given. Blood is not given unless the benefits outweigh the risks. AFTER THE TRANSFUSION Right after receiving a blood transfusion, you will usually feel much better and more energetic. This is especially true if your red blood cells have gotten low (anemic). The transfusion raises the level of the red blood cells which carry oxygen, and this usually causes an energy increase. The nurse administering the transfusion will monitor you carefully for complications. HOME CARE INSTRUCTIONS  No special instructions are needed after a transfusion. You may find your energy is better. Speak with your caregiver about any limitations on activity for underlying diseases you may have. SEEK MEDICAL CARE IF:  Your condition is not improving after your transfusion. You develop redness or irritation at the intravenous (IV) site. SEEK IMMEDIATE MEDICAL CARE IF:  Any of the following symptoms occur over the next 12 hours: Shaking chills. You have a temperature by mouth above 102 F (38.9 C), not controlled by medicine. Chest, back, or muscle pain. People around you feel you are not acting correctly or are confused. Shortness of breath or difficulty breathing. Dizziness and fainting. You get a rash or develop hives. You have a decrease in urine output. Your urine turns a dark color or changes to pink, red, or brown. Any of the following symptoms occur over the next 10 days: You have a temperature by mouth above 102 F (38.9 C), not controlled by medicine. Shortness of breath. Weakness after normal activity. The white part of the eye turns yellow (jaundice). You have a decrease in the amount  of urine or are urinating less often. Your urine turns a dark color or changes to pink, red, or brown. Document Released: 06/04/2000 Document Revised: 08/30/2011 Document Reviewed: 01/22/2008 [facility] Patient Information 2014 [place], [place].  _______________________________________________________________________

## 2021-08-25 NOTE — Progress Notes (Signed)
COVID swab appointment: 09/04/21 @ ? ?COVID Vaccine Completed: yes x2 ?Date COVID Vaccine completed: 07/10/19, 07/31/19 ?Has received booster: ?COVID vaccine manufacturer: Pfizer     ? ?Date of COVID positive in last 90 days: ? ?PCP - Lavone Orn, MD ?Cardiologist - Larae Grooms, MD ? ?Cardiac clearance 08/17/21 by Nicholes Rough in [place] ? ?Chest x-ray -  ?EKG - 03/25/21 Epic ?Stress Test -  ?ECHO - 04/09/17 Epic ?Cardiac Cath -  ?Pacemaker/ICD device last checked: ?Spinal Cord Stimulator: ? ?Bowel Prep -  ? ?Sleep Study -  ?CPAP -  ? ?Fasting Blood Sugar -  ?Checks Blood Sugar _____ times a day ? ?Blood Thinner Instructions: Warfarin, hold 5 days no Lovenox bridging ?Aspirin Instructions: ?Last Dose: ? ?Activity level:  Can go up a flight of stairs and perform activities of daily living without stopping and without symptoms of chest pain or shortness of breath. ?  Able to exercise without symptoms ? ?Unable to go up a flight of stairs without symptoms of  ?   ? ?Anesthesia review: atrial fib, HTN, HF, CKD ? ?Patient denies shortness of breath, fever, cough and chest pain at PAT appointment ? ? ?Patient verbalized understanding of instructions that were given to them at the PAT appointment. Patient was also instructed that they will need to review over the PAT instructions again at home before surgery.  ?

## 2021-08-25 NOTE — Telephone Encounter (Signed)
Patient is returning NP's call.  ?

## 2021-08-25 NOTE — Telephone Encounter (Signed)
Patient was returning a call from Coletta Memos from 08/14/21, however her preoperative clearance was completed by Nicholes Rough, PA on 08/17/21. Patient reported no further questions or concerns. She has an appointment with [facility] on 3/8 and is aware to keep that appointment.  ?

## 2021-08-26 ENCOUNTER — Ambulatory Visit (INDEPENDENT_AMBULATORY_CARE_PROVIDER_SITE_OTHER): Payer: Medicare Other

## 2021-08-26 ENCOUNTER — Other Ambulatory Visit: Payer: Self-pay

## 2021-08-26 ENCOUNTER — Encounter (HOSPITAL_COMMUNITY): Payer: Medicare Other

## 2021-08-26 DIAGNOSIS — Z5181 Encounter for therapeutic drug level monitoring: Secondary | ICD-10-CM | POA: Diagnosis not present

## 2021-08-26 DIAGNOSIS — I480 Paroxysmal atrial fibrillation: Secondary | ICD-10-CM | POA: Diagnosis not present

## 2021-08-26 LAB — POCT INR: INR: 2.8 (ref 2.0–3.0)

## 2021-08-26 NOTE — Patient Instructions (Addendum)
Description   ?Continue taking Warfarin 1/2 tablet daily except 1 tablet each Mondays and Fridays until 09/03/21. Hold Warfarin for 5 days prior to procedure (3/16 - 3/20).  ?On 3/21 and 3/22 take an extra 0.5 tablet (or as instructed by surgeon). On 3/23 resume normal dose (Warfarin 1/2 tablet daily except 1 tablet each Mondays and Fridays ?Recheck INR in 1 week post procedure. ? Call with any new medications, any new procedures, and with any concerns  [phone]  ?  ?   ?

## 2021-08-27 ENCOUNTER — Encounter (HOSPITAL_COMMUNITY): Payer: Self-pay

## 2021-08-27 ENCOUNTER — Ambulatory Visit (HOSPITAL_COMMUNITY)
Admission: RE | Admit: 2021-08-27 | Discharge: 2021-08-27 | Disposition: A | Discharge status: Home / Self Care | Payer: Medicare Other | Source: Ambulatory Visit | Attending: Orthopaedic Surgery | Admitting: Orthopaedic Surgery

## 2021-08-27 ENCOUNTER — Encounter (HOSPITAL_COMMUNITY)
Admission: RE | Admit: 2021-08-27 | Discharge: 2021-08-27 | Disposition: A | Discharge status: Home / Self Care | Payer: Medicare Other | Source: Ambulatory Visit | Attending: Orthopaedic Surgery | Admitting: Orthopaedic Surgery

## 2021-08-27 VITALS — BP 145/72 | HR 83 | Temp 98.5°F | Resp 14 | Ht 66.0 in | Wt 176.3 lb

## 2021-08-27 DIAGNOSIS — Z01818 Encounter for other preprocedural examination: Secondary | ICD-10-CM | POA: Insufficient documentation

## 2021-08-27 DIAGNOSIS — I10 Essential (primary) hypertension: Secondary | ICD-10-CM | POA: Diagnosis not present

## 2021-08-27 DIAGNOSIS — I480 Paroxysmal atrial fibrillation: Secondary | ICD-10-CM | POA: Diagnosis not present

## 2021-08-27 DIAGNOSIS — I517 Cardiomegaly: Secondary | ICD-10-CM | POA: Diagnosis not present

## 2021-08-27 HISTORY — DX: Heart failure, unspecified: I50.9

## 2021-08-27 HISTORY — DX: Cardiac arrhythmia, unspecified: I49.9

## 2021-08-27 LAB — BASIC METABOLIC PANEL
Anion gap: 7 (ref 5–15)
BUN: 41 mg/dL — ABNORMAL HIGH (ref 8–23)
CO2: 27 mmol/L (ref 22–32)
Calcium: 9.4 mg/dL (ref 8.9–10.3)
Chloride: 106 mmol/L (ref 98–111)
Creatinine, Ser: 1.05 mg/dL — ABNORMAL HIGH (ref 0.44–1.00)
GFR, Estimated: 55 mL/min — ABNORMAL LOW (ref >60)
Glucose, Bld: 110 mg/dL — ABNORMAL HIGH (ref 70–99)
Potassium: 5 mmol/L (ref 3.5–5.1)
Sodium: 140 mmol/L (ref 135–145)

## 2021-08-27 LAB — CBC
HCT: 36.1 % (ref 36.0–46.0)
Hemoglobin: 12.2 g/dL (ref 12.0–15.0)
MCH: 32.1 pg (ref 26.0–34.0)
MCHC: 33.8 g/dL (ref 30.0–36.0)
MCV: 95 fL (ref 80.0–100.0)
Platelets: 189 10*3/uL (ref 150–400)
RBC: 3.8 MIL/uL — ABNORMAL LOW (ref 3.87–5.11)
RDW: 13.4 % (ref 11.5–15.5)
WBC: 4.3 10*3/uL (ref 4.0–10.5)
nRBC: 0 % (ref 0.0–0.2)

## 2021-08-27 LAB — SURGICAL PCR SCREEN
MRSA, PCR: NEGATIVE
Staphylococcus aureus: NEGATIVE

## 2021-08-27 LAB — TYPE AND SCREEN
ABO/RH(D): A POS
Antibody Screen: NEGATIVE

## 2021-08-27 NOTE — Progress Notes (Incomplete)
COVID swab appointment: 09/04/21 @ 1330  COVID Vaccine Completed: yes x2 Date COVID Vaccine completed: 07/10/19, 07/31/19 Has received booster: COVID vaccine manufacturer: [employer]      Date of COVID positive in last 90 days: no  PCP - Lavone Orn, MD Cardiologist - Larae Grooms, MD  Cardiac clearance 08/17/21 by Nicholes Rough in [place]  Chest x-ray - 08/27/21 Epic EKG - 03/25/21 Epic Stress Test - n/a ECHO - 04/09/17 Epic Cardiac Cath - n/a Pacemaker/ICD device last checked: n/a Spinal Cord Stimulator: n/a  Bowel Prep - no  Sleep Study - n/a CPAP -   Fasting Blood Sugar - n/a Checks Blood Sugar _____ times a day  Blood Thinner Instructions: Warfarin, hold 5 days no Lovenox bridging Aspirin Instructions: Last Dose:  Activity level: Can go up a flight of stairs and perform activities of daily living without stopping and without symptoms of chest pain or shortness of breath.    Anesthesia review: atrial fib, HTN, HF, CKD  Patient denies shortness of breath, fever, cough and chest pain at PAT appointment   Patient verbalized understanding of instructions that were given to them at the PAT appointment. Patient was also instructed that they will need to review over the PAT instructions again at home before surgery.

## 2021-08-28 ENCOUNTER — Encounter (HOSPITAL_COMMUNITY): Payer: Self-pay | Admitting: Physician Assistant

## 2021-08-28 NOTE — Progress Notes (Signed)
Anesthesia Chart Review ? ? Case: [id number] Date/Time: 09/08/21 1215  ? Procedure: RIGHT TOTAL KNEE ARTHROPLASTY (Right: Knee)  ? Anesthesia type: Spinal  ? Pre-op diagnosis: RIGHT KNEE DEGENERATIVE JOINT DISEASE  ? Location: [facility] ROOM 06 / [facility]  ? Surgeons: Melrose Nakayama, MD  ? ?  ? ? ?DISCUSSION:75 y.o. never smoker with h/o HTN, GERD, atrial fibrillation (on Warfarin), CHF, right knee djd scheduled for above procedure with Dr. Melrose Nakayama.  ? ?Per cardiology preoperative evaluation 08/17/2021, "Chart reviewed as part of pre-operative protocol coverage. Given past medical history and time since last visit, based on ACC/AHA guidelines, Sierra [name] would be at acceptable risk for the planned procedure without further cardiovascular testing.  ?  ?She has no new cardiac issues since she was last seen in Oct 2022. She meets the 4 METs minimum requirement for her [facility] activity status.  ?  ?Pharmacy weighed in on her coumadin and she is permitted to stop her coumadin 5 days prior to her procedure and no bridging with Lovenox is indicated. Resume coumadin as soon as it is deemed safe to do so by her surgeon. " ? ?Warfarin instructions confirmed with pt by PAT nurse.  ? ?Anticipate pt can proceed with planned procedure barring acute status change.   ?VS: BP (!) 145/72   Pulse 83   Temp 36.9 ?C (Oral)   Resp 14   Ht '5\' 6"'$  (1.676 m)   Wt 80 kg   SpO2 98%   BMI 28.46 kg/m?  ? ?PROVIDERS: ?Lavone Orn, MD is PCP  ? ?Cardiologist - Larae Grooms, MD ?LABS: Labs reviewed: Acceptable for surgery. ?(all labs ordered are listed, but only abnormal results are displayed) ? ?Labs Reviewed  ?BASIC METABOLIC PANEL - Abnormal; Notable for the following components:  ?    Result Value  ? Glucose, Bld 110 (*)   ? BUN 41 (*)   ? Creatinine, Ser 1.05 (*)   ? GFR, Estimated 55 (*)   ? All other components within normal limits  ?CBC - Abnormal; Notable for the following components:  ? RBC 3.80 (*)   ? All other components  within normal limits  ?SURGICAL PCR SCREEN  ?TYPE AND SCREEN  ? ? ? ?IMAGES: ? ? ?EKG: ?03/25/2021 ?Rate 86 bpm  ?A-fib  ?Rate controlled ? ?CV: ?Echo 04/09/2017 ?- Left ventricle: The cavity size was normal. There was mild  ?  concentric hypertrophy. Systolic function was normal. The  ?  estimated ejection fraction was in the range of 55% to 60%. Wall  ?  motion was normal; there were no regional wall motion  ?  abnormalities.  ?- Aortic valve: Transvalvular velocity was within the normal range.  ?  There was no stenosis. There was no regurgitation.  ?- Mitral valve: Transvalvular velocity was within the normal range.  ?  There was no evidence for stenosis. There was mild regurgitation.  ?- Left atrium: The atrium was severely dilated.  ?- Right ventricle: The cavity size was normal. Wall thickness was  ?  normal. Systolic function was normal.  ?- Right atrium: The atrium was moderately dilated.  ?- Tricuspid valve: There was mild regurgitation.  ?- Pulmonary arteries: Systolic pressure was mildly increased. PA  ?  peak pressure: 48 mm Hg (S).  ?Past Medical History:  ?Diagnosis Date  ? Anxiety   ? Arthritis   ? "joints" (09/19/2013)  ? Atrial fibrillation ([place])   ? 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  CHF (congestive heart failure) ([place])   ? Chronic lower back pain   ? Dysrhythmia   ? GERD (gastroesophageal reflux disease)   ? H/O hiatal hernia   ? Hypertension   ? Migraine   ? "haven't had any in a long time" (09/19/2013)  ? Pneumonia ~ 2012  ? ? ?Past Surgical History:  ?Procedure Laterality Date  ? BASAL CELL CARCINOMA EXCISION Left   ? "cheek"  ? CARDIOVERSION N/A 07/15/2017  ? Procedure: CARDIOVERSION;  Surgeon: Thayer Headings, MD;  Location: [facility];  Service: Cardiovascular;  Laterality: N/A;  ? CHOLECYSTECTOMY  02/19/1989  ? COLONOSCOPY WITH PROPOFOL N/A 07/05/2016  ? Procedure: COLONOSCOPY WITH PROPOFOL;  Surgeon: Garlan Fair, MD;  [facility];  Service: Endoscopy;  Laterality: N/A;   ? DILATION AND CURETTAGE OF UTERUS    ? HERNIA REPAIR  16/03/9603  ? umbilical  ? JOINT REPLACEMENT    ? MOHS SURGERY    ? OOPHORECTOMY Bilateral   ? REVISION TOTAL HIP ARTHROPLASTY Left 09/19/2013  ? SHOULDER ARTHROSCOPY W/ ROTATOR CUFF REPAIR Right   ? TEMPOROMANDIBULAR JOINT SURGERY    ? TMJ ARTHROPLASTY    ? TOTAL HIP ARTHROPLASTY Left 06/22/2007  ? TOTAL HIP ARTHROPLASTY Right 06/21/2009  ? TOTAL HIP REVISION Left 09/19/2013  ? Procedure: LEFT TOTAL HIP REVISION;  Surgeon: Kerin Salen, MD;  Location: [facility];  Service: Orthopedics;  Laterality: Left;  ? TUBAL LIGATION    ? VAGINAL HYSTERECTOMY  02/19/1989  ? ? ?MEDICATIONS: ? acetaminophen (TYLENOL) 650 MG CR tablet  ? amLODipine (NORVASC) 5 MG tablet  ? Cholecalciferol (VITAMIN D-3) 5000 UNITS TABS  ? citalopram (CELEXA) 20 MG tablet  ? Coenzyme Q10 (CO Q-10) 100 MG CAPS  ? colestipol (COLESTID) 5 g packet  ? fexofenadine (ALLEGRA) 180 MG tablet  ? Glucosamine HCl 1000 MG TABS  ? Lidocaine-Menthol (ICY HOT MAX LIDOCAINE) 4-1 % CREA  ? MAGNESIUM CITRATE PO  ? Menthol, Topical Analgesic, (BIOFREEZE) 4 % GEL  ? metolazone (ZAROXOLYN) 5 MG tablet  ? metoprolol succinate (TOPROL-XL) 50 MG 24 hr tablet  ? Multiple Vitamins-Minerals (MEMORY VITE PO)  ? Multiple Vitamins-Minerals (MULTIVITAMIN WITH MINERALS) tablet  ? Multiple Vitamins-Minerals (PRESERVISION AREDS 2) CAPS  ? potassium chloride (K-DUR,KLOR-CON) 10 MEQ tablet  ? RABEprazole (ACIPHEX) 20 MG tablet  ? terazosin (HYTRIN) 10 MG capsule  ? torsemide (DEMADEX) 20 MG tablet  ? valsartan (DIOVAN) 160 MG tablet  ? warfarin (COUMADIN) 5 MG tablet  ? ?No current facility-administered medications for this encounter.  ? ?Konrad Felix Ward, PA-C ?[facility] ?[phone] ? ? ? ? ? ? ? ?

## 2021-08-28 NOTE — Anesthesia Preprocedure Evaluation (Deleted)
Anesthesia Evaluation  ? ? ?Airway ? ? ? ? ? ? ? Dental ?  ?Pulmonary ? ?  ? ? ? ? ? ? ? Cardiovascular ?hypertension,  ? ? ?  ?Neuro/Psych ?  ? GI/Hepatic ?  ?Endo/Other  ? ? Renal/GU ?  ? ?  ?Musculoskeletal ? ? Abdominal ?  ?Peds ? Hematology ?  ?Anesthesia Other Findings ? ? Reproductive/Obstetrics ? ?  ? ? ? ? ? ? ? ? ? ? ? ? ? ?  ?  ? ? ? ? ? ? ? ? ?Anesthesia Physical ?Anesthesia Plan ? ?ASA:  ? ?Anesthesia Plan:   ? ?Post-op Pain Management:   ? ?Induction:  ? ?PONV Risk Score and Plan:  ? ?Airway Management Planned:  ? ?Additional Equipment:  ? ?Intra-op Plan:  ? ?Post-operative Plan:  ? ?Informed Consent:  ? ?Plan Discussed with:  ? ?Anesthesia Plan Comments: (See PAT note 08/27/2021, Konrad Felix Ward, PA-C)  ? ? ? ? ? ? ?Anesthesia Quick Evaluation ? ?

## 2021-09-01 NOTE — Care Plan (Signed)
Ortho Bundle Case Management Note ? ?Patient Details  ?Name: Sierra [name] ?MRN: [id number] ?Date of Birth: [birth year] ? ?  Spoke with patient prior to surgery. Will discharge to  home with family to assist. Has walker at home. HHPT referral to [facility] and [place] set up with [facility]. Patient and MD in agreement with plan. Choice offerred               ? ? ? ?DME Arranged:    ?DME Agency:    ? ?HH Arranged:  PT ?[place] Agency:  [facility] ? ?Additional Comments: ?Please contact me with any questions of if this plan should need to change. ? ?Mardelle Matte  [facility] Orthopaedic Specialist  [phone] ?09/01/2021, 12:06 PM ?  ?

## 2021-09-03 ENCOUNTER — Telehealth: Payer: Self-pay | Admitting: *Deleted

## 2021-09-03 NOTE — Telephone Encounter (Signed)
Pt left a voicemail stating she will be not be having her scheduled surgery on 09/08/2021. Returned a call to the pt and she stated her husband is having medical issues and he could not handle the stress of having to take care of her. She stated she has called the surgeon to cancel as well. Also, advised pt to continue taking her Warfarin 2.'5mg'$  daily except '5mg'$  on Monday and Fridays. Also, rescheduled next appt, normally she comes in every 6 weeks since stable, so made appt 6 weeks from last INR check. She will call back if needed.  ?

## 2021-09-04 ENCOUNTER — Encounter (HOSPITAL_COMMUNITY): Payer: Medicare Other

## 2021-09-06 ENCOUNTER — Encounter (HOSPITAL_COMMUNITY): Payer: Self-pay

## 2021-09-06 ENCOUNTER — Other Ambulatory Visit: Payer: Self-pay

## 2021-09-06 ENCOUNTER — Inpatient Hospital Stay (HOSPITAL_COMMUNITY)
Admission: EM | Admit: 2021-09-06 | Discharge: 2021-09-10 | DRG: 596 | Disposition: A | Discharge status: Skilled Nursing Facility | Payer: Medicare Other | Attending: Internal Medicine | Admitting: Internal Medicine

## 2021-09-06 ENCOUNTER — Emergency Department (HOSPITAL_COMMUNITY): Payer: Medicare Other

## 2021-09-06 DIAGNOSIS — M25552 Pain in left hip: Secondary | ICD-10-CM

## 2021-09-06 DIAGNOSIS — M1990 Unspecified osteoarthritis, unspecified site: Secondary | ICD-10-CM | POA: Diagnosis not present

## 2021-09-06 DIAGNOSIS — R2689 Other abnormalities of gait and mobility: Secondary | ICD-10-CM | POA: Diagnosis not present

## 2021-09-06 DIAGNOSIS — I110 Hypertensive heart disease with heart failure: Secondary | ICD-10-CM | POA: Diagnosis present

## 2021-09-06 DIAGNOSIS — I5032 Chronic diastolic (congestive) heart failure: Secondary | ICD-10-CM | POA: Diagnosis not present

## 2021-09-06 DIAGNOSIS — B029 Zoster without complications: Secondary | ICD-10-CM | POA: Diagnosis not present

## 2021-09-06 DIAGNOSIS — S7002XA Contusion of left hip, initial encounter: Secondary | ICD-10-CM | POA: Diagnosis present

## 2021-09-06 DIAGNOSIS — Z743 Need for continuous supervision: Secondary | ICD-10-CM | POA: Diagnosis not present

## 2021-09-06 DIAGNOSIS — K429 Umbilical hernia without obstruction or gangrene: Secondary | ICD-10-CM | POA: Diagnosis not present

## 2021-09-06 DIAGNOSIS — G8929 Other chronic pain: Secondary | ICD-10-CM | POA: Diagnosis not present

## 2021-09-06 DIAGNOSIS — Z96643 Presence of artificial hip joint, bilateral: Secondary | ICD-10-CM | POA: Diagnosis present

## 2021-09-06 DIAGNOSIS — D696 Thrombocytopenia, unspecified: Secondary | ICD-10-CM | POA: Diagnosis present

## 2021-09-06 DIAGNOSIS — S79912A Unspecified injury of left hip, initial encounter: Secondary | ICD-10-CM | POA: Diagnosis not present

## 2021-09-06 DIAGNOSIS — I4821 Permanent atrial fibrillation: Secondary | ICD-10-CM | POA: Diagnosis present

## 2021-09-06 DIAGNOSIS — M47816 Spondylosis without myelopathy or radiculopathy, lumbar region: Secondary | ICD-10-CM | POA: Diagnosis not present

## 2021-09-06 DIAGNOSIS — K4090 Unilateral inguinal hernia, without obstruction or gangrene, not specified as recurrent: Secondary | ICD-10-CM | POA: Diagnosis not present

## 2021-09-06 DIAGNOSIS — M19012 Primary osteoarthritis, left shoulder: Secondary | ICD-10-CM | POA: Diagnosis not present

## 2021-09-06 DIAGNOSIS — Z7901 Long term (current) use of anticoagulants: Secondary | ICD-10-CM

## 2021-09-06 DIAGNOSIS — E785 Hyperlipidemia, unspecified: Secondary | ICD-10-CM | POA: Diagnosis present

## 2021-09-06 DIAGNOSIS — M549 Dorsalgia, unspecified: Secondary | ICD-10-CM | POA: Diagnosis present

## 2021-09-06 DIAGNOSIS — Z881 Allergy status to other antibiotic agents status: Secondary | ICD-10-CM

## 2021-09-06 DIAGNOSIS — Z85828 Personal history of other malignant neoplasm of skin: Secondary | ICD-10-CM

## 2021-09-06 DIAGNOSIS — Z8249 Family history of ischemic heart disease and other diseases of the circulatory system: Secondary | ICD-10-CM | POA: Diagnosis not present

## 2021-09-06 DIAGNOSIS — M25519 Pain in unspecified shoulder: Secondary | ICD-10-CM | POA: Diagnosis not present

## 2021-09-06 DIAGNOSIS — I959 Hypotension, unspecified: Secondary | ICD-10-CM | POA: Diagnosis present

## 2021-09-06 DIAGNOSIS — W19XXXA Unspecified fall, initial encounter: Principal | ICD-10-CM

## 2021-09-06 DIAGNOSIS — M25512 Pain in left shoulder: Secondary | ICD-10-CM | POA: Diagnosis present

## 2021-09-06 DIAGNOSIS — M1711 Unilateral primary osteoarthritis, right knee: Secondary | ICD-10-CM | POA: Diagnosis present

## 2021-09-06 DIAGNOSIS — R41841 Cognitive communication deficit: Secondary | ICD-10-CM | POA: Diagnosis not present

## 2021-09-06 DIAGNOSIS — I499 Cardiac arrhythmia, unspecified: Secondary | ICD-10-CM | POA: Diagnosis not present

## 2021-09-06 DIAGNOSIS — I4819 Other persistent atrial fibrillation: Secondary | ICD-10-CM

## 2021-09-06 DIAGNOSIS — Z9181 History of falling: Secondary | ICD-10-CM | POA: Diagnosis not present

## 2021-09-06 DIAGNOSIS — W101XXA Fall (on)(from) sidewalk curb, initial encounter: Secondary | ICD-10-CM | POA: Diagnosis present

## 2021-09-06 DIAGNOSIS — G43919 Migraine, unspecified, intractable, without status migrainosus: Secondary | ICD-10-CM | POA: Diagnosis not present

## 2021-09-06 DIAGNOSIS — S4992XA Unspecified injury of left shoulder and upper arm, initial encounter: Secondary | ICD-10-CM | POA: Diagnosis not present

## 2021-09-06 DIAGNOSIS — Z888 Allergy status to other drugs, medicaments and biological substances status: Secondary | ICD-10-CM | POA: Diagnosis not present

## 2021-09-06 DIAGNOSIS — D638 Anemia in other chronic diseases classified elsewhere: Secondary | ICD-10-CM | POA: Diagnosis present

## 2021-09-06 DIAGNOSIS — R791 Abnormal coagulation profile: Secondary | ICD-10-CM | POA: Diagnosis present

## 2021-09-06 DIAGNOSIS — I10 Essential (primary) hypertension: Secondary | ICD-10-CM | POA: Diagnosis present

## 2021-09-06 DIAGNOSIS — K219 Gastro-esophageal reflux disease without esophagitis: Secondary | ICD-10-CM | POA: Diagnosis present

## 2021-09-06 DIAGNOSIS — M6281 Muscle weakness (generalized): Secondary | ICD-10-CM | POA: Diagnosis not present

## 2021-09-06 DIAGNOSIS — E876 Hypokalemia: Secondary | ICD-10-CM | POA: Diagnosis present

## 2021-09-06 DIAGNOSIS — Z79899 Other long term (current) drug therapy: Secondary | ICD-10-CM

## 2021-09-06 DIAGNOSIS — R2681 Unsteadiness on feet: Secondary | ICD-10-CM | POA: Diagnosis not present

## 2021-09-06 DIAGNOSIS — S329XXA Fracture of unspecified parts of lumbosacral spine and pelvis, initial encounter for closed fracture: Secondary | ICD-10-CM | POA: Diagnosis not present

## 2021-09-06 DIAGNOSIS — M545 Low back pain, unspecified: Secondary | ICD-10-CM | POA: Diagnosis not present

## 2021-09-06 DIAGNOSIS — Z7401 Bed confinement status: Secondary | ICD-10-CM | POA: Diagnosis not present

## 2021-09-06 DIAGNOSIS — I4891 Unspecified atrial fibrillation: Secondary | ICD-10-CM | POA: Diagnosis present

## 2021-09-06 DIAGNOSIS — I480 Paroxysmal atrial fibrillation: Secondary | ICD-10-CM | POA: Diagnosis not present

## 2021-09-06 DIAGNOSIS — R52 Pain, unspecified: Secondary | ICD-10-CM | POA: Diagnosis not present

## 2021-09-06 DIAGNOSIS — Z91041 Radiographic dye allergy status: Secondary | ICD-10-CM | POA: Diagnosis not present

## 2021-09-06 DIAGNOSIS — Z20822 Contact with and (suspected) exposure to covid-19: Secondary | ICD-10-CM | POA: Diagnosis present

## 2021-09-06 DIAGNOSIS — R531 Weakness: Secondary | ICD-10-CM

## 2021-09-06 DIAGNOSIS — D649 Anemia, unspecified: Secondary | ICD-10-CM | POA: Diagnosis not present

## 2021-09-06 LAB — RESP PANEL BY RT-PCR (FLU A&B, COVID) ARPGX2
Influenza A by PCR: NEGATIVE
Influenza B by PCR: NEGATIVE
SARS Coronavirus 2 by RT PCR: NEGATIVE

## 2021-09-06 LAB — CBC WITH DIFFERENTIAL/PLATELET
Abs Immature Granulocytes: 0.02 10*3/uL (ref 0.00–0.07)
Basophils Absolute: 0 10*3/uL (ref 0.0–0.1)
Basophils Relative: 1 %
Eosinophils Absolute: 0.1 10*3/uL (ref 0.0–0.5)
Eosinophils Relative: 2 %
HCT: 34 % — ABNORMAL LOW (ref 36.0–46.0)
Hemoglobin: 11.2 g/dL — ABNORMAL LOW (ref 12.0–15.0)
Immature Granulocytes: 0 %
Lymphocytes Relative: 12 %
Lymphs Abs: 0.7 10*3/uL (ref 0.7–4.0)
MCH: 31 pg (ref 26.0–34.0)
MCHC: 32.9 g/dL (ref 30.0–36.0)
MCV: 94.2 fL (ref 80.0–100.0)
Monocytes Absolute: 0.4 10*3/uL (ref 0.1–1.0)
Monocytes Relative: 6 %
Neutro Abs: 4.5 10*3/uL (ref 1.7–7.7)
Neutrophils Relative %: 79 %
Platelets: 144 10*3/uL — ABNORMAL LOW (ref 150–400)
RBC: 3.61 MIL/uL — ABNORMAL LOW (ref 3.87–5.11)
RDW: 13.3 % (ref 11.5–15.5)
WBC: 5.7 10*3/uL (ref 4.0–10.5)
nRBC: 0 % (ref 0.0–0.2)

## 2021-09-06 LAB — BASIC METABOLIC PANEL
Anion gap: 8 (ref 5–15)
BUN: 29 mg/dL — ABNORMAL HIGH (ref 8–23)
CO2: 22 mmol/L (ref 22–32)
Calcium: 8.7 mg/dL — ABNORMAL LOW (ref 8.9–10.3)
Chloride: 108 mmol/L (ref 98–111)
Creatinine, Ser: 0.9 mg/dL (ref 0.44–1.00)
GFR, Estimated: >60 mL/min (ref >60)
Glucose, Bld: 131 mg/dL — ABNORMAL HIGH (ref 70–99)
Potassium: 3.3 mmol/L — ABNORMAL LOW (ref 3.5–5.1)
Sodium: 138 mmol/L (ref 135–145)

## 2021-09-06 LAB — PROTIME-INR
INR: 2.8 — ABNORMAL HIGH (ref 0.8–1.2)
Prothrombin Time: 29.4 seconds — ABNORMAL HIGH (ref 11.4–15.2)

## 2021-09-06 MED ORDER — VALACYCLOVIR HCL 500 MG PO TABS
1000.0000 mg | ORAL_TABLET | Freq: Three times a day (TID) | ORAL | Status: DC
  Administered 2021-09-06 – 2021-09-10 (×11): 1000 mg via ORAL
  Filled 2021-09-06 (×11): qty 2

## 2021-09-06 MED ORDER — OXYCODONE-ACETAMINOPHEN 5-325 MG PO TABS
1.0000 | ORAL_TABLET | Freq: Once | ORAL | Status: AC
  Administered 2021-09-06: 1 via ORAL
  Filled 2021-09-06: qty 1

## 2021-09-06 MED ORDER — METOPROLOL SUCCINATE ER 50 MG PO TB24
150.0000 mg | ORAL_TABLET | Freq: Every day | ORAL | Status: DC
  Administered 2021-09-06 – 2021-09-09 (×3): 150 mg via ORAL
  Filled 2021-09-06 (×2): qty 1
  Filled 2021-09-06: qty 3

## 2021-09-06 MED ORDER — TRAMADOL HCL 50 MG PO TABS
50.0000 mg | ORAL_TABLET | Freq: Four times a day (QID) | ORAL | Status: DC | PRN
  Administered 2021-09-06: 50 mg via ORAL
  Filled 2021-09-06: qty 1

## 2021-09-06 MED ORDER — WARFARIN - PHARMACIST DOSING INPATIENT: Freq: Every day | Status: DC

## 2021-09-06 MED ORDER — IRBESARTAN 150 MG PO TABS
150.0000 mg | ORAL_TABLET | Freq: Every day | ORAL | Status: DC
  Administered 2021-09-07 – 2021-09-08 (×2): 150 mg via ORAL
  Filled 2021-09-06 (×4): qty 1

## 2021-09-06 MED ORDER — MAGNESIUM OXIDE -MG SUPPLEMENT 400 (240 MG) MG PO TABS
200.0000 mg | ORAL_TABLET | ORAL | Status: DC
  Administered 2021-09-07 – 2021-09-09 (×2): 200 mg via ORAL
  Filled 2021-09-06 (×3): qty 1

## 2021-09-06 MED ORDER — POTASSIUM CHLORIDE ER 10 MEQ PO TBCR
10.0000 meq | EXTENDED_RELEASE_TABLET | Freq: Every day | ORAL | Status: DC
  Administered 2021-09-07 – 2021-09-10 (×4): 10 meq via ORAL
  Filled 2021-09-06 (×8): qty 1

## 2021-09-06 MED ORDER — PANTOPRAZOLE SODIUM 40 MG PO TBEC
40.0000 mg | DELAYED_RELEASE_TABLET | Freq: Every day | ORAL | Status: DC
  Administered 2021-09-07 – 2021-09-10 (×3): 40 mg via ORAL
  Filled 2021-09-06 (×3): qty 1

## 2021-09-06 MED ORDER — VALACYCLOVIR HCL 500 MG PO TABS
500.0000 mg | ORAL_TABLET | Freq: Once | ORAL | Status: AC
  Administered 2021-09-06: 500 mg via ORAL
  Filled 2021-09-06: qty 1

## 2021-09-06 MED ORDER — TORSEMIDE 20 MG PO TABS
20.0000 mg | ORAL_TABLET | Freq: Every day | ORAL | Status: DC
  Administered 2021-09-07 – 2021-09-09 (×3): 20 mg via ORAL
  Filled 2021-09-06 (×5): qty 1

## 2021-09-06 MED ORDER — MORPHINE SULFATE (PF) 4 MG/ML IV SOLN
4.0000 mg | Freq: Once | INTRAVENOUS | Status: AC
  Administered 2021-09-06: 4 mg via INTRAVENOUS
  Filled 2021-09-06 (×2): qty 1

## 2021-09-06 MED ORDER — MAGNESIUM 250 MG PO TABS: 250.0000 mg | ORAL_TABLET | ORAL | Status: DC

## 2021-09-06 MED ORDER — COLESTIPOL HCL 5 G PO PACK
5.0000 g | PACK | Freq: Two times a day (BID) | ORAL | Status: DC
  Administered 2021-09-07 – 2021-09-09 (×7): 5 g via ORAL
  Filled 2021-09-06 (×10): qty 1

## 2021-09-06 MED ORDER — TERAZOSIN HCL 5 MG PO CAPS
10.0000 mg | ORAL_CAPSULE | Freq: Every day | ORAL | Status: DC
  Administered 2021-09-06 – 2021-09-09 (×4): 10 mg via ORAL
  Filled 2021-09-06 (×4): qty 2

## 2021-09-06 MED ORDER — CITALOPRAM HYDROBROMIDE 20 MG PO TABS
20.0000 mg | ORAL_TABLET | Freq: Every day | ORAL | Status: DC
  Administered 2021-09-07 – 2021-09-10 (×4): 20 mg via ORAL
  Filled 2021-09-06 (×4): qty 1

## 2021-09-06 MED ORDER — AMLODIPINE BESYLATE 5 MG PO TABS
5.0000 mg | ORAL_TABLET | Freq: Every day | ORAL | Status: DC
  Administered 2021-09-07 – 2021-09-08 (×2): 5 mg via ORAL
  Filled 2021-09-06 (×3): qty 1

## 2021-09-06 NOTE — ED Notes (Signed)
Patient requesting pain medication.  No current PRN order.  MD made aware ?

## 2021-09-06 NOTE — Progress Notes (Signed)
ANTICOAGULATION CONSULT NOTE - Initial Consult ? ?Pharmacy Consult for warfarin ?Indication: atrial fibrillation ? ?Allergies  ?Allergen Reactions  ? Ace Inhibitors Cough  ? Codeine Nausea Only  ? Erythromycin Other (See Comments)  ?  Stomach pain ?  ? Iohexol Hives  ?   Code: HIVES, Desc: patient states develops hives w/ iv contrast/mms ?  ? Iodinated Contrast Media Other (See Comments)  ?  Hives  ? ? ?Patient Measurements: ?Height: '5\' 6"'$  (167.6 cm) ?Weight: 79.8 kg (176 lb) ?IBW/kg (Calculated) : 59.3 ? ?Vital Signs: ?Temp: 97.6 ?F (36.4 ?C) (03/19 1215) ?Temp Source: Oral (03/19 1215) ?BP: 127/62 (03/19 1830) ?Pulse Rate: 77 (03/19 1830) ? ?Labs: ?Recent Labs  ?  09/06/21 ?1800 09/06/21 ?1806  ?HGB 11.2*  --   ?HCT 34.0*  --   ?PLT 144*  --   ?LABPROT  --  29.4*  ?INR  --  2.8*  ?CREATININE 0.90  --   ? ? ?Estimated Creatinine Clearance: 55.8 mL/min (by C-G formula based on SCr of 0.9 mg/dL). ? ? ?Medical History: ?Past Medical History:  ?Diagnosis Date  ? Anxiety   ? Arthritis   ? "joints" (09/19/2013)  ? Atrial fibrillation ([place])   ? 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 CHF (congestive heart failure) ([place])   ? Chronic lower back pain   ? Dysrhythmia   ? GERD (gastroesophageal reflux disease)   ? H/O hiatal hernia   ? Hypertension   ? Migraine   ? "haven't had any in a long time" (09/19/2013)  ? Pneumonia ~ 2012  ? ? ?Medications:  ?(Not in a hospital admission)  ?Scheduled:  ? [START ON 09/07/2021] amLODipine  5 mg Oral Daily  ? [START ON 09/07/2021] citalopram  20 mg Oral Daily  ? colestipol  5 g Oral BID  ? [START ON 09/07/2021] irbesartan  150 mg Oral Daily  ? [START ON 09/07/2021] Magnesium  250 mg Oral Q M,W,F  ? metoprolol succinate  150 mg Oral QHS  ? [START ON 09/07/2021] pantoprazole  40 mg Oral Daily  ? [START ON 09/07/2021] potassium chloride  10 mEq Oral Daily  ? terazosin  10 mg Oral QHS  ? [START ON 09/07/2021] torsemide  20 mg Oral Daily  ? [START ON 09/07/2021] Warfarin - Pharmacist Dosing Inpatient    Does not apply M2500  ? ?PRN:  ? ?Assessment: ?33 yoF with PMH PAF on warfarin PTA, CHF, HTN, admitted 3/19 with shoulder & hip pain after fall. Patient was reportedly on her way to clinic to be seen for possible new shingles rash. No ortho consult or mention of ortho procedures at this point, and Pharmacy to dose warfarin. ? ?Baseline INR therapeutic ?Prior anticoagulation: warfarin 5 mg on Mon, Fri; 2.5 mg all other days; LD 3/19 (took dose in ED) ? ?Significant events: ? ?Today, 09/06/2021: ?CBC: Hgb and plt both slightly low ?INR therapeutic ?Major drug interactions: none noted ?No bleeding issues per nursing ?Diet ordered ? ?Goal of Therapy: ?INR 2-3 ? ?Plan: ?No warfarin tonight, already took (presumably 2.5 mg) dose in ED ?Daily INR ?CBC at least q72 hr while on warfarin ?Monitor for signs of bleeding or thrombosis ? ? ?Reuel Boom, PharmD, BCPS ?[phone] ?09/06/2021, 9:26 PM ? ? ? ?

## 2021-09-06 NOTE — ED Triage Notes (Addendum)
Per EMS- Patient was outside walking and tripped over a curb. Patient denies LOC. Patient does take coumadin. ?Patient c/o pain to the left shoulder and has decreased ROM. Patient also c/o left hip pain and no deformity noted. ? ?Patient states she has shingles on the right side /back since yesterday. Patient was headed to a walk-in clinic to see about the shingles. ?

## 2021-09-06 NOTE — ED Notes (Signed)
Patient updated on plan of care.  No current complaints.  Patient reports no current pain.  Pain only "with movement." ?

## 2021-09-06 NOTE — ED Provider Triage Note (Signed)
Emergency Medicine Provider Triage Evaluation Note ? ?Sierra [name] , a 76 y.o. female  was evaluated in triage.  Pt complains of fall. The patient was going to the primary care office and walks with a cane at baseline. She tripped over the curb. She landed on left shoulder and hip. Reports she did not hit her head, but she is on Warfarin for afib. She was not able to get up, but with EMS help she was able to get up but reports pain on standing. She reports she was going to the office to get a rash on her side evaluated as well and thinks that it is shingles. H/o Left hip replacement and a revision.  ? ?Review of Systems  ?Positive: Hip pain, shoulder pain, rash ?Negative: Headache, worsening neck pain ? ?Physical Exam  ?BP (!) 142/79 (BP Location: Right Arm)   Pulse 68   Temp 97.6 ?F (36.4 ?C) (Oral)   Resp 14   Ht '5\' 6"'$  (1.676 m)   Wt 79.8 kg   SpO2 99%   BMI 28.41 kg/m?  ?Gen:   Awake, no distress   ?Resp:  Normal effort  ?MSK:   Tender in left hip. No pain on palpation with the shoulder, just pain on movement. Palpable DP and radial pulses.  ?Other:  Red, raise rash to the right flank with few pustular papules ? ?Medical Decision Making  ?Medically screening exam initiated at 12:27 PM.  Appropriate orders placed.  SHAELIN LALLEY was informed that the remainder of the evaluation will be completed by another provider, this initial triage assessment does not replace that evaluation, and the importance of remaining in the ED until their evaluation is complete. ? ?Imaging ordered ?  ?Sherrell Puller, PA-C ?09/06/21 1233 ? ?

## 2021-09-06 NOTE — H&P (Signed)
History and Physical    Sierra [name] MWN:[id number] DOB: 12-Jul-1945 DOA: 09/06/2021  PCP: Kirby Funk, MD  Patient coming from: Home.  Chief Complaint: Fall.  HPI: Sierra [name] is a 76 y.o. female with history of A-fib, diastolic CHF, hypertension had a fall while patient was trying to visit her primary care physician since she developed rash on the right flank area over the last 24 hours concerning for shingles.  Patient states she tripped on the curb and fell onto her left side.  Denies hitting head or losing consciousness.  Since the fall patient has been having pain in the left shoulder and left hip area.  Patient was originally scheduled to have surgery of her right knee next week which was canceled due to issues at home.  ED Course: In the ER x-rays of the left shoulder and hip does not show any fracture but patient is having significant pain.  Unable to ambulate because patient uses walker at home and due to the severe shoulder pain and left hip pain patient will be unable to ambulate.  COVID test negative.  Review of Systems: As per HPI, rest all negative.   Past Medical History:  Diagnosis Date   Anxiety    Arthritis    "joints" (09/19/2013)   Atrial fibrillation (HCC)    Basal cell carcinoma of cheek    left   CHF (congestive heart failure) (HCC)    Chronic lower back pain    Dysrhythmia    GERD (gastroesophageal reflux disease)    H/O hiatal hernia    Hypertension    Migraine    "haven't had any in a long time" (09/19/2013)   Pneumonia ~ 2012    Past Surgical History:  Procedure Laterality Date   BASAL CELL CARCINOMA EXCISION Left    "cheek"   CARDIOVERSION N/A 07/15/2017   Procedure: CARDIOVERSION;  Surgeon: Vesta Mixer, MD;  Location: [facility] ENDOSCOPY;  Service: Cardiovascular;  Laterality: N/A;   CHOLECYSTECTOMY  02/19/1989   COLONOSCOPY WITH PROPOFOL N/A 07/05/2016   Procedure: COLONOSCOPY WITH PROPOFOL;  Surgeon: Charolett Bumpers, MD;  Location: [facility]  ENDOSCOPY;  Service: Endoscopy;  Laterality: N/A;   DILATION AND CURETTAGE OF UTERUS     HERNIA REPAIR  06/21/2009   umbilical   JOINT REPLACEMENT     MOHS SURGERY     OOPHORECTOMY Bilateral    REVISION TOTAL HIP ARTHROPLASTY Left 09/19/2013   SHOULDER ARTHROSCOPY W/ ROTATOR CUFF REPAIR Right    TEMPOROMANDIBULAR JOINT SURGERY     TMJ ARTHROPLASTY     TOTAL HIP ARTHROPLASTY Left 06/22/2007   TOTAL HIP ARTHROPLASTY Right 06/21/2009   TOTAL HIP REVISION Left 09/19/2013   Procedure: LEFT TOTAL HIP REVISION;  Surgeon: Nestor Lewandowsky, MD;  Location: [facility] OR;  Service: Orthopedics;  Laterality: Left;   TUBAL LIGATION     VAGINAL HYSTERECTOMY  02/19/1989     reports that she has never smoked. She has never used smokeless tobacco. She reports current alcohol use. She reports that she does not use drugs.  Allergies  Allergen Reactions   Ace Inhibitors Cough   Codeine Nausea Only   Erythromycin Other (See Comments)    Stomach pain    Iohexol Hives     Code: HIVES, Desc: patient states develops hives w/ iv contrast/mms    Iodinated Contrast Media Other (See Comments)    Hives    Family History  Problem Relation Age of Onset   Heart attack Mother  Heart attack Maternal Aunt    Heart attack Paternal Uncle     Prior to Admission medications   Medication Sig Start Date End Date Taking? Authorizing Provider  acetaminophen (TYLENOL) 650 MG CR tablet Take 1,300 mg by mouth in the morning and at bedtime.   Yes [provider]  amLODipine (NORVASC) 5 MG tablet Take 5 mg by mouth daily. 02/08/21  Yes [provider]  Cholecalciferol (VITAMIN D-3) 5000 UNITS TABS Take 5,000 Units by mouth every Monday, Wednesday, and Friday.    Yes [provider]  citalopram (CELEXA) 20 MG tablet Take 20 mg by mouth daily. 07/31/21  Yes [provider]  Coenzyme Q10 (CO Q-10) 100 MG CAPS Take 100 mg by mouth daily.   Yes [provider]  colestipol (COLESTID) 5 g  packet Take 5 g by mouth 2 (two) times daily.  01/06/17  Yes [provider]  fexofenadine (ALLEGRA) 180 MG tablet Take 180 mg by mouth daily as needed for allergies.   Yes [provider]  Glucosamine HCl 1000 MG TABS Take 1,000 mg by mouth daily.   Yes [provider]  Magnesium 250 MG TABS Take 250 mg by mouth every Monday, Wednesday, and Friday.   Yes [provider]  Menthol, Topical Analgesic, (BIOFREEZE) 4 % GEL Apply 1 application. topically daily as needed (pain).   Yes [provider]  metolazone (ZAROXOLYN) 5 MG tablet Take 5 mg by mouth daily as needed (Takes is weight gets above 195).  06/22/17  Yes [provider]  metoprolol succinate (TOPROL-XL) 50 MG 24 hr tablet TAKE 3 TABLETS BY MOUTH EVERY DAY Patient taking differently: Take 150 mg by mouth at bedtime. 06/11/21  Yes Corky Crafts, MD  Multiple Vitamins-Minerals (MEMORY VITE PO) Take 3 tablets by mouth daily. Memory plus   Yes [provider]  Multiple Vitamins-Minerals (MULTIVITAMIN WITH MINERALS) tablet Take 1 tablet by mouth daily. 50+ for her   Yes [provider]  Multiple Vitamins-Minerals (PRESERVISION AREDS 2) CAPS Take 1 capsule by mouth in the morning and at bedtime.   Yes [provider]  potassium chloride (KLOR-CON) 10 MEQ tablet Take 10 mEq by mouth daily.   Yes [provider]  RABEprazole (ACIPHEX) 20 MG tablet Take 20 mg by mouth daily.    Yes [provider]  terazosin (HYTRIN) 10 MG capsule Take 10 mg by mouth at bedtime.   Yes [provider]  torsemide (DEMADEX) 20 MG tablet Take 20 mg by mouth daily.   Yes [provider]  valsartan (DIOVAN) 160 MG tablet Take 1 tablet (160 mg total) by mouth daily. 05/10/17  Yes Corky Crafts, MD  warfarin (COUMADIN) 5 MG tablet TAKE AS DIRECTED BY COUMADIN CLINIC Patient taking differently: Take 2.5-5 mg by mouth See admin instructions. Take as  directed by coumadin clinic; 5 mg Mon and Fri, 2.5 mg all other days. 03/13/21  Yes Corky Crafts, MD    Physical Exam: Constitutional: Moderately built and nourished. Vitals:   09/06/21 1600 09/06/21 1700 09/06/21 1800 09/06/21 1830  BP: 129/67 127/72 121/60 127/62  Pulse: 66 84 80 77  Resp: 19 14 16 16   Temp:      TempSrc:      SpO2: 96% 98% 98% 100%  Weight:      Height:       Eyes: Anicteric no pallor. ENMT: No discharge from the ears eyes nose and mouth. Neck: No mass felt.  No neck rigidity. Respiratory: No rhonchi or crepitations. Cardiovascular: S1-S2 heard. Abdomen: Soft nontender bowel sound present. Musculoskeletal: Pain on moving her left shoulder and left hip. Skin: Chronic skin changes.  Rash on the right flank area consistent with shingles. Neurologic: Alert awake oriented time place and person.  Moves all extremities. Psychiatric: Appears normal.  Normal affect.   Labs on Admission: I have personally reviewed following labs and imaging studies  CBC: Recent Labs  Lab 09/06/21 1800  WBC 5.7  NEUTROABS 4.5  HGB 11.2*  HCT 34.0*  MCV 94.2  PLT 144*   Basic Metabolic Panel: Recent Labs  Lab 09/06/21 1800  NA 138  K 3.3*  CL 108  CO2 22  GLUCOSE 131*  BUN 29*  CREATININE 0.90  CALCIUM 8.7*   GFR: Estimated Creatinine Clearance: 55.8 mL/min (by C-G formula based on SCr of 0.9 mg/dL). Liver Function Tests: No results for input(s): AST, ALT, ALKPHOS, BILITOT, PROT, ALBUMIN in the last 168 hours. No results for input(s): LIPASE, AMYLASE in the last 168 hours. No results for input(s): AMMONIA in the last 168 hours. Coagulation Profile: Recent Labs  Lab 09/06/21 1806  INR 2.8*   Cardiac Enzymes: No results for input(s): CKTOTAL, CKMB, CKMBINDEX, TROPONINI in the last 168 hours. BNP (last 3 results) No results for input(s): PROBNP in the last 8760 hours. HbA1C: No results for input(s): HGBA1C in the last 72 hours. CBG: No results for  input(s): GLUCAP in the last 168 hours. Lipid Profile: No results for input(s): CHOL, HDL, LDLCALC, TRIG, CHOLHDL, LDLDIRECT in the last 72 hours. Thyroid Function Tests: No results for input(s): TSH, T4TOTAL, FREET4, T3FREE, THYROIDAB in the last 72 hours. Anemia Panel: No results for input(s): VITAMINB12, FOLATE, FERRITIN, TIBC, IRON, RETICCTPCT in the last 72 hours. Urine analysis:    Component Value Date/Time   COLORURINE YELLOW 04/06/2017 2218   APPEARANCEUR CLEAR 04/06/2017 2218   LABSPEC 1.014 04/06/2017 2218   PHURINE 5.0 04/06/2017 2218   GLUCOSEU NEGATIVE 04/06/2017 2218   HGBUR MODERATE (A) 04/06/2017 2218   BILIRUBINUR NEGATIVE 04/06/2017 2218   KETONESUR NEGATIVE 04/06/2017 2218   PROTEINUR NEGATIVE 04/06/2017 2218   UROBILINOGEN 0.2 09/22/2013 0119   NITRITE NEGATIVE 04/06/2017 2218   LEUKOCYTESUR MODERATE (A) 04/06/2017 2218   Sepsis Labs: @LABRCNTIP (procalcitonin:4,lacticidven:4) ) Recent Results (from the past 240 hour(s))  Resp Panel by RT-PCR (Flu A&B, Covid) Nasopharyngeal Swab     Status: None   Collection Time: 09/06/21  5:45 PM   Specimen: Nasopharyngeal Swab; Nasopharyngeal(NP) swabs in vial transport medium  Result Value Ref Range Status   SARS Coronavirus 2 by RT PCR NEGATIVE NEGATIVE Final    Comment: (NOTE) SARS-CoV-2 target nucleic acids are NOT DETECTED.  The SARS-CoV-2 RNA is generally detectable in upper respiratory specimens during the acute phase of infection. The lowest concentration of SARS-CoV-2 viral copies this assay can detect is 138 copies/mL. A negative result does not preclude SARS-Cov-2 infection and should not be used as the sole basis for treatment or other patient management decisions. A negative result may occur with  improper specimen collection/handling, submission of specimen other than nasopharyngeal swab, presence of viral mutation(s) within the areas targeted by this assay, and inadequate number of viral copies(<138  copies/mL). A negative result must be combined with clinical observations, patient history, and epidemiological information. The expected result is Negative.  Fact Sheet for Patients:  BloggerCourse.com  Fact Sheet for Healthcare Providers:  SeriousBroker.it  This test is no t yet approved or cleared  by the [place] and  has been authorized for detection and/or diagnosis of SARS-CoV-2 by FDA under an Emergency Use Authorization (EUA). This EUA will remain  in effect (meaning this test can be used) for the duration of the COVID-19 declaration under Section 564(b)(1) of the Act, 21 U.S.C.section 360bbb-3(b)(1), unless the authorization is terminated  or revoked sooner.       Influenza A by PCR NEGATIVE NEGATIVE Final   Influenza B by PCR NEGATIVE NEGATIVE Final    Comment: (NOTE) The Xpert Xpress SARS-CoV-2/FLU/RSV plus assay is intended as an aid in the diagnosis of influenza from Nasopharyngeal swab specimens and should not be used as a sole basis for treatment. Nasal washings and aspirates are unacceptable for Xpert Xpress SARS-CoV-2/FLU/RSV testing.  Fact Sheet for Patients: BloggerCourse.com  Fact Sheet for Healthcare Providers: SeriousBroker.it  This test is not yet approved or cleared by the [place] FDA and has been authorized for detection and/or diagnosis of SARS-CoV-2 by FDA under an Emergency Use Authorization (EUA). This EUA will remain in effect (meaning this test can be used) for the duration of the COVID-19 declaration under Section 564(b)(1) of the Act, 21 U.S.C. section 360bbb-3(b)(1), unless the authorization is terminated or revoked.  Performed at [facility], [address], [place], [place] [address]      Radiological Exams on Admission: DG Shoulder Left  Result Date: 09/06/2021 CLINICAL DATA:  Trauma, fall, pain  EXAM: LEFT SHOULDER - 2+ VIEW COMPARISON:  None. FINDINGS: No fracture or dislocation is seen. Degenerative changes are noted with bony spurs in the inferior aspect of glenohumeral joint. IMPRESSION: No recent fracture or dislocation is seen. Degenerative changes are noted. Electronically Signed   By: Ernie Avena M.D.   On: 09/06/2021 13:03   DG Hip Unilat With Pelvis 2-3 Views Left  Result Date: 09/06/2021 CLINICAL DATA:  Trauma, fall EXAM: DG HIP (WITH OR WITHOUT PELVIS) 2-3V LEFT COMPARISON:  None. FINDINGS: There is previous arthroplasty in both hips. No recent fracture or dislocation is seen in the left hip. IMPRESSION: No recent fracture or dislocation is seen. Bilateral hip arthroplasty. Electronically Signed   By: Ernie Avena M.D.   On: 09/06/2021 13:05      Assessment/Plan Principal Problem:   Back pain Active Problems:   Left hip pain   PAF (paroxysmal atrial fibrillation) (HCC)   Essential hypertension   HLD (hyperlipidemia)   Weakness   Low back pain    Fall with significant pain in the left shoulder and hip.  We will get a CT pelvis to make sure there is no fracture.  X-rays do not show any definite fracture.  If pain persist may discuss with Dr. Jerl Santos patient's orthopedic surgeon. Shingles involving the right flank area for which Valtrex has been started.  Pharmacy to dose.  Symptoms started about 48 hours ago.  Airborne precautions. A-fib presently rate controlled continue metoprolol and Coumadin. Chronic diastolic CHF appears compensated on torsemide. Hypertension on ARB amlodipine beta-blockers and torsemide. Anemia appears to be chronic follow CBC. Thrombocytopenia follow CBC. Right knee arthritis was originally planned to have surgery this week but was canceled due to personal issues. Mild hypokalemia replace and recheck.   DVT prophylaxis: Coumadin. Code Status: Full code. Family Communication: Discussed with patient. Disposition Plan: To be  determined. Consults called: Physical therapy. Admission status: Observation.   Eduard Clos MD [facility] Pager [phone].  If 7PM-7AM, please contact night-coverage [email or web address] Password [facility]  09/06/2021, 9:05  PM

## 2021-09-06 NOTE — ED Provider Notes (Signed)
?[facility] DEPT ?Provider Note ? ? ?CSN: [id number] ?Arrival date & time: 09/06/21  1158 ? ?  ? ?History ? ?Chief Complaint  ?Patient presents with  ? Fall  ? Shoulder Pain  ? Hip Pain  ? ? ?Sierra [name] is a 76 y.o. female. ? ?76 year old female with prior medical history as detailed below presents for evaluation.  Patient reports that she was going to the [place] walk-in clinic this morning.  She was seeking evaluation for a rash on her right flank that was concerning for possible shingles.  As she was navigating a curb near the clinic she fell.  She landed on her left hip and left shoulder.  She did not strike her head.  She did not lose consciousness. ? ?She complains of pain to the left hip and left shoulder. ? ?She does take Coumadin for prior history of paroxysmal A-fib. ? ?The rash on her right flank started yesterday.  She denies significant pain related to the rash.  It is mildly itchy.  She denies prior history of shingles.  She did receive the shingles vaccine approximately 6 months previously. ? ?Of note, patient uses both a cane and a walker with ambulation.  She reports that she using a cane today when she fell. ? ?She reports chronic issues with her right knee.  She was scheduled for a right knee replacement last week.  She had to cancel this procedure secondary to her husband's illness. ? ?The history is provided by the patient and medical records.  ?Fall ?This is a new problem. The current episode started 1 to 2 hours ago. The problem occurs rarely. The problem has not changed since onset.Pertinent negatives include no chest pain and no abdominal pain. Nothing aggravates the symptoms. Nothing relieves the symptoms.  ?Shoulder Pain ?Hip Pain ?Pertinent negatives include no chest pain and no abdominal pain.  ? ?  ? ?Home Medications ?Prior to Admission medications   ?Medication Sig Start Date End Date Taking? Authorizing Provider  ?acetaminophen (TYLENOL) 650 MG CR tablet  Take 1,300 mg by mouth in the morning and at bedtime.    [provider]  ?amLODipine (NORVASC) 5 MG tablet Take 5 mg by mouth daily. 02/08/21   [provider]  ?Cholecalciferol (VITAMIN D-3) 5000 UNITS TABS Take 5,000 Units by mouth every Monday, Wednesday, and Friday.     [provider]  ?citalopram (CELEXA) 20 MG tablet Take 20 mg by mouth daily. 07/31/21   [provider]  ?Coenzyme Q10 (CO Q-10) 100 MG CAPS Take 100 mg by mouth daily.    [provider]  ?colestipol (COLESTID) 5 g packet Take 5 g by mouth 2 (two) times daily.  01/06/17   [provider]  ?fexofenadine (ALLEGRA) 180 MG tablet Take 180 mg by mouth daily.    [provider]  ?Glucosamine HCl 1000 MG TABS Take 1,000 mg by mouth daily.    [provider]  ?Lidocaine-Menthol (ICY HOT MAX LIDOCAINE) 4-1 % CREA Apply 1 application topically daily as needed (pain).    [provider]  ?MAGNESIUM CITRATE PO Take 250 mg by mouth every Monday, Wednesday, and Friday.    [provider]  ?Menthol, Topical Analgesic, (BIOFREEZE) 4 % GEL Apply 1 application topically daily as needed (pain).    [provider]  ?metolazone (ZAROXOLYN) 5 MG tablet Take 5 mg by mouth daily as needed (Takes is weight gets above 195).  06/22/17   [provider]  ?  metoprolol succinate (TOPROL-XL) 50 MG 24 hr tablet TAKE 3 TABLETS BY MOUTH EVERY DAY ?Patient taking differently: Take 150 mg by mouth at bedtime. 06/11/21   Jettie Booze, MD  ?Multiple Vitamins-Minerals (MEMORY VITE PO) Take 3 tablets by mouth daily. Memory plus    [provider]  ?Multiple Vitamins-Minerals (MULTIVITAMIN WITH MINERALS) tablet Take 1 tablet by mouth daily. 50+ for her    [provider]  ?Multiple Vitamins-Minerals (PRESERVISION AREDS 2) CAPS Take 1 capsule by mouth in the morning and at bedtime.    [provider]  ?potassium chloride (K-DUR,KLOR-CON) 10 MEQ  tablet Take 10 mEq by mouth daily.    [provider]  ?RABEprazole (ACIPHEX) 20 MG tablet Take 20 mg by mouth daily.     [provider]  ?terazosin (HYTRIN) 10 MG capsule Take 10 mg by mouth at bedtime.    [provider]  ?torsemide (DEMADEX) 20 MG tablet Take 20 mg by mouth daily.    [provider]  ?valsartan (DIOVAN) 160 MG tablet Take 1 tablet (160 mg total) by mouth daily. 05/10/17   Jettie Booze, MD  ?warfarin (COUMADIN) 5 MG tablet TAKE AS DIRECTED BY COUMADIN CLINIC ?Patient taking differently: Take 2.5-5 mg by mouth See admin instructions. Take as directed by coumadin clinic; 5 mg Mon and Fri, 2.5 mg all other days 03/13/21   Jettie Booze, MD  ?   ? ?Allergies    ?Ace inhibitors, Codeine, Erythromycin, Iohexol, and Iodinated contrast media   ? ?Review of Systems   ?Review of Systems  ?Cardiovascular:  Negative for chest pain.  ?Gastrointestinal:  Negative for abdominal pain.  ?All other systems reviewed and are negative. ? ?Physical Exam ?Updated Vital Signs ?BP (!) 163/70   Pulse 86   Temp 97.6 ?F (36.4 ?C) (Oral)   Resp (!) 23   Ht '5\' 6"'$  (1.676 m)   Wt 79.8 kg   SpO2 98%   BMI 28.41 kg/m?  ?Physical Exam ?Vitals and nursing note reviewed.  ?Constitutional:   ?   General: She is not in acute distress. ?   Appearance: Normal appearance. She is well-developed.  ?HENT:  ?   Head: Normocephalic and atraumatic.  ?Eyes:  ?   Conjunctiva/sclera: Conjunctivae normal.  ?   Pupils: Pupils are equal, round, and reactive to light.  ?Cardiovascular:  ?   Rate and Rhythm: Normal rate and regular rhythm.  ?   Heart sounds: Normal heart sounds.  ?Pulmonary:  ?   Effort: Pulmonary effort is normal. No respiratory distress.  ?   Breath sounds: Normal breath sounds.  ?Abdominal:  ?   General: There is no distension.  ?   Palpations: Abdomen is soft.  ?   Tenderness: There is no abdominal tenderness.  ?Musculoskeletal:     ?   General: No deformity. Normal range of  motion.  ?   Cervical back: Normal range of motion and neck supple.  ?   Comments: Mild diffuse tenderness to the anterior and lateral aspects of the left shoulder.  No appreciable bony step-off.  Active range of motion limited by pain. ? ?Mild diffuse tenderness to the lateral aspect of the left hip.  No significant ecchymosis or hematoma noted.  Active range of motion of the left hip is somewhat limited by pain. ? ?  ?Skin: ?   General: Skin is warm and dry.  ?   Comments: Mild vesicular rash along the right flank.  This is  consistent with likely early shingles outbreak.  It is confined to one dermatome.  ?Neurological:  ?   General: No focal deficit present.  ?   Mental Status: She is alert and oriented to person, place, and time.  ? ? ?ED Results / Procedures / Treatments   ?Labs ?(all labs ordered are listed, but only abnormal results are displayed) ?Labs Reviewed - No data to display ? ?EKG ?None ? ?Radiology ?DG Shoulder Left ? ?Result Date: 09/06/2021 ?CLINICAL DATA:  Trauma, fall, pain EXAM: LEFT SHOULDER - 2+ VIEW COMPARISON:  None. FINDINGS: No fracture or dislocation is seen. Degenerative changes are noted with bony spurs in the inferior aspect of glenohumeral joint. IMPRESSION: No recent fracture or dislocation is seen. Degenerative changes are noted. Electronically Signed   By: Elmer Picker M.D.   On: 09/06/2021 13:03  ? ?DG Hip Unilat With Pelvis 2-3 Views Left ? ?Result Date: 09/06/2021 ?CLINICAL DATA:  Trauma, fall EXAM: DG HIP (WITH OR WITHOUT PELVIS) 2-3V LEFT COMPARISON:  None. FINDINGS: There is previous arthroplasty in both hips. No recent fracture or dislocation is seen in the left hip. IMPRESSION: No recent fracture or dislocation is seen. Bilateral hip arthroplasty. Electronically Signed   By: Elmer Picker M.D.   On: 09/06/2021 13:05   ? ?Procedures ?Procedures  ? ? ?Medications Ordered in ED ?Medications  ?valACYclovir (VALTREX) tablet 500 mg (500 mg Oral Given 09/06/21 1351)   ?oxyCODONE-acetaminophen (PERCOCET/ROXICET) 5-325 MG per tablet 1 tablet (1 tablet Oral Given 09/06/21 1351)  ? ? ?ED Course/ Medical Decision Making/ A&P ?  ?                        ?Medical Decision Making ?[place]

## 2021-09-07 ENCOUNTER — Observation Stay (HOSPITAL_COMMUNITY): Payer: Medicare Other

## 2021-09-07 DIAGNOSIS — Z85828 Personal history of other malignant neoplasm of skin: Secondary | ICD-10-CM | POA: Diagnosis not present

## 2021-09-07 DIAGNOSIS — I10 Essential (primary) hypertension: Secondary | ICD-10-CM

## 2021-09-07 DIAGNOSIS — W101XXA Fall (on)(from) sidewalk curb, initial encounter: Secondary | ICD-10-CM | POA: Diagnosis present

## 2021-09-07 DIAGNOSIS — Z96643 Presence of artificial hip joint, bilateral: Secondary | ICD-10-CM | POA: Diagnosis present

## 2021-09-07 DIAGNOSIS — E785 Hyperlipidemia, unspecified: Secondary | ICD-10-CM | POA: Diagnosis not present

## 2021-09-07 DIAGNOSIS — S329XXA Fracture of unspecified parts of lumbosacral spine and pelvis, initial encounter for closed fracture: Secondary | ICD-10-CM | POA: Diagnosis not present

## 2021-09-07 DIAGNOSIS — I480 Paroxysmal atrial fibrillation: Secondary | ICD-10-CM

## 2021-09-07 DIAGNOSIS — Z7401 Bed confinement status: Secondary | ICD-10-CM | POA: Diagnosis not present

## 2021-09-07 DIAGNOSIS — M1711 Unilateral primary osteoarthritis, right knee: Secondary | ICD-10-CM | POA: Diagnosis present

## 2021-09-07 DIAGNOSIS — Z7901 Long term (current) use of anticoagulants: Secondary | ICD-10-CM | POA: Diagnosis not present

## 2021-09-07 DIAGNOSIS — G43919 Migraine, unspecified, intractable, without status migrainosus: Secondary | ICD-10-CM | POA: Diagnosis not present

## 2021-09-07 DIAGNOSIS — I959 Hypotension, unspecified: Secondary | ICD-10-CM | POA: Diagnosis present

## 2021-09-07 DIAGNOSIS — R791 Abnormal coagulation profile: Secondary | ICD-10-CM | POA: Diagnosis present

## 2021-09-07 DIAGNOSIS — M47816 Spondylosis without myelopathy or radiculopathy, lumbar region: Secondary | ICD-10-CM | POA: Diagnosis not present

## 2021-09-07 DIAGNOSIS — Z91041 Radiographic dye allergy status: Secondary | ICD-10-CM | POA: Diagnosis not present

## 2021-09-07 DIAGNOSIS — K429 Umbilical hernia without obstruction or gangrene: Secondary | ICD-10-CM | POA: Diagnosis not present

## 2021-09-07 DIAGNOSIS — Z79899 Other long term (current) drug therapy: Secondary | ICD-10-CM | POA: Diagnosis not present

## 2021-09-07 DIAGNOSIS — W19XXXA Unspecified fall, initial encounter: Secondary | ICD-10-CM | POA: Diagnosis not present

## 2021-09-07 DIAGNOSIS — D696 Thrombocytopenia, unspecified: Secondary | ICD-10-CM | POA: Diagnosis present

## 2021-09-07 DIAGNOSIS — R2681 Unsteadiness on feet: Secondary | ICD-10-CM | POA: Diagnosis not present

## 2021-09-07 DIAGNOSIS — M6281 Muscle weakness (generalized): Secondary | ICD-10-CM | POA: Diagnosis not present

## 2021-09-07 DIAGNOSIS — G8929 Other chronic pain: Secondary | ICD-10-CM | POA: Diagnosis not present

## 2021-09-07 DIAGNOSIS — D649 Anemia, unspecified: Secondary | ICD-10-CM | POA: Diagnosis not present

## 2021-09-07 DIAGNOSIS — M545 Low back pain, unspecified: Secondary | ICD-10-CM | POA: Diagnosis present

## 2021-09-07 DIAGNOSIS — M1990 Unspecified osteoarthritis, unspecified site: Secondary | ICD-10-CM | POA: Diagnosis not present

## 2021-09-07 DIAGNOSIS — Z20822 Contact with and (suspected) exposure to covid-19: Secondary | ICD-10-CM | POA: Diagnosis present

## 2021-09-07 DIAGNOSIS — D638 Anemia in other chronic diseases classified elsewhere: Secondary | ICD-10-CM | POA: Diagnosis present

## 2021-09-07 DIAGNOSIS — I110 Hypertensive heart disease with heart failure: Secondary | ICD-10-CM | POA: Diagnosis present

## 2021-09-07 DIAGNOSIS — M25519 Pain in unspecified shoulder: Secondary | ICD-10-CM | POA: Diagnosis not present

## 2021-09-07 DIAGNOSIS — Z9181 History of falling: Secondary | ICD-10-CM | POA: Diagnosis not present

## 2021-09-07 DIAGNOSIS — R52 Pain, unspecified: Secondary | ICD-10-CM | POA: Diagnosis present

## 2021-09-07 DIAGNOSIS — E876 Hypokalemia: Secondary | ICD-10-CM | POA: Diagnosis present

## 2021-09-07 DIAGNOSIS — R41841 Cognitive communication deficit: Secondary | ICD-10-CM | POA: Diagnosis not present

## 2021-09-07 DIAGNOSIS — Z888 Allergy status to other drugs, medicaments and biological substances status: Secondary | ICD-10-CM | POA: Diagnosis not present

## 2021-09-07 DIAGNOSIS — Z743 Need for continuous supervision: Secondary | ICD-10-CM | POA: Diagnosis not present

## 2021-09-07 DIAGNOSIS — R2689 Other abnormalities of gait and mobility: Secondary | ICD-10-CM | POA: Diagnosis not present

## 2021-09-07 DIAGNOSIS — S7002XA Contusion of left hip, initial encounter: Secondary | ICD-10-CM | POA: Diagnosis present

## 2021-09-07 DIAGNOSIS — Z881 Allergy status to other antibiotic agents status: Secondary | ICD-10-CM | POA: Diagnosis not present

## 2021-09-07 DIAGNOSIS — B029 Zoster without complications: Secondary | ICD-10-CM | POA: Diagnosis present

## 2021-09-07 DIAGNOSIS — M25512 Pain in left shoulder: Secondary | ICD-10-CM | POA: Diagnosis present

## 2021-09-07 DIAGNOSIS — I499 Cardiac arrhythmia, unspecified: Secondary | ICD-10-CM | POA: Diagnosis not present

## 2021-09-07 DIAGNOSIS — K219 Gastro-esophageal reflux disease without esophagitis: Secondary | ICD-10-CM | POA: Diagnosis present

## 2021-09-07 DIAGNOSIS — I5032 Chronic diastolic (congestive) heart failure: Secondary | ICD-10-CM | POA: Diagnosis present

## 2021-09-07 DIAGNOSIS — K4090 Unilateral inguinal hernia, without obstruction or gangrene, not specified as recurrent: Secondary | ICD-10-CM | POA: Diagnosis not present

## 2021-09-07 DIAGNOSIS — M25552 Pain in left hip: Secondary | ICD-10-CM | POA: Diagnosis not present

## 2021-09-07 DIAGNOSIS — Z8249 Family history of ischemic heart disease and other diseases of the circulatory system: Secondary | ICD-10-CM | POA: Diagnosis not present

## 2021-09-07 DIAGNOSIS — R531 Weakness: Secondary | ICD-10-CM | POA: Diagnosis present

## 2021-09-07 DIAGNOSIS — I4821 Permanent atrial fibrillation: Secondary | ICD-10-CM | POA: Diagnosis present

## 2021-09-07 LAB — CBC
HCT: 31.3 % — ABNORMAL LOW (ref 36.0–46.0)
Hemoglobin: 10.1 g/dL — ABNORMAL LOW (ref 12.0–15.0)
MCH: 31 pg (ref 26.0–34.0)
MCHC: 32.3 g/dL (ref 30.0–36.0)
MCV: 96 fL (ref 80.0–100.0)
Platelets: 141 10*3/uL — ABNORMAL LOW (ref 150–400)
RBC: 3.26 MIL/uL — ABNORMAL LOW (ref 3.87–5.11)
RDW: 13.5 % (ref 11.5–15.5)
WBC: 4.9 10*3/uL (ref 4.0–10.5)
nRBC: 0 % (ref 0.0–0.2)

## 2021-09-07 LAB — BASIC METABOLIC PANEL
Anion gap: 7 (ref 5–15)
BUN: 25 mg/dL — ABNORMAL HIGH (ref 8–23)
CO2: 24 mmol/L (ref 22–32)
Calcium: 8.8 mg/dL — ABNORMAL LOW (ref 8.9–10.3)
Chloride: 108 mmol/L (ref 98–111)
Creatinine, Ser: 0.89 mg/dL (ref 0.44–1.00)
GFR, Estimated: >60 mL/min (ref >60)
Glucose, Bld: 124 mg/dL — ABNORMAL HIGH (ref 70–99)
Potassium: 4 mmol/L (ref 3.5–5.1)
Sodium: 139 mmol/L (ref 135–145)

## 2021-09-07 LAB — PROTIME-INR
INR: 2.8 — ABNORMAL HIGH (ref 0.8–1.2)
Prothrombin Time: 29.7 seconds — ABNORMAL HIGH (ref 11.4–15.2)

## 2021-09-07 MED ORDER — POLYETHYLENE GLYCOL 3350 17 G PO PACK: 17.0000 g | PACK | Freq: Every day | ORAL | Status: DC | PRN

## 2021-09-07 MED ORDER — BISACODYL 10 MG RE SUPP
10.0000 mg | Freq: Every day | RECTAL | Status: DC | PRN
Start: 2021-09-07 — End: 2021-09-10
  Administered 2021-09-10: 10 mg via RECTAL
  Filled 2021-09-07: qty 1

## 2021-09-07 MED ORDER — MORPHINE SULFATE (PF) 2 MG/ML IV SOLN
0.5000 mg | INTRAVENOUS | Status: DC | PRN
  Administered 2021-09-07 – 2021-09-09 (×4): 0.5 mg via INTRAVENOUS
  Filled 2021-09-07 (×4): qty 1

## 2021-09-07 MED ORDER — SENNOSIDES-DOCUSATE SODIUM 8.6-50 MG PO TABS
1.0000 | ORAL_TABLET | Freq: Two times a day (BID) | ORAL | Status: DC
  Administered 2021-09-07 – 2021-09-10 (×7): 1 via ORAL
  Filled 2021-09-07 (×7): qty 1

## 2021-09-07 MED ORDER — OXYCODONE-ACETAMINOPHEN 5-325 MG PO TABS
1.0000 | ORAL_TABLET | ORAL | Status: DC | PRN
  Administered 2021-09-07: 2 via ORAL
  Administered 2021-09-08 – 2021-09-09 (×5): 1 via ORAL
  Filled 2021-09-07: qty 1
  Filled 2021-09-07: qty 2
  Filled 2021-09-07 (×5): qty 1

## 2021-09-07 MED ORDER — POTASSIUM CHLORIDE CRYS ER 20 MEQ PO TBCR: 20.0000 meq | EXTENDED_RELEASE_TABLET | Freq: Once | ORAL | Status: DC

## 2021-09-07 MED ORDER — WARFARIN SODIUM 5 MG PO TABS
5.0000 mg | ORAL_TABLET | Freq: Once | ORAL | Status: AC
  Administered 2021-09-07: 5 mg via ORAL
  Filled 2021-09-07: qty 1

## 2021-09-07 NOTE — TOC Initial Note (Signed)
Transition of Care (TOC) - Initial/Assessment Note  ? ? ?Patient Details  ?Name: Sierra [name] ?MRN: [id number] ?Date of Birth: 1946-05-01 ? ?Transition of Care (TOC) CM/SW Contact:    ?[place], LCSW ?Phone Number: ?09/07/2021, 3:48 PM ? ?Clinical Narrative:                 ? ?CSW discussed disposition with pt and pt spouse on speaker phone. Pt lives at home with spouse in [place].  CSW explained SNF recommendation and SNF process. Pt and pt spouse are agreeable to SNF workup. CSW provided instructions for pt spouse to use medicare.gov to identify medicare star rating snf list. CSW completed fl2 and faxed bed requests in hub.  ? ?Expected Discharge Plan: [place] ?Barriers to Discharge: Continued Medical Work up ? ? ?Patient Goals and CMS Choice ?  ?  ?  ? ?Expected Discharge Plan and Services ?Expected Discharge Plan: [place] ?  ?  ?  ?Living arrangements for the past 2 months: [place] ?                ?  ?  ?  ?  ?  ?  ?  ?  ?  ?  ? ?Prior Living Arrangements/Services ?Living arrangements for the past 2 months: [place] ?Lives with:: Spouse ?  ?       ?  ?  ?  ?  ? ?Activities of Daily Living ?Home Assistive Devices/Equipment: Cane (specify quad or straight), Walker (specify type) ?ADL Screening (condition at time of admission) ?Patient's cognitive ability adequate to safely complete daily activities?: Yes ?Is the patient deaf or have difficulty hearing?: No ?Does the patient have difficulty seeing, even when wearing glasses/contacts?: No ?Does the patient have difficulty concentrating, remembering, or making decisions?: No ?Patient able to express need for assistance with ADLs?: Yes ?Does the patient have difficulty dressing or bathing?: No ?Independently performs ADLs?: Yes (appropriate for developmental age) ?Does the patient have difficulty walking or climbing stairs?: Yes (d/t knee) ?Weakness of Legs: None ?Weakness of Arms/Hands: None ? ?Permission  Sought/Granted ?  ?Permission granted to share information with : Yes, Verbal Permission Granted ? Share Information with NAME: Sierra [name] (Spouse)   [phone] (Mobile) ?   ?   ?   ? ?Emotional Assessment ?  ?  ?  ?Orientation: : Oriented to Self, Oriented to Place, Oriented to  Time, Oriented to Situation ?Alcohol / Substance Use: Not Applicable ?Psych Involvement: No (comment) ? ?Admission diagnosis:  Low back pain [M54.50] ?Weakness [R53.1] ?Left hip pain [M25.552] ?Herpes zoster without complication [K16.0] ?Fall, initial encounter [W19.XXXA] ?Acute pain of left shoulder [M25.512] ?Intractable pain [R52] ?Patient Active Problem List  ? Diagnosis Date Noted  ? Intractable pain 09/07/2021  ? Weakness 09/06/2021  ? Back pain 09/06/2021  ? Low back pain 09/06/2021  ? Herpes zoster without complication   ? Persistent atrial fibrillation ([place])   ? Chronic diastolic heart failure ([place]) 05/10/2017  ? Bilateral leg edema 05/10/2017  ? Encounter for therapeutic drug monitoring 04/18/2017  ? Bacteremia due to Klebsiella pneumoniae 04/08/2017  ? Sepsis ([place]) 04/07/2017  ? Complicated urinary tract infection 04/07/2017  ? Depression 04/07/2017  ? HLD (hyperlipidemia) 04/07/2017  ? Arthritis   ? Essential hypertension 07/29/2015  ? PAC (premature atrial contraction) 07/29/2015  ? PAF (paroxysmal atrial fibrillation) ([place]) 06/11/2015  ? Left hip pain 09/19/2013  ?  Class: Chronic  ? 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 ?PCP:  Lavone Orn, MD ?Pharmacy:   ?[id number] [id number]?Phone: [phone]: [phone]? ? ? ? ?Social Determinants of Health (SDOH) Interventions ?  ? ?Readmission Risk Interventions ?No flowsheet data found. ? ? ?

## 2021-09-07 NOTE — [place] FL2 (Addendum)
?[place] MEDICAID FL2 LEVEL OF CARE SCREENING TOOL  ?  ? ?IDENTIFICATION  ?Patient Name: ?Sierra [name] Birthdate: 12/24/1945 Sex: female Admission Date (Current Location): ?09/06/2021  ?[place] and [place] Number: ? [place] ?  Facility and Address:  ?[facility], [place] [address], [place], [place] [address] ?     Provider Number: ?[id number]  ?Attending Physician Name and Address:  ?Aline August, MD ? Relative Name and Phone Number:  ?Sibbie, Flammia (Spouse)   [phone] [facility]) ?   ?Current Level of Care: ?Hospital Recommended Level of Care: ?[place] Prior Approval Number: ?  ? ?Date Approved/Denied: ?  PASRR Number: ? [id number] ? ?Discharge Plan: ?SNF ?  ? ?Current Diagnoses: ?Patient Active Problem List  ? Diagnosis Date Noted  ? Intractable pain 09/07/2021  ? Weakness 09/06/2021  ? Back pain 09/06/2021  ? Low back pain 09/06/2021  ? Herpes zoster without complication   ? Persistent atrial fibrillation ([place])   ? Chronic diastolic heart failure ([place]) 05/10/2017  ? Bilateral leg edema 05/10/2017  ? Encounter for therapeutic drug monitoring 04/18/2017  ? Bacteremia due to Klebsiella pneumoniae 04/08/2017  ? Sepsis ([place]) 04/07/2017  ? Complicated urinary tract infection 04/07/2017  ? Depression 04/07/2017  ? HLD (hyperlipidemia) 04/07/2017  ? Arthritis   ? Essential hypertension 07/29/2015  ? PAC (premature atrial contraction) 07/29/2015  ? PAF (paroxysmal atrial fibrillation) ([place]) 06/11/2015  ? Left hip pain 09/19/2013  ? 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 ? ?Orientation RESPIRATION BLADDER Height & Weight   ?  ?Self, Time, Situation, Place ? Normal Continent Weight: 176 lb (79.8 kg) ?Height:  '5\' 6"'$  (167.6 cm)  ?BEHAVIORAL SYMPTOMS/MOOD NEUROLOGICAL BOWEL NUTRITION STATUS  ?    Continent Diet (See d/c summary)  ?AMBULATORY STATUS COMMUNICATION OF NEEDS Skin   ?Extensive Assist Verbally Normal ?  ?  ?  ?    ?     ?     ? ? ?Personal Care Assistance Level of  Assistance  ?Bathing, Dressing, Feeding Bathing Assistance: Maximum assistance ?Feeding assistance: Independent ?Dressing Assistance: Maximum assistance ?   ? ?Functional Limitations Info  ?Sight, Hearing, Speech Sight Info: Impaired ?Hearing Info: Adequate ?Speech Info: Adequate  ? ? ?SPECIAL CARE FACTORS FREQUENCY  ?PT (By licensed PT), OT (By licensed OT)   ?  ?PT Frequency: 5x/week ?OT Frequency: 5x/week ?  ?  ?  ?   ? ? ?Contractures Contractures Info: Not present  ? ? ?Additional Factors Info  ?Code Status, Allergies Code Status Info: Full ?Allergies Info: Erythromycin, ace inhibitors, Iohexol, codeine, Iodinated Contrast Media ?  ?  ?  ?   ? ?Current Medications (09/07/2021):  This is the current hospital active medication list ?Current Facility-Administered Medications  ?Medication Dose Route Frequency Provider Last Rate Last Admin  ? amLODipine (NORVASC) tablet 5 mg  5 mg Oral Daily Rise Patience, MD   5 mg at 09/07/21 0944  ? bisacodyl (DULCOLAX) suppository 10 mg  10 mg Rectal Daily PRN Aline August, MD      ? citalopram (CELEXA) tablet 20 mg  20 mg Oral Daily Rise Patience, MD   20 mg at 09/07/21 1015  ? colestipol (COLESTID) packet 5 g  5 g Oral BID Rise Patience, MD   5 g at 09/07/21 1015  ? irbesartan (AVAPRO) tablet 150 mg  150 mg Oral Daily Rise Patience, MD   150 mg at 09/07/21 0944  ? magnesium oxide (MAG-OX)  tablet 200 mg  200 mg Oral Q M,W,F Rise Patience, MD   200 mg at 09/07/21 3846  ? metoprolol succinate (TOPROL-XL) 24 hr tablet 150 mg  150 mg Oral QHS Rise Patience, MD   150 mg at 09/06/21 2142  ? morphine (PF) 2 MG/ML injection 0.5 mg  0.5 mg Intravenous Q2H PRN Rise Patience, MD   0.5 mg at 09/07/21 1016  ? oxyCODONE-acetaminophen (PERCOCET/ROXICET) 5-325 MG per tablet 1-2 tablet  1-2 tablet Oral Q4H PRN Aline August, MD      ? pantoprazole (PROTONIX) EC tablet 40 mg  40 mg Oral Daily Rise Patience, MD   40 mg at 09/07/21 0944  ?  polyethylene glycol (MIRALAX / GLYCOLAX) packet 17 g  17 g Oral Daily PRN Starla Link, Kshitiz, MD      ? potassium chloride (KLOR-CON) CR tablet 10 mEq  10 mEq Oral Daily Rise Patience, MD   10 mEq at 09/07/21 0945  ? potassium chloride SA (KLOR-CON M) CR tablet 20 mEq  20 mEq Oral Once Rise Patience, MD      ? senna-docusate (Senokot-S) tablet 1 tablet  1 tablet Oral BID Aline August, MD   1 tablet at 09/07/21 1237  ? terazosin (HYTRIN) capsule 10 mg  10 mg Oral QHS Rise Patience, MD   10 mg at 09/06/21 2142  ? torsemide (DEMADEX) tablet 20 mg  20 mg Oral Daily Rise Patience, MD   20 mg at 09/07/21 1015  ? valACYclovir (VALTREX) tablet 1,000 mg  1,000 mg Oral TID Rise Patience, MD   1,000 mg at 09/07/21 0944  ? warfarin (COUMADIN) tablet 5 mg  5 mg Oral ONCE-1600 Dimple Nanas, RPH      ? Warfarin - Pharmacist Dosing Inpatient   Does not apply q1600 Polly Cobia, RPH      ? ? ? ?Discharge Medications: ?Please see discharge summary for a list of discharge medications. ? ?Relevant Imaging Results: ? ?Relevant Lab Results: ? ? ?Additional Information ?SSN [id number] ? ?[place], LCSW ? ? ? ? ?

## 2021-09-07 NOTE — ED Notes (Signed)
Patient transported to floor at this time.

## 2021-09-07 NOTE — ED Notes (Signed)
Patient resting quietly at this time. No complaints voiced ?

## 2021-09-07 NOTE — Evaluation (Signed)
Physical Therapy Evaluation ?Patient Details ?Name: Sierra [name] ?MRN: [id number] ?DOB: 07-12-1945 ?Today's Date: 09/07/2021 ? ?History of Present Illness ? 76 y.o. female with history of A-fib, diastolic CHF, hypertension, bilateral THA, Bilat. knee DJD presented with a mechanical fall and left shoulder and left hip pain.  On presentation x-rays of the left shoulder and hip did not show any fracture.  CT of the left pelvis did not show any fracture or hardware loosening but showed possible hematoma. was recently scheduled for RTKA 3/21 but cancelled due to spouse illness.  ?Clinical Impression ? Patient [resents with significant pain and inability to actively move/tolerate any ROM of the Left shoulder. Patient has to use right  hand to move the left UE, ever so gently and with limited use/ROM. Patient also endorses L hip pain with attempts  attempts to move. Patient required   much extra time to finally sit on the bed edge. Max assist to return legs to bed . ? Patient may benefit from an orthopedic evaluation of the Left shoulder with such noted limited movement and pain.  ?Pt admitted with above diagnosis.  Pt currently with functional limitations due to the deficits listed below (see PT Problem List). Pt will benefit from skilled PT to increase their independence and safety with mobility to allow discharge to the venue listed below.   ? Patient may require post acute rehab, patient reports a WC does not perform well in her home and her spouse his own medical issues. ? ?   ? ?Recommendations for follow up therapy are one component of a multi-disciplinary discharge planning process, led by the attending physician.  Recommendations may be updated based on patient status, additional functional criteria and insurance authorization. ? ?Follow Up Recommendations Skilled nursing-short term rehab (<3 hours/day) ? ?  ?Assistance Recommended at Discharge Frequent or constant Supervision/Assistance  ?Patient can return home  with the following ?   ? ?  ?Equipment Recommendations  (TBD)  ?Recommendations for Other Services ? OT consult (ORTHO  C/S for LUE)  ?  ?Functional Status Assessment Patient has had a recent decline in their functional status and demonstrates the ability to make significant improvements in function in a reasonable and predictable amount of time.  ? ?  ?Precautions / Restrictions Precautions ?Precautions: Fall ?Precaution Comments: L shoulder painful to touch and move, L side trunk shingles ?Restrictions ?Weight Bearing Restrictions: No  ? ?  ? ?Mobility ? Bed Mobility ?Overal bed mobility: Needs Assistance ?Bed Mobility: Sidelying to Sit, Sit to Supine ?  ?Sidelying to sit: HOB elevated, Mod assist ?  ?Sit to supine: Max assist, HOB elevated ?  ?General bed mobility comments: with exceptional amount of time, HOB raised to the  maximum, use of right rail only, patient able to "walk" left leg to get foot over bed edge,  slowly ablw to riase trunk to sitting upright. PATIENT UNABLE TO USE THE LEFT UE AT ALL AND MUST KEEP CLOSE TO TRuNK. to return to supine, assisted both legs onto bed while holding right rail, unable to use the left UE ?  ? ?Transfers ?  ?  ?  ?  ?  ?  ?  ?  ?  ?General transfer comment: unable  to stnad ?  ? ?Ambulation/Gait ?  ?  ?  ?  ?  ?  ?  ?  ? ?Stairs ?  ?  ?  ?  ?  ? ?Wheelchair Mobility ?  ? ?Modified Rankin (Stroke Patients Only) ?  ? ?  ? ?  Balance Overall balance assessment: Needs assistance, History of Falls ?Sitting-balance support: Feet supported, Single extremity supported ?Sitting balance-Leahy Scale: Poor ?Sitting balance - Comments: reliant to support with the RUE when seated on bed edge ?  ?  ?  ?Standing balance comment: NT ?  ?  ?  ?  ?  ?  ?  ?  ?  ?  ?  ?   ? ? ? ?Pertinent Vitals/Pain Pain Assessment ?Pain Assessment: 0-10 ?Pain Score: 10-Worst pain ever ?Pain Location: left shoulder when  moved or touched on back of shoulder, Left hip when moving ?Pain Descriptors /  Indicators: Discomfort, Sharp, Moaning, Guarding, Grimacing ?Pain Intervention(s): Monitored during session, Limited activity within patient's tolerance, Repositioned, Premedicated before session  ? ? ?Home Living Family/patient expects to be discharged to:: Private residence ?Living Arrangements: Spouse/significant other ?Available Help at Discharge: Family;Available 24 hours/day ?Type of Home: House ?Home Access: Stairs to enter ?Entrance Stairs-Rails: Right;Left ?Entrance Stairs-Number of Steps: 4 ?  ?Home Layout: One level ?Home Equipment: Kasandra Knudsen - single point;Shower seat - built in ?Additional Comments: patient was scheduled for LTKA3#/21 but cancelled due to spouse having medical issues, then she go shingles  ?  ?Prior Function Prior Level of Function : Independent/Modified Independent ?  ?  ?  ?  ?  ?  ?Mobility Comments: drives ?  ?  ? ? ?Hand Dominance  ? Dominant Hand: Right ? ?  ?Extremity/Trunk Assessment  ? Upper Extremity Assessment ?Upper Extremity Assessment: LUE deficits/detail ?LUE Deficits / Details: significantly limited with any flexion , ABD, rotation- To be eval'd by OT ?  ? ?Lower Extremity Assessment ?Lower Extremity Assessment: LLE deficits/detail ?LLE Deficits / Details: limited  hip flexion to ~ 90 when seated, requires axtra time to" walk" foot to  bed edge, required max asssit to lift lage back onto the bed. ?  ? ?Cervical / Trunk Assessment ?Cervical / Trunk Assessment: Other exceptions ?Cervical / Trunk Exceptions: limited neck roeation with pain  ?Communication  ? Communication: No difficulties  ?Cognition Arousal/Alertness: Awake/alert ?Behavior During Therapy: [facility] for tasks assessed/performed, Anxious ?Overall Cognitive Status: Within Functional Limits for tasks assessed ?  ?  ?  ?  ?  ?  ?  ?  ?  ?  ?  ?  ?  ?  ?  ?  ?General Comments: about injuries and lack of ability to mobilize ?  ?  ? ?  ?General Comments   ? ?  ?Exercises    ? ?Assessment/Plan  ?  ?PT Assessment Patient needs  continued PT services  ?PT Problem List Decreased strength;Decreased mobility;Decreased safety awareness;Decreased range of motion;Decreased knowledge of precautions;Decreased coordination;Decreased activity tolerance;Decreased balance;Pain ? ?   ?  ?PT Treatment Interventions DME instruction;Therapeutic activities;Gait training;Therapeutic exercise;Patient/family education;Functional mobility training   ? ?PT Goals (Current goals can be found in the Care Plan section)  ?Acute Rehab PT Goals ?Patient Stated Goal: to go home ?PT Goal Formulation: With patient ?Time For Goal Achievement: 09/21/21 ?Potential to Achieve Goals: Fair ? ?  ?Frequency Min 2X/week ?  ? ? ?Co-evaluation   ?  ?  ?  ?  ? ? ?  ?AM-PAC PT "6 Clicks" Mobility  ?Outcome Measure Help needed turning from your back to your side while in a flat bed without using bedrails?: Total ?Help needed moving from lying on your back to sitting on the side of a flat bed without using bedrails?: Total ?Help needed moving to and from a bed to a  chair (including a wheelchair)?: Total ?Help needed standing up from a chair using your arms (e.g., wheelchair or bedside chair)?: Total ?Help needed to walk in hospital room?: Total ?Help needed climbing 3-5 steps with a railing? : Total ?6 Click Score: 6 ? ?  ?End of Session   ?Activity Tolerance: Patient limited by pain ?Patient left: in bed;with call bell/phone within reach;with bed alarm set ?Nurse Communication: Mobility status ?PT Visit Diagnosis: Unsteadiness on feet (R26.81);History of falling (Z91.81);Pain ?Pain - Right/Left: Left ?Pain - part of body: Shoulder ?  ? ?Time: 2355-7322 ?PT Time Calculation (min) (ACUTE ONLY): 54 min ? ? ?Charges:   PT Evaluation ?$PT Eval Moderate Complexity: 1 Mod ?PT Treatments ?$Therapeutic Activity: 38-52 mins ?  ?   ? ? ?Tresa Endo PT ?Acute Rehabilitation Services ?Pager [phone] ?Office [phone]. ? ?Sierra [name] ?09/07/2021, 12:51 PM ? ?

## 2021-09-07 NOTE — ED Notes (Signed)
Patient updated on plan of care

## 2021-09-07 NOTE — ED Notes (Signed)
Patient reports tramadol is not helping control pain.  Will make MD aware  ?

## 2021-09-07 NOTE — ED Notes (Signed)
Oral potassium needs to be held until 0430 per patient due to taking colestipol at 0030 ?

## 2021-09-07 NOTE — Progress Notes (Addendum)
ANTICOAGULATION CONSULT NOTE - Initial Consult ? ?Pharmacy Consult for warfarin ?Indication: atrial fibrillation ? ?Allergies  ?Allergen Reactions  ? Ace Inhibitors Cough  ? Codeine Nausea Only  ? Erythromycin Other (See Comments)  ?  Stomach pain ?  ? Iohexol Hives  ?   Code: HIVES, Desc: patient states develops hives w/ iv contrast/mms ?  ? Iodinated Contrast Media Other (See Comments)  ?  Hives  ? ? ?Patient Measurements: ?Height: '5\' 6"'$  (167.6 cm) ?Weight: 79.8 kg (176 lb) ?IBW/kg (Calculated) : 59.3 ? ?Vital Signs: ?Temp: 98.6 ?F (37 ?C) (03/20 0450) ?Temp Source: Oral (03/20 0450) ?BP: 136/54 (03/20 0450) ?Pulse Rate: 87 (03/20 0450) ? ?Labs: ?Recent Labs  ?  09/06/21 ?1800 09/06/21 ?1806 09/07/21 ?9604  ?HGB 11.2*  --  10.1*  ?HCT 34.0*  --  31.3*  ?PLT 144*  --  141*  ?LABPROT  --  29.4* 29.7*  ?INR  --  2.8* 2.8*  ?CREATININE 0.90  --  0.89  ? ? ? ?Estimated Creatinine Clearance: 56.4 mL/min (by C-G formula based on SCr of 0.89 mg/dL). ? ? ?Medical History: ?Past Medical History:  ?Diagnosis Date  ? Anxiety   ? Arthritis   ? "joints" (09/19/2013)  ? Atrial fibrillation ([place])   ? 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 CHF (congestive heart failure) ([place])   ? Chronic lower back pain   ? Dysrhythmia   ? GERD (gastroesophageal reflux disease)   ? H/O hiatal hernia   ? Hypertension   ? Migraine   ? "haven't had any in a long time" (09/19/2013)  ? Pneumonia ~ 2012  ? ? ?Medications:  ?Medications Prior to Admission  ?Medication Sig Dispense Refill Last Dose  ? acetaminophen (TYLENOL) 650 MG CR tablet Take 1,300 mg by mouth in the morning and at bedtime.   09/06/2021  ? amLODipine (NORVASC) 5 MG tablet Take 5 mg by mouth daily.   09/06/2021  ? Cholecalciferol (VITAMIN D-3) 5000 UNITS TABS Take 5,000 Units by mouth every Monday, Wednesday, and Friday.    09/04/2021  ? citalopram (CELEXA) 20 MG tablet Take 20 mg by mouth daily.   09/06/2021  ? Coenzyme Q10 (CO Q-10) 100 MG CAPS Take 100 mg by mouth daily.   09/06/2021  ?  colestipol (COLESTID) 5 g packet Take 5 g by mouth 2 (two) times daily.   3 09/06/2021  ? fexofenadine (ALLEGRA) 180 MG tablet Take 180 mg by mouth daily as needed for allergies.   09/05/2021  ? Glucosamine HCl 1000 MG TABS Take 1,000 mg by mouth daily.   09/05/2021  ? Magnesium 250 MG TABS Take 250 mg by mouth every Monday, Wednesday, and Friday.   09/04/2021  ? Menthol, Topical Analgesic, (BIOFREEZE) 4 % GEL Apply 1 application. topically daily as needed (pain).   09/05/2021  ? metolazone (ZAROXOLYN) 5 MG tablet Take 5 mg by mouth daily as needed (Takes is weight gets above 195).   0 Past Month  ? metoprolol succinate (TOPROL-XL) 50 MG 24 hr tablet TAKE 3 TABLETS BY MOUTH EVERY DAY (Patient taking differently: Take 150 mg by mouth at bedtime.) 270 tablet 2 09/05/2021 at 11 pm  ? Multiple Vitamins-Minerals (MEMORY VITE PO) Take 3 tablets by mouth daily. Memory plus   09/05/2021  ? Multiple Vitamins-Minerals (MULTIVITAMIN WITH MINERALS) tablet Take 1 tablet by mouth daily. 50+ for her   09/06/2021  ? Multiple Vitamins-Minerals (PRESERVISION AREDS 2) CAPS Take 1 capsule by mouth in  the morning and at bedtime.   09/05/2021  ? potassium chloride (KLOR-CON) 10 MEQ tablet Take 10 mEq by mouth daily.   09/05/2021  ? RABEprazole (ACIPHEX) 20 MG tablet Take 20 mg by mouth daily.    09/06/2021  ? terazosin (HYTRIN) 10 MG capsule Take 10 mg by mouth at bedtime.   09/05/2021  ? torsemide (DEMADEX) 20 MG tablet Take 20 mg by mouth daily.   09/05/2021  ? valsartan (DIOVAN) 160 MG tablet Take 1 tablet (160 mg total) by mouth daily. 90 tablet 3 09/06/2021  ? warfarin (COUMADIN) 5 MG tablet TAKE AS DIRECTED BY COUMADIN CLINIC (Patient taking differently: Take 2.5-5 mg by mouth See admin instructions. Take as directed by coumadin clinic; 5 mg Mon and Fri, 2.5 mg all other days.) 80 tablet 1 09/06/2021 at 7 pm  ? ?Scheduled:  ? amLODipine  5 mg Oral Daily  ? citalopram  20 mg Oral Daily  ? colestipol  5 g Oral BID  ? irbesartan  150 mg Oral Daily   ? magnesium oxide  200 mg Oral Q M,W,F  ? metoprolol succinate  150 mg Oral QHS  ? pantoprazole  40 mg Oral Daily  ? potassium chloride  10 mEq Oral Daily  ? potassium chloride  20 mEq Oral Once  ? terazosin  10 mg Oral QHS  ? torsemide  20 mg Oral Daily  ? valACYclovir  1,000 mg Oral TID  ? Warfarin - Pharmacist Dosing Inpatient   Does not apply [id number]  ? ?PRN:  ? ?Assessment: ?38 yoF with PMH PAF on warfarin PTA, CHF, HTN, admitted 3/19 with shoulder & hip pain after fall. Patient was reportedly on her way to clinic to be seen for possible new shingles rash. No ortho consult or mention of ortho procedures at this point, and Pharmacy to dose warfarin. ? ?Baseline INR therapeutic ?Prior anticoagulation: warfarin 5 mg on Mon, Fri; 2.5 mg all other days; LD 3/19 (took dose in ED) ? ?Significant events: ?CT pelvis w/o definite fracture - plan to involve ortho if pain persists per H&P ? ?Today, 09/07/2021: ?CBC: Hgb and plt both slightly low > appears chronic ?INR therapeutic at 2.8 ?Major drug interactions: noted that citalopram may increase risk of bleeding, however patient has been maintained on this regimen PTA. ?No signs of bleeding reported ?Diet ordered ? ?Goal of Therapy: ?INR 2-3 ? ?Plan: ?Continue home dose of warfarin '5mg'$  PO today at 1600 ?Daily INR ?CBC at least q72 hr while on warfarin ?Monitor for signs of bleeding or thrombosis ? ?Dimple Nanas, PharmD ?09/07/2021 8:59 AM ? ?

## 2021-09-07 NOTE — Progress Notes (Signed)
? ?   ? ?  RE:   Sierra [name]     ?Date of Birth:  [birth year]    ?Date:   09/07/21    ? ? ?To Whom It May Concern: ? ?Please be advised that the above-named patient will require a short-term nursing home stay - anticipated 30 days or less for rehabilitation and strengthening.  The plan is for return home. ? ? ? ?

## 2021-09-07 NOTE — Progress Notes (Signed)
?PROGRESS NOTE ? ? ? ?Sierra [name]  RCB:[id number] DOB: 12-Aug-1945 DOA: 09/06/2021 ?PCP: Lavone Orn, MD  ? ?Brief Narrative:  ?76 y.o. female with history of A-fib, diastolic CHF, hypertension presented with a mechanical fall and left shoulder and left hip pain.  On presentation x-rays of the left shoulder and hip did not show any fracture.  CT of the left pelvis did not show any fracture or hardware loosening but showed possible hematoma. ? ?Assessment & Plan: ?  ?Mechanical fall with severe left shoulder and hip pain ?Possible left hip hematoma ?-X-rays did not show any fracture.  CT of the left pelvis did not show any fracture or hardware loosening but showed possible hematoma. ?-Still complains of significant pain.  Continue pain management.  PT eval. ?-I have notified orthopedics/Dr. Rhona Raider.  Follow recommendations. ? ?Possible shingles over the right flank area ?-Continue Valtrex for a week.  Continue isolation. ? ?Paroxysmal A-fib ?-Currently rate controlled.  Continue metoprolol and Coumadin.  Coumadin to be dosed by pharmacy.  Monitor INR.  Outpatient follow-up with cardiology. ? ?Chronic diastolic CHF ?-Currently compensated.  Continue metoprolol, torsemide.  Strict input and output.  Daily weights.  Fluid restriction. ? ?Hypertension ?-Monitor blood pressure.  Continue amlodipine, torsemide and beta-blockers ? ?Anemia of chronic disease ?-Possibly from chronic illnesses ?-Hemoglobin stable.  Monitor intermittently ? ?Thrombocytopenia ?-Questionable cause.  No signs of bleeding.  Monitor ? ?Hypokalemia ?-resolved ? ?Right knee arthritis ?-Was supposed to have surgery as an outpatient soon but was canceled ? ? ?DVT prophylaxis: Coumadin ?Code Status: Full ?Family Communication: None at bedside ?Disposition Plan: ?Status is: Observation ?The patient will require care spanning > 2 midnights and should be moved to inpatient because: Still in significant pain.  PT evaluation is pending. ? ? ? ?Consultants:  Orthopedics ? ?Procedures: None ? ?Antimicrobials: None ? ? ?Subjective: ?Patient seen and examined at bedside.  No overnight fever, nausea, vomiting.  Still complains of significant left shoulder and hip pain. ? ?Objective: ?Vitals:  ? 09/06/21 2300 09/07/21 0200 09/07/21 0450 09/07/21 0944  ?BP: 119/62 135/62 (!) 136/54 (!) 124/51  ?Pulse: 93 89 87   ?Resp: '18 18 20   '$ ?Temp:   98.6 ?F (37 ?C)   ?TempSrc:   Oral   ?SpO2: 97% 91% 97%   ?Weight:      ?Height:      ? ?No intake or output data in the 24 hours ending 09/07/21 1101 ?Filed Weights  ? 09/06/21 1220  ?Weight: 79.8 kg  ? ? ?Examination: ? ?General exam: Appears calm and comfortable.  On room air.  Elderly female lying in bed. ?Respiratory system: Bilateral decreased breath sounds at bases ?Cardiovascular system: S1 & S2 heard, Rate controlled ?Gastrointestinal system: Abdomen is nondistended, soft and nontender. Normal bowel sounds heard. ?Extremities: No cyanosis, clubbing; mild lower extremity edema present.  Left shoulder and left hip tenderness present ?Central nervous system: Alert and oriented. No focal neurological deficits. Moving extremities ?Skin: Dermatomal rash on the right flank.  No other ecchymosis/lesions  ?psychiatry: Judgement and insight appear normal. Mood & affect appropriate.  ? ? ? ?Data Reviewed: I have personally reviewed following labs and imaging studies ? ?CBC: ?Recent Labs  ?Lab 09/06/21 ?1800 09/07/21 ?8032  ?WBC 5.7 4.9  ?NEUTROABS 4.5  --   ?HGB 11.2* 10.1*  ?HCT 34.0* 31.3*  ?MCV 94.2 96.0  ?PLT 144* 141*  ? ?Basic Metabolic Panel: ?Recent Labs  ?Lab 09/06/21 ?1800 09/07/21 ?1224  ?NA 138 139  ?K 3.3*  4.0  ?CL 108 108  ?CO2 22 24  ?GLUCOSE 131* 124*  ?BUN 29* 25*  ?CREATININE 0.90 0.89  ?CALCIUM 8.7* 8.8*  ? ?GFR: ?Estimated Creatinine Clearance: 56.4 mL/min (by C-G formula based on SCr of 0.89 mg/dL). ?Liver Function Tests: ?No results for input(s): AST, ALT, ALKPHOS, BILITOT, PROT, ALBUMIN in the last 168 hours. ?No results  for input(s): LIPASE, AMYLASE in the last 168 hours. ?No results for input(s): AMMONIA in the last 168 hours. ?Coagulation Profile: ?Recent Labs  ?Lab 09/06/21 ?1806 09/07/21 ?6734  ?INR 2.8* 2.8*  ? ?Cardiac Enzymes: ?No results for input(s): CKTOTAL, CKMB, CKMBINDEX, TROPONINI in the last 168 hours. ?BNP (last 3 results) ?No results for input(s): PROBNP in the last 8760 hours. ?HbA1C: ?No results for input(s): HGBA1C in the last 72 hours. ?CBG: ?No results for input(s): GLUCAP in the last 168 hours. ?Lipid Profile: ?No results for input(s): CHOL, HDL, LDLCALC, TRIG, CHOLHDL, LDLDIRECT in the last 72 hours. ?Thyroid Function Tests: ?No results for input(s): TSH, T4TOTAL, FREET4, T3FREE, THYROIDAB in the last 72 hours. ?Anemia Panel: ?No results for input(s): VITAMINB12, FOLATE, FERRITIN, TIBC, IRON, RETICCTPCT in the last 72 hours. ?Sepsis Labs: ?No results for input(s): PROCALCITON, LATICACIDVEN in the last 168 hours. ? ?Recent Results (from the past 240 hour(s))  ?Resp Panel by RT-PCR (Flu A&B, Covid) Nasopharyngeal Swab     Status: None  ? Collection Time: 09/06/21  5:45 PM  ? Specimen: Nasopharyngeal Swab; Nasopharyngeal(NP) swabs in vial transport medium  ?Result Value Ref Range Status  ? SARS Coronavirus 2 by RT PCR NEGATIVE NEGATIVE Final  ?  Comment: (NOTE) ?SARS-CoV-2 target nucleic acids are NOT DETECTED. ? ?The SARS-CoV-2 RNA is generally detectable in upper respiratory ?specimens during the acute phase of infection. The lowest ?concentration of SARS-CoV-2 viral copies this assay can detect is ?138 copies/mL. A negative result does not preclude SARS-Cov-2 ?infection and should not be used as the sole basis for treatment or ?other patient management decisions. A negative result may occur with  ?improper specimen collection/handling, submission of specimen other ?than nasopharyngeal swab, presence of viral mutation(s) within the ?areas targeted by this assay, and inadequate number of viral ?copies(<138  copies/mL). A negative result must be combined with ?clinical observations, patient history, and epidemiological ?information. The expected result is Negative. ? ?Fact Sheet for Patients:  ?EntrepreneurPulse.com.au ? ?Fact Sheet for Healthcare Providers:  ?IncredibleEmployment.be ? ?This test is no t yet approved or cleared by the [place] FDA and  ?has been authorized for detection and/or diagnosis of SARS-CoV-2 by ?FDA under an Emergency Use Authorization (EUA). This EUA will remain  ?in effect (meaning this test can be used) for the duration of the ?COVID-19 declaration under Section 564(b)(1) of the Act, 21 ?U.S.C.section 360bbb-3(b)(1), unless the authorization is terminated  ?or revoked sooner.  ? ? ?  ? Influenza A by PCR NEGATIVE NEGATIVE Final  ? Influenza B by PCR NEGATIVE NEGATIVE Final  ?  Comment: (NOTE) ?The Xpert Xpress SARS-CoV-2/FLU/RSV plus assay is intended as an aid ?in the diagnosis of influenza from Nasopharyngeal swab specimens and ?should not be used as a sole basis for treatment. Nasal washings and ?aspirates are unacceptable for Xpert Xpress SARS-CoV-2/FLU/RSV ?testing. ? ?Fact Sheet for Patients: ?EntrepreneurPulse.com.au ? ?Fact Sheet for Healthcare Providers: ?IncredibleEmployment.be ? ?This test is not yet approved or cleared by the [place] FDA and ?has been authorized for detection and/or diagnosis of SARS-CoV-2 by ?FDA under an Emergency Use Authorization (EUA). This EUA will remain ?  in effect (meaning this test can be used) for the duration of the ?COVID-19 declaration under Section 564(b)(1) of the Act, 21 U.S.C. ?section 360bbb-3(b)(1), unless the authorization is terminated or ?revoked. ? ?Performed at [facility], [address] ?[place], [place] [address] ?  ?  ? ? ? ? ? ?Radiology Studies: ?CT PELVIS WO CONTRAST ? ?Result Date: 09/07/2021 ?CLINICAL DATA:  Pelvic fracture. EXAM:  CT PELVIS WITHOUT CONTRAST TECHNIQUE: Multidetector CT imaging of the pelvis was performed following the standard protocol without intravenous contrast. RADIATION DOSE REDUCTION: This exam was performe

## 2021-09-08 ENCOUNTER — Ambulatory Visit (HOSPITAL_COMMUNITY): Admission: RE | Admit: 2021-09-08 | Payer: Medicare Other | Source: Home / Self Care | Admitting: Orthopaedic Surgery

## 2021-09-08 ENCOUNTER — Encounter (HOSPITAL_COMMUNITY): Admission: RE | Payer: Self-pay | Source: Home / Self Care

## 2021-09-08 DIAGNOSIS — R52 Pain, unspecified: Secondary | ICD-10-CM

## 2021-09-08 DIAGNOSIS — M25552 Pain in left hip: Secondary | ICD-10-CM | POA: Diagnosis not present

## 2021-09-08 DIAGNOSIS — R531 Weakness: Secondary | ICD-10-CM

## 2021-09-08 DIAGNOSIS — I480 Paroxysmal atrial fibrillation: Secondary | ICD-10-CM | POA: Diagnosis not present

## 2021-09-08 DIAGNOSIS — I10 Essential (primary) hypertension: Secondary | ICD-10-CM | POA: Diagnosis not present

## 2021-09-08 LAB — BASIC METABOLIC PANEL
Anion gap: 8 (ref 5–15)
BUN: 30 mg/dL — ABNORMAL HIGH (ref 8–23)
CO2: 23 mmol/L (ref 22–32)
Calcium: 8.5 mg/dL — ABNORMAL LOW (ref 8.9–10.3)
Chloride: 107 mmol/L (ref 98–111)
Creatinine, Ser: 1.05 mg/dL — ABNORMAL HIGH (ref 0.44–1.00)
GFR, Estimated: 55 mL/min — ABNORMAL LOW (ref >60)
Glucose, Bld: 123 mg/dL — ABNORMAL HIGH (ref 70–99)
Potassium: 3.9 mmol/L (ref 3.5–5.1)
Sodium: 138 mmol/L (ref 135–145)

## 2021-09-08 LAB — PROTIME-INR
INR: 3.2 — ABNORMAL HIGH (ref 0.8–1.2)
Prothrombin Time: 32.7 seconds — ABNORMAL HIGH (ref 11.4–15.2)

## 2021-09-08 LAB — CBC
HCT: 29.6 % — ABNORMAL LOW (ref 36.0–46.0)
Hemoglobin: 9.5 g/dL — ABNORMAL LOW (ref 12.0–15.0)
MCH: 30.6 pg (ref 26.0–34.0)
MCHC: 32.1 g/dL (ref 30.0–36.0)
MCV: 95.5 fL (ref 80.0–100.0)
Platelets: 123 10*3/uL — ABNORMAL LOW (ref 150–400)
RBC: 3.1 MIL/uL — ABNORMAL LOW (ref 3.87–5.11)
RDW: 13.7 % (ref 11.5–15.5)
WBC: 6 10*3/uL (ref 4.0–10.5)
nRBC: 0 % (ref 0.0–0.2)

## 2021-09-08 LAB — MAGNESIUM: Magnesium: 2.3 mg/dL (ref 1.7–2.4)

## 2021-09-08 SURGERY — ARTHROPLASTY, KNEE, TOTAL
Anesthesia: Spinal | Site: Knee | Laterality: Right

## 2021-09-08 NOTE — Progress Notes (Signed)
Subjective: ?  patient is currently in the hospital due to her recent fall.  She is under the care of the medical team.  We saw her today to evaluate her left hip and shoulder.  She had a fall at home.  She ended up having x-rays which were negative and a CT scan of the pelvis which showed a small 4 cm hematoma.she has been unable to walk and weight-bear due to pain.  It was recommended that she go to a skilled nursing facility for further management.  She continues on her Coumadin. ? ?Objective: ?Vital signs in last 24 hours: ?Temp:  [98.4 ?F (36.9 ?C)] 98.4 ?F (36.9 ?C) (03/21 0550) ?Pulse Rate:  [93-98] 98 (03/21 0550) ?Resp:  [18-20] 20 (03/21 0550) ?BP: (111-126)/(47-56) 117/56 (03/21 0550) ?SpO2:  [93 %-96 %] 96 % (03/21 0550) ? ?Labs: ?Recent Labs  ?  09/06/21 ?1800 09/07/21 ?0254 09/08/21 ?2706  ?HGB 11.2* 10.1* 9.5*  ? ?Recent Labs  ?  09/07/21 ?2376 09/08/21 ?2831  ?WBC 4.9 6.0  ?RBC 3.26* 3.10*  ?HCT 31.3* 29.6*  ?PLT 141* 123*  ? ?Recent Labs  ?  09/07/21 ?5176 09/08/21 ?1607  ?NA 139 138  ?K 4.0 3.9  ?CL 108 107  ?CO2 24 23  ?BUN 25* 30*  ?CREATININE 0.89 1.05*  ?GLUCOSE 124* 123*  ?CALCIUM 8.8* 8.5*  ? ?Recent Labs  ?  09/07/21 ?3710 09/08/21 ?6269  ?INR 2.8* 3.2*  ? ? ?Physical Exam: ? Neurologically intact ?ABD soft ?Neurovascular intact ?Sensation intact distally ?Intact pulses distally ?No cellulitis present ?Compartment soft ?Left hip range of motion passively is full and without pain.  She has some tenderness to palpation laterally in the posterior aspect of her hip.  Examination left shoulder shows limited active range of motion.  Passively her range of motion is fairly good below the horizon.  She has diffuse tenderness palpation to the shoulder.  Skin is benign.  She is neurovascularly intact distally. ? ?Assessment/Plan: ?Sierra [name] has some bad contusions.  At this point there is nothing further to do.  There is a chance that her rotator cuff may be torn but we really would not do anything about  this at this time.  We will just see her back in the clinic on an outpatient basis for further management of both of these issues.  We greatly appreciate medical management.  Please feel free to call us with any questions or concerns. ?   ? ?Larwance Sachs Guynell Kleiber ?09/08/2021, 12:05 PM ? ?

## 2021-09-08 NOTE — Evaluation (Addendum)
Occupational Therapy Evaluation Patient Details Name: Sierra [name] MRN: [id number] DOB: 11-May-1946 Today's Date: 09/08/2021   History of Present Illness 76 y.o. female with history of A-fib, diastolic CHF, hypertension, bilateral THA, Bilat. knee DJD presented with a mechanical fall and left shoulder and left hip pain.  On presentation x-rays of the left shoulder and hip did not show any fracture.  CT of the left pelvis did not show any fracture or hardware loosening but showed possible hematoma. was recently scheduled for RTKA 3/21 but cancelled due to spouse illness.   Clinical Impression   Patient is a motivated 76 year old female who was noted to have increased pain impacting participation in all tasks. Patient was max A for supine to sit on edge of bed with TD for LB dressing and standing attempts. Patient was noted to have decreased functional activity tolerance and decreased endurance impacting ability to engage in ADLs. Patient noted to have increased pain in LUE with inability to tolerate any movement during this session. Patient would continue to benefit from skilled OT services at this time while admitted and after d/c to address noted deficits in order to improve overall safety and independence in ADLs.        Recommendations for follow up therapy are one component of a multi-disciplinary discharge planning process, led by the attending physician.  Recommendations may be updated based on patient status, additional functional criteria and insurance authorization.   Follow Up Recommendations  Skilled nursing-short term rehab (<3 hours/day)    Assistance Recommended at Discharge Frequent or constant Supervision/Assistance  Patient can return home with the following Two people to help with walking and/or transfers;Two people to help with bathing/dressing/bathroom;Direct supervision/assist for medications management;Help with stairs or ramp for entrance;Assist for transportation;Direct  supervision/assist for financial management;Assistance with cooking/housework    Functional Status Assessment  Patient has had a recent decline in their functional status and demonstrates the ability to make significant improvements in function in a reasonable and predictable amount of time.  Equipment Recommendations  Other (comment) (defer to next venue)    Recommendations for Other Services       Precautions / Restrictions Precautions Precautions: Fall Precaution Comments: L shoulder painful to touch and move, L side trunk shingles Restrictions Weight Bearing Restrictions: No      Mobility Bed Mobility Overal bed mobility: Needs Assistance Bed Mobility: Supine to Sit   Sidelying to sit: Mod assist, HOB elevated       General bed mobility comments: patient required increased time with ability to move RLE and RUE to assist with supine to sit on edge of bed patient was tearful at times with attempt to move LUE and with physical assist needed to move LLE.    Transfers                          Balance Overall balance assessment: Needs assistance, History of Falls Sitting-balance support: Feet supported, Single extremity supported Sitting balance-Leahy Scale: Fair Sitting balance - Comments: patient was able to sit on edge of bed with min guard no leaning noted on this date.   Standing balance support: Reliant on assistive device for balance, Bilateral upper extremity supported, During functional activity Standing balance-Leahy Scale: Poor                             ADL either performed or assessed with clinical judgement   ADL  Overall ADL's : Needs assistance/impaired Eating/Feeding: Set up;Sitting   Grooming: Oral care;Wash/dry face;Set up;Sitting Grooming Details (indicate cue type and reason): EOB Upper Body Bathing: Sitting;Moderate assistance   Lower Body Bathing: Maximal assistance;Sit to/from stand;Sitting/lateral leans   Upper Body  Dressing : Moderate assistance;Sitting   Lower Body Dressing: Maximal assistance;Sit to/from stand;Sitting/lateral leans   Toilet Transfer: +2 for physical assistance;+2 for safety/equipment Toilet Transfer Details (indicate cue type and reason): patient was able to stand at edge of bed with max A and increased time to participate in small side steps to head of bed. patient was unable to clear BLE with scooting of feet noted with RW. patient noted to have increased pain with WB on RLE with reports of arthritis. patient unable to bring LUE onto walker until standing with increased time and leaning of body to get hand in correct spot. Toileting- Clothing Manipulation and Hygiene: Total assistance;Bed level       Functional mobility during ADLs: +2 for safety/equipment;+2 for physical assistance       Vision Baseline Vision/History: 1 Wears glasses Ability to See in Adequate Light: 1 Impaired Patient Visual Report: No change from baseline       Perception     Praxis      Pertinent Vitals/Pain Pain Assessment Pain Assessment: Faces Faces Pain Scale: Hurts whole lot Pain Location: left shoulder when  moved or touched on back of shoulder, Left hip when moving Pain Descriptors / Indicators: Discomfort, Sharp, Moaning, Guarding, Grimacing Pain Intervention(s): Limited activity within patient's tolerance, Monitored during session, Premedicated before session, Repositioned     Hand Dominance Right   Extremity/Trunk Assessment Upper Extremity Assessment Upper Extremity Assessment: LUE deficits/detail LUE Deficits / Details: unable to participate in shoulder flexion. able to particiapte in few degrees of shoulder external rotation with noted increase in pain. patient able to participate in AAROM of abduction aobout 10 degrees with increased pain noted. elbow AROM WFL. hand and wrist WFL. no MMT due to pain LUE: Unable to fully assess due to pain   Lower Extremity Assessment Lower  Extremity Assessment: Defer to PT evaluation   Cervical / Trunk Assessment Cervical / Trunk Assessment: Other exceptions Cervical / Trunk Exceptions: limited neck roeation with pain   Communication Communication Communication: No difficulties   Cognition Arousal/Alertness: Awake/alert Behavior During Therapy: WFL for tasks assessed/performed, Anxious Overall Cognitive Status: Within Functional Limits for tasks assessed                                       General Comments       Exercises     Shoulder Instructions      Home Living Family/patient expects to be discharged to:: Private residence Living Arrangements: Spouse/significant other Available Help at Discharge: Available 24 hours/day;Family Type of Home: House Home Access: Stairs to enter [employer] of Steps: 4 Entrance Stairs-Rails: Right;Left Home Layout: One level     Bathroom Shower/Tub: Producer, television/film/video: Handicapped height     Home Equipment: Cane - single point;Shower seat - built in          Prior Functioning/Environment Prior Level of Function : Independent/Modified Independent             Mobility Comments: drives          OT Problem List: Decreased strength;Decreased range of motion;Decreased activity tolerance;Impaired balance (sitting and/or standing);Decreased safety awareness;Impaired UE functional  use;Decreased knowledge of precautions;Decreased knowledge of use of DME or AE;Pain      OT Treatment/Interventions: Self-care/ADL training;Therapeutic exercise;Neuromuscular education;Energy conservation;DME and/or AE instruction;Therapeutic activities;Balance training;Patient/family education    OT Goals(Current goals can be found in the care plan section) Acute Rehab OT Goals Patient Stated Goal: to get pain under control OT Goal Formulation: With patient Time For Goal Achievement: 09/22/21 Potential to Achieve Goals: Good  OT Frequency: Min  2X/week    Co-evaluation              AM-PAC OT "6 Clicks" Daily Activity     Outcome Measure Help from another person eating meals?: A Little Help from another person taking care of personal grooming?: A Little Help from another person toileting, which includes using toliet, bedpan, or urinal?: Total Help from another person bathing (including washing, rinsing, drying)?: A Lot Help from another person to put on and taking off regular upper body clothing?: A Lot Help from another person to put on and taking off regular lower body clothing?: A Lot 6 Click Score: 13   End of Session Equipment Utilized During Treatment: Rolling walker (2 wheels) Nurse Communication: Other (comment) (ok to see patient and updated on participation in session)  Activity Tolerance: Patient tolerated treatment well Patient left: in bed;with call bell/phone within reach;with bed alarm set  OT Visit Diagnosis: Unsteadiness on feet (R26.81);Pain;Muscle weakness (generalized) (M62.81);History of falling (Z91.81) Pain - Right/Left: Left Pain - part of body: Shoulder                Time: 1610-9604 OT Time Calculation (min): 32 min Charges:  OT General Charges $OT Visit: 1 Visit OT Evaluation $OT Eval Moderate Complexity: 1 Mod OT Treatments $Self Care/Home Management : 8-22 mins  Sharyn Blitz OTR/L, MS Acute Rehabilitation Department Office# [phone] Pager# [phone]   Ardyth Harps 09/08/2021, 4:46 PM

## 2021-09-08 NOTE — Progress Notes (Signed)
?PROGRESS NOTE ? ? ? ?Sierra [name]  LZJ:[id number] DOB: October 11, 1945 DOA: 09/06/2021 ?PCP: Lavone Orn, MD  ? ?Brief Narrative:  ?76 y.o. female with history of A-fib, diastolic CHF, hypertension presented with a mechanical fall and left shoulder and left hip pain.  On presentation x-rays of the left shoulder and hip did not show any fracture.  CT of the left pelvis did not show any fracture or hardware loosening but showed possible hematoma. ? ?Assessment & Plan: ?  ?Mechanical fall with severe left shoulder and hip pain ?Possible left hip hematoma ?-X-rays did not show any fracture.  CT of the left pelvis did not show any fracture or hardware loosening but showed possible hematoma. ?-Still complains of significant pain.  Continue pain management.  PT recommends SNF placement.  Social worker following.  Social worker following. ?-I have notified orthopedics/Dr. Rhona Raider.  Follow recommendations. ? ?Possible shingles over the right flank area ?-Continue Valtrex for a week.  Continue isolation. ? ?Paroxysmal A-fib ?-Currently rate controlled.  Continue metoprolol.  Coumadin to be dosed by pharmacy.  Monitor INR.  INR 3.2 today.  Outpatient follow-up with cardiology. ? ?Chronic diastolic CHF ?-Currently compensated.  Continue metoprolol, torsemide.  Strict input and output.  Daily weights.  Fluid restriction. ? ?Hypertension ?-Monitor blood pressure.  Continue amlodipine, torsemide and beta-blockers ? ?Anemia of chronic disease ?-Possibly from chronic illnesses ?-Hemoglobin stable.  Monitor intermittently ? ?Thrombocytopenia ?-Questionable cause.  No signs of bleeding.  Monitor ? ?Hypokalemia ?-resolved ? ?Right knee arthritis ?-Was supposed to have surgery as an outpatient soon but was canceled ? ? ?DVT prophylaxis: Coumadin ?Code Status: Full ?Family Communication: None at bedside ?Disposition Plan: ?Status is: inpatient because: Severity of pain.  Needs SNF placement ? ? ?Consultants: Orthopedics ? ?Procedures:  None ? ?Antimicrobials: None ? ? ?Subjective: ?Patient seen and examined at bedside.  Complains of left hip and shoulder pain.  No fever, worsening shortness of breath or chest pain reported. ?Objective: ?Vitals:  ? 09/07/21 0944 09/07/21 1333 09/07/21 2129 09/08/21 0550  ?BP: (!) 124/51 (!) 126/55 (!) 111/47 (!) 117/56  ?Pulse:  93 97 98  ?Resp:  18  20  ?Temp:    98.4 ?F (36.9 ?C)  ?TempSrc:      ?SpO2:  94% 93% 96%  ?Weight:      ?Height:      ? ? ?Intake/Output Summary (Last 24 hours) at 09/08/2021 0240 ?Last data filed at 09/07/2021 2200 ?Gross per 24 hour  ?Intake 250 ml  ?Output 250 ml  ?Net 0 ml  ? ?Filed Weights  ? 09/06/21 1220  ?Weight: 79.8 kg  ? ? ?Examination: ? ?General exam: On room air currently.  No distress.  Elderly female lying in bed. ?Respiratory system: Decreased breath sounds at bases bilaterally with some scattered crackles ?Cardiovascular system: Currently rate controlled; S1-S2 heard  ?gastrointestinal system: Abdomen is distended slightly; soft and nontender.  Bowel sounds are heard  ?extremities: Trace lower extremity edema present; no clubbing.  Left shoulder and left hip tenderness present ?Central nervous system: Awake and alert.  No focal neurological deficits.  Moves extremities skin: Dermatomal rash on the right flank.  No other petechiae/rashes  ?psychiatry: Mood, affect and judgment are normal ? ? ? ?Data Reviewed: I have personally reviewed following labs and imaging studies ? ?CBC: ?Recent Labs  ?Lab 09/06/21 ?1800 09/07/21 ?9735 09/08/21 ?3299  ?WBC 5.7 4.9 6.0  ?NEUTROABS 4.5  --   --   ?HGB 11.2* 10.1* 9.5*  ?HCT 34.0*  31.3* 29.6*  ?MCV 94.2 96.0 95.5  ?PLT 144* 141* 123*  ? ? ?Basic Metabolic Panel: ?Recent Labs  ?Lab 09/06/21 ?1800 09/07/21 ?4196 09/08/21 ?2229  ?NA 138 139 138  ?K 3.3* 4.0 3.9  ?CL 108 108 107  ?CO2 '22 24 23  '$ ?GLUCOSE 131* 124* 123*  ?BUN 29* 25* 30*  ?CREATININE 0.90 0.89 1.05*  ?CALCIUM 8.7* 8.8* 8.5*  ?MG  --   --  2.3  ? ? ?GFR: ?Estimated Creatinine  Clearance: 47.8 mL/min (A) (by C-G formula based on SCr of 1.05 mg/dL (H)). ?Liver Function Tests: ?No results for input(s): AST, ALT, ALKPHOS, BILITOT, PROT, ALBUMIN in the last 168 hours. ?No results for input(s): LIPASE, AMYLASE in the last 168 hours. ?No results for input(s): AMMONIA in the last 168 hours. ?Coagulation Profile: ?Recent Labs  ?Lab 09/06/21 ?1806 09/07/21 ?7989 09/08/21 ?2119  ?INR 2.8* 2.8* 3.2*  ? ? ?Cardiac Enzymes: ?No results for input(s): CKTOTAL, CKMB, CKMBINDEX, TROPONINI in the last 168 hours. ?BNP (last 3 results) ?No results for input(s): PROBNP in the last 8760 hours. ?HbA1C: ?No results for input(s): HGBA1C in the last 72 hours. ?CBG: ?No results for input(s): GLUCAP in the last 168 hours. ?Lipid Profile: ?No results for input(s): CHOL, HDL, LDLCALC, TRIG, CHOLHDL, LDLDIRECT in the last 72 hours. ?Thyroid Function Tests: ?No results for input(s): TSH, T4TOTAL, FREET4, T3FREE, THYROIDAB in the last 72 hours. ?Anemia Panel: ?No results for input(s): VITAMINB12, FOLATE, FERRITIN, TIBC, IRON, RETICCTPCT in the last 72 hours. ?Sepsis Labs: ?No results for input(s): PROCALCITON, LATICACIDVEN in the last 168 hours. ? ?Recent Results (from the past 240 hour(s))  ?Resp Panel by RT-PCR (Flu A&B, Covid) Nasopharyngeal Swab     Status: None  ? Collection Time: 09/06/21  5:45 PM  ? Specimen: Nasopharyngeal Swab; Nasopharyngeal(NP) swabs in vial transport medium  ?Result Value Ref Range Status  ? SARS Coronavirus 2 by RT PCR NEGATIVE NEGATIVE Final  ?  Comment: (NOTE) ?SARS-CoV-2 target nucleic acids are NOT DETECTED. ? ?The SARS-CoV-2 RNA is generally detectable in upper respiratory ?specimens during the acute phase of infection. The lowest ?concentration of SARS-CoV-2 viral copies this assay can detect is ?138 copies/mL. A negative result does not preclude SARS-Cov-2 ?infection and should not be used as the sole basis for treatment or ?other patient management decisions. A negative result may  occur with  ?improper specimen collection/handling, submission of specimen other ?than nasopharyngeal swab, presence of viral mutation(s) within the ?areas targeted by this assay, and inadequate number of viral ?copies(<138 copies/mL). A negative result must be combined with ?clinical observations, patient history, and epidemiological ?information. The expected result is Negative. ? ?Fact Sheet for Patients:  ?EntrepreneurPulse.com.au ? ?Fact Sheet for Healthcare Providers:  ?IncredibleEmployment.be ? ?This test is no t yet approved or cleared by the [place] FDA and  ?has been authorized for detection and/or diagnosis of SARS-CoV-2 by ?FDA under an Emergency Use Authorization (EUA). This EUA will remain  ?in effect (meaning this test can be used) for the duration of the ?COVID-19 declaration under Section 564(b)(1) of the Act, 21 ?U.S.C.section 360bbb-3(b)(1), unless the authorization is terminated  ?or revoked sooner.  ? ? ?  ? Influenza A by PCR NEGATIVE NEGATIVE Final  ? Influenza B by PCR NEGATIVE NEGATIVE Final  ?  Comment: (NOTE) ?The Xpert Xpress SARS-CoV-2/FLU/RSV plus assay is intended as an aid ?in the diagnosis of influenza from Nasopharyngeal swab specimens and ?should not be used as a sole basis for treatment. Nasal  washings and ?aspirates are unacceptable for Xpert Xpress SARS-CoV-2/FLU/RSV ?testing. ? ?Fact Sheet for Patients: ?EntrepreneurPulse.com.au ? ?Fact Sheet for Healthcare Providers: ?IncredibleEmployment.be ? ?This test is not yet approved or cleared by the [place] FDA and ?has been authorized for detection and/or diagnosis of SARS-CoV-2 by ?FDA under an Emergency Use Authorization (EUA). This EUA will remain ?in effect (meaning this test can be used) for the duration of the ?COVID-19 declaration under Section 564(b)(1) of the Act, 21 U.S.C. ?section 360bbb-3(b)(1), unless the authorization is terminated  or ?revoked. ? ?Performed at [facility], [address] ?[place], [place] [address] ?  ? ?  ? ? ? ? ? ?Radiology Studies: ?CT PELVIS WO CONTRAST ? ?Result Date: 09/07/2021 ?CLINICAL DATA:  Pelv

## 2021-09-08 NOTE — TOC Progression Note (Addendum)
Transition of Care (TOC) - Progression Note  ? ? ?Patient Details  ?Name: Sierra [name] ?MRN: [id number] ?Date of Birth: 1945-07-18 ? ?Transition of Care (TOC) CM/SW Contact  ?[place], LCSW ?Phone Number: ?09/08/2021, 1:27 PM ? ?Clinical Narrative:    ? ?CSW provided pt with SNF bed offers. She will discuss further with husband regarding facility choice. CSW provided his phone number to contact with SNF choice. ? ?1600: CSW spoke with pt and pt spouse on speaker phone regarding bed choice; they choose [facility]. CSW contacted Nikki at [facility] and confirmed bed offer.  ? ?Expected Discharge Plan: [place] ?Barriers to Discharge: Continued Medical Work up ? ?Expected Discharge Plan and Services ?Expected Discharge Plan: [place] ?  ?  ?  ?Living arrangements for the past 2 months: [place] ?                ?  ?  ?  ?  ?  ?  ?  ?  ?  ?  ? ? ?Social Determinants of Health (SDOH) Interventions ?  ? ?Readmission Risk Interventions ?No flowsheet data found. ? ?

## 2021-09-08 NOTE — Progress Notes (Signed)
ANTICOAGULATION CONSULT NOTE - Initial Consult ? ?Pharmacy Consult for warfarin ?Indication: atrial fibrillation ? ?Allergies  ?Allergen Reactions  ? Ace Inhibitors Cough  ? Codeine Nausea Only  ? Erythromycin Other (See Comments)  ?  Stomach pain ?  ? Iohexol Hives  ?   Code: HIVES, Desc: patient states develops hives w/ iv contrast/mms ?  ? Iodinated Contrast Media Other (See Comments)  ?  Hives  ? ? ?Patient Measurements: ?Height: '5\' 6"'$  (167.6 cm) ?Weight: 79.8 kg (176 lb) ?IBW/kg (Calculated) : 59.3 ? ?Vital Signs: ?Temp: 98.4 ?F (36.9 ?C) (03/21 0550) ?BP: 117/56 (03/21 0550) ?Pulse Rate: 98 (03/21 0550) ? ?Labs: ?Recent Labs  ?  09/06/21 ?1800 09/06/21 ?1806 09/07/21 ?3532 09/08/21 ?9924  ?HGB 11.2*  --  10.1* 9.5*  ?HCT 34.0*  --  31.3* 29.6*  ?PLT 144*  --  141* 123*  ?LABPROT  --  29.4* 29.7* 32.7*  ?INR  --  2.8* 2.8* 3.2*  ?CREATININE 0.90  --  0.89 1.05*  ? ? ? ?Estimated Creatinine Clearance: 47.8 mL/min (A) (by C-G formula based on SCr of 1.05 mg/dL (H)). ? ? ?Medical History: ?Past Medical History:  ?Diagnosis Date  ? Anxiety   ? Arthritis   ? "joints" (09/19/2013)  ? Atrial fibrillation ([place])   ? 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 CHF (congestive heart failure) ([place])   ? Chronic lower back pain   ? Dysrhythmia   ? GERD (gastroesophageal reflux disease)   ? H/O hiatal hernia   ? Hypertension   ? Migraine   ? "haven't had any in a long time" (09/19/2013)  ? Pneumonia ~ 2012  ? ? ?Medications:  ?Medications Prior to Admission  ?Medication Sig Dispense Refill Last Dose  ? acetaminophen (TYLENOL) 650 MG CR tablet Take 1,300 mg by mouth in the morning and at bedtime.   09/06/2021  ? amLODipine (NORVASC) 5 MG tablet Take 5 mg by mouth daily.   09/06/2021  ? Cholecalciferol (VITAMIN D-3) 5000 UNITS TABS Take 5,000 Units by mouth every Monday, Wednesday, and Friday.    09/04/2021  ? citalopram (CELEXA) 20 MG tablet Take 20 mg by mouth daily.   09/06/2021  ? Coenzyme Q10 (CO Q-10) 100 MG CAPS Take 100 mg by  mouth daily.   09/06/2021  ? colestipol (COLESTID) 5 g packet Take 5 g by mouth 2 (two) times daily.   3 09/06/2021  ? fexofenadine (ALLEGRA) 180 MG tablet Take 180 mg by mouth daily as needed for allergies.   09/05/2021  ? Glucosamine HCl 1000 MG TABS Take 1,000 mg by mouth daily.   09/05/2021  ? Magnesium 250 MG TABS Take 250 mg by mouth every Monday, Wednesday, and Friday.   09/04/2021  ? Menthol, Topical Analgesic, (BIOFREEZE) 4 % GEL Apply 1 application. topically daily as needed (pain).   09/05/2021  ? metolazone (ZAROXOLYN) 5 MG tablet Take 5 mg by mouth daily as needed (Takes is weight gets above 195).   0 Past Month  ? metoprolol succinate (TOPROL-XL) 50 MG 24 hr tablet TAKE 3 TABLETS BY MOUTH EVERY DAY (Patient taking differently: Take 150 mg by mouth at bedtime.) 270 tablet 2 09/05/2021 at 11 pm  ? Multiple Vitamins-Minerals (MEMORY VITE PO) Take 3 tablets by mouth daily. Memory plus   09/05/2021  ? Multiple Vitamins-Minerals (MULTIVITAMIN WITH MINERALS) tablet Take 1 tablet by mouth daily. 50+ for her   09/06/2021  ? Multiple Vitamins-Minerals (PRESERVISION AREDS 2) CAPS Take  1 capsule by mouth in the morning and at bedtime.   09/05/2021  ? potassium chloride (KLOR-CON) 10 MEQ tablet Take 10 mEq by mouth daily.   09/05/2021  ? RABEprazole (ACIPHEX) 20 MG tablet Take 20 mg by mouth daily.    09/06/2021  ? terazosin (HYTRIN) 10 MG capsule Take 10 mg by mouth at bedtime.   09/05/2021  ? torsemide (DEMADEX) 20 MG tablet Take 20 mg by mouth daily.   09/05/2021  ? valsartan (DIOVAN) 160 MG tablet Take 1 tablet (160 mg total) by mouth daily. 90 tablet 3 09/06/2021  ? warfarin (COUMADIN) 5 MG tablet TAKE AS DIRECTED BY COUMADIN CLINIC (Patient taking differently: Take 2.5-5 mg by mouth See admin instructions. Take as directed by coumadin clinic; 5 mg Mon and Fri, 2.5 mg all other days.) 80 tablet 1 09/06/2021 at 7 pm  ? ?Scheduled:  ? amLODipine  5 mg Oral Daily  ? citalopram  20 mg Oral Daily  ? colestipol  5 g Oral BID  ?  irbesartan  150 mg Oral Daily  ? magnesium oxide  200 mg Oral Q M,W,F  ? metoprolol succinate  150 mg Oral QHS  ? pantoprazole  40 mg Oral Daily  ? potassium chloride  10 mEq Oral Daily  ? potassium chloride  20 mEq Oral Once  ? senna-docusate  1 tablet Oral BID  ? terazosin  10 mg Oral QHS  ? torsemide  20 mg Oral Daily  ? valACYclovir  1,000 mg Oral TID  ? Warfarin - Pharmacist Dosing Inpatient   Does not apply [id number]  ? ?PRN:  ? ?Assessment: ?33 yoF with PMH PAF on warfarin PTA, CHF, HTN, admitted 3/19 with shoulder & hip pain after fall. Patient was reportedly on her way to clinic to be seen for possible new shingles rash. X-rays negative for fracture, CT of pelvis showed possible hematoma.  No ortho consult or mention of ortho procedures at this point, and Pharmacy to dose warfarin. ? ?Baseline INR therapeutic ?Prior anticoagulation: warfarin 5 mg on Mon, Fri; 2.5 mg all other days; LD 3/19 (took dose in ED) - patient has been maintained on this dosing for several months per anticoag clinic notes ? ?Significant events: ?3.21: Ortho consulted and are not planning any surgical interventions. ? ?Today, 09/08/2021: ?CBC: Hgb 11.2 >> 9.5; platelets 144 >> 123 ?INR slightly subtherapeutic at 3.2 (large increase from 2.8 to 3.2) ?Drug interactions:  ?Citalopram may increase risk of bleeding, however patient has been maintained on this regimen PTA. ?Torsemide may increase warfarin concentrations, however patient has been maintained on this regimen PTA. ?D/w ortho and TRH- no signs of bleeding reported ?Diet ordered ?Scr slight bump 0.98 > 1.05 ? ?Goal of Therapy: ?INR 2-3 ? ?Plan: ?Hold warfarin this evening ?Daily INR ?CBC at least q72 hr while on warfarin ?Monitor for signs of bleeding or thrombosis ? ?Dimple Nanas, PharmD ?09/08/2021 11:21 AM ? ?

## 2021-09-09 DIAGNOSIS — I5032 Chronic diastolic (congestive) heart failure: Secondary | ICD-10-CM | POA: Diagnosis not present

## 2021-09-09 DIAGNOSIS — M25552 Pain in left hip: Secondary | ICD-10-CM | POA: Diagnosis not present

## 2021-09-09 DIAGNOSIS — R52 Pain, unspecified: Secondary | ICD-10-CM | POA: Diagnosis not present

## 2021-09-09 DIAGNOSIS — I10 Essential (primary) hypertension: Secondary | ICD-10-CM | POA: Diagnosis not present

## 2021-09-09 LAB — CBC
HCT: 23.2 % — ABNORMAL LOW (ref 36.0–46.0)
Hemoglobin: 7.9 g/dL — ABNORMAL LOW (ref 12.0–15.0)
MCH: 31.5 pg (ref 26.0–34.0)
MCHC: 34.1 g/dL (ref 30.0–36.0)
MCV: 92.4 fL (ref 80.0–100.0)
Platelets: 137 10*3/uL — ABNORMAL LOW (ref 150–400)
RBC: 2.51 MIL/uL — ABNORMAL LOW (ref 3.87–5.11)
RDW: 13.5 % (ref 11.5–15.5)
WBC: 6.2 10*3/uL (ref 4.0–10.5)
nRBC: 0 % (ref 0.0–0.2)

## 2021-09-09 LAB — PROTIME-INR
INR: 3.4 — ABNORMAL HIGH (ref 0.8–1.2)
Prothrombin Time: 34.1 seconds — ABNORMAL HIGH (ref 11.4–15.2)

## 2021-09-09 MED ORDER — METOPROLOL TARTRATE 5 MG/5ML IV SOLN
2.5000 mg | Freq: Once | INTRAVENOUS | Status: AC | PRN
  Administered 2021-09-09: 2.5 mg via INTRAVENOUS
  Filled 2021-09-09: qty 5

## 2021-09-09 MED ORDER — METHYLPREDNISOLONE SODIUM SUCC 40 MG IJ SOLR
40.0000 mg | Freq: Every day | INTRAMUSCULAR | Status: DC
  Administered 2021-09-09 – 2021-09-10 (×2): 40 mg via INTRAVENOUS
  Filled 2021-09-09 (×2): qty 1

## 2021-09-09 MED ORDER — METOPROLOL TARTRATE 25 MG PO TABS
25.0000 mg | ORAL_TABLET | Freq: Once | ORAL | Status: AC
  Administered 2021-09-09: 25 mg via ORAL
  Filled 2021-09-09: qty 1

## 2021-09-09 MED ORDER — METOPROLOL TARTRATE 5 MG/5ML IV SOLN: 2.5000 mg | Freq: Once | INTRAVENOUS | Status: DC | PRN

## 2021-09-09 NOTE — Progress Notes (Signed)
ANTICOAGULATION CONSULT NOTE - Initial Consult ? ?Pharmacy Consult for warfarin ?Indication: atrial fibrillation ? ?Allergies  ?Allergen Reactions  ? Ace Inhibitors Cough  ? Codeine Nausea Only  ? Erythromycin Other (See Comments)  ?  Stomach pain ?  ? Iohexol Hives  ?   Code: HIVES, Desc: patient states develops hives w/ iv contrast/mms ?  ? Iodinated Contrast Media Other (See Comments)  ?  Hives  ? ? ?Patient Measurements: ?Height: '5\' 6"'$  (167.6 cm) ?Weight: 79.8 kg (176 lb) ?IBW/kg (Calculated) : 59.3 ? ?Vital Signs: ?Temp: 98.4 ?F (36.9 ?C) (03/22 0522) ?Temp Source: Oral (03/21 2045) ?BP: 118/46 (03/22 0522) ?Pulse Rate: 102 (03/22 0522) ? ?Labs: ?Recent Labs  ?  09/06/21 ?1800 09/06/21 ?1806 09/07/21 ?9678 09/08/21 ?9381 09/09/21 ?0411  ?HGB 11.2*  --  10.1* 9.5* 7.9*  ?HCT 34.0*  --  31.3* 29.6* 23.2*  ?PLT 144*  --  141* 123* 137*  ?LABPROT  --    < > 29.7* 32.7* 34.1*  ?INR  --    < > 2.8* 3.2* 3.4*  ?CREATININE 0.90  --  0.89 1.05*  --   ? < > = values in this interval not displayed.  ? ? ? ?Estimated Creatinine Clearance: 47.8 mL/min (A) (by C-G formula based on SCr of 1.05 mg/dL (H)). ? ? ?Medical History: ?Past Medical History:  ?Diagnosis Date  ? Anxiety   ? Arthritis   ? "joints" (09/19/2013)  ? Atrial fibrillation ([place])   ? 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 CHF (congestive heart failure) ([place])   ? Chronic lower back pain   ? Dysrhythmia   ? GERD (gastroesophageal reflux disease)   ? H/O hiatal hernia   ? Hypertension   ? Migraine   ? "haven't had any in a long time" (09/19/2013)  ? Pneumonia ~ 2012  ? ? ?Medications:  ?Medications Prior to Admission  ?Medication Sig Dispense Refill Last Dose  ? acetaminophen (TYLENOL) 650 MG CR tablet Take 1,300 mg by mouth in the morning and at bedtime.   09/06/2021  ? amLODipine (NORVASC) 5 MG tablet Take 5 mg by mouth daily.   09/06/2021  ? Cholecalciferol (VITAMIN D-3) 5000 UNITS TABS Take 5,000 Units by mouth every Monday, Wednesday, and Friday.    09/04/2021  ?  citalopram (CELEXA) 20 MG tablet Take 20 mg by mouth daily.   09/06/2021  ? Coenzyme Q10 (CO Q-10) 100 MG CAPS Take 100 mg by mouth daily.   09/06/2021  ? colestipol (COLESTID) 5 g packet Take 5 g by mouth 2 (two) times daily.   3 09/06/2021  ? fexofenadine (ALLEGRA) 180 MG tablet Take 180 mg by mouth daily as needed for allergies.   09/05/2021  ? Glucosamine HCl 1000 MG TABS Take 1,000 mg by mouth daily.   09/05/2021  ? Magnesium 250 MG TABS Take 250 mg by mouth every Monday, Wednesday, and Friday.   09/04/2021  ? Menthol, Topical Analgesic, (BIOFREEZE) 4 % GEL Apply 1 application. topically daily as needed (pain).   09/05/2021  ? metolazone (ZAROXOLYN) 5 MG tablet Take 5 mg by mouth daily as needed (Takes is weight gets above 195).   0 Past Month  ? metoprolol succinate (TOPROL-XL) 50 MG 24 hr tablet TAKE 3 TABLETS BY MOUTH EVERY DAY (Patient taking differently: Take 150 mg by mouth at bedtime.) 270 tablet 2 09/05/2021 at 11 pm  ? Multiple Vitamins-Minerals (MEMORY VITE PO) Take 3 tablets by mouth daily. Memory plus  09/05/2021  ? Multiple Vitamins-Minerals (MULTIVITAMIN WITH MINERALS) tablet Take 1 tablet by mouth daily. 50+ for her   09/06/2021  ? Multiple Vitamins-Minerals (PRESERVISION AREDS 2) CAPS Take 1 capsule by mouth in the morning and at bedtime.   09/05/2021  ? potassium chloride (KLOR-CON) 10 MEQ tablet Take 10 mEq by mouth daily.   09/05/2021  ? RABEprazole (ACIPHEX) 20 MG tablet Take 20 mg by mouth daily.    09/06/2021  ? terazosin (HYTRIN) 10 MG capsule Take 10 mg by mouth at bedtime.   09/05/2021  ? torsemide (DEMADEX) 20 MG tablet Take 20 mg by mouth daily.   09/05/2021  ? valsartan (DIOVAN) 160 MG tablet Take 1 tablet (160 mg total) by mouth daily. 90 tablet 3 09/06/2021  ? warfarin (COUMADIN) 5 MG tablet TAKE AS DIRECTED BY COUMADIN CLINIC (Patient taking differently: Take 2.5-5 mg by mouth See admin instructions. Take as directed by coumadin clinic; 5 mg Mon and Fri, 2.5 mg all other days.) 80 tablet 1  09/06/2021 at 7 pm  ? ?Scheduled:  ? amLODipine  5 mg Oral Daily  ? citalopram  20 mg Oral Daily  ? colestipol  5 g Oral BID  ? irbesartan  150 mg Oral Daily  ? magnesium oxide  200 mg Oral Q M,W,F  ? metoprolol succinate  150 mg Oral QHS  ? pantoprazole  40 mg Oral Daily  ? potassium chloride  10 mEq Oral Daily  ? potassium chloride  20 mEq Oral Once  ? senna-docusate  1 tablet Oral BID  ? terazosin  10 mg Oral QHS  ? torsemide  20 mg Oral Daily  ? valACYclovir  1,000 mg Oral TID  ? Warfarin - Pharmacist Dosing Inpatient   Does not apply [id number]  ? ?PRN:  ? ?Assessment: ?60 yoF with PMH PAF on warfarin PTA, CHF, HTN, admitted 3/19 with shoulder & hip pain after fall. Patient was reportedly on her way to clinic to be seen for possible new shingles rash. X-rays negative for fracture, CT of pelvis showed possible hematoma.  No ortho consult or mention of ortho procedures at this point, and Pharmacy to dose warfarin. ? ?Baseline INR therapeutic ?Prior anticoagulation: warfarin 5 mg on Mon, Fri; 2.5 mg all other days; LD 3/19 (took dose in ED) - patient has been maintained on this dosing for several months per anticoag clinic notes ? ?Significant events: ?3.21: Ortho consulted and are not planning any surgical interventions. ? ?Today, 09/09/2021: ?CBC: Hgb decrease from 9.5 >> 7.9; platelets slightly low but appear stable ?INR is supratherapeutic today at 3.4 (2.8 >> 3.2 >> 3.4) ?Drug interactions:  ?Citalopram may increase risk of bleeding, however patient has been maintained on this regimen PTA. ?Torsemide may increase warfarin concentrations, however patient has been maintained on this regimen PTA. ?Diet ordered ?No signs of bleeding reported ? ?Goal of Therapy: ?INR 2-3 ? ?Plan: ?Hold warfarin this evening ?Daily INR ?CBC at least q72 hr while on warfarin ?Monitor for signs of bleeding or thrombosis ? ?Dimple Nanas, PharmD ?09/09/2021 7:22 AM ? ?

## 2021-09-09 NOTE — Progress Notes (Signed)
Physical Therapy Treatment ?Patient Details ?Name: Sierra [name] ?MRN: [id number] ?DOB: 08/13/1945 ?Today's Date: 09/09/2021 ? ? ?History of Present Illness 76 y.o. female with history of A-fib, diastolic CHF, hypertension, bilateral THA, Bilat. knee DJD presented with a mechanical fall and left shoulder and left hip pain.  On presentation x-rays of the left shoulder and hip did not show any fracture.  CT of the left pelvis did not show any fracture or hardware loosening but showed possible hematoma. was recently scheduled for RTKA 3/21 but cancelled due to spouse illness. ? ?  ?PT Comments  ? ? General Comments: AxO x 3 pleasant Lady but HIGH anxiety esp with response to pain.  Fear of falling. ?General bed mobility comments: very limited self ability due to L shoulder pain (inability to functional move), L hip pain, R side pain (shingles) and R foot pain.  Pt required increased time and Max Assist to transition to EOB.  Nearly 12 min to complete.  High anxiety and multiple pain.  Pt sat EOB at Supervision x 18 min.  Getting back to bed was more difficult.  Required Total Assist + 2 pt 0% .  Positioned to comfort. ?Pt will need ST Rehab at SNF prior to returning home.  ?Recommendations for follow up therapy are one component of a multi-disciplinary discharge planning process, led by the attending physician.  Recommendations may be updated based on patient status, additional functional criteria and insurance authorization. ? ?Follow Up Recommendations ? Skilled nursing-short term rehab (<3 hours/day) ?  ?  ?Assistance Recommended at Discharge Frequent or constant Supervision/Assistance  ?Patient can return home with the following A lot of help with walking and/or transfers;Two people to help with walking and/or transfers ?  ?Equipment Recommendations ?    ?  ?Recommendations for Other Services   ? ? ?  ?Precautions / Restrictions Precautions ?Precaution Comments: L shoulder painful to touch and move, L hip hematoma, R  side trunk shingles. Monitor HR ?Restrictions ?Weight Bearing Restrictions: No  ?  ? ?Mobility ? Bed Mobility ?Overal bed mobility: Needs Assistance ?  ?  ?Sidelying to sit: Max assist, +2 for physical assistance, +2 for safety/equipment ?  ?Sit to supine: +2 for physical assistance, +2 for safety/equipment, Total assist ?  ?General bed mobility comments: very limited self ability due to L shoulder pain (inability to functional move), L hip pain, R side pain (shingles) and R foot pain.  Pt required increased time and Max Assist to transition to EOB.  Nearly 12 min to complete.  High anxiety and multiple pain.  Pt sat EOB at Supervision x 18 min.  Getting back to bed was more difficult.  Required Total Assist + 2 pt 0% .  Positioned to comfort. ?  ? ?Transfers ?  ?  ?  ?  ?  ?  ?  ?  ?  ?General transfer comment: attempted sit stand took four partial trials before last attempt with + 2 assist to clear hips off bed with much effort.  Unable to functionally use L UE on walker.  Limited stance time approx 45 seconds due to fatigue.  Pt was unable to weightshift and unable to pivot even 1/4 turn to recliner. ?  ? ?Ambulation/Gait ?  ?  ?  ?  ?  ?  ?  ?General Gait Details: unable due to poor transfer ability. ? ? ?Stairs ?  ?  ?  ?  ?  ? ? ?Wheelchair Mobility ?  ? ?Modified Rankin (  Stroke Patients Only) ?  ? ? ?  ?Balance   ?  ?  ?  ?  ?  ?  ?  ?  ?  ?  ?  ?  ?  ?  ?  ?  ?  ?  ?  ? ?  ?Cognition Arousal/Alertness: Awake/alert ?Behavior During Therapy: [facility] for tasks assessed/performed, Anxious ?Overall Cognitive Status: Within Functional Limits for tasks assessed ?  ?  ?  ?  ?  ?  ?  ?  ?  ?  ?  ?  ?  ?  ?  ?  ?General Comments: AxO x 3 pleasant Lady but HIGH anxiety esp with response to pain.  Fear of falling. ?  ?  ? ?  ?Exercises   ? ?  ?General Comments   ?  ?  ? ?Pertinent Vitals/Pain Pain Assessment ?Pain Assessment: Faces ?Pain Location: L shoulder, L hup, R foot "everywhere where I have Arthritis". ?Pain  Descriptors / Indicators: Grimacing, Discomfort ?Pain Intervention(s): Monitored during session, Premedicated before session, Repositioned  ? ? ?Home Living   ?  ?  ?  ?  ?  ?  ?  ?  ?  ?   ?  ?Prior Function    ?  ?  ?   ? ?PT Goals (current goals can now be found in the care plan section) Progress towards PT goals: Progressing toward goals ? ?  ?Frequency ? ? ? Min 2X/week ? ? ? ?  ?PT Plan Current plan remains appropriate  ? ? ?Co-evaluation   ?  ?  ?  ?  ? ?  ?AM-PAC PT "6 Clicks" Mobility   ?Outcome Measure ? Help needed turning from your back to your side while in a flat bed without using bedrails?: Total ?Help needed moving from lying on your back to sitting on the side of a flat bed without using bedrails?: Total ?Help needed moving to and from a bed to a chair (including a wheelchair)?: Total ?Help needed standing up from a chair using your arms (e.g., wheelchair or bedside chair)?: Total ?Help needed to walk in hospital room?: Total ?Help needed climbing 3-5 steps with a railing? : Total ?6 Click Score: 6 ? ?  ?End of Session Equipment Utilized During Treatment: Gait belt ?Activity Tolerance: Patient limited by pain ?Patient left: in bed;with call bell/phone within reach;with bed alarm set ?Nurse Communication: Mobility status ?PT Visit Diagnosis: Unsteadiness on feet (R26.81);History of falling (Z91.81);Pain ?Pain - Right/Left: Left ?Pain - part of body: Shoulder ?  ? ? ?Time: 5498-2641 ?PT Time Calculation (min) (ACUTE ONLY): 40 min ? ?Charges:             ?          ? ?{Millee Denise  PTA ?Acute  Rehabilitation Services ?Pager      [phone] ?Office      [phone] ? ?

## 2021-09-09 NOTE — Progress Notes (Signed)
Occupational Therapy Treatment ?Patient Details ?Name: Sierra [name] ?MRN: [id number] ?DOB: 06/29/1945 ?Today's Date: 09/09/2021 ? ? ?History of present illness 76 y.o. female with history of A-fib, diastolic CHF, hypertension, bilateral THA, Bilat. knee DJD presented with a mechanical fall and left shoulder and left hip pain.  On presentation x-rays of the left shoulder and hip did not show any fracture.  CT of the left pelvis did not show any fracture or hardware loosening but showed possible hematoma. was recently scheduled for RTKA 3/21 but cancelled due to spouse illness. ?  ?OT comments ? Unfortunately session limited today 2* medical complication related to elevated HR. Patient premedicated for pain and very motivated to mobilize out of bed. Patient needing max A to mobilize L leg towards edge of bed, self able with R leg and cues to bring shoulders over in preparation for sitting up. Patient began touching her chest during bed mobility, checked HR on monitor reading 190-200. Activity stopped and RN notified. Patient needing total A bring legs back into bed and to reposition. BP in semi-supine 128/55, O2 reading 94-95% on RA and once resting for ~2-3 minutes HR in 120s. RN in room at end of session. Will continue to try and progress towards acute OT goals as medically able.  ? ?Recommendations for follow up therapy are one component of a multi-disciplinary discharge planning process, led by the attending physician.  Recommendations may be updated based on patient status, additional functional criteria and insurance authorization. ?   ?Follow Up Recommendations ? Skilled nursing-short term rehab (<3 hours/day)  ?  ?Assistance Recommended at Discharge Frequent or constant Supervision/Assistance  ?Patient can return home with the following ? Two people to help with walking and/or transfers;Two people to help with bathing/dressing/bathroom;Direct supervision/assist for medications management;Help with stairs or  ramp for entrance;Assist for transportation;Direct supervision/assist for financial management;Assistance with cooking/housework ?  ?Equipment Recommendations ? Other (comment) (defer next venue)  ?  ?   ?Precautions / Restrictions Precautions ?Precautions: Fall ?Precaution Comments: L shoulder painful to touch and move, L side trunk shingles. Monitor HR ?Restrictions ?Weight Bearing Restrictions: No  ? ? ?  ? ?Mobility Bed Mobility ?Overal bed mobility: Needs Assistance ?Bed Mobility:  (other) ?  ?  ?  ?  ?  ?General bed mobility comments: Patient needing significant assistance to mobilize L LE towards edge of bed 2* pain and stiffness. Cues to bring shoulders over and with increased time self able. Patient HR up to 200 with limited bed mobility therefore deferred and RN notified. ?  ? ?Transfers ?  ?  ?  ?  ?  ?  ?  ?  ?  ?General transfer comment: unable 2* HR ?  ?  ?   ? ? ? ? ? ?Cognition Arousal/Alertness: Awake/alert ?Behavior During Therapy: [facility] for tasks assessed/performed, Anxious ?Overall Cognitive Status: Within Functional Limits for tasks assessed ?  ?  ?  ?  ?  ?  ?  ?  ?  ?  ?  ?  ?  ?  ?  ?  ?  ?  ?  ?   ?   ?   ?General Comments Patient's HR up to 200 with limited bed mobility. Patient reporting did not have heart medication this AM as BP was too low. BP taken semi-supine 128/55, O2 94-95% on room air  ? ? ?Pertinent Vitals/ Pain       Pain Assessment ?Pain Assessment: Faces ?Faces Pain Scale: Hurts even more ?Pain  Location: Chest ?Pain Descriptors / Indicators: Grimacing, Discomfort ?Pain Intervention(s): Limited activity within patient's tolerance ? ?   ?   ? ?Frequency ? Min 2X/week  ? ? ? ? ?  ?Progress Toward Goals ? ?OT Goals(current goals can now be found in the care plan section) ? Progress towards OT goals: Not progressing toward goals - comment (medical complication this date elevated HR) ? ?Acute Rehab OT Goals ?Patient Stated Goal: "get out of here" ?OT Goal Formulation: With  patient ?Time For Goal Achievement: 09/22/21 ?Potential to Achieve Goals: Good ?ADL Goals ?Pt Will Perform Upper Body Dressing: with supervision;sitting ?Pt Will Perform Lower Body Dressing: with supervision;sit to/from stand;sitting/lateral leans ?Pt Will Transfer to Toilet: with min assist;ambulating;regular height toilet  ?Plan Discharge plan remains appropriate   ? ?   ?AM-PAC OT "6 Clicks" Daily Activity     ?Outcome Measure ? ? Help from another person eating meals?: A Little ?Help from another person taking care of personal grooming?: A Little ?Help from another person toileting, which includes using toliet, bedpan, or urinal?: Total ?Help from another person bathing (including washing, rinsing, drying)?: A Lot ?Help from another person to put on and taking off regular upper body clothing?: A Lot ?Help from another person to put on and taking off regular lower body clothing?: Total ?6 Click Score: 12 ? ?  ?End of Session   ? ?OT Visit Diagnosis: Unsteadiness on feet (R26.81);Pain;Muscle weakness (generalized) (M62.81);History of falling (Z91.81) ?Pain - Right/Left: Left ?Pain - part of body: Shoulder ?  ?Activity Tolerance Treatment limited secondary to medical complications (Comment) (Elevated HR) ?  ?Patient Left in bed;with call bell/phone within reach;with bed alarm set;with nursing/sitter in room ?  ?Nurse Communication Other (comment) (HR) ?  ? ?   ? ?Time: 4098-1191 ?OT Time Calculation (min): 25 min ? ?Charges: OT General Charges ?$OT Visit: 1 Visit ?OT Treatments ?$Therapeutic Activity: 8-22 mins ? ?Delbert Phenix OT ?OT pager: [phone] ? ? ?Sierra [name] ?09/09/2021, 10:22 AM ?

## 2021-09-09 NOTE — Progress Notes (Signed)
?PROGRESS NOTE ? ? ? ?Sierra [name]  IRW:[id number] DOB: 11-28-1945 DOA: 09/06/2021 ?PCP: Lavone Orn, MD  ? ?Brief Narrative:  ?76 y.o. female with history of A-fib, diastolic CHF, hypertension presented with a mechanical fall and left shoulder and left hip pain.  On presentation x-rays of the left shoulder and hip did not show any fracture.  CT of the left pelvis did not show any fracture or hardware loosening but showed possible hematoma.  Orthopedics recommended conservative management. ? ?Assessment & Plan: ?  ?Mechanical fall with severe left shoulder and hip pain ?Possible left hip hematoma ?-X-rays did not show any fracture.  CT of the left pelvis did not show any fracture or hardware loosening but showed possible hematoma. ?-Still complains of significant pain.  Continue pain management.  PT recommends SNF placement.  Social worker following.   ?-Orthopedics evaluation appreciated: Recommended conservative management and outpatient follow-up. ? ?Possible shingles over the right flank area ?-Continue Valtrex for a week.  Continue isolation. ? ?Paroxysmal A-fib ?-Patient extremely tachycardic this morning.  She apparently did not take her evening dose of metoprolol because of low blood pressure.  We will give metoprolol 25 mg 1 time now.  Resume long-acting metoprolol in the evening.  Coumadin to be dosed by pharmacy.  Monitor INR.  INR 3.4 today.  Outpatient follow-up with cardiology. ? ?Chronic diastolic CHF ?-Currently compensated.  Continue metoprolol, torsemide.  Strict input and output.  Daily weights.  Fluid restriction. ? ?Hypertension ?-Monitor blood pressure.  Blood pressure intermittently on the lower side.  Discontinue amlodipine and ARB.  Continue metoprolol and torsemide ? ?Anemia of chronic disease ?-Possibly from chronic illnesses ?-Hemoglobin stable.  Monitor intermittently ? ?Thrombocytopenia ?-Questionable cause.  No signs of bleeding.  Monitor ? ?Hypokalemia ?-resolved ? ?Right knee  arthritis ?-Was supposed to have surgery as an outpatient soon but was canceled ? ? ?DVT prophylaxis: Coumadin ?Code Status: Full ?Family Communication: None at bedside ?Disposition Plan: ?Status is: inpatient because: Needs monitoring for at least 24 hours to make sure heart rate stabilizes.  Needs SNF placement ? ? ?Consultants: Orthopedics ? ?Procedures: None ? ?Antimicrobials: None ? ? ?Subjective: ?Patient seen and examined at bedside.  Still complains of left shoulder and hip pain.  Nursing staff reports episode of tachycardia this morning.  No overnight fever, chest pain or shortness of breath reported.   ?Objective: ?Vitals:  ? 09/09/21 0522 09/09/21 0900 09/09/21 0905 09/09/21 0912  ?BP: (!) 118/46  (!) 128/55   ?Pulse: (!) 102     ?Resp: 20 15 (!) 22 15  ?Temp: 98.4 ?F (36.9 ?C)     ?TempSrc:      ?SpO2: 97%  94%   ?Weight:      ?Height:      ? ? ?Intake/Output Summary (Last 24 hours) at 09/09/2021 0954 ?Last data filed at 09/08/2021 1830 ?Gross per 24 hour  ?Intake --  ?Output 600 ml  ?Net -600 ml  ? ? ?Filed Weights  ? 09/06/21 1220  ?Weight: 79.8 kg  ? ? ?Examination: ? ?General exam: No acute distress.  Currently on room air.  Elderly female lying in bed. ?Respiratory system: Bilateral decreased breath sounds at bases with scattered crackles cardiovascular system: S1-S2 heard; tachycardic intermittently  ?gastrointestinal system: Abdomen is mildly distended; soft and nontender.  Normal bowel sounds heard  ?extremities: No cyanosis; mild lower extremity edema present.  Left shoulder and left hip tenderness present ?Central nervous system: Alert and oriented.  No focal neurological deficits.  Moving extremities.   ?Skin: Dermatomal rash on the right flank.  No other ecchymosis/lesions  ?psychiatry: Judgment, affect and mood are normal ? ? ?Data Reviewed: I have personally reviewed following labs and imaging studies ? ?CBC: ?Recent Labs  ?Lab 09/06/21 ?1800 09/07/21 ?2440 09/08/21 ?1027 09/09/21 ?0411  ?WBC  5.7 4.9 6.0 6.2  ?NEUTROABS 4.5  --   --   --   ?HGB 11.2* 10.1* 9.5* 7.9*  ?HCT 34.0* 31.3* 29.6* 23.2*  ?MCV 94.2 96.0 95.5 92.4  ?PLT 144* 141* 123* 137*  ? ? ?Basic Metabolic Panel: ?Recent Labs  ?Lab 09/06/21 ?1800 09/07/21 ?2536 09/08/21 ?6440  ?NA 138 139 138  ?K 3.3* 4.0 3.9  ?CL 108 108 107  ?CO2 '22 24 23  '$ ?GLUCOSE 131* 124* 123*  ?BUN 29* 25* 30*  ?CREATININE 0.90 0.89 1.05*  ?CALCIUM 8.7* 8.8* 8.5*  ?MG  --   --  2.3  ? ? ?GFR: ?Estimated Creatinine Clearance: 47.8 mL/min (A) (by C-G formula based on SCr of 1.05 mg/dL (H)). ?Liver Function Tests: ?No results for input(s): AST, ALT, ALKPHOS, BILITOT, PROT, ALBUMIN in the last 168 hours. ?No results for input(s): LIPASE, AMYLASE in the last 168 hours. ?No results for input(s): AMMONIA in the last 168 hours. ?Coagulation Profile: ?Recent Labs  ?Lab 09/06/21 ?1806 09/07/21 ?3474 09/08/21 ?2595 09/09/21 ?0411  ?INR 2.8* 2.8* 3.2* 3.4*  ? ? ?Cardiac Enzymes: ?No results for input(s): CKTOTAL, CKMB, CKMBINDEX, TROPONINI in the last 168 hours. ?BNP (last 3 results) ?No results for input(s): PROBNP in the last 8760 hours. ?HbA1C: ?No results for input(s): HGBA1C in the last 72 hours. ?CBG: ?No results for input(s): GLUCAP in the last 168 hours. ?Lipid Profile: ?No results for input(s): CHOL, HDL, LDLCALC, TRIG, CHOLHDL, LDLDIRECT in the last 72 hours. ?Thyroid Function Tests: ?No results for input(s): TSH, T4TOTAL, FREET4, T3FREE, THYROIDAB in the last 72 hours. ?Anemia Panel: ?No results for input(s): VITAMINB12, FOLATE, FERRITIN, TIBC, IRON, RETICCTPCT in the last 72 hours. ?Sepsis Labs: ?No results for input(s): PROCALCITON, LATICACIDVEN in the last 168 hours. ? ?Recent Results (from the past 240 hour(s))  ?Resp Panel by RT-PCR (Flu A&B, Covid) Nasopharyngeal Swab     Status: None  ? Collection Time: 09/06/21  5:45 PM  ? Specimen: Nasopharyngeal Swab; Nasopharyngeal(NP) swabs in vial transport medium  ?Result Value Ref Range Status  ? SARS Coronavirus 2 by RT  PCR NEGATIVE NEGATIVE Final  ?  Comment: (NOTE) ?SARS-CoV-2 target nucleic acids are NOT DETECTED. ? ?The SARS-CoV-2 RNA is generally detectable in upper respiratory ?specimens during the acute phase of infection. The lowest ?concentration of SARS-CoV-2 viral copies this assay can detect is ?138 copies/mL. A negative result does not preclude SARS-Cov-2 ?infection and should not be used as the sole basis for treatment or ?other patient management decisions. A negative result may occur with  ?improper specimen collection/handling, submission of specimen other ?than nasopharyngeal swab, presence of viral mutation(s) within the ?areas targeted by this assay, and inadequate number of viral ?copies(<138 copies/mL). A negative result must be combined with ?clinical observations, patient history, and epidemiological ?information. The expected result is Negative. ? ?Fact Sheet for Patients:  ?EntrepreneurPulse.com.au ? ?Fact Sheet for Healthcare Providers:  ?IncredibleEmployment.be ? ?This test is no t yet approved or cleared by the [place] FDA and  ?has been authorized for detection and/or diagnosis of SARS-CoV-2 by ?FDA under an Emergency Use Authorization (EUA). This EUA will remain  ?in effect (meaning this test can be used) for  the duration of the ?COVID-19 declaration under Section 564(b)(1) of the Act, 21 ?U.S.C.section 360bbb-3(b)(1), unless the authorization is terminated  ?or revoked sooner.  ? ? ?  ? Influenza A by PCR NEGATIVE NEGATIVE Final  ? Influenza B by PCR NEGATIVE NEGATIVE Final  ?  Comment: (NOTE) ?The Xpert Xpress SARS-CoV-2/FLU/RSV plus assay is intended as an aid ?in the diagnosis of influenza from Nasopharyngeal swab specimens and ?should not be used as a sole basis for treatment. Nasal washings and ?aspirates are unacceptable for Xpert Xpress SARS-CoV-2/FLU/RSV ?testing. ? ?Fact Sheet for Patients: ?EntrepreneurPulse.com.au ? ?Fact Sheet for  Healthcare Providers: ?IncredibleEmployment.be ? ?This test is not yet approved or cleared by the [place] FDA and ?has been authorized for detection and/or diagnosis of SARS-CoV-2 b

## 2021-09-10 DIAGNOSIS — I4821 Permanent atrial fibrillation: Secondary | ICD-10-CM | POA: Diagnosis not present

## 2021-09-10 DIAGNOSIS — R2681 Unsteadiness on feet: Secondary | ICD-10-CM | POA: Diagnosis not present

## 2021-09-10 DIAGNOSIS — Z7401 Bed confinement status: Secondary | ICD-10-CM | POA: Diagnosis not present

## 2021-09-10 DIAGNOSIS — M6281 Muscle weakness (generalized): Secondary | ICD-10-CM | POA: Diagnosis not present

## 2021-09-10 DIAGNOSIS — I5032 Chronic diastolic (congestive) heart failure: Secondary | ICD-10-CM | POA: Diagnosis not present

## 2021-09-10 DIAGNOSIS — Z743 Need for continuous supervision: Secondary | ICD-10-CM | POA: Diagnosis not present

## 2021-09-10 DIAGNOSIS — I499 Cardiac arrhythmia, unspecified: Secondary | ICD-10-CM | POA: Diagnosis not present

## 2021-09-10 DIAGNOSIS — I480 Paroxysmal atrial fibrillation: Secondary | ICD-10-CM | POA: Diagnosis not present

## 2021-09-10 DIAGNOSIS — W19XXXA Unspecified fall, initial encounter: Secondary | ICD-10-CM | POA: Diagnosis not present

## 2021-09-10 DIAGNOSIS — D638 Anemia in other chronic diseases classified elsewhere: Secondary | ICD-10-CM | POA: Diagnosis not present

## 2021-09-10 DIAGNOSIS — B029 Zoster without complications: Secondary | ICD-10-CM | POA: Diagnosis not present

## 2021-09-10 DIAGNOSIS — D649 Anemia, unspecified: Secondary | ICD-10-CM | POA: Diagnosis not present

## 2021-09-10 DIAGNOSIS — G43919 Migraine, unspecified, intractable, without status migrainosus: Secondary | ICD-10-CM | POA: Diagnosis not present

## 2021-09-10 DIAGNOSIS — E785 Hyperlipidemia, unspecified: Secondary | ICD-10-CM | POA: Diagnosis not present

## 2021-09-10 DIAGNOSIS — R2689 Other abnormalities of gait and mobility: Secondary | ICD-10-CM | POA: Diagnosis not present

## 2021-09-10 DIAGNOSIS — Z9181 History of falling: Secondary | ICD-10-CM | POA: Diagnosis not present

## 2021-09-10 DIAGNOSIS — K219 Gastro-esophageal reflux disease without esophagitis: Secondary | ICD-10-CM | POA: Diagnosis not present

## 2021-09-10 DIAGNOSIS — G8929 Other chronic pain: Secondary | ICD-10-CM | POA: Diagnosis not present

## 2021-09-10 DIAGNOSIS — M1990 Unspecified osteoarthritis, unspecified site: Secondary | ICD-10-CM | POA: Diagnosis not present

## 2021-09-10 DIAGNOSIS — M1711 Unilateral primary osteoarthritis, right knee: Secondary | ICD-10-CM | POA: Diagnosis not present

## 2021-09-10 DIAGNOSIS — S8012XS Contusion of left lower leg, sequela: Secondary | ICD-10-CM | POA: Diagnosis not present

## 2021-09-10 DIAGNOSIS — D696 Thrombocytopenia, unspecified: Secondary | ICD-10-CM | POA: Diagnosis not present

## 2021-09-10 DIAGNOSIS — J209 Acute bronchitis, unspecified: Secondary | ICD-10-CM | POA: Diagnosis not present

## 2021-09-10 DIAGNOSIS — I5030 Unspecified diastolic (congestive) heart failure: Secondary | ICD-10-CM | POA: Diagnosis not present

## 2021-09-10 DIAGNOSIS — I10 Essential (primary) hypertension: Secondary | ICD-10-CM | POA: Diagnosis not present

## 2021-09-10 DIAGNOSIS — M25519 Pain in unspecified shoulder: Secondary | ICD-10-CM | POA: Diagnosis not present

## 2021-09-10 DIAGNOSIS — R41841 Cognitive communication deficit: Secondary | ICD-10-CM | POA: Diagnosis not present

## 2021-09-10 DIAGNOSIS — M545 Low back pain, unspecified: Secondary | ICD-10-CM | POA: Diagnosis not present

## 2021-09-10 LAB — BASIC METABOLIC PANEL
Anion gap: 9 (ref 5–15)
BUN: 39 mg/dL — ABNORMAL HIGH (ref 8–23)
CO2: 24 mmol/L (ref 22–32)
Calcium: 8.8 mg/dL — ABNORMAL LOW (ref 8.9–10.3)
Chloride: 103 mmol/L (ref 98–111)
Creatinine, Ser: 1.28 mg/dL — ABNORMAL HIGH (ref 0.44–1.00)
GFR, Estimated: 43 mL/min — ABNORMAL LOW (ref >60)
Glucose, Bld: 181 mg/dL — ABNORMAL HIGH (ref 70–99)
Potassium: 4.5 mmol/L (ref 3.5–5.1)
Sodium: 136 mmol/L (ref 135–145)

## 2021-09-10 LAB — CBC
HCT: 28.3 % — ABNORMAL LOW (ref 36.0–46.0)
Hemoglobin: 9.2 g/dL — ABNORMAL LOW (ref 12.0–15.0)
MCH: 31.1 pg (ref 26.0–34.0)
MCHC: 32.5 g/dL (ref 30.0–36.0)
MCV: 95.6 fL (ref 80.0–100.0)
Platelets: 148 10*3/uL — ABNORMAL LOW (ref 150–400)
RBC: 2.96 MIL/uL — ABNORMAL LOW (ref 3.87–5.11)
RDW: 13.2 % (ref 11.5–15.5)
WBC: 7.1 10*3/uL (ref 4.0–10.5)
nRBC: 0 % (ref 0.0–0.2)

## 2021-09-10 LAB — PROTIME-INR
INR: 2.4 — ABNORMAL HIGH (ref 0.8–1.2)
Prothrombin Time: 26.5 seconds — ABNORMAL HIGH (ref 11.4–15.2)

## 2021-09-10 LAB — MAGNESIUM: Magnesium: 2.2 mg/dL (ref 1.7–2.4)

## 2021-09-10 MED ORDER — OXYCODONE HCL 5 MG PO TABS: 5.0000 mg | ORAL_TABLET | ORAL | 0 refills | Status: DC | PRN

## 2021-09-10 MED ORDER — PREDNISONE 20 MG PO TABS: 40.0000 mg | ORAL_TABLET | Freq: Every day | ORAL | 0 refills | Status: AC

## 2021-09-10 MED ORDER — POLYETHYLENE GLYCOL 3350 17 G PO PACK: 17.0000 g | PACK | Freq: Every day | ORAL | 0 refills | Status: DC | PRN

## 2021-09-10 MED ORDER — SENNOSIDES-DOCUSATE SODIUM 8.6-50 MG PO TABS
1.0000 | ORAL_TABLET | Freq: Two times a day (BID) | ORAL | 0 refills | Status: DC
Start: 2021-09-10 — End: 2021-12-02

## 2021-09-10 MED ORDER — METOPROLOL TARTRATE 5 MG/5ML IV SOLN: 2.5000 mg | Freq: Once | INTRAVENOUS | Status: DC | PRN

## 2021-09-10 MED ORDER — WARFARIN SODIUM 5 MG PO TABS: 2.5000 mg | ORAL_TABLET | Freq: Every day | ORAL | Status: DC

## 2021-09-10 MED ORDER — VALACYCLOVIR HCL 1 G PO TABS: 1000.0000 mg | ORAL_TABLET | Freq: Three times a day (TID) | ORAL | 0 refills | Status: AC

## 2021-09-10 MED ORDER — WARFARIN SODIUM 2.5 MG PO TABS
2.5000 mg | ORAL_TABLET | Freq: Once | ORAL | Status: AC
  Administered 2021-09-10: 2.5 mg via ORAL
  Filled 2021-09-10: qty 1

## 2021-09-10 MED ORDER — WARFARIN SODIUM 5 MG PO TABS
5.0000 mg | ORAL_TABLET | Freq: Every day | ORAL | 1 refills | Status: DC
Start: 2021-09-10 — End: 2021-09-10

## 2021-09-10 NOTE — Progress Notes (Signed)
09/09/21 ? ?At 2305 I received a call from central telemetry stating the patient had a heart rate in the 200s. Went to the patient and assessed her. She was trying to have a bowel movement and was in immense pain with trying to move so she could go on the bed pain. Notified the doctor on call about the elevated heart rate. The on call MD ordered IV metoprolol 2.5 mg and pain medication was also administered to the patient in addition to this. The patient was placed on 2L nasal cannula to supplement her breathing. The patient was straining to have a bowel movement and the patient was educated on how the straining motion can be a factor in her elevated heart rate. Patient was taken off the bedpan and cleaned up. Remained with the patient while she calmed herself down. Patient's heart rate went back to her baseline of low 100-110s. ?

## 2021-09-10 NOTE — Consult Note (Addendum)
?Cardiology Consultation:  ? ?Patient ID: Sierra [name] ?MRN: [id number]; DOB: [birth year] ? ?Admit date: 09/06/2021 ?Date of Consult: 09/10/2021 ? ?PCP:  Lavone Orn, MD ?  ?[facility] ?Cardiologist:  Larae Grooms, MD      ? ? ?Patient Profile:  ? ?Sierra [name] is a 76 y.o. female with a hx of HTN, PACs, permanent atrial fibrillation and chronic diastolic CHF who is being seen 09/10/2021 for the evaluation of atrial fibrillation with RVR at the request of Dr. Starla Link. ? ?History of Present Illness:  ? ?Sierra [name] is a pleasant 76 yo female with PMH of HTN, PACs, permanent atrial fibrillation and chronic diastolic CHF.  Patient was first diagnosed with atrial fibrillation in the setting of severe GI bug in December 2016.  She had a successful cardioversion in January 2019.  Unfortunately, by February 2019, she has went back into atrial fibrillation.  Since then, rate control strategy was pursued and she was left in atrial fibrillation.  EKG obtained in 2021-2022 all shows rate controlled atrial fibrillation with heart rate in the 70s to 80s.  Per patient, she no longer feel any palpitation, shortness of breath or fatigue.  She denies any recent chest discomfort or worsening dyspnea.  She went to her PCPs office on 08/27/2021 for rash on the right flank area concerning for shingles.  Unfortunately she tripped over the curb at PCPs office and the fell to her left side.  She was sent to [facility].  CT of the pelvis shows no evidence of acute fracture, however there was a 4.3 cm subcutaneous collection over the left hip concerning for hematoma.  Patient was originally scheduled for right knee surgery this week but canceled due to personal issue.  Orthopedic service saw the patient and felt there was no hurry to proceed with orthopedic surgery at this time.  They plan to see her as outpatient.  After admission, hemoglobin trended down from 11.2 down to 7.9, however bounced back to 9.2 this  morning.  Platelet dropped from 144 down to 123 and bounced back to 148 this morning.  Her metoprolol succinate at 150 mg was held the day before due to borderline low blood pressure.  PT recommended skilled nursing facility.  Yesterday, she had 2 bouts of elevated heart rate.  Her baseline heart rate is in the high 90s low 100 range, however jumped up to 180 bpm between 8:45 AM-9:15 AM and again from 11 PM-11:50 PM.  Talking with the patient, she says the morning episode occurred while she was working with occupational therapy.  The nighttime episode occurred while she was straining to have a bowel movement.  Her amlodipine and irbesartan (on valsartan at home) are currently being held since yesterday. ? ? ?Past Medical History:  ?Diagnosis Date  ? Anxiety   ? Arthritis   ? "joints" (09/19/2013)  ? Atrial fibrillation ([place])   ? 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 CHF (congestive heart failure) ([place])   ? Chronic lower back pain   ? Dysrhythmia   ? GERD (gastroesophageal reflux disease)   ? H/O hiatal hernia   ? Hypertension   ? Migraine   ? "haven't had any in a long time" (09/19/2013)  ? Pneumonia ~ 2012  ? ? ?Past Surgical History:  ?Procedure Laterality Date  ? BASAL CELL CARCINOMA EXCISION Left   ? "cheek"  ? CARDIOVERSION N/A 07/15/2017  ? Procedure: CARDIOVERSION;  Surgeon: Thayer Headings, MD;  Location: [facility];  Service: Cardiovascular;  Laterality: N/A;  ? CHOLECYSTECTOMY  02/19/1989  ? COLONOSCOPY WITH PROPOFOL N/A 07/05/2016  ? Procedure: COLONOSCOPY WITH PROPOFOL;  Surgeon: Garlan Fair, MD;  Location: [facility];  Service: Endoscopy;  Laterality: N/A;  ? DILATION AND CURETTAGE OF UTERUS    ? HERNIA REPAIR  74/82/7078  ? umbilical  ? JOINT REPLACEMENT    ? MOHS SURGERY    ? OOPHORECTOMY Bilateral   ? REVISION TOTAL HIP ARTHROPLASTY Left 09/19/2013  ? SHOULDER ARTHROSCOPY W/ ROTATOR CUFF REPAIR Right   ? TEMPOROMANDIBULAR JOINT SURGERY    ? TMJ ARTHROPLASTY    ? TOTAL HIP ARTHROPLASTY Left  06/22/2007  ? TOTAL HIP ARTHROPLASTY Right 06/21/2009  ? TOTAL HIP REVISION Left 09/19/2013  ? Procedure: LEFT TOTAL HIP REVISION;  Surgeon: Kerin Salen, MD;  Location: [place];  Service: Orthopedics;  Laterality: Left;  ? TUBAL LIGATION    ? VAGINAL HYSTERECTOMY  02/19/1989  ?  ? ?Home Medications:  ?Prior to Admission medications   ?Medication Sig Start Date End Date Taking? Authorizing Provider  ?acetaminophen (TYLENOL) 650 MG CR tablet Take 1,300 mg by mouth in the morning and at bedtime.   Yes [provider]  ?amLODipine (NORVASC) 5 MG tablet Take 5 mg by mouth daily. 02/08/21  Yes [provider]  ?Cholecalciferol (VITAMIN D-3) 5000 UNITS TABS Take 5,000 Units by mouth every Monday, Wednesday, and Friday.    Yes [provider]  ?citalopram (CELEXA) 20 MG tablet Take 20 mg by mouth daily. 07/31/21  Yes [provider]  ?Coenzyme Q10 (CO Q-10) 100 MG CAPS Take 100 mg by mouth daily.   Yes [provider]  ?colestipol (COLESTID) 5 g packet Take 5 g by mouth 2 (two) times daily.  01/06/17  Yes [provider]  ?fexofenadine (ALLEGRA) 180 MG tablet Take 180 mg by mouth daily as needed for allergies.   Yes [provider]  ?Glucosamine HCl 1000 MG TABS Take 1,000 mg by mouth daily.   Yes [provider]  ?Magnesium 250 MG TABS Take 250 mg by mouth every Monday, Wednesday, and Friday.   Yes [provider]  ?Menthol, Topical Analgesic, (BIOFREEZE) 4 % GEL Apply 1 application. topically daily as needed (pain).   Yes [provider]  ?metolazone (ZAROXOLYN) 5 MG tablet Take 5 mg by mouth daily as needed (Takes is weight gets above 195).  06/22/17  Yes [provider]  ?metoprolol succinate (TOPROL-XL) 50 MG 24 hr tablet TAKE 3 TABLETS BY MOUTH EVERY DAY ?Patient taking differently: Take 150 mg by mouth at bedtime. 06/11/21  Yes Jettie Booze, MD  ?Multiple Vitamins-Minerals (MEMORY VITE PO) Take 3 tablets by mouth  daily. Memory plus   Yes [provider]  ?Multiple Vitamins-Minerals (MULTIVITAMIN WITH MINERALS) tablet Take 1 tablet by mouth daily. 50+ for her   Yes [provider]  ?Multiple Vitamins-Minerals (PRESERVISION AREDS 2) CAPS Take 1 capsule by mouth in the morning and at bedtime.   Yes [provider]  ?potassium chloride (KLOR-CON) 10 MEQ tablet Take 10 mEq by mouth daily.   Yes [provider]  ?RABEprazole (ACIPHEX) 20 MG tablet Take 20 mg by mouth daily.    Yes [provider]  ?terazosin (HYTRIN) 10 MG capsule Take 10 mg by mouth at bedtime.   Yes [provider]  ?torsemide (DEMADEX) 20 MG tablet Take 20 mg by mouth daily.   Yes [provider]  ?  valsartan (DIOVAN) 160 MG tablet Take 1 tablet (160 mg total) by mouth daily. 05/10/17  Yes Jettie Booze, MD  ?warfarin (COUMADIN) 5 MG tablet TAKE AS DIRECTED BY COUMADIN CLINIC ?Patient taking differently: Take 2.5-5 mg by mouth See admin instructions. Take as directed by coumadin clinic; 5 mg Mon and Fri, 2.5 mg all other days. 03/13/21  Yes Jettie Booze, MD  ? ? ?Inpatient Medications: ?Scheduled Meds: ? citalopram  20 mg Oral Daily  ? colestipol  5 g Oral BID  ? magnesium oxide  200 mg Oral Q M,W,F  ? methylPREDNISolone (SOLU-MEDROL) injection  40 mg Intravenous Daily  ? metoprolol succinate  150 mg Oral QHS  ? pantoprazole  40 mg Oral Daily  ? potassium chloride  10 mEq Oral Daily  ? potassium chloride  20 mEq Oral Once  ? senna-docusate  1 tablet Oral BID  ? terazosin  10 mg Oral QHS  ? torsemide  20 mg Oral Daily  ? valACYclovir  1,000 mg Oral TID  ? Warfarin - Pharmacist Dosing Inpatient   Does not apply G8916  ? ?Continuous Infusions: ? ?PRN Meds: ?bisacodyl, metoprolol tartrate, morphine injection, oxyCODONE-acetaminophen, polyethylene glycol ? ?Allergies:    ?Allergies  ?Allergen Reactions  ? Ace Inhibitors Cough  ? Codeine Nausea Only  ? Erythromycin Other (See Comments)  ?   Stomach pain ?  ? Iohexol Hives  ?   Code: HIVES, Desc: patient states develops hives w/ iv contrast/mms ?  ? Iodinated Contrast Media Other (See Comments)  ?  Hives  ? ? ?Social History:   ?Social History  ? ?So

## 2021-09-10 NOTE — Progress Notes (Signed)
? ? ?  OVERNIGHT PROGRESS REPORT ? ?Notified by RN for heart rate of 180 to 200 bpm. ?At the time patient was being assisted to bedside commode. ? ?Patient describes some pain of the extremity with mobility. ? ?Prior to arrival at bedside patient was given medication for pain control. ?Heart rate appears to be A-fib/sinus tach (switching back and forth). ? ?Low-dose Lopressor IV was given x1 dose.  This dose. coupled with the pain control. alleviated discomfort and returned heart rate to baseline noted and prior physician note 100 -118  ?  ?Patient appears in no obvious or stated distress at this time and is returned to bed. ? ? ? ?Gershon Cull MSNA MSN ACNPC-AG ?Acute Care Nurse Practitioner ?[facility] ?[place] ? ? ? ?

## 2021-09-10 NOTE — Progress Notes (Signed)
ANTICOAGULATION CONSULT NOTE - Follow up Consult ? ?Pharmacy Consult for warfarin ?Indication: atrial fibrillation ? ?Allergies  ?Allergen Reactions  ? Ace Inhibitors Cough  ? Codeine Nausea Only  ? Erythromycin Other (See Comments)  ?  Stomach pain ?  ? Iohexol Hives  ?   Code: HIVES, Desc: patient states develops hives w/ iv contrast/mms ?  ? Iodinated Contrast Media Other (See Comments)  ?  Hives  ? ? ?Patient Measurements: ?Height: '5\' 6"'$  (167.6 cm) ?Weight: 79.8 kg (176 lb) ?IBW/kg (Calculated) : 59.3 ? ?Vital Signs: ?Temp: 98.5 ?F (36.9 ?C) (03/23 0335) ?Temp Source: Oral (03/23 0335) ?BP: 128/63 (03/23 0335) ?Pulse Rate: 95 (03/23 0335) ? ?Labs: ?Recent Labs  ?  09/08/21 ?1610 09/09/21 ?0411 09/10/21 ?0353  ?HGB 9.5* 7.9* 9.2*  ?HCT 29.6* 23.2* 28.3*  ?PLT 123* 137* 148*  ?LABPROT 32.7* 34.1* 26.5*  ?INR 3.2* 3.4* 2.4*  ?CREATININE 1.05*  --  1.28*  ? ? ? ?Estimated Creatinine Clearance: 39.2 mL/min (A) (by C-G formula based on SCr of 1.28 mg/dL (H)). ? ?Medications:  ?Medications Prior to Admission  ?Medication Sig Dispense Refill Last Dose  ? acetaminophen (TYLENOL) 650 MG CR tablet Take 1,300 mg by mouth in the morning and at bedtime.   09/06/2021  ? amLODipine (NORVASC) 5 MG tablet Take 5 mg by mouth daily.   09/06/2021  ? Cholecalciferol (VITAMIN D-3) 5000 UNITS TABS Take 5,000 Units by mouth every Monday, Wednesday, and Friday.    09/04/2021  ? citalopram (CELEXA) 20 MG tablet Take 20 mg by mouth daily.   09/06/2021  ? Coenzyme Q10 (CO Q-10) 100 MG CAPS Take 100 mg by mouth daily.   09/06/2021  ? colestipol (COLESTID) 5 g packet Take 5 g by mouth 2 (two) times daily.   3 09/06/2021  ? fexofenadine (ALLEGRA) 180 MG tablet Take 180 mg by mouth daily as needed for allergies.   09/05/2021  ? Glucosamine HCl 1000 MG TABS Take 1,000 mg by mouth daily.   09/05/2021  ? Magnesium 250 MG TABS Take 250 mg by mouth every Monday, Wednesday, and Friday.   09/04/2021  ? Menthol, Topical Analgesic, (BIOFREEZE) 4 % GEL Apply 1  application. topically daily as needed (pain).   09/05/2021  ? metolazone (ZAROXOLYN) 5 MG tablet Take 5 mg by mouth daily as needed (Takes is weight gets above 195).   0 Past Month  ? metoprolol succinate (TOPROL-XL) 50 MG 24 hr tablet TAKE 3 TABLETS BY MOUTH EVERY DAY (Patient taking differently: Take 150 mg by mouth at bedtime.) 270 tablet 2 09/05/2021 at 11 pm  ? Multiple Vitamins-Minerals (MEMORY VITE PO) Take 3 tablets by mouth daily. Memory plus   09/05/2021  ? Multiple Vitamins-Minerals (MULTIVITAMIN WITH MINERALS) tablet Take 1 tablet by mouth daily. 50+ for her   09/06/2021  ? Multiple Vitamins-Minerals (PRESERVISION AREDS 2) CAPS Take 1 capsule by mouth in the morning and at bedtime.   09/05/2021  ? potassium chloride (KLOR-CON) 10 MEQ tablet Take 10 mEq by mouth daily.   09/05/2021  ? RABEprazole (ACIPHEX) 20 MG tablet Take 20 mg by mouth daily.    09/06/2021  ? terazosin (HYTRIN) 10 MG capsule Take 10 mg by mouth at bedtime.   09/05/2021  ? torsemide (DEMADEX) 20 MG tablet Take 20 mg by mouth daily.   09/05/2021  ? valsartan (DIOVAN) 160 MG tablet Take 1 tablet (160 mg total) by mouth daily. 90 tablet 3 09/06/2021  ? warfarin (COUMADIN) 5 MG tablet TAKE  AS DIRECTED BY COUMADIN CLINIC (Patient taking differently: Take 2.5-5 mg by mouth See admin instructions. Take as directed by coumadin clinic; 5 mg Mon and Fri, 2.5 mg all other days.) 80 tablet 1 09/06/2021 at 7 pm  ? ?Scheduled:  ? citalopram  20 mg Oral Daily  ? colestipol  5 g Oral BID  ? magnesium oxide  200 mg Oral Q M,W,F  ? methylPREDNISolone (SOLU-MEDROL) injection  40 mg Intravenous Daily  ? metoprolol succinate  150 mg Oral QHS  ? pantoprazole  40 mg Oral Daily  ? potassium chloride  10 mEq Oral Daily  ? potassium chloride  20 mEq Oral Once  ? senna-docusate  1 tablet Oral BID  ? terazosin  10 mg Oral QHS  ? torsemide  20 mg Oral Daily  ? valACYclovir  1,000 mg Oral TID  ? Warfarin - Pharmacist Dosing Inpatient   Does not apply C4888  ? ?PRN:   ? ?Assessment: ?73 yoF with PMH PAF on warfarin PTA, CHF, HTN, admitted 3/19 with shoulder & hip pain after fall. Patient was reportedly on her way to clinic to be seen for possible new shingles rash. X-rays negative for fracture, CT of pelvis showed possible hematoma.  No ortho consult or mention of ortho procedures at this point, and Pharmacy to dose warfarin. ?Prior anticoagulation: warfarin 5 mg on Mon, Fri; 2.5 mg all other days; LD 3/19 (took dose in ED) - patient has been maintained on this dosing for several months per anticoag clinic notes.  Baseline INR therapeutic ?3.21: Ortho consulted and are not planning any surgical interventions. ? ?Today, 09/10/2021: ?INR 2.4 is therapeutic and decreased from 3.4 yesterday after holding for 2 days.   ?CBC: Hgb increased to 9.2, Plt remain low/stable at 148k ?Drug interactions:  ?Citalopram may increase risk of bleeding, however patient has been maintained on this regimen PTA. ?Torsemide may increase warfarin concentrations, however patient has been maintained on this regimen PTA. ?Diet ordered ?No signs of bleeding reported ? ?Goal of Therapy: ?INR 2-3 ? ?Plan: ?Resume low dose warfarin 2.5 mg today.   ?Daily INR ?CBC at least q72 hr while on warfarin ?Monitor for signs of bleeding or thrombosis ?For discharge, recommend Warfarin 2.5 mg daily with INR by Monday, 3/27  ? ? ?Gretta Arab PharmD, BCPS ?Clinical Pharmacist ?[facility] [phone] ?09/10/2021 9:07 AM ? ?

## 2021-09-10 NOTE — Discharge Summary (Addendum)
Physician Discharge Summary  ?Sierra [name] EQA:[id number] DOB: September 19, 1945 DOA: 09/06/2021 ? ?PCP: Lavone Orn, MD ? ?Admit date: 09/06/2021 ?Discharge date: 09/10/2021 ? ?Admitted From: Home ?Disposition: SNF ? ?Recommendations for Outpatient Follow-up:  ?Follow up with SNF provider at earliest convenience with repeat CBC/BMP/INR.  Coumadin to be dosed as per INR values ?Outpatient follow-up with orthopedics and cardiology ?Follow up in ED if symptoms worsen or new appear ? ? ?Home Health: No ?Equipment/Devices: None ? ?Discharge Condition: Stable ?CODE STATUS: Full ?Diet recommendation: Heart healthy/fluid restriction of up to 1500 cc a day ? ?Brief/Interim Summary: ?76 y.o. female with history of A-fib, diastolic CHF, hypertension presented with a mechanical fall and left shoulder and left hip pain.  On presentation x-rays of the left shoulder and hip did not show any fracture.  CT of the left pelvis did not show any fracture or hardware loosening but showed possible hematoma.  Orthopedics recommended conservative management.  PT recommended SNF placement.  She has had issues with A-fib with RVR specially with pain and exertion which improved with rest.  Cardiology was consulted and recommended to continue metoprolol.  Outpatient follow-up with cardiology.  She will be discharged to SNF once bed is available.  Outpatient follow-up with orthopedics. ? ?Discharge Diagnoses:  ? ?Mechanical fall with severe left shoulder and hip pain ?Possible left hip hematoma ?-X-rays did not show any fracture.  CT of the left pelvis did not show any fracture or hardware loosening but showed possible hematoma. ?-Pain improving with current pain medication regimen.  Continue pain management.  PT recommends SNF placement.  Discharge to SNF once bed is available. ?-Orthopedics evaluation appreciated: Recommended conservative management and outpatient follow-up. ?-Patient complained of severe right foot pain on 09/09/2021 and was  concerned about arthritis flareup: I put her on IV Solu-Medrol.  Pain is improving.  We will use prednisone 40 mg daily for 7 days on discharge. ?  ?Possible shingles over the right flank area ?-Continue Valtrex for a week.  Continue isolation.  Rash is healing. ?  ?Paroxysmal A-fib ?Supratherapeutic INR: Resolved ?- She has had issues with A-fib with RVR specially with pain and exertion which improved with rest.  Cardiology was consulted and recommended to continue metoprolol.  Outpatient follow-up with cardiology.  INR is therapeutic today.  Resume Coumadin on discharge.  Dose to be determined as per INR levels. ?  ?Chronic diastolic CHF ?-Currently compensated.  Continue metoprolol, torsemide.  Continue diet and fluid restriction.  Outpatient follow-up with cardiology. ? ?Hypertension ?-Monitor blood pressure. Discontinued amlodipine and ARB due to intermittent hypotension.  Continue metoprolol and torsemide ? ?Anemia of chronic disease ?-Possibly from chronic illnesses ?-Hemoglobin stable.  Outpatient follow-up. ? ?Thrombocytopenia ?-Questionable cause.  No signs of bleeding.  Outpatient follow-up. ?Hypokalemia ?-resolved ? ?Right knee arthritis ?-Was supposed to have surgery as an outpatient soon but was canceled ? ?Discharge Instructions ? ? ?Allergies as of 09/10/2021   ? ?   Reactions  ? Ace Inhibitors Cough  ? Codeine Nausea Only  ? Erythromycin Other (See Comments)  ? Stomach pain  ? Iohexol Hives  ?  Code: HIVES, Desc: patient states develops hives w/ iv contrast/mms  ? Iodinated Contrast Media Other (See Comments)  ? Hives  ? ?  ? ?  ?Medication List  ?  ? ?STOP taking these medications   ? ?amLODipine 5 MG tablet ?Commonly known as: NORVASC ?  ?valsartan 160 MG tablet ?Commonly known as: DIOVAN ?  ? ?  ? ?TAKE  these medications   ? ?acetaminophen 650 MG CR tablet ?Commonly known as: TYLENOL ?Take 1,300 mg by mouth in the morning and at bedtime. ?  ?Biofreeze 4 % Gel ?Generic drug: Menthol (Topical  Analgesic) ?Apply 1 application. topically daily as needed (pain). ?  ?citalopram 20 MG tablet ?Commonly known as: CELEXA ?Take 20 mg by mouth daily. ?  ?Co Q-10 100 MG Caps ?Take 100 mg by mouth daily. ?  ?colestipol 5 g packet ?Commonly known as: COLESTID ?Take 5 g by mouth 2 (two) times daily. ?  ?fexofenadine 180 MG tablet ?Commonly known as: ALLEGRA ?Take 180 mg by mouth daily as needed for allergies. ?  ?Glucosamine HCl 1000 MG Tabs ?Take 1,000 mg by mouth daily. ?  ?Magnesium 250 MG Tabs ?Take 250 mg by mouth every Monday, Wednesday, and Friday. ?  ?metolazone 5 MG tablet ?Commonly known as: ZAROXOLYN ?Take 5 mg by mouth daily as needed (Takes is weight gets above 195). ?  ?metoprolol succinate 50 MG 24 hr tablet ?Commonly known as: TOPROL-XL ?TAKE 3 TABLETS BY MOUTH EVERY DAY ?What changed: when to take this ?  ?PreserVision AREDS 2 Caps ?Take 1 capsule by mouth in the morning and at bedtime. ?  ?MEMORY VITE PO ?Take 3 tablets by mouth daily. Memory plus ?  ?multivitamin with minerals tablet ?Take 1 tablet by mouth daily. 50+ for her ?  ?oxyCODONE 5 MG immediate release tablet ?Commonly known as: Roxicodone ?Take 1 tablet (5 mg total) by mouth every 4 (four) hours as needed for moderate pain. ?  ?polyethylene glycol 17 g packet ?Commonly known as: MIRALAX / GLYCOLAX ?Take 17 g by mouth daily as needed for moderate constipation. ?  ?potassium chloride 10 MEQ tablet ?Commonly known as: KLOR-CON ?Take 10 mEq by mouth daily. ?  ?predniSONE 20 MG tablet ?Commonly known as: DELTASONE ?Take 2 tablets (40 mg total) by mouth daily with breakfast for 7 days. ?  ?RABEprazole 20 MG tablet ?Commonly known as: ACIPHEX ?Take 20 mg by mouth daily. ?  ?senna-docusate 8.6-50 MG tablet ?Commonly known as: Senokot-S ?Take 1 tablet by mouth 2 (two) times daily. ?  ?terazosin 10 MG capsule ?Commonly known as: HYTRIN ?Take 10 mg by mouth at bedtime. ?  ?torsemide 20 MG tablet ?Commonly known as: DEMADEX ?Take 20 mg by mouth  daily. ?  ?valACYclovir 1000 MG tablet ?Commonly known as: VALTREX ?Take 1 tablet (1,000 mg total) by mouth 3 (three) times daily for 3 days. ?  ?Vitamin D-3 125 MCG (5000 UT) Tabs ?Take 5,000 Units by mouth every Monday, Wednesday, and Friday. ?  ?warfarin 5 MG tablet ?Commonly known as: COUMADIN ?Take as directed. If you are unsure how to take this medication, talk to your nurse or doctor. ?Original instructions: Take 0.5 tablets (2.5 mg total) by mouth at bedtime. ?What changed: See the new instructions. ?  ? ?  ? ? ? ? ?Allergies  ?Allergen Reactions  ? Ace Inhibitors Cough  ? Codeine Nausea Only  ? Erythromycin Other (See Comments)  ?  Stomach pain ?  ? Iohexol Hives  ?   Code: HIVES, Desc: patient states develops hives w/ iv contrast/mms ?  ? Iodinated Contrast Media Other (See Comments)  ?  Hives  ? ? ?Consultations: ?Orthopedics/cardiology ? ? ?Procedures/Studies: ?DG Chest 2 View ? ?Result Date: 08/27/2021 ?CLINICAL DATA:  Planned knee surgery history of AFib EXAM: CHEST - 2 VIEW COMPARISON:  04/07/2017 FINDINGS: Mild cardiomegaly. No focal opacity or pleural effusion. No pneumothorax.  IMPRESSION: No active cardiopulmonary disease.  Mild cardiomegaly Electronically Signed   By: Donavan Foil M.D.   On: 08/27/2021 19:40  ? ?CT PELVIS WO CONTRAST ? ?Result Date: 09/07/2021 ?CLINICAL DATA:  Pelvic fracture. EXAM: CT PELVIS WITHOUT CONTRAST TECHNIQUE: Multidetector CT imaging of the pelvis was performed following the standard protocol without intravenous contrast. RADIATION DOSE REDUCTION: This exam was performed according to the departmental dose-optimization program which includes automated exposure control, adjustment of the mA and/or kV according to patient size and/or use of iterative reconstruction technique. COMPARISON:  06/08/2015, 09/06/2021. FINDINGS: Urinary Tract: The visualized portion of the urinary bladder is within normal limits. Examination is limited due to artifact from bilateral hip prosthesis.  Bowel: No bowel obstruction. The appendix is unremarkable. A few diverticula are noted along the colon without evidence of diverticulitis. Vascular/Lymphatic: No pathologically enlarged lymph nodes. No significant vascu

## 2021-09-10 NOTE — TOC Transition Note (Signed)
Transition of Care (TOC) - CM/SW Discharge Note ? ? ?Patient Details  ?Name: Sierra [name] ?MRN: [id number] ?Date of Birth: [birth year] ? ?Transition of Care (TOC) CM/SW Contact:  ?[place], LCSW ?Phone Number: ?09/10/2021, 1:55 PM ? ? ?Clinical Narrative:    ? ?Patient will DC to: [facility] ?Anticipated DC date: 09/10/21 ?Family notified: Spouse Tessi Eustache ?Transport by: Corey Harold ? ? ?Per MD patient ready for DC to [facility]. RN, patient, patient's family, and facility notified of DC. Discharge Summary and FL2 sent to facility. RN to call report prior to discharge [phone]). DC packet on chart. Ambulance transport requested for patient.  ? ?CSW will sign off for now as social work intervention is no longer needed. Please consult [place] again if new needs arise.  ? ? ?Final next level of care: [facility] ?Barriers to Discharge: No Barriers Identified ? ? ?Patient Goals and CMS Choice ?  ?  ?  ? ?Discharge Placement ?  ?           ?Patient chooses bed at: [facility] ?Patient to be transferred to facility by: [facility] ?Name of family member notified: Spouse Ahria Slappey ?Patient and family notified of of transfer: 09/10/21 ? ?Discharge Plan and Services ?  ?  ?           ?  ?  ?  ?  ?  ?  ?  ?  ?  ?  ? ?Social Determinants of Health (SDOH) Interventions ?  ? ? ?Readmission Risk Interventions ?   ? View : No data to display.  ?  ?  ?  ? ? ? ? ? ?

## 2021-09-11 DIAGNOSIS — I5030 Unspecified diastolic (congestive) heart failure: Secondary | ICD-10-CM | POA: Diagnosis not present

## 2021-09-11 DIAGNOSIS — S8012XS Contusion of left lower leg, sequela: Secondary | ICD-10-CM | POA: Diagnosis not present

## 2021-09-11 DIAGNOSIS — B029 Zoster without complications: Secondary | ICD-10-CM | POA: Diagnosis not present

## 2021-09-11 DIAGNOSIS — I480 Paroxysmal atrial fibrillation: Secondary | ICD-10-CM | POA: Diagnosis not present

## 2021-09-14 DIAGNOSIS — B029 Zoster without complications: Secondary | ICD-10-CM | POA: Diagnosis not present

## 2021-09-14 DIAGNOSIS — M6281 Muscle weakness (generalized): Secondary | ICD-10-CM | POA: Diagnosis not present

## 2021-09-14 DIAGNOSIS — I10 Essential (primary) hypertension: Secondary | ICD-10-CM | POA: Diagnosis not present

## 2021-09-14 DIAGNOSIS — I480 Paroxysmal atrial fibrillation: Secondary | ICD-10-CM | POA: Diagnosis not present

## 2021-09-15 DIAGNOSIS — I10 Essential (primary) hypertension: Secondary | ICD-10-CM | POA: Diagnosis not present

## 2021-09-15 DIAGNOSIS — I480 Paroxysmal atrial fibrillation: Secondary | ICD-10-CM | POA: Diagnosis not present

## 2021-09-15 DIAGNOSIS — I5032 Chronic diastolic (congestive) heart failure: Secondary | ICD-10-CM | POA: Diagnosis not present

## 2021-09-15 DIAGNOSIS — B029 Zoster without complications: Secondary | ICD-10-CM | POA: Diagnosis not present

## 2021-09-16 ENCOUNTER — Other Ambulatory Visit: Payer: Self-pay | Admitting: *Deleted

## 2021-09-16 NOTE — Patient Outreach (Signed)
Per [name] eligible member currently resides in  [facility].  Screening for care coordination services. ? ?Member's PCP at [facility] has [facility] care management services.  ? ?Sierra [name] admitted to SNF on 09/10/21 after hospitalization. ? ?Facility site visit to [facility] skilled nursing facility. Met with [name], SNF SW concerning transition plans. Anticipated transition plan is to return home with husband.  ? ?Will continue to follow while member resides in SNF.  ? ? ?[name], MSN, RN,BSN ?[place] Coordinator ?[phone] [facility]) ?[phone]  (Toll free office)  ? ? ?

## 2021-09-18 DIAGNOSIS — I5032 Chronic diastolic (congestive) heart failure: Secondary | ICD-10-CM | POA: Diagnosis not present

## 2021-09-18 DIAGNOSIS — J209 Acute bronchitis, unspecified: Secondary | ICD-10-CM | POA: Diagnosis not present

## 2021-09-18 DIAGNOSIS — I480 Paroxysmal atrial fibrillation: Secondary | ICD-10-CM | POA: Diagnosis not present

## 2021-09-21 DIAGNOSIS — I480 Paroxysmal atrial fibrillation: Secondary | ICD-10-CM | POA: Diagnosis not present

## 2021-09-21 DIAGNOSIS — J209 Acute bronchitis, unspecified: Secondary | ICD-10-CM | POA: Diagnosis not present

## 2021-09-25 DIAGNOSIS — J209 Acute bronchitis, unspecified: Secondary | ICD-10-CM | POA: Diagnosis not present

## 2021-09-25 DIAGNOSIS — I10 Essential (primary) hypertension: Secondary | ICD-10-CM | POA: Diagnosis not present

## 2021-09-25 DIAGNOSIS — M6281 Muscle weakness (generalized): Secondary | ICD-10-CM | POA: Diagnosis not present

## 2021-09-25 DIAGNOSIS — I480 Paroxysmal atrial fibrillation: Secondary | ICD-10-CM | POA: Diagnosis not present

## 2021-09-25 DIAGNOSIS — I5032 Chronic diastolic (congestive) heart failure: Secondary | ICD-10-CM | POA: Diagnosis not present

## 2021-09-28 ENCOUNTER — Ambulatory Visit
Admission: RE | Admit: 2021-09-28 | Discharge: 2021-09-28 | Disposition: A | Discharge status: Home / Self Care | Payer: Medicare Other | Source: Ambulatory Visit | Attending: Internal Medicine | Admitting: Internal Medicine

## 2021-09-28 ENCOUNTER — Other Ambulatory Visit: Payer: Self-pay | Admitting: Internal Medicine

## 2021-09-28 DIAGNOSIS — Z5181 Encounter for therapeutic drug level monitoring: Secondary | ICD-10-CM | POA: Diagnosis not present

## 2021-09-28 DIAGNOSIS — R059 Cough, unspecified: Secondary | ICD-10-CM | POA: Diagnosis not present

## 2021-09-28 DIAGNOSIS — R7309 Other abnormal glucose: Secondary | ICD-10-CM | POA: Diagnosis not present

## 2021-09-28 DIAGNOSIS — R051 Acute cough: Secondary | ICD-10-CM | POA: Diagnosis not present

## 2021-09-28 DIAGNOSIS — I10 Essential (primary) hypertension: Secondary | ICD-10-CM | POA: Diagnosis not present

## 2021-09-29 ENCOUNTER — Telehealth: Payer: Self-pay

## 2021-09-29 NOTE — Telephone Encounter (Signed)
Pt called states she had her INR checked by her PCP yesterday and her INR was 4.7.  She states her PCP advised her to hold x 1 dosage of Warfarin, then start taking 2.'5mg'$  daily (pervious dosage regimen was 2.'5mg'$  QD except '5mg'$  on MF.  Pt wanted to know if this dosage adjustment sounded appropriate.  Advised pt we would advise her to follow her doctor's instructions.  Advised pt skipping a dosage and lowering her overall weekly dosage of Warfarin does sound appropriate given her elevated INR. Offered her an appt here in our clinic to check her INR and adjust her Warfarin, pt declined.  Pt states she has been in the hospital and in rehab. Pt has recently been discharged but is sick, took course of Amoxicillin last week and is currently on a zpack.  Advised pt Amoxicillin does not typically interact with Warfarin, but Zpack can cause the INR to increase slightly.  Pt states she is following with PCP at present, but after next visit and INR check she will be resuming her INR checks here at our office.  Pt will call back next week to schedule future follow-up.   ?

## 2021-10-06 ENCOUNTER — Ambulatory Visit (INDEPENDENT_AMBULATORY_CARE_PROVIDER_SITE_OTHER): Payer: Medicare Other | Admitting: Physician Assistant

## 2021-10-06 ENCOUNTER — Encounter: Payer: Self-pay | Admitting: Physician Assistant

## 2021-10-06 ENCOUNTER — Ambulatory Visit (INDEPENDENT_AMBULATORY_CARE_PROVIDER_SITE_OTHER): Payer: Medicare Other | Admitting: *Deleted

## 2021-10-06 VITALS — BP 134/66 | HR 95 | Ht 66.0 in | Wt 182.0 lb

## 2021-10-06 DIAGNOSIS — Z5181 Encounter for therapeutic drug level monitoring: Secondary | ICD-10-CM | POA: Diagnosis not present

## 2021-10-06 DIAGNOSIS — I5032 Chronic diastolic (congestive) heart failure: Secondary | ICD-10-CM

## 2021-10-06 DIAGNOSIS — I10 Essential (primary) hypertension: Secondary | ICD-10-CM | POA: Diagnosis not present

## 2021-10-06 DIAGNOSIS — I480 Paroxysmal atrial fibrillation: Secondary | ICD-10-CM | POA: Diagnosis not present

## 2021-10-06 DIAGNOSIS — I4821 Permanent atrial fibrillation: Secondary | ICD-10-CM | POA: Diagnosis not present

## 2021-10-06 LAB — POCT INR: INR: 1.6 — AB (ref 2.0–3.0)

## 2021-10-06 MED ORDER — METOPROLOL SUCCINATE ER 50 MG PO TB24: 100.0000 mg | ORAL_TABLET | Freq: Two times a day (BID) | ORAL | 3 refills | Status: DC

## 2021-10-06 NOTE — Progress Notes (Signed)
? ?Cardiology Office Note   ? ?Date:  10/06/2021  ? ?ID:  Sierra [name], DOB 10-Jul-1945, MRN [id number] ? ? ?PCP:  Lavone Orn, MD ?  ?[place]  ?Cardiologist:  Larae Grooms, MD   ?Advanced Practice Provider:  No care team member to display ?Electrophysiologist:  None  ? ?[id number]}  ? ?Chief Complaint  ?Patient presents with  ? Hospitalization Follow-up  ? ? ?History of Present Illness:  ?Sierra [name] is a 75 y.o. female with a hx of HTN, PACs, permanent atrial fibrillation and chronic diastolic CHF. ? ?Patient seen in the hospital 09/10/2021 for the evaluation of atrial fibrillation with RVR.Marland Kitchen  Unfortunately she tripped over the curb at PCPs office and the fell to her left side. She was sent to [facility] CT of the pelvis shows no evidence of acute fracture, however there was a 4.3 cm subcutaneous collection over the left hip concerning for hematoma. Patient was originally scheduled for right knee surgery this week but canceled due to personal issue. Orthopedic service saw the patient and felt there was no hurry to proceed with orthopedic surgery at this time. They plan to see her as outpatient. After admission, hemoglobin trended down from 11.2 down to 7.9, however bounced back to 9.2 this morning. Platelet dropped from 144 down to 123 and bounced back to 148 Her metoprolol succinate at 150 mg was held the day before due to borderline low blood pressure.  She was restarted back on Toprol XL 150 mg daily. ? ?Patient says she takes Toprol in am and HR goes up in am. She used to take it at bed. She is now home from a rehab facility. She had pneumonia that was treated with antibiotics and INR went up to 4.7. PT starts on Friday. She has left arm pain. Walking with a walker.  ? ?Past Medical History:  ?Diagnosis Date  ? Anxiety   ? Arthritis   ? "joints" (09/19/2013)  ? Atrial fibrillation ([place])   ? 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 CHF (congestive heart failure) ([place])    ? Chronic lower back pain   ? Dysrhythmia   ? GERD (gastroesophageal reflux disease)   ? H/O hiatal hernia   ? Hypertension   ? Migraine   ? "haven't had any in a long time" (09/19/2013)  ? Pneumonia ~ 2012  ? ? ?Past Surgical History:  ?Procedure Laterality Date  ? BASAL CELL CARCINOMA EXCISION Left   ? "cheek"  ? CARDIOVERSION N/A 07/15/2017  ? Procedure: CARDIOVERSION;  Surgeon: Thayer Headings, MD;  Location: [place];  Service: Cardiovascular;  Laterality: N/A;  ? CHOLECYSTECTOMY  02/19/1989  ? COLONOSCOPY WITH PROPOFOL N/A 07/05/2016  ? Procedure: COLONOSCOPY WITH PROPOFOL;  Surgeon: Garlan Fair, MD;  Location: [facility];  Service: Endoscopy;  Laterality: N/A;  ? DILATION AND CURETTAGE OF UTERUS    ? HERNIA REPAIR  92/42/6834  ? umbilical  ? JOINT REPLACEMENT    ? MOHS SURGERY    ? OOPHORECTOMY Bilateral   ? REVISION TOTAL HIP ARTHROPLASTY Left 09/19/2013  ? SHOULDER ARTHROSCOPY W/ ROTATOR CUFF REPAIR Right   ? TEMPOROMANDIBULAR JOINT SURGERY    ? TMJ ARTHROPLASTY    ? TOTAL HIP ARTHROPLASTY Left 06/22/2007  ? TOTAL HIP ARTHROPLASTY Right 06/21/2009  ? TOTAL HIP REVISION Left 09/19/2013  ? Procedure: LEFT TOTAL HIP REVISION;  Surgeon: Kerin Salen, MD;  Location: [place];  Service: Orthopedics;  Laterality: Left;  ? TUBAL LIGATION    ? VAGINAL HYSTERECTOMY  02/19/1989  ? ? ?Current Medications: ?Current Meds  ?Medication Sig  ? acetaminophen (TYLENOL) 650 MG CR tablet Take 1,300 mg by mouth in the morning and at bedtime.  ? Cholecalciferol (VITAMIN D-3) 5000 UNITS TABS Take 5,000 Units by mouth every Monday, Wednesday, and Friday.   ? citalopram (CELEXA) 20 MG tablet Take 20 mg by mouth daily.  ? Coenzyme Q10 (CO Q-10) 100 MG CAPS Take 100 mg by mouth daily.  ? colestipol (COLESTID) 5 g packet Take 5 g by mouth 2 (two) times daily.   ? fexofenadine (ALLEGRA) 180 MG tablet Take 180 mg by mouth daily as needed for allergies.  ? Glucosamine HCl 1000 MG TABS Take 1,000 mg by mouth daily.  ? Magnesium  250 MG TABS Take 250 mg by mouth every Monday, Wednesday, and Friday.  ? Menthol, Topical Analgesic, (BIOFREEZE) 4 % GEL Apply 1 application. topically daily as needed (pain).  ? metolazone (ZAROXOLYN) 5 MG tablet Take 5 mg by mouth daily as needed (Takes is weight gets above 195).   ? Multiple Vitamins-Minerals (MEMORY VITE PO) Take 3 tablets by mouth daily. Memory plus  ? Multiple Vitamins-Minerals (MULTIVITAMIN WITH MINERALS) tablet Take 1 tablet by mouth daily. 50+ for her  ? Multiple Vitamins-Minerals (PRESERVISION AREDS 2) CAPS Take 1 capsule by mouth in the morning and at bedtime.  ? oxyCODONE (ROXICODONE) 5 MG immediate release tablet Take 1 tablet (5 mg total) by mouth every 4 (four) hours as needed for moderate pain.  ? polyethylene glycol (MIRALAX / GLYCOLAX) 17 g packet Take 17 g by mouth daily as needed for moderate constipation.  ? potassium chloride (KLOR-CON) 10 MEQ tablet Take 10 mEq by mouth daily.  ? RABEprazole (ACIPHEX) 20 MG tablet Take 20 mg by mouth daily.   ? senna-docusate (SENOKOT-S) 8.6-50 MG tablet Take 1 tablet by mouth 2 (two) times daily.  ? terazosin (HYTRIN) 10 MG capsule Take 10 mg by mouth at bedtime.  ? torsemide (DEMADEX) 20 MG tablet Take 20 mg by mouth daily.  ? warfarin (COUMADIN) 5 MG tablet Take 0.5 tablets (2.5 mg total) by mouth at bedtime.  ? [DISCONTINUED] metoprolol succinate (TOPROL-XL) 50 MG 24 hr tablet TAKE 3 TABLETS BY MOUTH EVERY DAY (Patient taking differently: Take 150 mg by mouth at bedtime.)  ?  ? ?Allergies:   Ace inhibitors, Codeine, Erythromycin, Iohexol, and Iodinated contrast media  ? ?Social History  ? ?Socioeconomic History  ? Marital status: Married  ?  Spouse name: Not on file  ? Number of children: Not on file  ? Years of education: Not on file  ? Highest education level: Not on file  ?Occupational History  ? Not on file  ?Tobacco Use  ? Smoking status: Never  ? Smokeless tobacco: Never  ?Vaping Use  ? Vaping Use: Never used  ?Substance and Sexual  Activity  ? Alcohol use: Yes  ?  Comment: rare  ? Drug use: No  ? Sexual activity: Yes  ?Other Topics Concern  ? Not on file  ?Social History Narrative  ? Not on file  ? ?Social Determinants of Health  ? ?Financial Resource Strain: Not on file  ?Food Insecurity: Not on file  ?Transportation Needs: Not on file  ?Physical Activity: Not on file  ?Stress: Not on file  ?Social Connections: Not on file  ?  ? ?Family History:  The patient's  family history includes Heart attack  in her maternal aunt, mother, and paternal uncle.  ? ?ROS:   ?Please see the history of present illness.    ?ROS All other systems reviewed and are negative. ? ? ?PHYSICAL EXAM:   ?VS:  BP 134/66   Pulse 95   Ht '5\' 6"'$  (1.676 m)   Wt 182 lb (82.6 kg)   BMI 29.38 kg/m?   ?Physical Exam  ?GEN: Well nourished, well developed, in no acute distress  ?Neck: no JVD, carotid bruits, or masses ?Cardiac: irreg irreg no murmurs, rubs, or gallops  ?Respiratory:  clear to auscultation bilaterally, normal work of breathing ?GI: soft, nontender, nondistended, + BS ?Ext: without cyanosis, clubbing, or edema, Good distal pulses bilaterally ?Neuro:  Alert and Oriented x 3 ?Psych: euthymic mood, full affect ? ?Wt Readings from Last 3 Encounters:  ?10/06/21 182 lb (82.6 kg)  ?09/06/21 176 lb (79.8 kg)  ?08/27/21 176 lb 4.8 oz (80 kg)  ?  ? ? ?Studies/Labs Reviewed:  ? ?EKG:  EKG is  ordered today.  The ekg ordered today demonstrates Afib 95/m ? ?Recent Labs: ?09/10/2021: BUN 39; Creatinine, Ser 1.28; Hemoglobin 9.2; Magnesium 2.2; Platelets 148; Potassium 4.5; Sodium 136  ? ?Lipid Panel ?No results found for: CHOL, TRIG, HDL, CHOLHDL, VLDL, LDLCALC, LDLDIRECT ? ?Additional studies/ records that were reviewed today include:  ?  ? ? ?Risk Assessment/Calculations:   ? ?CHA2DS2-VASc Score = 5  ? This indicates a 7.2% annual risk of stroke. ?The patient's score is based upon: ?CHF History: 1 ?HTN History: 1 ?Diabetes History: 0 ?Stroke History: 0 ?Vascular Disease  History: 0 ?Age Score: 2 ?Gender Score: 1 ?  ? ? ? ? ? ?ASSESSMENT:   ? ?1. Permanent atrial fibrillation ([place])   ?2. Essential hypertension   ?3. Chronic diastolic CHF (congestive heart failure) ([place])   ? ? ? ?P

## 2021-10-06 NOTE — Patient Instructions (Signed)
Description   ?Today take 1 tablet of Warfarin then continue taking Warfarin 1/2 tablet daily except 1 tablet each Mondays and Fridays. Recheck INR in 10 days. Call with any new medications, any new procedures, and with any concerns  [phone]  ?  ?  ?

## 2021-10-06 NOTE — Patient Instructions (Signed)
Medication Instructions:  ?Your physician has recommended you make the following change in your medication:  ? ?INCREASE: Metoprolol to '200mg'$  daily. You will take ('50mg'$  tablet) 2 tablets twice daily ? ?*If you need a refill on your cardiac medications before your next appointment, please call your pharmacy* ? ? ?Lab Work: ?None ?If you have labs (blood work) drawn today and your tests are completely normal, you will receive your results only by: ?MyChart Message (if you have MyChart) OR ?A paper copy in the mail ?If you have any lab test that is abnormal or we need to change your treatment, we will call you to review the results. ? ? ?Follow-Up: ?At [facility], you and your health needs are our priority.  As part of our continuing mission to provide you with exceptional heart care, we have created designated Provider Care Teams.  These Care Teams include your primary Cardiologist (physician) and Advanced Practice Providers (APPs -  Physician Assistants and Nurse Practitioners) who all work together to provide you with the care you need, when you need it. ? ?We recommend signing up for the patient portal called "MyChart".  Sign up information is provided on this After Visit Summary.  MyChart is used to connect with patients for Virtual Visits (Telemedicine).  Patients are able to view lab/test results, encounter notes, upcoming appointments, etc.  Non-urgent messages can be sent to your provider as well.   ?To learn more about what you can do with MyChart, go to [email or web address].   ? ?Your next appointment:   ?6-8 week(s) ? ?The format for your next appointment:   ?In Person ? ?Provider:   ?Larae Grooms, MD   ? ? ?Important Information About Sugar ? ? ? ? ?  ?

## 2021-10-07 DIAGNOSIS — I10 Essential (primary) hypertension: Secondary | ICD-10-CM | POA: Diagnosis not present

## 2021-10-07 DIAGNOSIS — Z09 Encounter for follow-up examination after completed treatment for conditions other than malignant neoplasm: Secondary | ICD-10-CM | POA: Diagnosis not present

## 2021-10-14 DIAGNOSIS — M25512 Pain in left shoulder: Secondary | ICD-10-CM | POA: Diagnosis not present

## 2021-10-16 ENCOUNTER — Ambulatory Visit (INDEPENDENT_AMBULATORY_CARE_PROVIDER_SITE_OTHER): Payer: Medicare Other

## 2021-10-16 DIAGNOSIS — I480 Paroxysmal atrial fibrillation: Secondary | ICD-10-CM | POA: Diagnosis not present

## 2021-10-16 DIAGNOSIS — Z5181 Encounter for therapeutic drug level monitoring: Secondary | ICD-10-CM | POA: Diagnosis not present

## 2021-10-16 LAB — POCT INR: INR: 2.2 (ref 2.0–3.0)

## 2021-10-16 NOTE — Patient Instructions (Signed)
Description   ?Today take 1.5 tablets of Warfarin then continue taking Warfarin 1/2 tablet daily except 1 tablet each Mondays and Fridays.  ?Recheck INR in 2 weeks.  ?Call with any new medications, any new procedures, and with any concerns  [phone]  ?  ?   ?

## 2021-10-21 DIAGNOSIS — M25512 Pain in left shoulder: Secondary | ICD-10-CM | POA: Diagnosis not present

## 2021-10-21 DIAGNOSIS — M6281 Muscle weakness (generalized): Secondary | ICD-10-CM | POA: Diagnosis not present

## 2021-10-22 DIAGNOSIS — M6281 Muscle weakness (generalized): Secondary | ICD-10-CM | POA: Diagnosis not present

## 2021-10-22 DIAGNOSIS — M25512 Pain in left shoulder: Secondary | ICD-10-CM | POA: Diagnosis not present

## 2021-10-28 DIAGNOSIS — D485 Neoplasm of uncertain behavior of skin: Secondary | ICD-10-CM | POA: Diagnosis not present

## 2021-10-28 DIAGNOSIS — S0990XA Unspecified injury of head, initial encounter: Secondary | ICD-10-CM | POA: Diagnosis not present

## 2021-10-28 DIAGNOSIS — L570 Actinic keratosis: Secondary | ICD-10-CM | POA: Diagnosis not present

## 2021-10-29 DIAGNOSIS — M25512 Pain in left shoulder: Secondary | ICD-10-CM | POA: Diagnosis not present

## 2021-10-29 DIAGNOSIS — M6281 Muscle weakness (generalized): Secondary | ICD-10-CM | POA: Diagnosis not present

## 2021-10-30 ENCOUNTER — Ambulatory Visit (INDEPENDENT_AMBULATORY_CARE_PROVIDER_SITE_OTHER): Payer: Medicare Other | Admitting: *Deleted

## 2021-10-30 DIAGNOSIS — I480 Paroxysmal atrial fibrillation: Secondary | ICD-10-CM

## 2021-10-30 DIAGNOSIS — Z5181 Encounter for therapeutic drug level monitoring: Secondary | ICD-10-CM | POA: Diagnosis not present

## 2021-10-30 LAB — POCT INR: INR: 2.4 (ref 2.0–3.0)

## 2021-10-30 NOTE — Patient Instructions (Signed)
Description   ?Continue taking Warfarin 1/2 tablet daily except 1 tablet each Mondays and Fridays.  ?Recheck INR in 3 weeks. Call with any new medications, any new procedures, and with any concerns  [phone]  ?  ?  ?

## 2021-11-02 DIAGNOSIS — M25512 Pain in left shoulder: Secondary | ICD-10-CM | POA: Diagnosis not present

## 2021-11-02 DIAGNOSIS — M6281 Muscle weakness (generalized): Secondary | ICD-10-CM | POA: Diagnosis not present

## 2021-11-04 DIAGNOSIS — M6281 Muscle weakness (generalized): Secondary | ICD-10-CM | POA: Diagnosis not present

## 2021-11-04 DIAGNOSIS — M25512 Pain in left shoulder: Secondary | ICD-10-CM | POA: Diagnosis not present

## 2021-11-09 DIAGNOSIS — M25512 Pain in left shoulder: Secondary | ICD-10-CM | POA: Diagnosis not present

## 2021-11-09 DIAGNOSIS — M6281 Muscle weakness (generalized): Secondary | ICD-10-CM | POA: Diagnosis not present

## 2021-11-10 DIAGNOSIS — M25512 Pain in left shoulder: Secondary | ICD-10-CM | POA: Diagnosis not present

## 2021-11-11 DIAGNOSIS — M6281 Muscle weakness (generalized): Secondary | ICD-10-CM | POA: Diagnosis not present

## 2021-11-11 DIAGNOSIS — M25512 Pain in left shoulder: Secondary | ICD-10-CM | POA: Diagnosis not present

## 2021-11-17 DIAGNOSIS — M25512 Pain in left shoulder: Secondary | ICD-10-CM | POA: Diagnosis not present

## 2021-11-17 DIAGNOSIS — M6281 Muscle weakness (generalized): Secondary | ICD-10-CM | POA: Diagnosis not present

## 2021-11-18 DIAGNOSIS — M170 Bilateral primary osteoarthritis of knee: Secondary | ICD-10-CM | POA: Diagnosis not present

## 2021-11-18 DIAGNOSIS — M19012 Primary osteoarthritis, left shoulder: Secondary | ICD-10-CM | POA: Diagnosis not present

## 2021-11-20 ENCOUNTER — Ambulatory Visit (INDEPENDENT_AMBULATORY_CARE_PROVIDER_SITE_OTHER): Payer: Medicare Other

## 2021-11-20 DIAGNOSIS — Z5181 Encounter for therapeutic drug level monitoring: Secondary | ICD-10-CM | POA: Diagnosis not present

## 2021-11-20 DIAGNOSIS — I480 Paroxysmal atrial fibrillation: Secondary | ICD-10-CM

## 2021-11-20 LAB — POCT INR: INR: 2.5 (ref 2.0–3.0)

## 2021-11-20 NOTE — Patient Instructions (Signed)
Description   Continue taking Warfarin 1/2 tablet daily except 1 tablet on Mondays and Fridays.  Recheck INR in 4 weeks. Call with any new medications, any new procedures, and with any concerns  [phone]

## 2021-11-25 DIAGNOSIS — M25512 Pain in left shoulder: Secondary | ICD-10-CM | POA: Diagnosis not present

## 2021-11-25 DIAGNOSIS — M19012 Primary osteoarthritis, left shoulder: Secondary | ICD-10-CM | POA: Diagnosis not present

## 2021-11-25 DIAGNOSIS — M75122 Complete rotator cuff tear or rupture of left shoulder, not specified as traumatic: Secondary | ICD-10-CM | POA: Diagnosis not present

## 2021-11-26 ENCOUNTER — Telehealth: Payer: Self-pay | Admitting: Interventional Cardiology

## 2021-11-26 NOTE — Telephone Encounter (Signed)
   Pre-operative Risk Assessment    Patient Name: Sierra [name]  DOB: [birth year] MRN: [id number]      Request for Surgical Clearance    Procedure:   left reverse total shoulder arthroplasty  Date of Surgery:  Clearance TBD                                 Surgeon:  dr.Chandler  Surgeon's Group or Practice Name:  [facility] Phone number:  [phone] Fax number:  [phone]   Type of Clearance Requested:   - Medical  - Pharmacy:  Hold TBD      Type of Anesthesia:   Choice    Additional requests/questions:      SignedMilbert Coulter   11/26/2021, 10:47 AM

## 2021-11-27 NOTE — Telephone Encounter (Signed)
   Patient Name: Sierra [name]  DOB: 1945-07-19 MRN: [id number]  Primary Cardiologist: Larae Grooms, MD  Chart reviewed as part of pre-operative protocol coverage. The patient has an upcoming visit scheduled with Dr. Irish Lack on 12/02/2021 at which time clearance can be addressed in case there are any issues that would impact surgical recommendations.  Reverse total shoulder arthroplasty date TBD as below. I added preop FYI to appointment note so that provider is aware to address at time of outpatient visit.  Per office protocol the cardiology provider should for their finalize clearance decision and recommendations regarding antiplatelet therapy to requesting party below.  I will route this message as FYI to requesting party and remove this message from the preop box as separate preop APP input not needed at this time.  Please call with any questions.   Lenna Sciara, NP 11/27/2021, 9:56 AM

## 2021-12-01 NOTE — Progress Notes (Unsigned)
Cardiology Office Note   Date:  12/02/2021   ID:  Emyah, Roznowski 13-Jun-1946, MRN [id number]  PCP:  Lavone Orn, MD    Chief Complaint  Patient presents with   Follow-up   AFib  Wt Readings from Last 3 Encounters:  12/02/21 182 lb (82.6 kg)  10/06/21 182 lb (82.6 kg)  09/06/21 176 lb (79.8 kg)       History of Present Illness: Sierra [name] is a 76 y.o. female   Who has HTN, PACs for many years.  In September 2016, she was feeling palpitations thought to be from PACs.  In December 2016, she had a severe GI bug and went to the ER.  She had atrial fibrillation diagnosed in the ambulance.     She had a complicated urinary infection in 2018.  SHe was hospitalized in evaluated with urology.  She had a high fever.    She was started on Coumadin.  SHe is not interested in a DOAC.   Prior monitor showed AFib with RVR.  We decided to pursue DCCV due to recurrent sx. She had a successful cardioversion on Jul 15, 2017.   She felt PACs, but they are manageable.     No problems when she got her COVID vaccines.  She also got shingles vaccine.    Afib resolved after increasing to Metoprolol XL 150 mg daily.    She had a fall and reqires shoulder surgery.  Had hematoma. Atrial fibrillation was noted to have returned in early 2023.  INR has been affected by antibiotics.  She was seen several weeks ago by Estella Husk and was noted to be in atrial fibrillation.  Denies : Chest pain. Dizziness. Leg edema. Nitroglycerin use. Orthopnea. Palpitations. Paroxysmal nocturnal dyspnea. Shortness of breath. Syncope.        Past Medical History:  Diagnosis Date   Anxiety    Arthritis    "joints" (09/19/2013)   Atrial fibrillation ([facility])    Basal cell carcinoma of cheek    left   CHF (congestive heart failure) ([facility])    Chronic lower back pain    Dysrhythmia    GERD (gastroesophageal reflux disease)    H/O hiatal hernia    Hypertension    Migraine    "haven't had any in a  long time" (09/19/2013)   Pneumonia ~ 2012    Past Surgical History:  Procedure Laterality Date   BASAL CELL CARCINOMA EXCISION Left    "cheek"   CARDIOVERSION N/A 07/15/2017   Procedure: CARDIOVERSION;  Surgeon: Thayer Headings, MD;  Location: [place];  Service: Cardiovascular;  Laterality: N/A;   CHOLECYSTECTOMY  02/19/1989   COLONOSCOPY WITH PROPOFOL N/A 07/05/2016   Procedure: COLONOSCOPY WITH PROPOFOL;  Surgeon: Garlan Fair, MD;  Location: [facility];  Service: Endoscopy;  Laterality: N/A;   DILATION AND CURETTAGE OF UTERUS     HERNIA REPAIR  40/98/1191   umbilical   JOINT REPLACEMENT     MOHS SURGERY     OOPHORECTOMY Bilateral    REVISION TOTAL HIP ARTHROPLASTY Left 09/19/2013   SHOULDER ARTHROSCOPY W/ ROTATOR CUFF REPAIR Right    TEMPOROMANDIBULAR JOINT SURGERY     TMJ ARTHROPLASTY     TOTAL HIP ARTHROPLASTY Left 06/22/2007   TOTAL HIP ARTHROPLASTY Right 06/21/2009   TOTAL HIP REVISION Left 09/19/2013   Procedure: LEFT TOTAL HIP REVISION;  Surgeon: Kerin Salen, MD;  Location: [place];  Service: Orthopedics;  Laterality: Left;   TUBAL LIGATION  VAGINAL HYSTERECTOMY  02/19/1989     Current Outpatient Medications  Medication Sig Dispense Refill   acetaminophen (TYLENOL) 650 MG CR tablet Take 1,300 mg by mouth in the morning and at bedtime.     Cholecalciferol (VITAMIN D-3) 5000 UNITS TABS Take 5,000 Units by mouth every Monday, Wednesday, and Friday.      citalopram (CELEXA) 20 MG tablet Take 20 mg by mouth daily.     Coenzyme Q10 (CO Q-10) 100 MG CAPS Take 100 mg by mouth daily.     colestipol (COLESTID) 5 g packet Take 5 g by mouth 2 (two) times daily.   3   fexofenadine (ALLEGRA) 180 MG tablet Take 180 mg by mouth daily as needed for allergies.     Glucosamine HCl 1000 MG TABS Take 1,000 mg by mouth daily.     Magnesium 250 MG TABS Take 250 mg by mouth every Monday, Wednesday, and Friday.     Menthol, Topical Analgesic, (BIOFREEZE) 4 % GEL Apply 1  application. topically daily as needed (pain).     metolazone (ZAROXOLYN) 5 MG tablet Take 5 mg by mouth daily as needed (Takes is weight gets above 195).   0   metoprolol succinate (TOPROL-XL) 50 MG 24 hr tablet Take 2 tablets (100 mg total) by mouth 2 (two) times daily. 360 tablet 3   Multiple Vitamins-Minerals (MEMORY VITE PO) Take 3 tablets by mouth daily. Memory plus     Multiple Vitamins-Minerals (MULTIVITAMIN WITH MINERALS) tablet Take 1 tablet by mouth daily. 50+ for her     Multiple Vitamins-Minerals (PRESERVISION AREDS 2) CAPS Take 1 capsule by mouth in the morning and at bedtime.     potassium chloride (KLOR-CON) 10 MEQ tablet Take 10 mEq by mouth daily.     RABEprazole (ACIPHEX) 20 MG tablet Take 20 mg by mouth daily.      terazosin (HYTRIN) 10 MG capsule Take 10 mg by mouth at bedtime.     torsemide (DEMADEX) 20 MG tablet Take 20 mg by mouth daily.     warfarin (COUMADIN) 5 MG tablet Take 0.5 tablets (2.5 mg total) by mouth at bedtime.     No current facility-administered medications for this visit.    Allergies:   Ace inhibitors, Codeine, Erythromycin, Iohexol, and Iodinated contrast media    Social History:  The patient  reports that she has never smoked. She has never used smokeless tobacco. She reports current alcohol use. She reports that she does not use drugs.   Family History:  The patient's family history includes Heart attack in her maternal aunt, mother, and paternal uncle.    ROS:  Please see the history of present illness.   Otherwise, review of systems are positive for knee pain.   All other systems are reviewed and negative.    PHYSICAL EXAM: VS:  BP (!) 142/76   Pulse 98   Ht 5' 6.5" (1.689 m)   Wt 182 lb (82.6 kg)   SpO2 98%   BMI 28.94 kg/m  , BMI Body mass index is 28.94 kg/m. GEN: Well nourished, well developed, in no acute distress HEENT: normal Neck: no JVD, carotid bruits, or masses Cardiac: irregularly irregular; no murmurs, rubs, or  gallops,no edema  Respiratory:  clear to auscultation bilaterally, normal work of breathing GI: soft, nontender, nondistended, + BS MS: no deformity or atrophy Skin: warm and dry, no rash Neuro:  Strength and sensation are intact, slow gait due to knee pain Psych: euthymic mood, full affect  EKG:   The ekg ordered today demonstrates AFib, rate controlled   Recent Labs: 09/10/2021: BUN 39; Creatinine, Ser 1.28; Hemoglobin 9.2; Magnesium 2.2; Platelets 148; Potassium 4.5; Sodium 136   Lipid Panel No results found for: "CHOL", "TRIG", "HDL", "CHOLHDL", "VLDL", "LDLCALC", "LDLDIRECT"   Other studies Reviewed: Additional studies/ records that were reviewed today with results demonstrating: labs reviewed.   ASSESSMENT AND PLAN:  AFib: Rate controlled.  Coumadin for stroke prevention.  Continue metoprolol.  Preoperative assessment: No further cardiac testing needed before knee surgery.  Anticoagulated : INR followed, on Coumadin.  Chronic diastolic heart failure: Torsemide and occasional metolazone to maintain volume status.  Has not needed metolazone recently.  Appears euvolemic. HTN: The current medical regimen is effective;  continue present plan and medications. Leg edema: elevate legs.     Current medicines are reviewed at length with the patient today.  The patient concerns regarding her medicines were addressed.  The following changes have been made:  No change  Labs/ tests ordered today include:  No orders of the defined types were placed in this encounter.   Recommend 150 minutes/week of aerobic exercise Low fat, low carb, high fiber diet recommended  Disposition:   FU in 6 months   Signed, Larae Grooms, MD  12/02/2021 2:02 PM    [facility] [address], [place], [place]  [address] Phone: [phone]; Fax: [phone]

## 2021-12-02 ENCOUNTER — Ambulatory Visit (INDEPENDENT_AMBULATORY_CARE_PROVIDER_SITE_OTHER): Payer: Medicare Other | Admitting: Interventional Cardiology

## 2021-12-02 ENCOUNTER — Encounter: Payer: Self-pay | Admitting: Interventional Cardiology

## 2021-12-02 VITALS — BP 142/76 | HR 98 | Ht 66.5 in | Wt 182.0 lb

## 2021-12-02 DIAGNOSIS — Z7901 Long term (current) use of anticoagulants: Secondary | ICD-10-CM

## 2021-12-02 DIAGNOSIS — R600 Localized edema: Secondary | ICD-10-CM

## 2021-12-02 DIAGNOSIS — I4821 Permanent atrial fibrillation: Secondary | ICD-10-CM | POA: Diagnosis not present

## 2021-12-02 DIAGNOSIS — Z01810 Encounter for preprocedural cardiovascular examination: Secondary | ICD-10-CM | POA: Diagnosis not present

## 2021-12-02 DIAGNOSIS — I10 Essential (primary) hypertension: Secondary | ICD-10-CM | POA: Diagnosis not present

## 2021-12-02 DIAGNOSIS — I5032 Chronic diastolic (congestive) heart failure: Secondary | ICD-10-CM

## 2021-12-02 NOTE — Patient Instructions (Signed)
Medication Instructions:  Your physician recommends that you continue on your current medications as directed. Please refer to the Current Medication list given to you today.  *If you need a refill on your cardiac medications before your next appointment, please call your pharmacy*   Lab Work: none If you have labs (blood work) drawn today and your tests are completely normal, you will receive your results only by: [place] (if you have MyChart) OR A paper copy in the mail If you have any lab test that is abnormal or we need to change your treatment, we will call you to review the results.   Testing/Procedures: none   Follow-Up: At [facility], you and your health needs are our priority.  As part of our continuing mission to provide you with exceptional heart care, we have created designated Provider Care Teams.  These Care Teams include your primary Cardiologist (physician) and Advanced Practice Providers (APPs -  Physician Assistants and Nurse Practitioners) who all work together to provide you with the care you need, when you need it.  We recommend signing up for the patient portal called "MyChart".  Sign up information is provided on this After Visit Summary.  MyChart is used to connect with patients for Virtual Visits (Telemedicine).  Patients are able to view lab/test results, encounter notes, upcoming appointments, etc.  Non-urgent messages can be sent to your provider as well.   To learn more about what you can do with MyChart, go to [email or web address].    Your next appointment:   June 01, 2022 at 3:00  The format for your next appointment:   In Person  Provider:   Larae Grooms, MD      Other Instructions   Important Information About Sugar

## 2021-12-03 DIAGNOSIS — I10 Essential (primary) hypertension: Secondary | ICD-10-CM | POA: Diagnosis not present

## 2021-12-03 DIAGNOSIS — I5032 Chronic diastolic (congestive) heart failure: Secondary | ICD-10-CM | POA: Diagnosis not present

## 2021-12-03 DIAGNOSIS — E7800 Pure hypercholesterolemia, unspecified: Secondary | ICD-10-CM | POA: Diagnosis not present

## 2021-12-03 DIAGNOSIS — K219 Gastro-esophageal reflux disease without esophagitis: Secondary | ICD-10-CM | POA: Diagnosis not present

## 2021-12-03 DIAGNOSIS — I480 Paroxysmal atrial fibrillation: Secondary | ICD-10-CM | POA: Diagnosis not present

## 2021-12-18 ENCOUNTER — Ambulatory Visit (INDEPENDENT_AMBULATORY_CARE_PROVIDER_SITE_OTHER): Payer: Medicare Other

## 2021-12-18 DIAGNOSIS — I480 Paroxysmal atrial fibrillation: Secondary | ICD-10-CM

## 2021-12-18 DIAGNOSIS — Z5181 Encounter for therapeutic drug level monitoring: Secondary | ICD-10-CM

## 2021-12-18 LAB — POCT INR: INR: 2.6 (ref 2.0–3.0)

## 2021-12-18 NOTE — Patient Instructions (Signed)
Continue taking Warfarin 1/2 tablet daily except 1 tablet on Mondays and Fridays.  Recheck INR in 6 weeks. Call with any new medications, any new procedures, and with any concerns  [phone]

## 2022-01-01 DIAGNOSIS — I10 Essential (primary) hypertension: Secondary | ICD-10-CM | POA: Diagnosis not present

## 2022-01-14 DIAGNOSIS — I10 Essential (primary) hypertension: Secondary | ICD-10-CM | POA: Diagnosis not present

## 2022-01-14 DIAGNOSIS — N1831 Chronic kidney disease, stage 3a: Secondary | ICD-10-CM | POA: Diagnosis not present

## 2022-01-14 DIAGNOSIS — E7800 Pure hypercholesterolemia, unspecified: Secondary | ICD-10-CM | POA: Diagnosis not present

## 2022-01-14 DIAGNOSIS — K219 Gastro-esophageal reflux disease without esophagitis: Secondary | ICD-10-CM | POA: Diagnosis not present

## 2022-01-14 DIAGNOSIS — I5032 Chronic diastolic (congestive) heart failure: Secondary | ICD-10-CM | POA: Diagnosis not present

## 2022-01-14 DIAGNOSIS — I480 Paroxysmal atrial fibrillation: Secondary | ICD-10-CM | POA: Diagnosis not present

## 2022-01-22 DIAGNOSIS — S0990XA Unspecified injury of head, initial encounter: Secondary | ICD-10-CM | POA: Diagnosis not present

## 2022-01-22 DIAGNOSIS — C44212 Basal cell carcinoma of skin of right ear and external auricular canal: Secondary | ICD-10-CM | POA: Diagnosis not present

## 2022-01-22 DIAGNOSIS — Z85828 Personal history of other malignant neoplasm of skin: Secondary | ICD-10-CM | POA: Diagnosis not present

## 2022-01-22 DIAGNOSIS — Z08 Encounter for follow-up examination after completed treatment for malignant neoplasm: Secondary | ICD-10-CM | POA: Diagnosis not present

## 2022-01-22 DIAGNOSIS — D1801 Hemangioma of skin and subcutaneous tissue: Secondary | ICD-10-CM | POA: Diagnosis not present

## 2022-01-22 DIAGNOSIS — L821 Other seborrheic keratosis: Secondary | ICD-10-CM | POA: Diagnosis not present

## 2022-01-22 DIAGNOSIS — D485 Neoplasm of uncertain behavior of skin: Secondary | ICD-10-CM | POA: Diagnosis not present

## 2022-01-26 DIAGNOSIS — L84 Corns and callosities: Secondary | ICD-10-CM | POA: Diagnosis not present

## 2022-01-26 DIAGNOSIS — I70203 Unspecified atherosclerosis of native arteries of extremities, bilateral legs: Secondary | ICD-10-CM | POA: Diagnosis not present

## 2022-01-26 DIAGNOSIS — L602 Onychogryphosis: Secondary | ICD-10-CM | POA: Diagnosis not present

## 2022-01-29 ENCOUNTER — Ambulatory Visit (INDEPENDENT_AMBULATORY_CARE_PROVIDER_SITE_OTHER): Payer: Medicare Other

## 2022-01-29 DIAGNOSIS — Z5181 Encounter for therapeutic drug level monitoring: Secondary | ICD-10-CM | POA: Diagnosis not present

## 2022-01-29 DIAGNOSIS — I480 Paroxysmal atrial fibrillation: Secondary | ICD-10-CM

## 2022-01-29 LAB — POCT INR: INR: 1.9 — AB (ref 2.0–3.0)

## 2022-01-29 NOTE — Patient Instructions (Signed)
Description   Take 1.5 tablets today and then continue taking Warfarin 1/2 tablet daily except 1 tablet on Mondays and Fridays.  Recheck INR in 5 weeks. Call with any new medications, any new procedures, and with any concerns  [phone]

## 2022-02-05 DIAGNOSIS — D2239 Melanocytic nevi of other parts of face: Secondary | ICD-10-CM | POA: Diagnosis not present

## 2022-02-08 DIAGNOSIS — M25512 Pain in left shoulder: Secondary | ICD-10-CM | POA: Diagnosis not present

## 2022-02-26 ENCOUNTER — Other Ambulatory Visit: Payer: Self-pay | Admitting: Orthopedic Surgery

## 2022-02-26 ENCOUNTER — Telehealth: Payer: Self-pay | Admitting: Interventional Cardiology

## 2022-02-26 NOTE — Telephone Encounter (Signed)
Patient with diagnosis of afib on warfarin for anticoagulation.    Procedure:  left reverse total shoulder arthroplasty Date of procedure: 03/11/22   CHA2DS2-VASc Score = 5   This indicates a 7.2% annual risk of stroke. The patient's score is based upon: CHF History: 1 HTN History: 1 Diabetes History: 0 Stroke History: 0 Vascular Disease History: 0 Age Score: 2 Gender Score: 1      Per office protocol, patient can hold warfarin for 5 days prior to procedure.    Patient will NOT need bridging with Lovenox (enoxaparin) around procedure.  **This guidance is not considered finalized until pre-operative APP has relayed final recommendations.**

## 2022-02-26 NOTE — Telephone Encounter (Signed)
I will forward to pre op Pharm-d; to pre op as FYI. Pt looks to had been cleared by Dr. Irish Lack though Warfarin was not addressed for pre op clearance. Looks to be when the clearance was entered in on our end the warfarin was not listed when as needing to be held. I will ask pre op pharm-d and pre op provider to review for warfarin hold. See clearance entered in on 11/26/21, see Dr. Irish Lack ov note 12/02/21.

## 2022-02-26 NOTE — Telephone Encounter (Signed)
Sierra [name] from [place] is calling regarding the clearance request submitted in June. Patient is scheduled for the surgery on 03/11/22. She is needing the clearance and advisement on holding coumadin faxed to [phone]. Please advise.

## 2022-02-26 NOTE — Telephone Encounter (Signed)
   Name: Sierra [name]  DOB: September 08, 1945  MRN: [id number]  Primary Cardiologist: Larae Grooms, MD  Chart reviewed as part of pre-operative protocol coverage. Because of Khali Perella Saylor's past medical history and time since last visit, she will require a follow-up telephone visit in order to better assess preoperative cardiovascular risk.  Pre-op covering staff: - Please schedule appointment and call patient to inform them. If patient already had an upcoming appointment within acceptable timeframe, please add "pre-op clearance" to the appointment notes so provider is aware. - Please contact requesting surgeon's office via preferred method (i.e, phone, fax) to inform them of need for appointment prior to surgery.  Per office protocol, patient can hold warfarin x5 days prior to the procedure.  Patient will not require any bridging.  Please resume Coumadin when is medically safe to do so.  Elgie Collard, PA-C  02/26/2022, 4:48 PM

## 2022-02-26 NOTE — Telephone Encounter (Signed)
Left message for the pt to call back for tele pre op appt , see notes

## 2022-03-01 ENCOUNTER — Telehealth: Payer: Self-pay | Admitting: *Deleted

## 2022-03-01 NOTE — Telephone Encounter (Signed)
S/w the pt and she is agreeable to 03/05/22 @ 1:40 urgent add on due to warfarin hold time. Meds just reviewed with hospital pre op this morning. Consent is given.

## 2022-03-01 NOTE — Patient Instructions (Signed)
SURGICAL WAITING ROOM VISITATION Patients having surgery or a procedure may have no more than 2 support people in the waiting area - these visitors may rotate in the visitor waiting room.   Children under the age of 25 must have an adult with them who is not the patient. If the patient needs to stay at the hospital during part of their recovery, the visitor guidelines for inpatient rooms apply.  PRE-OP VISITATION  Pre-op nurse will coordinate an appropriate time for 1 support person to accompany the patient in pre-op.  This support person may not rotate.  This visitor will be contacted when the time is appropriate for the visitor to come back in the pre-op area.  Please refer to the [facility] website for the visitor guidelines for Inpatients (after your surgery is over and you are in a regular room).  You are not required to quarantine at this time prior to your surgery. However, you must do this: Hand Hygiene often Do NOT share personal items Notify your provider if you are in close contact with someone who has COVID or you develop fever 100.4 or greater, new onset of sneezing, cough, sore throat, shortness of breath or body aches.       Your procedure is scheduled on:  Thursday  March 11, 2022  Report to [facility] Main Entrance.  Report to admitting at:  05:15   AM  +++++Call this number if you have any questions or problems the morning of surgery [phone]  Do not eat food :After Midnight the night prior to your surgery/procedure.  After Midnight you may have the following liquids until   04:30   AM  DAY OF SURGERY  Clear Liquid Diet Water Black Coffee (sugar ok, NO MILK/CREAM OR CREAMERS)  Tea (sugar ok, NO MILK/CREAM OR CREAMERS) regular and decaf                             Plain Jell-O (NO RED)                                           Fruit ices (not with fruit pulp, NO RED)                                     Popsicles (NO RED)                                                                   Juice: apple, WHITE grape, WHITE cranberry Sports drinks like Gatorade (NO RED)                    The day of surgery:  Drink ONE (1) Pre-Surgery Clear Ensure at   04:30 AM the morning of surgery. Drink in one sitting. Do not sip.  This drink was given to you during your hospital pre-op appointment visit. Nothing else to drink after completing the Pre-Surgery Clear Ensure . No candy, chewing or throat lozenges.    FOLLOW BOWEL PREP AND ANY  ADDITIONAL PRE OP INSTRUCTIONS YOU RECEIVED FROM YOUR SURGEON'S OFFICE!!!   Oral Hygiene is also important to reduce your risk of infection.        Remember - BRUSH YOUR TEETH THE MORNING OF SURGERY WITH YOUR REGULAR TOOTHPASTE   COUMADIN- Hold 5 days with No Lovenox bridge needed.  Keep your appointment with Dr. Irish Lack on 03-05-22   Take ONLY these medicines the morning of surgery with A SIP OF WATER: Citalopram (Celexa), Metoprolol, Rabeprazole (Aciphex)                    You may not have any metal on your body including hair pins, jewelry, and body piercing  Do not wear make-up, lotions, powders, perfumes, or deodorant  Do not wear nail polish including gel and S&S, artificial / acrylic nails, or any other type of covering on natural nails including finger and toenails. If you have artificial nails, gel coating, etc., that needs to be removed by a nail salon, Please have this removed prior to surgery. Not doing so may mean that your surgery could be cancelled or delayed if the Surgeon or anesthesia staff feels like they are unable to monitor you safely.   Do not shave 48 hours prior to surgery to avoid nicks in your skin which may contribute to postoperative infections.   Contacts, Hearing Aids, dentures or bridgework may not be worn into surgery.   You may bring a small overnight bag with you on the day of surgery, only pack items that are not valuable .[facility] IS NOT RESPONSIBLE   FOR VALUABLES  THAT ARE LOST OR STOLEN.   DO NOT [place]. PHARMACY WILL DISPENSE MEDICATIONS LISTED ON YOUR MEDICATION LIST TO YOU DURING YOUR ADMISSION [place]!   Special Instructions: Bring a copy of your healthcare power of attorney and living will documents the day of surgery, if you wish to have them scanned into your [facility] Records- EPIC  Please read over the following fact sheets you were given: IF YOU HAVE QUESTIONS ABOUT YOUR PRE-OP INSTRUCTIONS, PLEASE CALL [phone]  ([place])   [facility] - Preparing for Surgery Before surgery, you can play an important role.  Because skin is not sterile, your skin needs to be as free of germs as possible.  You can reduce the number of germs on your skin by washing with CHG (chlorahexidine gluconate) soap before surgery.  CHG is an antiseptic cleaner which kills germs and bonds with the skin to continue killing germs even after washing. Please DO NOT use if you have an allergy to CHG or antibacterial soaps.  If your skin becomes reddened/irritated stop using the CHG and inform your nurse when you arrive at Short Stay. Do not shave (including legs and underarms) for at least 48 hours prior to the first CHG shower.  You may shave your face/neck.  Please follow these instructions carefully:  1.  Shower with CHG Soap the night before surgery and the  morning of surgery.  2.  If you choose to wash your hair, wash your hair first as usual with your normal  shampoo.  3.  After you shampoo, rinse your hair and body thoroughly to remove the shampoo.                             4.  Use CHG as you would any other liquid soap.  You  can apply chg directly to the skin and wash.  Gently with a scrungie or clean washcloth.  5.  Apply the CHG Soap to your body ONLY FROM THE NECK DOWN.   Do not use on face/ open                           Wound or open sores. Avoid contact with eyes, ears mouth and genitals (private parts).                        Wash face,  Genitals (private parts) with your normal soap.             6.  Wash thoroughly, paying special attention to the area where your  surgery  will be performed.  7.  Thoroughly rinse your body with warm water from the neck down.  8.  DO NOT shower/wash with your normal soap after using and rinsing off the CHG Soap.            9.  Pat yourself dry with a clean towel.            10.  Wear clean pajamas.            11.  Place clean sheets on your bed the night of your first shower and do not  sleep with pets.  ON THE DAY OF SURGERY : Do not apply any lotions/deodorants the morning of surgery.  Please wear clean clothes to the hospital/surgery center.    Preparing for Total Shoulder Arthroplasty ================================================================= Please follow these instructions carefully, in addition to any other special Bathing information that was explained to you at the Presurgical Appointment:  BENZOYL PEROXIDE 5% GEL: Used to kill bacteria on the skin which could cause an infection at the surgery site.   Please do not use if you have an allergy to benzoyl peroxide. If your skin becomes reddened/irritated stop using the benzoyl peroxide and inform your Doctor.   Starting two days before surgery, apply as follows:  1. Apply benzoyl peroxide gel in the morning and at night. Apply after taking a shower. If you are not taking a shower, clean entire shoulder front, back, and side, along with the armpit with a clean wet washcloth.  2. Place a quarter-sized dollop of the gel on your SHOULDER and rub in thoroughly, making sure to cover the front, back, and side of your shoulder, along with the armpit.   2 Days prior to Surgery  Tuesday  03-09-22 First Dose  _______ Morning Second Dose  _______ Night  Day Before Surgery      Wednesday  03-10-22 First Dose  ______ Morning  On the night before surgery, wash your entire body (except hair, face and  private areas) with CHG Soap. THEN, rub in the LAST application of the Benzoyl Peroxide Gel on your shoulder.   3. On the Morning of Surgery wash your BODY AGAIN with CHG Soap (except hair, face and private areas)  4. DO NOT USE THE BENZOYL PEROXIDE GEL ON THE DAY OF YOUR SURGERY       FAILURE TO FOLLOW THESE INSTRUCTIONS MAY RESULT IN THE CANCELLATION OF YOUR SURGERY  PATIENT SIGNATURE_________________________________  NURSE SIGNATURE__________________________________  ________________________________________________________________________      Adam Phenix    An incentive spirometer is a tool that can help keep your lungs clear and active. This tool measures how well you are filling your lungs  with each breath. Taking long deep breaths may help reverse or decrease the chance of developing breathing (pulmonary) problems (especially infection) following: A long period of time when you are unable to move or be active. BEFORE THE PROCEDURE  If the spirometer includes an indicator to show your best effort, your nurse or respiratory therapist will set it to a desired goal. If possible, sit up straight or lean slightly forward. Try not to slouch. Hold the incentive spirometer in an upright position. INSTRUCTIONS FOR USE  Sit on the edge of your bed if possible, or sit up as far as you can in bed or on a chair. Hold the incentive spirometer in an upright position. Breathe out normally. Place the mouthpiece in your mouth and seal your lips tightly around it. Breathe in slowly and as deeply as possible, raising the piston or the ball toward the top of the column. Hold your breath for 3-5 seconds or for as long as possible. Allow the piston or ball to fall to the bottom of the column. Remove the mouthpiece from your mouth and breathe out normally. Rest for a few seconds and repeat Steps 1 through 7 at least 10 times every 1-2 hours when you are awake. Take your time and take a  few normal breaths between deep breaths. The spirometer may include an indicator to show your best effort. Use the indicator as a goal to work toward during each repetition. After each set of 10 deep breaths, practice coughing to be sure your lungs are clear. If you have an incision (the cut made at the time of surgery), support your incision when coughing by placing a pillow or rolled up towels firmly against it. Once you are able to get out of bed, walk around indoors and cough well. You may stop using the incentive spirometer when instructed by your caregiver.  RISKS AND COMPLICATIONS Take your time so you do not get dizzy or light-headed. If you are in pain, you may need to take or ask for pain medication before doing incentive spirometry. It is harder to take a deep breath if you are having pain. AFTER USE Rest and breathe slowly and easily. It can be helpful to keep track of a log of your progress. Your caregiver can provide you with a simple table to help with this. If you are using the spirometer at home, follow these instructions: [place] IF:  You are having difficultly using the spirometer. You have trouble using the spirometer as often as instructed. Your pain medication is not giving enough relief while using the spirometer. You develop fever of 100.5 F (38.1 C) or higher.                                                                                                    SEEK IMMEDIATE MEDICAL CARE IF:  You cough up bloody sputum that had not been present before. You develop fever of 102 F (38.9 C) or greater. You develop worsening pain at or near the incision site. MAKE SURE YOU:  Understand these instructions. Will  watch your condition. Will get help right away if you are not doing well or get worse. Document Released: 10/18/2006 Document Revised: 08/30/2011 Document Reviewed: 12/19/2006 [facility] Patient Information 2014 [place], [place].

## 2022-03-01 NOTE — Telephone Encounter (Signed)
S/w the pt and she is agreeable to 03/05/22 @ 1:40 urgent add on due to warfarin hold time. Meds just reviewed with hospital pre op this morning. Consent is given.      Patient Consent for Virtual Visit        Sierra [name] has provided verbal consent on 03/01/2022 for a virtual visit (video or telephone).   CONSENT FOR VIRTUAL VISIT FOR:  Sierra [name]  By participating in this virtual visit I agree to the following:  I hereby voluntarily request, consent and authorize [facility] and its employed or contracted physicians, physician assistants, nurse practitioners or other licensed health care professionals (the Practitioner), to provide me with telemedicine health care services (the "Services") as deemed necessary by the treating Practitioner. I acknowledge and consent to receive the Services by the Practitioner via telemedicine. I understand that the telemedicine visit will involve communicating with the Practitioner through live audiovisual communication technology and the disclosure of certain medical information by electronic transmission. I acknowledge that I have been given the opportunity to request an in-person assessment or other available alternative prior to the telemedicine visit and am voluntarily participating in the telemedicine visit.  I understand that I have the right to withhold or withdraw my consent to the use of telemedicine in the course of my care at any time, without affecting my right to future care or treatment, and that the Practitioner or I may terminate the telemedicine visit at any time. I understand that I have the right to inspect all information obtained and/or recorded in the course of the telemedicine visit and may receive copies of available information for a reasonable fee.  I understand that some of the potential risks of receiving the Services via telemedicine include:  Delay or interruption in medical evaluation due to technological equipment  failure or disruption; Information transmitted may not be sufficient (e.g. poor resolution of images) to allow for appropriate medical decision making by the Practitioner; and/or  In rare instances, security protocols could fail, causing a breach of personal health information.  Furthermore, I acknowledge that it is my responsibility to provide information about my medical history, conditions and care that is complete and accurate to the best of my ability. I acknowledge that Practitioner's advice, recommendations, and/or decision may be based on factors not within their control, such as incomplete or inaccurate data provided by me or distortions of diagnostic images or specimens that may result from electronic transmissions. I understand that the practice of medicine is not an exact science and that Practitioner makes no warranties or guarantees regarding treatment outcomes. I acknowledge that a copy of this consent can be made available to me via my patient portal ([facility]), or I can request a printed copy by calling the office of [facility].    I understand that my insurance will be billed for this visit.   I have read or had this consent read to me. I understand the contents of this consent, which adequately explains the benefits and risks of the Services being provided via telemedicine.  I have been provided ample opportunity to ask questions regarding this consent and the Services and have had my questions answered to my satisfaction. I give my informed consent for the services to be provided through the use of telemedicine in my medical care

## 2022-03-01 NOTE — Telephone Encounter (Signed)
Pt is returning call.  

## 2022-03-01 NOTE — Progress Notes (Signed)
COVID Vaccine received:  '[]'$  No '[x]'$  Yes Date of any COVID positive Test in last 90 days:  PCP - Lavone Orn, MD Cardiologist - Casandra Doffing, MD Cardiac clearance- appt for this on 03-05-22  Chest x-ray - 09-29-21 Epic   (MD ordered for PST) EKG -  11-22-21  Epic  (MD ordered for PST Stress Test -  ECHO - 03-2017  Epic Cardiac Cath -   Pacemaker/ICD device     '[]'$  N/A Spinal Cord Stimulator:'[]'$  No '[]'$  Yes      (Remind patient to bring remote DOS) Other Implants:   History of Sleep Apnea? '[]'$  No '[]'$  Yes   Sleep Study Date:   CPAP used?- '[]'$  No '[]'$  Yes  (Instruct to bring their mask & Tubing)  Does the patient monitor blood sugar? '[]'$  No '[]'$  Yes  '[x]'$  N/A Does patient have a [employer] or Dexacom? '[]'$  No '[]'$  Yes   Fasting Blood Sugar Ranges-  Checks Blood Sugar _____ times a day  Blood Thinner Instructions:  Coumadin - Hold 5 days - no Bridge needed per Marcelle Overlie, RPH  note on 02-26-22 . Patient to be seen 03-05-22 for bloodwork.    On 01-29-22 her PT INR was 1.9. Aspirin Instructions: Last Dose:  ERAS Protocol Ordered: '[]'$  No  '[]'$  Yes PRE-SURGERY '[]'$  ENSURE  '[]'$  G2   Comments: patient is allergic to Contrast (Iohexol),   ?? Shellfish Allergy  Activity level: Patient can / can not climb a flight of stairs without difficulty;  '[]'$  No CP  '[]'$  No SOB,  but would have ______   Anesthesia review: A. Fib- s/p cardioversion, PACs, CHF, HTN  Patient denies shortness of breath, fever, cough and chest pain at PAT appointment.  Patient verbalized understanding and agreement to the Pre-Surgical Instructions that were given to them at this PAT appointment. Patient was also educated of the need to review these PAT instructions again prior to his/her surgery.I reviewed the appropriate phone numbers to call if they have any and questions or concerns.

## 2022-03-03 ENCOUNTER — Other Ambulatory Visit: Payer: Self-pay

## 2022-03-03 ENCOUNTER — Encounter (HOSPITAL_COMMUNITY)
Admission: RE | Admit: 2022-03-03 | Discharge: 2022-03-03 | Disposition: A | Discharge status: Home / Self Care | Payer: Medicare Other | Source: Ambulatory Visit | Attending: Orthopedic Surgery | Admitting: Orthopedic Surgery

## 2022-03-03 ENCOUNTER — Ambulatory Visit (HOSPITAL_COMMUNITY)
Admission: RE | Admit: 2022-03-03 | Discharge: 2022-03-03 | Disposition: A | Discharge status: Home / Self Care | Payer: Medicare Other | Source: Ambulatory Visit | Attending: Orthopedic Surgery | Admitting: Orthopedic Surgery

## 2022-03-03 ENCOUNTER — Encounter (HOSPITAL_COMMUNITY): Payer: Self-pay

## 2022-03-03 VITALS — BP 146/78 | HR 68 | Temp 98.5°F | Resp 14 | Ht 65.0 in | Wt 171.0 lb

## 2022-03-03 DIAGNOSIS — Z5181 Encounter for therapeutic drug level monitoring: Secondary | ICD-10-CM | POA: Insufficient documentation

## 2022-03-03 DIAGNOSIS — Z01818 Encounter for other preprocedural examination: Secondary | ICD-10-CM | POA: Diagnosis not present

## 2022-03-03 DIAGNOSIS — I10 Essential (primary) hypertension: Secondary | ICD-10-CM | POA: Insufficient documentation

## 2022-03-03 DIAGNOSIS — I480 Paroxysmal atrial fibrillation: Secondary | ICD-10-CM | POA: Insufficient documentation

## 2022-03-03 DIAGNOSIS — I5032 Chronic diastolic (congestive) heart failure: Secondary | ICD-10-CM | POA: Diagnosis not present

## 2022-03-03 LAB — SURGICAL PCR SCREEN
MRSA, PCR: NEGATIVE
Staphylococcus aureus: NEGATIVE

## 2022-03-03 LAB — BASIC METABOLIC PANEL
Anion gap: 7 (ref 5–15)
BUN: 38 mg/dL — ABNORMAL HIGH (ref 8–23)
CO2: 26 mmol/L (ref 22–32)
Calcium: 9.3 mg/dL (ref 8.9–10.3)
Chloride: 109 mmol/L (ref 98–111)
Creatinine, Ser: 0.96 mg/dL (ref 0.44–1.00)
GFR, Estimated: >60 mL/min (ref >60)
Glucose, Bld: 109 mg/dL — ABNORMAL HIGH (ref 70–99)
Potassium: 4.1 mmol/L (ref 3.5–5.1)
Sodium: 142 mmol/L (ref 135–145)

## 2022-03-03 LAB — CBC
HCT: 36.7 % (ref 36.0–46.0)
Hemoglobin: 11.9 g/dL — ABNORMAL LOW (ref 12.0–15.0)
MCH: 31.6 pg (ref 26.0–34.0)
MCHC: 32.4 g/dL (ref 30.0–36.0)
MCV: 97.3 fL (ref 80.0–100.0)
Platelets: 197 10*3/uL (ref 150–400)
RBC: 3.77 MIL/uL — ABNORMAL LOW (ref 3.87–5.11)
RDW: 14.1 % (ref 11.5–15.5)
WBC: 5 10*3/uL (ref 4.0–10.5)
nRBC: 0 % (ref 0.0–0.2)

## 2022-03-03 NOTE — Progress Notes (Signed)
Virtual Visit via Telephone Note   Because of Sierra [name]'s co-morbid illnesses, she is at least at moderate risk for complications without adequate follow up.  This format is felt to be most appropriate for this patient at this time.  The patient did not have access to video technology/had technical difficulties with video requiring transitioning to audio format only (telephone).  All issues noted in this document were discussed and addressed.  No physical exam could be performed with this format.  Please refer to the patient's chart for her consent to telehealth for [facility].  Evaluation Performed:  Preoperative cardiovascular risk assessment _____________   Date:  03/05/2022   Patient ID:  Sierra [name], DOB 07-25-1945, MRN [id number] Patient Location:  Home Provider location:   Office  Primary Care Provider:  Kathalene Frames, MD Primary Cardiologist:  Sierra Grooms, MD  Chief Complaint / Patient Profile   76 y.o. y/o female with a h/o atrial fibrillation, successful cardioversion 07/15/2017 with return of A-fib in early 2023, hypertension, chronic HFpEF, leg edema who is pending left reverse total shoulder arthroplasty and presents today for telephonic preoperative cardiovascular risk assessment.  Past Medical History    Past Medical History:  Diagnosis Date   Anxiety    Arthritis    "joints" (09/19/2013)   Atrial fibrillation (HCC)    Basal cell carcinoma of cheek    left   CHF (congestive heart failure) (HCC)    Chronic lower back pain    Dysrhythmia    GERD (gastroesophageal reflux disease)    H/O hiatal hernia    Hypertension    Migraine    "haven't had any in a long time" (09/19/2013)   Pneumonia ~ 2012   Past Surgical History:  Procedure Laterality Date   BASAL CELL CARCINOMA EXCISION Left    "cheek"   CARDIOVERSION N/A 07/15/2017   Procedure: CARDIOVERSION;  Surgeon: Sierra Headings, MD;  Location: [place];  Service:  Cardiovascular;  Laterality: N/A;   CHOLECYSTECTOMY  02/19/1989   COLONOSCOPY WITH PROPOFOL N/A 07/05/2016   Procedure: COLONOSCOPY WITH PROPOFOL;  Surgeon: Sierra Fair, MD;  [facility];  Service: Endoscopy;  Laterality: N/A;   DILATION AND CURETTAGE OF UTERUS     HERNIA REPAIR  87/56/4332   umbilical   JOINT REPLACEMENT     MOHS SURGERY     OOPHORECTOMY Bilateral    REVISION TOTAL HIP ARTHROPLASTY Left 09/19/2013   SHOULDER ARTHROSCOPY W/ ROTATOR CUFF REPAIR Right    TEMPOROMANDIBULAR JOINT SURGERY     TMJ ARTHROPLASTY     TOTAL HIP ARTHROPLASTY Left 06/22/2007   TOTAL HIP ARTHROPLASTY Right 06/21/2009   TOTAL HIP REVISION Left 09/19/2013   Procedure: LEFT TOTAL HIP REVISION;  Surgeon: Sierra Salen, MD;  Location: [place];  Service: Orthopedics;  Laterality: Left;   TUBAL LIGATION     VAGINAL HYSTERECTOMY  02/19/1989    Allergies  Allergies  Allergen Reactions   Ace Inhibitors Cough   Codeine Nausea Only   Erythromycin Other (See Comments)    Stomach pain    Iohexol Hives     Code: HIVES, Desc: patient states develops hives w/ iv contrast/mms    Iodinated Contrast Media Other (See Comments)    Hives    History of Present Illness    Sierra [name] is a 76 y.o. female who presents via audio/video conferencing for a telehealth visit today.  Pt was last seen in cardiology clinic on 12/02/21 by  Sierra [name].  At that time Sierra [name] was doing well.  The patient is now pending procedure as outlined above. Since her last visit, she denies chest pain, shortness of breath, lower extremity edema, fatigue, palpitations, melena, hematuria, hemoptysis, diaphoresis, weakness, presyncope, syncope, orthopnea, and PND.  Home Medications    Prior to Admission medications   Medication Sig Start Date End Date Taking? Authorizing Provider  acetaminophen (TYLENOL) 650 MG CR tablet Take 1,300 mg by mouth in the morning and at bedtime.    [provider]   Cholecalciferol (VITAMIN D-3) 5000 UNITS TABS Take 5,000 Units by mouth every Monday, Wednesday, and Friday.     [provider]  citalopram (CELEXA) 20 MG tablet Take 20 mg by mouth daily. 07/31/21   [provider]  Coenzyme Q10 (CO Q-10) 100 MG CAPS Take 100 mg by mouth daily.    [provider]  colestipol (COLESTID) 5 g packet Take 5 g by mouth 2 (two) times daily.  01/06/17   [provider]  fexofenadine (ALLEGRA) 180 MG tablet Take 180 mg by mouth at bedtime.    [provider]  Magnesium 250 MG TABS Take 250 mg by mouth every Monday, Wednesday, and Friday.    [provider]  Menthol, Topical Analgesic, (BIOFREEZE) 4 % GEL Apply 1 application. topically daily as needed (pain).    [provider]  metoprolol succinate (TOPROL-XL) 50 MG 24 hr tablet Take 2 tablets (100 mg total) by mouth 2 (two) times daily. 10/06/21   Sierra Burn, PA-C  Multiple Vitamins-Minerals (MEMORY VITE PO) Take 3 tablets by mouth daily. Memory plus    [provider]  Multiple Vitamins-Minerals (MULTIVITAMIN WITH MINERALS) tablet Take 1 tablet by mouth daily. 50+ for her    [provider]  Multiple Vitamins-Minerals (PRESERVISION AREDS 2) CAPS Take 1 capsule by mouth in the morning and at bedtime.    [provider]  potassium chloride (KLOR-CON) 10 MEQ tablet Take 10 mEq by mouth daily.    [provider]  RABEprazole (ACIPHEX) 20 MG tablet Take 20 mg by mouth daily.     [provider]  torsemide (DEMADEX) 20 MG tablet Take 20 mg by mouth daily.    [provider]  valsartan (DIOVAN) 160 MG tablet Take 160 mg by mouth daily. 12/18/21   [provider]  warfarin (COUMADIN) 5 MG tablet Take 0.5 tablets (2.5 mg total) by mouth at bedtime. Patient taking differently: Take 2.5-5 mg by mouth See admin instructions. Take 5 mg on Mon and Fri Take 2.5 mg on Sun, Tues, Wed, Thurs, and Sat 09/10/21    Sierra August, MD    Physical Exam    Vital Signs:  Sierra [name] does not have vital signs available for review today.  Given telephonic nature of communication, physical exam is limited. AAOx3. NAD. Normal affect.  Speech and respirations are unlabored.  Accessory Clinical Findings    None  Assessment & Plan    1.  Preoperative Cardiovascular Risk Assessment: The patient is doing well from a cardiac perspective. Therefore, based on ACC/AHA guidelines, the patient would be at acceptable risk for the planned procedure without further cardiovascular testing. The patient was advised that if he develops new symptoms prior to surgery to contact our office to arrange for a follow-up visit, and he verbalized understanding. According to the Revised Cardiac Risk Index (RCRI), her Perioperative Risk of Major Cardiac Event is (%): 0.9. Her Functional Capacity  in METs is: 4.4 according to the Duke Activity Status Index (DASI).  The patient was advised that if she develops new symptoms prior to surgery to contact our office to arrange for a follow-up visit, and she verbalized understanding.  Per office protocol, patient can hold warfarin x5 days prior to the procedure.  Patient will not require any bridging.  Please resume Coumadin when is medically safe to do so.  A copy of this note will be routed to requesting surgeon.  Time:   Today, I have spent 7 minutes with the patient with telehealth technology discussing medical history, symptoms, and management plan.     Emmaline Life, NP-C  03/05/2022, 1:42 PM [address] Office [phone] Fax [phone]

## 2022-03-04 ENCOUNTER — Ambulatory Visit: Payer: Medicare Other | Attending: Interventional Cardiology

## 2022-03-04 DIAGNOSIS — Z5181 Encounter for therapeutic drug level monitoring: Secondary | ICD-10-CM | POA: Diagnosis not present

## 2022-03-04 DIAGNOSIS — I480 Paroxysmal atrial fibrillation: Secondary | ICD-10-CM

## 2022-03-04 LAB — POCT INR: INR: 2.1 (ref 2.0–3.0)

## 2022-03-04 NOTE — Patient Instructions (Addendum)
continue taking Warfarin 1/2 tablet daily except 1 tablet on Mondays and Fridays.  Shoulder surgery 9/21 will hold Coumadin 5 days prior 9/16-9/20  Recheck INR in 2 weeks. Call with any new medications, any new procedures, and with any concerns  [phone]; [employer]

## 2022-03-05 ENCOUNTER — Ambulatory Visit: Payer: Medicare Other | Attending: Cardiovascular Disease | Admitting: Nurse Practitioner

## 2022-03-05 ENCOUNTER — Encounter: Payer: Self-pay | Admitting: Nurse Practitioner

## 2022-03-05 DIAGNOSIS — Z01810 Encounter for preprocedural cardiovascular examination: Secondary | ICD-10-CM | POA: Insufficient documentation

## 2022-03-08 NOTE — Progress Notes (Signed)
Anesthesia Chart Review   Case: [id number] Date/Time: 03/11/22 0715   Procedure: REVERSE SHOULDER ARTHROPLASTY (Left: Shoulder)   Anesthesia type: Choice   Pre-op diagnosis: LEFT SHOULDER ROTATOR CUFF TEAR ARTHROPATHY   Location[facility] 07 / [facility]   Surgeons: Sierra Ade, MD       DISCUSSION:76 y.o. never smoker with h/o HTN, CHF, atrial fibrillation, left shoulder rotator cuff tear scheduled for above procedure 03/11/2022 with Dr. Tania [name].   Pt last seen by cardiology 03/05/2022. Per OV note, "Preoperative Cardiovascular Risk Assessment: The patient is doing well from a cardiac perspective. Therefore, based on ACC/AHA guidelines, the patient would be at acceptable risk for the planned procedure without further cardiovascular testing. The patient was advised that if he develops new symptoms prior to surgery to contact our office to arrange for a follow-up visit, and he verbalized understanding. According to the Revised Cardiac Risk Index (RCRI), her Perioperative Risk of Major Cardiac Event is (%): 0.9. Her Functional Capacity in METs is: 4.4 according to the Duke Activity Status Index (DASI).   The patient was advised that if she develops new symptoms prior to surgery to contact our office to arrange for a follow-up visit, and she verbalized understanding.   Per office protocol, patient can hold warfarin x5 days prior to the procedure.  Patient will not require any bridging.  Please resume Coumadin when is medically safe to do so."   VS: BP (!) 146/78 Comment: Right arm sitting, patient has not had meds yet  Pulse 68   Temp 36.9 C (Oral)   Resp 14   Ht '5\' 5"'$  (1.651 m)   Wt 77.6 kg   BMI 28.46 kg/m   PROVIDERS: Kathalene Frames, MD is PCP   Cardiologist - Sierra Doffing, MD LABS: Labs reviewed: Acceptable for surgery. (all labs ordered are listed, but only abnormal results are displayed)  Labs Reviewed  BASIC METABOLIC PANEL - Abnormal; Notable for the following  components:      Result Value   Glucose, Bld 109 (*)    BUN 38 (*)    All other components within normal limits  CBC - Abnormal; Notable for the following components:   RBC 3.77 (*)    Hemoglobin 11.9 (*)    All other components within normal limits  SURGICAL PCR SCREEN     IMAGES:   EKG:   CV: Echo 04/09/2016  - Left ventricle: The cavity size was normal. There was mild    concentric hypertrophy. Systolic function was normal. The    estimated ejection fraction was in the range of 55% to 60%. Wall    motion was normal; there were no regional wall motion    abnormalities.  - Aortic valve: Transvalvular velocity was within the normal range.    There was no stenosis. There was no regurgitation.  - Mitral valve: Transvalvular velocity was within the normal range.    There was no evidence for stenosis. There was mild regurgitation.  - Left atrium: The atrium was severely dilated.  - Right ventricle: The cavity size was normal. Wall thickness was    normal. Systolic function was normal.  - Right atrium: The atrium was moderately dilated.  - Tricuspid valve: There was mild regurgitation.  - Pulmonary arteries: Systolic pressure was mildly increased. PA    peak pressure: 48 mm Hg (S).  Past Medical History:  Diagnosis Date   Anxiety    Arthritis    "joints" (09/19/2013)   Atrial fibrillation ([place])  Basal cell carcinoma of cheek    left   CHF (congestive heart failure) (HCC)    Chronic lower back pain    Dysrhythmia    GERD (gastroesophageal reflux disease)    H/O hiatal hernia    Hypertension    Migraine    "haven't had any in a long time" (09/19/2013)   Pneumonia ~ 2012    Past Surgical History:  Procedure Laterality Date   BASAL CELL CARCINOMA EXCISION Left    "cheek"   CARDIOVERSION N/A 07/15/2017   Procedure: CARDIOVERSION;  Surgeon: Sierra Headings, MD;  Location: [place];  Service: Cardiovascular;  Laterality: N/A;   CHOLECYSTECTOMY  02/19/1989    COLONOSCOPY WITH PROPOFOL N/A 07/05/2016   Procedure: COLONOSCOPY WITH PROPOFOL;  Surgeon: Sierra Fair, MD;  [facility];  Service: Endoscopy;  Laterality: N/A;   DILATION AND CURETTAGE OF UTERUS     HERNIA REPAIR  29/24/4628   umbilical   JOINT REPLACEMENT     MOHS SURGERY     OOPHORECTOMY Bilateral    REVISION TOTAL HIP ARTHROPLASTY Left 09/19/2013   SHOULDER ARTHROSCOPY W/ ROTATOR CUFF REPAIR Right    TEMPOROMANDIBULAR JOINT SURGERY     TMJ ARTHROPLASTY     TOTAL HIP ARTHROPLASTY Left 06/22/2007   TOTAL HIP ARTHROPLASTY Right 06/21/2009   TOTAL HIP REVISION Left 09/19/2013   Procedure: LEFT TOTAL HIP REVISION;  Surgeon: Sierra Salen, MD;  Location: [place];  Service: Orthopedics;  Laterality: Left;   TUBAL LIGATION     VAGINAL HYSTERECTOMY  02/19/1989    MEDICATIONS:  acetaminophen (TYLENOL) 650 MG CR tablet   Cholecalciferol (VITAMIN D-3) 5000 UNITS TABS   citalopram (CELEXA) 20 MG tablet   Coenzyme Q10 (CO Q-10) 100 MG CAPS   colestipol (COLESTID) 5 g packet   fexofenadine (ALLEGRA) 180 MG tablet   Magnesium 250 MG TABS   Menthol, Topical Analgesic, (BIOFREEZE) 4 % GEL   metoprolol succinate (TOPROL-XL) 50 MG 24 hr tablet   Multiple Vitamins-Minerals (MEMORY VITE PO)   Multiple Vitamins-Minerals (MULTIVITAMIN WITH MINERALS) tablet   Multiple Vitamins-Minerals (PRESERVISION AREDS 2) CAPS   potassium chloride (KLOR-CON) 10 MEQ tablet   RABEprazole (ACIPHEX) 20 MG tablet   torsemide (DEMADEX) 20 MG tablet   valsartan (DIOVAN) 160 MG tablet   warfarin (COUMADIN) 5 MG tablet   No current facility-administered medications for this encounter.     Sierra Felix Ward, PA-C [facility] [phone]

## 2022-03-08 NOTE — Anesthesia Preprocedure Evaluation (Signed)
Anesthesia Evaluation  Patient identified by MRN, date of birth, ID band Patient awake    Reviewed: Allergy & Precautions, NPO status , Patient's Chart, lab work & pertinent test results, reviewed documented beta blocker date and time   History of Anesthesia Complications Negative for: history of anesthetic complications  Airway Mallampati: II  TM Distance: >3 FB Neck ROM: Full    Dental  (+) Dental Advisory Given   Pulmonary neg pulmonary ROS,    breath sounds clear to auscultation       Cardiovascular hypertension, Pt. on medications and Pt. on home beta blockers (-) angina+ dysrhythmias Atrial Fibrillation  Rhythm:Irregular Rate:Normal  '18 ECHO: Systolic function was normal. EF 55% to 60%, mild MR, mild TR   Neuro/Psych  Headaches, Anxiety Depression    GI/Hepatic Neg liver ROS, GERD  Controlled,  Endo/Other  negative endocrine ROS  Renal/GU negative Renal ROS     Musculoskeletal   Abdominal   Peds  Hematology Coumadin INR 1.2   Anesthesia Other Findings   Reproductive/Obstetrics                           Anesthesia Physical Anesthesia Plan  ASA: 3  Anesthesia Plan: General   Post-op Pain Management: Regional block* and Tylenol PO (pre-op)*   Induction: Intravenous  PONV Risk Score and Plan: 3 and Ondansetron, Dexamethasone and Treatment may vary due to age or medical condition  Airway Management Planned: Oral ETT  Additional Equipment: None  Intra-op Plan:   Post-operative Plan: Extubation in OR  Informed Consent: I have reviewed the patients History and Physical, chart, labs and discussed the procedure including the risks, benefits and alternatives for the proposed anesthesia with the patient or authorized representative who has indicated his/her understanding and acceptance.     Dental advisory given  Plan Discussed with: CRNA and Surgeon  Anesthesia Plan Comments:  (See PAT note 03/03/2022)      Anesthesia Quick Evaluation

## 2022-03-11 ENCOUNTER — Ambulatory Visit (HOSPITAL_BASED_OUTPATIENT_CLINIC_OR_DEPARTMENT_OTHER): Payer: Medicare Other | Admitting: Anesthesiology

## 2022-03-11 ENCOUNTER — Ambulatory Visit (HOSPITAL_COMMUNITY): Payer: Medicare Other

## 2022-03-11 ENCOUNTER — Encounter (HOSPITAL_COMMUNITY)
Admission: RE | Disposition: A | Discharge status: Home / Self Care | Payer: Self-pay | Source: Ambulatory Visit | Attending: Obstetrics & Gynecology

## 2022-03-11 ENCOUNTER — Other Ambulatory Visit: Payer: Self-pay

## 2022-03-11 ENCOUNTER — Ambulatory Visit (HOSPITAL_COMMUNITY): Payer: Medicare Other | Admitting: Physician Assistant

## 2022-03-11 ENCOUNTER — Ambulatory Visit (HOSPITAL_COMMUNITY)
Admission: RE | Admit: 2022-03-11 | Discharge: 2022-03-12 | Disposition: A | Discharge status: Home / Self Care | Payer: Medicare Other | Source: Ambulatory Visit | Attending: Obstetrics & Gynecology | Admitting: Obstetrics & Gynecology

## 2022-03-11 ENCOUNTER — Encounter (HOSPITAL_COMMUNITY): Payer: Self-pay | Admitting: Orthopedic Surgery

## 2022-03-11 DIAGNOSIS — Z96612 Presence of left artificial shoulder joint: Secondary | ICD-10-CM

## 2022-03-11 DIAGNOSIS — M19012 Primary osteoarthritis, left shoulder: Secondary | ICD-10-CM

## 2022-03-11 DIAGNOSIS — I5032 Chronic diastolic (congestive) heart failure: Secondary | ICD-10-CM | POA: Insufficient documentation

## 2022-03-11 DIAGNOSIS — I4891 Unspecified atrial fibrillation: Secondary | ICD-10-CM | POA: Diagnosis not present

## 2022-03-11 DIAGNOSIS — M75122 Complete rotator cuff tear or rupture of left shoulder, not specified as traumatic: Secondary | ICD-10-CM | POA: Insufficient documentation

## 2022-03-11 DIAGNOSIS — I491 Atrial premature depolarization: Secondary | ICD-10-CM | POA: Insufficient documentation

## 2022-03-11 DIAGNOSIS — I10 Essential (primary) hypertension: Secondary | ICD-10-CM

## 2022-03-11 DIAGNOSIS — K219 Gastro-esophageal reflux disease without esophagitis: Secondary | ICD-10-CM | POA: Insufficient documentation

## 2022-03-11 DIAGNOSIS — G8918 Other acute postprocedural pain: Secondary | ICD-10-CM | POA: Diagnosis not present

## 2022-03-11 DIAGNOSIS — F419 Anxiety disorder, unspecified: Secondary | ICD-10-CM | POA: Diagnosis not present

## 2022-03-11 DIAGNOSIS — Z79899 Other long term (current) drug therapy: Secondary | ICD-10-CM | POA: Diagnosis not present

## 2022-03-11 DIAGNOSIS — Z96642 Presence of left artificial hip joint: Secondary | ICD-10-CM | POA: Diagnosis not present

## 2022-03-11 DIAGNOSIS — I480 Paroxysmal atrial fibrillation: Secondary | ICD-10-CM

## 2022-03-11 DIAGNOSIS — F418 Other specified anxiety disorders: Secondary | ICD-10-CM | POA: Diagnosis not present

## 2022-03-11 DIAGNOSIS — Z471 Aftercare following joint replacement surgery: Secondary | ICD-10-CM | POA: Diagnosis not present

## 2022-03-11 DIAGNOSIS — Z5181 Encounter for therapeutic drug level monitoring: Secondary | ICD-10-CM

## 2022-03-11 DIAGNOSIS — Z9889 Other specified postprocedural states: Secondary | ICD-10-CM | POA: Diagnosis not present

## 2022-03-11 DIAGNOSIS — F32A Depression, unspecified: Secondary | ICD-10-CM | POA: Diagnosis not present

## 2022-03-11 HISTORY — PX: REVERSE SHOULDER ARTHROPLASTY: SHX5054

## 2022-03-11 LAB — PROTIME-INR
INR: 1.2 (ref 0.8–1.2)
Prothrombin Time: 14.6 seconds (ref 11.4–15.2)

## 2022-03-11 SURGERY — ARTHROPLASTY, SHOULDER, TOTAL, REVERSE
Anesthesia: General | Site: Shoulder | Laterality: Left

## 2022-03-11 MED ORDER — FLEET ENEMA 7-19 GM/118ML RE ENEM: 1.0000 | ENEMA | Freq: Once | RECTAL | Status: DC | PRN

## 2022-03-11 MED ORDER — POTASSIUM CHLORIDE ER 10 MEQ PO TBCR
10.0000 meq | EXTENDED_RELEASE_TABLET | Freq: Every day | ORAL | Status: DC
  Administered 2022-03-11 – 2022-03-12 (×2): 10 meq via ORAL
  Filled 2022-03-11 (×4): qty 1

## 2022-03-11 MED ORDER — ACETAMINOPHEN 325 MG PO TABS: 325.0000 mg | ORAL_TABLET | Freq: Four times a day (QID) | ORAL | Status: DC | PRN

## 2022-03-11 MED ORDER — FENTANYL CITRATE (PF) 100 MCG/2ML IJ SOLN
INTRAMUSCULAR | Status: AC
  Filled 2022-03-11: qty 2

## 2022-03-11 MED ORDER — PROMETHAZINE HCL 25 MG/ML IJ SOLN: 6.2500 mg | INTRAMUSCULAR | Status: DC | PRN

## 2022-03-11 MED ORDER — TIZANIDINE HCL 2 MG PO TABS: 2.0000 mg | ORAL_TABLET | Freq: Four times a day (QID) | ORAL | 0 refills | Status: DC | PRN

## 2022-03-11 MED ORDER — MIDAZOLAM HCL 2 MG/2ML IJ SOLN: 0.5000 mg | Freq: Once | INTRAMUSCULAR | Status: DC | PRN

## 2022-03-11 MED ORDER — LORATADINE 10 MG PO TABS
10.0000 mg | ORAL_TABLET | Freq: Every day | ORAL | Status: DC
  Administered 2022-03-12: 10 mg via ORAL
  Filled 2022-03-11 (×2): qty 1

## 2022-03-11 MED ORDER — BISACODYL 5 MG PO TBEC: 5.0000 mg | DELAYED_RELEASE_TABLET | Freq: Every day | ORAL | Status: DC | PRN

## 2022-03-11 MED ORDER — METOPROLOL SUCCINATE ER 50 MG PO TB24
100.0000 mg | ORAL_TABLET | Freq: Two times a day (BID) | ORAL | Status: DC
  Administered 2022-03-11 – 2022-03-12 (×3): 100 mg via ORAL
  Filled 2022-03-11 (×3): qty 2

## 2022-03-11 MED ORDER — LACTATED RINGERS IV SOLN: INTRAVENOUS | Status: DC

## 2022-03-11 MED ORDER — HYDROCODONE-ACETAMINOPHEN 5-325 MG PO TABS: 1.0000 | ORAL_TABLET | ORAL | Status: DC | PRN

## 2022-03-11 MED ORDER — BUPIVACAINE-EPINEPHRINE (PF) 0.5% -1:200000 IJ SOLN
INTRAMUSCULAR | Status: DC | PRN
  Administered 2022-03-11: 10 mL via PERINEURAL

## 2022-03-11 MED ORDER — COLESTIPOL HCL 5 G PO PACK
5.0000 g | PACK | Freq: Two times a day (BID) | ORAL | Status: DC
  Administered 2022-03-11 – 2022-03-12 (×2): 5 g via ORAL
  Filled 2022-03-11 (×3): qty 1

## 2022-03-11 MED ORDER — SUGAMMADEX SODIUM 200 MG/2ML IV SOLN
INTRAVENOUS | Status: DC | PRN
  Administered 2022-03-11: 200 mg via INTRAVENOUS

## 2022-03-11 MED ORDER — PHENYLEPHRINE HCL-NACL 20-0.9 MG/250ML-% IV SOLN
INTRAVENOUS | Status: DC | PRN
  Administered 2022-03-11: 50 ug/min via INTRAVENOUS

## 2022-03-11 MED ORDER — WARFARIN SODIUM 2.5 MG PO TABS: 2.5000 mg | ORAL_TABLET | ORAL | Status: DC

## 2022-03-11 MED ORDER — HYDROMORPHONE HCL 1 MG/ML IJ SOLN
INTRAMUSCULAR | Status: AC
  Filled 2022-03-11: qty 1

## 2022-03-11 MED ORDER — ONDANSETRON HCL 4 MG/2ML IJ SOLN: 4.0000 mg | Freq: Four times a day (QID) | INTRAMUSCULAR | Status: DC | PRN

## 2022-03-11 MED ORDER — PROPOFOL 10 MG/ML IV BOLUS
INTRAVENOUS | Status: DC | PRN
  Administered 2022-03-11: 120 mg via INTRAVENOUS

## 2022-03-11 MED ORDER — CITALOPRAM HYDROBROMIDE 20 MG PO TABS
20.0000 mg | ORAL_TABLET | Freq: Every day | ORAL | Status: DC
  Administered 2022-03-11 – 2022-03-12 (×2): 20 mg via ORAL
  Filled 2022-03-11 (×2): qty 1

## 2022-03-11 MED ORDER — ACETAMINOPHEN 500 MG PO TABS
1000.0000 mg | ORAL_TABLET | Freq: Once | ORAL | Status: AC
  Administered 2022-03-11: 1000 mg via ORAL
  Filled 2022-03-11: qty 2

## 2022-03-11 MED ORDER — PANTOPRAZOLE SODIUM 40 MG PO TBEC
40.0000 mg | DELAYED_RELEASE_TABLET | Freq: Every day | ORAL | Status: DC
  Administered 2022-03-11 – 2022-03-12 (×2): 40 mg via ORAL
  Filled 2022-03-11 (×2): qty 1

## 2022-03-11 MED ORDER — IRBESARTAN 150 MG PO TABS
150.0000 mg | ORAL_TABLET | Freq: Every day | ORAL | Status: DC
  Administered 2022-03-11 – 2022-03-12 (×2): 150 mg via ORAL
  Filled 2022-03-11 (×2): qty 1

## 2022-03-11 MED ORDER — DEXAMETHASONE SODIUM PHOSPHATE 10 MG/ML IJ SOLN
INTRAMUSCULAR | Status: AC
  Filled 2022-03-11: qty 1

## 2022-03-11 MED ORDER — ONDANSETRON HCL 4 MG/2ML IJ SOLN
INTRAMUSCULAR | Status: DC | PRN
  Administered 2022-03-11: 4 mg via INTRAVENOUS

## 2022-03-11 MED ORDER — ONDANSETRON HCL 4 MG PO TABS: 4.0000 mg | ORAL_TABLET | Freq: Four times a day (QID) | ORAL | Status: DC | PRN

## 2022-03-11 MED ORDER — DEXAMETHASONE SODIUM PHOSPHATE 10 MG/ML IJ SOLN
INTRAMUSCULAR | Status: DC | PRN
  Administered 2022-03-11: 10 mg via INTRAVENOUS

## 2022-03-11 MED ORDER — HYDROCODONE-ACETAMINOPHEN 5-325 MG PO TABS: 1.0000 | ORAL_TABLET | Freq: Four times a day (QID) | ORAL | 0 refills | Status: DC | PRN

## 2022-03-11 MED ORDER — FENTANYL CITRATE (PF) 100 MCG/2ML IJ SOLN
INTRAMUSCULAR | Status: DC | PRN
  Administered 2022-03-11: 25 ug via INTRAVENOUS

## 2022-03-11 MED ORDER — OXYCODONE HCL 5 MG PO TABS: 5.0000 mg | ORAL_TABLET | Freq: Once | ORAL | Status: AC | PRN

## 2022-03-11 MED ORDER — WARFARIN - PHYSICIAN DOSING INPATIENT: Freq: Every day | Status: DC

## 2022-03-11 MED ORDER — PHENYLEPHRINE HCL-NACL 20-0.9 MG/250ML-% IV SOLN
INTRAVENOUS | Status: AC
  Filled 2022-03-11: qty 250

## 2022-03-11 MED ORDER — ONDANSETRON HCL 4 MG/2ML IJ SOLN
INTRAMUSCULAR | Status: AC
  Filled 2022-03-11: qty 2

## 2022-03-11 MED ORDER — CEFAZOLIN SODIUM-DEXTROSE 2-4 GM/100ML-% IV SOLN
2.0000 g | INTRAVENOUS | Status: AC
  Administered 2022-03-11: 2 g via INTRAVENOUS
  Filled 2022-03-11: qty 100

## 2022-03-11 MED ORDER — TORSEMIDE 20 MG PO TABS
20.0000 mg | ORAL_TABLET | Freq: Every day | ORAL | Status: DC
  Administered 2022-03-12: 20 mg via ORAL
  Filled 2022-03-11: qty 1

## 2022-03-11 MED ORDER — EPHEDRINE SULFATE-NACL 50-0.9 MG/10ML-% IV SOSY
PREFILLED_SYRINGE | INTRAVENOUS | Status: DC | PRN
  Administered 2022-03-11: 10 mg via INTRAVENOUS

## 2022-03-11 MED ORDER — WARFARIN SODIUM 5 MG PO TABS: 5.0000 mg | ORAL_TABLET | ORAL | Status: DC

## 2022-03-11 MED ORDER — LIDOCAINE HCL (PF) 2 % IJ SOLN
INTRAMUSCULAR | Status: AC
  Filled 2022-03-11: qty 5

## 2022-03-11 MED ORDER — POLYETHYLENE GLYCOL 3350 17 G PO PACK: 17.0000 g | PACK | Freq: Every day | ORAL | Status: DC | PRN

## 2022-03-11 MED ORDER — OXYCODONE HCL 5 MG PO TABS
ORAL_TABLET | ORAL | Status: AC
  Administered 2022-03-11: 5 mg via ORAL
  Filled 2022-03-11: qty 1

## 2022-03-11 MED ORDER — METHOCARBAMOL 1000 MG/10ML IJ SOLN: 500.0000 mg | Freq: Four times a day (QID) | INTRAVENOUS | Status: DC | PRN

## 2022-03-11 MED ORDER — HYDROCODONE-ACETAMINOPHEN 7.5-325 MG PO TABS: 1.0000 | ORAL_TABLET | ORAL | Status: DC | PRN

## 2022-03-11 MED ORDER — SODIUM CHLORIDE 0.9 % IR SOLN
Status: DC | PRN
  Administered 2022-03-11: 1000 mL

## 2022-03-11 MED ORDER — ALUM & MAG HYDROXIDE-SIMETH 200-200-20 MG/5ML PO SUSP: 30.0000 mL | ORAL | Status: DC | PRN

## 2022-03-11 MED ORDER — PHENOL 1.4 % MT LIQD: 1.0000 | OROMUCOSAL | Status: DC | PRN

## 2022-03-11 MED ORDER — POTASSIUM CHLORIDE IN NACL 20-0.45 MEQ/L-% IV SOLN
INTRAVENOUS | Status: DC
  Filled 2022-03-11 (×2): qty 1000

## 2022-03-11 MED ORDER — ORAL CARE MOUTH RINSE: 15.0000 mL | Freq: Once | OROMUCOSAL | Status: AC

## 2022-03-11 MED ORDER — OXYCODONE HCL 5 MG/5ML PO SOLN: 5.0000 mg | Freq: Once | ORAL | Status: AC | PRN

## 2022-03-11 MED ORDER — ROCURONIUM BROMIDE 10 MG/ML (PF) SYRINGE
PREFILLED_SYRINGE | INTRAVENOUS | Status: DC | PRN
  Administered 2022-03-11: 60 mg via INTRAVENOUS
  Administered 2022-03-11: 10 mg via INTRAVENOUS

## 2022-03-11 MED ORDER — METOCLOPRAMIDE HCL 5 MG PO TABS: 5.0000 mg | ORAL_TABLET | Freq: Three times a day (TID) | ORAL | Status: DC | PRN

## 2022-03-11 MED ORDER — TRANEXAMIC ACID-NACL 1000-0.7 MG/100ML-% IV SOLN
1000.0000 mg | INTRAVENOUS | Status: AC
  Administered 2022-03-11: 1000 mg via INTRAVENOUS
  Filled 2022-03-11: qty 100

## 2022-03-11 MED ORDER — MENTHOL 3 MG MT LOZG: 1.0000 | LOZENGE | OROMUCOSAL | Status: DC | PRN

## 2022-03-11 MED ORDER — METHOCARBAMOL 500 MG PO TABS: 500.0000 mg | ORAL_TABLET | Freq: Four times a day (QID) | ORAL | Status: DC | PRN

## 2022-03-11 MED ORDER — LIDOCAINE 2% (20 MG/ML) 5 ML SYRINGE
INTRAMUSCULAR | Status: DC | PRN
  Administered 2022-03-11: 20 mg via INTRAVENOUS

## 2022-03-11 MED ORDER — WATER FOR IRRIGATION, STERILE IR SOLN
Status: DC | PRN
  Administered 2022-03-11: 2000 mL

## 2022-03-11 MED ORDER — EPHEDRINE 5 MG/ML INJ
INTRAVENOUS | Status: AC
  Filled 2022-03-11: qty 5

## 2022-03-11 MED ORDER — BUPIVACAINE LIPOSOME 1.3 % IJ SUSP
INTRAMUSCULAR | Status: DC | PRN
  Administered 2022-03-11: 10 mL via PERINEURAL

## 2022-03-11 MED ORDER — DIPHENHYDRAMINE HCL 12.5 MG/5ML PO ELIX: 12.5000 mg | ORAL_SOLUTION | ORAL | Status: DC | PRN

## 2022-03-11 MED ORDER — ROCURONIUM BROMIDE 10 MG/ML (PF) SYRINGE
PREFILLED_SYRINGE | INTRAVENOUS | Status: AC
  Filled 2022-03-11: qty 10

## 2022-03-11 MED ORDER — CHLORHEXIDINE GLUCONATE 0.12 % MT SOLN
15.0000 mL | Freq: Once | OROMUCOSAL | Status: AC
  Administered 2022-03-11: 15 mL via OROMUCOSAL

## 2022-03-11 MED ORDER — ACETAMINOPHEN 500 MG PO TABS
500.0000 mg | ORAL_TABLET | Freq: Four times a day (QID) | ORAL | Status: AC
  Administered 2022-03-11 (×2): 500 mg via ORAL
  Filled 2022-03-11 (×3): qty 1

## 2022-03-11 MED ORDER — PROPOFOL 10 MG/ML IV BOLUS
INTRAVENOUS | Status: AC
  Filled 2022-03-11: qty 20

## 2022-03-11 MED ORDER — METOCLOPRAMIDE HCL 5 MG/ML IJ SOLN: 5.0000 mg | Freq: Three times a day (TID) | INTRAMUSCULAR | Status: DC | PRN

## 2022-03-11 MED ORDER — 0.9 % SODIUM CHLORIDE (POUR BTL) OPTIME
TOPICAL | Status: DC | PRN
  Administered 2022-03-11: 1000 mL

## 2022-03-11 MED ORDER — DOCUSATE SODIUM 100 MG PO CAPS
100.0000 mg | ORAL_CAPSULE | Freq: Two times a day (BID) | ORAL | Status: DC
  Administered 2022-03-12: 100 mg via ORAL
  Filled 2022-03-11 (×2): qty 1

## 2022-03-11 MED ORDER — HYDROMORPHONE HCL 1 MG/ML IJ SOLN
0.2500 mg | INTRAMUSCULAR | Status: DC | PRN
  Administered 2022-03-11: 0.25 mg via INTRAVENOUS

## 2022-03-11 SURGICAL SUPPLY — 77 items
AID PSTN UNV HD RSTRNT DISP (MISCELLANEOUS) ×1
BAG COUNTER SPONGE SURGICOUNT (BAG) IMPLANT
BAG SPEC THK2 15X12 ZIP CLS (MISCELLANEOUS) ×1
BAG ZIPLOCK 12X15 (MISCELLANEOUS) ×1 IMPLANT
BASEPLATE P2 COATD GLND 6.5X30 (Shoulder) IMPLANT
BIT DRILL 1.6MX128 (BIT) IMPLANT
BIT DRILL 2.5 DIA 127 CALI (BIT) IMPLANT
BIT DRILL 4 DIA CALIBRATED (BIT) IMPLANT
BLADE SAW SGTL 73X25 THK (BLADE) ×1 IMPLANT
BOOTIES KNEE HIGH SLOAN (MISCELLANEOUS) ×2 IMPLANT
BSPLAT GLND 30 STRL LF SHLDR (Shoulder) ×1 IMPLANT
CLSR STERI-STRIP ANTIMIC 1/2X4 (GAUZE/BANDAGES/DRESSINGS) IMPLANT
COOLER ICEMAN CLASSIC (MISCELLANEOUS) IMPLANT
COVER BACK TABLE 60X90IN (DRAPES) ×1 IMPLANT
COVER SURGICAL LIGHT HANDLE (MISCELLANEOUS) ×1 IMPLANT
DRAPE INCISE IOBAN 66X45 STRL (DRAPES) ×1 IMPLANT
DRAPE ORTHO SPLIT 77X108 STRL (DRAPES) ×2
DRAPE POUCH INSTRU U-SHP 10X18 (DRAPES) ×1 IMPLANT
DRAPE SHEET LG 3/4 BI-LAMINATE (DRAPES) ×1 IMPLANT
DRAPE SURG 17X11 SM STRL (DRAPES) ×1 IMPLANT
DRAPE SURG ORHT 6 SPLT 77X108 (DRAPES) ×2 IMPLANT
DRAPE TOP 10253 STERILE (DRAPES) ×1 IMPLANT
DRAPE U-SHAPE 47X51 STRL (DRAPES) ×1 IMPLANT
DRSG AQUACEL AG ADV 3.5X 6 (GAUZE/BANDAGES/DRESSINGS) ×1 IMPLANT
DURAPREP 26ML APPLICATOR (WOUND CARE) ×2 IMPLANT
ELECT BLADE TIP CTD 4 INCH (ELECTRODE) ×1 IMPLANT
ELECT REM PT RETURN 15FT ADLT (MISCELLANEOUS) ×1 IMPLANT
FACESHIELD WRAPAROUND (MASK) ×1 IMPLANT
FACESHIELD WRAPAROUND OR TEAM (MASK) ×1 IMPLANT
GLOVE BIO SURGEON STRL SZ7.5 (GLOVE) ×1 IMPLANT
GLOVE BIOGEL PI IND STRL 6.5 (GLOVE) ×1 IMPLANT
GLOVE BIOGEL PI IND STRL 8 (GLOVE) ×1 IMPLANT
GLOVE SURG SS PI 6.5 STRL IVOR (GLOVE) ×1 IMPLANT
GOWN STRL REUS W/ TWL LRG LVL3 (GOWN DISPOSABLE) ×1 IMPLANT
GOWN STRL REUS W/ TWL XL LVL3 (GOWN DISPOSABLE) ×1 IMPLANT
GOWN STRL REUS W/TWL LRG LVL3 (GOWN DISPOSABLE) ×1
GOWN STRL REUS W/TWL XL LVL3 (GOWN DISPOSABLE) ×1
HANDPIECE INTERPULSE COAX TIP (DISPOSABLE) ×1
HOOD PEEL AWAY FLYTE STAYCOOL (MISCELLANEOUS) ×3 IMPLANT
HUMERA STEM SM SHELL SHOU 10 (Miscellaneous) ×1 IMPLANT
INSERT SMALL SOCKET 32MM NEU (Insert) IMPLANT
KIT BASIN OR (CUSTOM PROCEDURE TRAY) ×1 IMPLANT
KIT TURNOVER KIT A (KITS) IMPLANT
MANIFOLD NEPTUNE II (INSTRUMENTS) ×1 IMPLANT
NDL TROCAR POINT SZ 2 1/2 (NEEDLE) IMPLANT
NEEDLE TROCAR POINT SZ 2 1/2 (NEEDLE) IMPLANT
NS IRRIG 1000ML POUR BTL (IV SOLUTION) ×1 IMPLANT
P2 COATDE GLNOID BSEPLT 6.5X30 (Shoulder) ×1 IMPLANT
PACK SHOULDER (CUSTOM PROCEDURE TRAY) ×1 IMPLANT
PAD COLD SHLDR WRAP-ON (PAD) IMPLANT
PROTECTOR NERVE ULNAR (MISCELLANEOUS) IMPLANT
RESTRAINT HEAD UNIVERSAL NS (MISCELLANEOUS) ×1 IMPLANT
RETRIEVER SUT HEWSON (MISCELLANEOUS) IMPLANT
SCREW BONE LOCKING RSP 5.0X14 (Screw) ×2 IMPLANT
SCREW BONE LOCKING RSP 5.0X30 (Screw) ×2 IMPLANT
SCREW BONE RSP LOCK 5X14 (Screw) IMPLANT
SCREW BONE RSP LOCK 5X30 (Screw) IMPLANT
SCREW RETAIN W/HEAD 4MM OFFSET (Shoulder) IMPLANT
SET HNDPC FAN SPRY TIP SCT (DISPOSABLE) ×1 IMPLANT
SLING ARM IMMOBILIZER LRG (SOFTGOODS) IMPLANT
SLING ARM IMMOBILIZER MED (SOFTGOODS) IMPLANT
STEM HUMERAL SM SHELL SHOU 10 (Miscellaneous) IMPLANT
STRIP CLOSURE SKIN 1/2X4 (GAUZE/BANDAGES/DRESSINGS) ×2 IMPLANT
SUCTION FRAZIER HANDLE 10FR (MISCELLANEOUS)
SUCTION TUBE FRAZIER 10FR DISP (MISCELLANEOUS) IMPLANT
SUPPORT WRAP ARM LG (MISCELLANEOUS) IMPLANT
SUT ETHIBOND 2 V 37 (SUTURE) IMPLANT
SUT FIBERWIRE #2 38 REV NDL BL (SUTURE)
SUT MNCRL AB 4-0 PS2 18 (SUTURE) ×1 IMPLANT
SUT VIC AB 2-0 CT1 27 (SUTURE) ×2
SUT VIC AB 2-0 CT1 TAPERPNT 27 (SUTURE) ×2 IMPLANT
SUTURE FIBERWR#2 38 REV NDL BL (SUTURE) IMPLANT
TAPE LABRALWHITE 1.5X36 (TAPE) IMPLANT
TAPE SUT LABRALTAP WHT/BLK (SUTURE) IMPLANT
TOWEL OR 17X26 10 PK STRL BLUE (TOWEL DISPOSABLE) ×1 IMPLANT
TOWEL OR NON WOVEN STRL DISP B (DISPOSABLE) ×1 IMPLANT
WATER STERILE IRR 1000ML POUR (IV SOLUTION) ×1 IMPLANT

## 2022-03-11 NOTE — Anesthesia Procedure Notes (Signed)
Anesthesia Regional Block: Interscalene brachial plexus block   Pre-Anesthetic Checklist: , timeout performed,  Correct Patient, Correct Site, Correct Laterality,  Correct Procedure, Correct Position, site marked,  Risks and benefits discussed,  Surgical consent,  Pre-op evaluation,  At surgeon's request and post-op pain management  Laterality: Left and Upper  Prep: chloraprep       Needles:  Injection technique: Single-shot  Needle Type: Echogenic Needle     Needle Length: 9cm  Needle Gauge: 21     Additional Needles:   Procedures:,,,, ultrasound used (permanent image in chart),,    Narrative:  Start time: 03/11/2022 7:01 AM End time: 03/11/2022 7:07 AM Injection made incrementally with aspirations every 5 mL.  Performed by: Personally  Anesthesiologist: Annye Asa, MD  Additional Notes: Pt identified in Holding room.  Monitors applied. Working IV access confirmed. Sterile prep L clavicle and neck.  #21ga ECHOgenic Arrow block needle to interscalene brachial plexus with US guidance.  10cc 0.5% Bupivacaine 1:200k epi with Exparel injected incrementally after negative test dose.  Patient asymptomatic, VSS, no heme aspirated, tolerated well.   Jenita Seashore, MD

## 2022-03-11 NOTE — Anesthesia Procedure Notes (Signed)
Procedure Name: Intubation Date/Time: 03/11/2022 7:32 AM  Performed by: Lind Covert, CRNAPre-anesthesia Checklist: Emergency Drugs available, Patient identified, Suction available, Patient being monitored and Timeout performed Patient Re-evaluated:Patient Re-evaluated prior to induction Oxygen Delivery Method: Circle system utilized Preoxygenation: Pre-oxygenation with 100% oxygen Induction Type: IV induction Ventilation: Mask ventilation without difficulty Laryngoscope Size: Mac and 3 Grade View: Grade I Tube type: Oral Tube size: 7.0 mm Number of attempts: 1 Airway Equipment and Method: Stylet Placement Confirmation: ETT inserted through vocal cords under direct vision, positive ETCO2 and breath sounds checked- equal and bilateral Secured at: 22 cm Tube secured with: Tape Dental Injury: Teeth and Oropharynx as per pre-operative assessment

## 2022-03-11 NOTE — Plan of Care (Signed)
  Problem: Education: Goal: Knowledge of the prescribed therapeutic regimen will improve Outcome: Progressing   Problem: Activity: Goal: Ability to tolerate increased activity will improve Outcome: Progressing   Problem: Pain Management: Goal: Pain level will decrease with appropriate interventions Outcome: Progressing   Problem: Education: Goal: Knowledge of General Education information will improve Description: Including pain rating scale, medication(s)/side effects and non-pharmacologic comfort measures Outcome: Progressing   Problem: Activity: Goal: Risk for activity intolerance will decrease Outcome: Progressing   Problem: Nutrition: Goal: Adequate nutrition will be maintained Outcome: Progressing   Problem: Elimination: Goal: Will not experience complications related to bowel motility Outcome: Progressing   Problem: Pain Managment: Goal: General experience of comfort will improve Outcome: Progressing

## 2022-03-11 NOTE — H&P (Addendum)
Sierra [name] is an 76 y.o. female.   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  Pain interferes with sleep and quality of life.  BMI: Estimated body mass index is 28.47 kg/m as calculated from the following:   Height as of this encounter: '5\' 5"'$  (1.651 m).   Weight as of this encounter: 77.6 kg.  Lab Results  Component Value Date   ALBUMIN 3.5 06/08/2015   Diabetes: Patient does not have a diagnosis of diabetes.     Smoking Status:   reports that she has never smoked. She has never used smokeless tobacco.     Past Medical History:  Diagnosis Date   Anxiety    Arthritis    "joints" (09/19/2013)   Atrial fibrillation (HCC)    Basal cell carcinoma of cheek    left   CHF (congestive heart failure) (HCC)    Chronic lower back pain    Dysrhythmia    GERD (gastroesophageal reflux disease)    H/O hiatal hernia    Hypertension    Migraine    "haven't had any in a long time" (09/19/2013)   Pneumonia ~ 2012    Past Surgical History:  Procedure Laterality Date   BASAL CELL CARCINOMA EXCISION Left    "cheek"   CARDIOVERSION N/A 07/15/2017   Procedure: CARDIOVERSION;  Surgeon: Thayer Headings, MD;  Location: [place];  Service: Cardiovascular;  Laterality: N/A;   CHOLECYSTECTOMY  02/19/1989   COLONOSCOPY WITH PROPOFOL N/A 07/05/2016   Procedure: COLONOSCOPY WITH PROPOFOL;  Surgeon: Garlan Fair, MD;  [facility];  Service: Endoscopy;  Laterality: N/A;   DILATION AND CURETTAGE OF UTERUS     HERNIA REPAIR  48/54/6270   umbilical   JOINT REPLACEMENT     MOHS SURGERY     OOPHORECTOMY Bilateral    REVISION TOTAL HIP ARTHROPLASTY Left 09/19/2013   SHOULDER ARTHROSCOPY W/ ROTATOR CUFF REPAIR Right    TEMPOROMANDIBULAR JOINT SURGERY     TMJ ARTHROPLASTY     TOTAL HIP ARTHROPLASTY Left 06/22/2007   TOTAL HIP ARTHROPLASTY Right  06/21/2009   TOTAL HIP REVISION Left 09/19/2013   Procedure: LEFT TOTAL HIP REVISION;  Surgeon: Kerin Salen, MD;  Location: [place];  Service: Orthopedics;  Laterality: Left;   TUBAL LIGATION     VAGINAL HYSTERECTOMY  02/19/1989    Family History  Problem Relation Age of Onset   Heart attack Mother    Heart attack Maternal Aunt    Heart attack Paternal Uncle    Social History:  reports that she has never smoked. She has never used smokeless tobacco. She reports current alcohol use. She reports that she does not use drugs.  Allergies:  Allergies  Allergen Reactions   Ace Inhibitors Cough   Codeine Nausea Only   Erythromycin Other (See Comments)    Stomach pain    Iohexol Hives     Code: HIVES, Desc: patient states develops hives w/ iv contrast/mms    Iodinated Contrast Media Other (See Comments)    Hives    Medications Prior to Admission  Medication Sig Dispense Refill   acetaminophen (TYLENOL) 650 MG CR tablet Take 1,300 mg by mouth in the morning and at bedtime.     Cholecalciferol (VITAMIN D-3) 5000 UNITS TABS Take 5,000 Units by mouth every Monday, Wednesday, and Friday.      citalopram (CELEXA) 20 MG tablet Take 20  mg by mouth daily.     Coenzyme Q10 (CO Q-10) 100 MG CAPS Take 100 mg by mouth daily.     colestipol (COLESTID) 5 g packet Take 5 g by mouth 2 (two) times daily.   3   fexofenadine (ALLEGRA) 180 MG tablet Take 180 mg by mouth at bedtime.     Magnesium 250 MG TABS Take 250 mg by mouth every Monday, Wednesday, and Friday.     Menthol, Topical Analgesic, (BIOFREEZE) 4 % GEL Apply 1 application. topically daily as needed (pain).     metoprolol succinate (TOPROL-XL) 50 MG 24 hr tablet Take 2 tablets (100 mg total) by mouth 2 (two) times daily. 360 tablet 3   Multiple Vitamins-Minerals (MEMORY VITE PO) Take 3 tablets by mouth daily. Memory plus     Multiple Vitamins-Minerals (MULTIVITAMIN WITH MINERALS) tablet Take 1 tablet by mouth daily. 50+ for her     Multiple  Vitamins-Minerals (PRESERVISION AREDS 2) CAPS Take 1 capsule by mouth in the morning and at bedtime.     potassium chloride (KLOR-CON) 10 MEQ tablet Take 10 mEq by mouth daily.     RABEprazole (ACIPHEX) 20 MG tablet Take 20 mg by mouth daily.      torsemide (DEMADEX) 20 MG tablet Take 20 mg by mouth daily.     valsartan (DIOVAN) 160 MG tablet Take 160 mg by mouth daily.     warfarin (COUMADIN) 5 MG tablet Take 0.5 tablets (2.5 mg total) by mouth at bedtime. (Patient taking differently: Take 2.5-5 mg by mouth See admin instructions. Take 5 mg on Mon and Fri Take 2.5 mg on Sun, Tues, Wed, Thurs, and Sat)      Results for orders placed or performed during the hospital encounter of 03/11/22 (from the past 48 hour(s))  PT-INR Day of Surgery per protocol     Status: None   Collection Time: 03/11/22  5:39 AM  Result Value Ref Range   Prothrombin Time 14.6 11.4 - 15.2 seconds   INR 1.2 0.8 - 1.2    Comment: (NOTE) INR goal varies based on device and disease states. Performed at [facility], [place] [address], [place], [place] [address]    No results found.  Review of Systems  All other systems reviewed and are negative.   Blood pressure (!) 154/70, pulse 83, temperature 97.7 F (36.5 C), temperature source Oral, resp. rate 15, height '5\' 5"'$  (1.651 m), weight 77.6 kg, SpO2 97 %. Physical Exam HENT:     Head: Atraumatic.  Eyes:     Extraocular Movements: Extraocular movements intact.  Cardiovascular:     Pulses: Normal pulses.  Pulmonary:     Effort: Pulmonary effort is normal.  Musculoskeletal:     Comments: L shoulder pain with limited ROM. NVID  Neurological:     Mental Status: She is alert.  Psychiatric:        Mood and Affect: Mood normal.      Assessment/Plan L shoulder rotator cuff tear arthropathy Plan L reverse TSA Risks / benefits of surgery discussed Consent on chart  NPO for OR Preop antibiotics   Rhae Hammock, MD 03/11/2022, 7:11  AM

## 2022-03-11 NOTE — Discharge Instructions (Signed)
Discharge Instructions after Reverse Total Shoulder Arthroplasty   . A sling has been provided for you. You are to wear this at all times (except for bathing and dressing), until your first post operative visit with Dr. Nezzie Manera. Please also wear while sleeping at night. While you bath and dress, let the arm/elbow extend straight down to stretch your elbow. Wiggle your fingers and pump your first while your in the sling to prevent hand swelling. . Use ice on the shoulder intermittently over the first 48 hours after surgery. Continue to use ice or and ice machine as needed after 48 hours for pain control/swelling.  . Pain medicine has been prescribed for you.  . Use your medicine liberally over the first 48 hours, and then you can begin to taper your use. You may take Extra Strength Tylenol or Tylenol only in place of the pain pills. DO NOT take ANY nonsteroidal anti-inflammatory pain medications: Advil, Motrin, Ibuprofen, Aleve, Naproxen or Naprosyn.  . Take one aspirin a day for 2 weeks after surgery, unless you have an aspirin sensitivity/allergy or asthma.  . Leave your dressing on until your first follow up visit.  You may shower with the dressing.  Hold your arm as if you still have your sling on while you shower. . Simply allow the water to wash over the site and then pat dry. Make sure your axilla (armpit) is completely dry after showering.    Please call [phone] during normal business hours or [phone] after hours for any problems. Including the following:  - excessive redness of the incisions - drainage for more than 4 days - fever of more than 101.5 F  *Please note that pain medications will not be refilled after hours or on weekends.     

## 2022-03-11 NOTE — Transfer of Care (Signed)
Immediate Anesthesia Transfer of Care Note  Patient: Sierra [name]  Procedure(s) Performed: REVERSE SHOULDER ARTHROPLASTY (Left: Shoulder)  Patient Location: PACU  Anesthesia Type:General  Level of Consciousness: sedated  Airway & Oxygen Therapy: Patient Spontanous Breathing and Patient connected to face mask oxygen  Post-op Assessment: Report given to RN and Post -op Vital signs reviewed and stable  Post vital signs: Reviewed and stable  Last Vitals:  Vitals Value Taken Time  BP    Temp    Pulse 85 03/11/22 0924  Resp 14 03/11/22 0924  SpO2 98 % 03/11/22 0924  Vitals shown include unvalidated device data.  Last Pain:  Vitals:   03/11/22 0556  TempSrc: Oral  PainSc:          Complications: No notable events documented.

## 2022-03-11 NOTE — Anesthesia Postprocedure Evaluation (Signed)
Anesthesia Post Note  Patient: Sierra [name]  Procedure(s) Performed: REVERSE SHOULDER ARTHROPLASTY (Left: Shoulder)     Patient location during evaluation: PACU Anesthesia Type: General Level of consciousness: awake and alert, patient cooperative and oriented Pain management: pain level controlled Vital Signs Assessment: post-procedure vital signs reviewed and stable Respiratory status: spontaneous breathing, nonlabored ventilation and respiratory function stable Cardiovascular status: blood pressure returned to baseline and stable Postop Assessment: no apparent nausea or vomiting and adequate PO intake Anesthetic complications: no   No notable events documented.  Last Vitals:  Vitals:   03/11/22 1000 03/11/22 1110  BP: (!) 164/76 (!) 155/84  Pulse: 84 76  Resp: 11 18  Temp:  36.5 C  SpO2: 100% 100%    Last Pain:  Vitals:   03/11/22 1110  TempSrc: Oral  PainSc: 3                  [name],E. Boy Delamater

## 2022-03-11 NOTE — Op Note (Signed)
Procedure(s): REVERSE SHOULDER ARTHROPLASTY Procedure Note  ELLENA KAMEN female 76 y.o. 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 female  With advanced left shoulder arthritis with irrepairable rotator cuff tear. Pain and dysfunction interfered with quality of life and nonoperative treatment with activity modification, NSAIDS and injections failed.     Surgeon: Rhae Hammock   Assistants: Joanell Rising PA-C Randall Hiss was present and scrubbed throughout the procedure and was essential in positioning, retraction, exposure, and closure)  Anesthesia: General endotracheal anesthesia with preoperative interscalene block given by the attending anesthesiologist    Procedure Detail  REVERSE SHOULDER ARTHROPLASTY   Estimated Blood Loss:  200 mL         Drains: none  Blood Given: none          Specimens: none        Complications:  * No complications entered in OR log *         Disposition: PACU - hemodynamically stable.         Condition: stable      OPERATIVE FINDINGS:  A DJO Altivate pressfit reverse total shoulder arthroplasty was placed with a  size 10 stem, a 32-4 glenosphere, and a standard-mm poly insert. The base plate  fixation was excellent.  PROCEDURE: The patient was identified in the preoperative holding area  where I personally marked the operative site after verifying site, side,  and procedure with the patient. An interscalene block given by  the attending anesthesiologist in the holding area and the patient was taken back to the operating room where all extremities were  carefully padded in position after general anesthesia was induced. She  was placed in a beach-chair position and the operative upper extremity was  prepped and draped in a standard sterile  fashion. An approximately 10-  cm incision was made from the tip of the coracoid process to the center  point of the humerus at the level of the axilla. Dissection was carried  down through subcutaneous tissues to the level of the cephalic vein  which was taken laterally with the deltoid. The pectoralis major was  retracted medially. The subdeltoid space was developed and the lateral  edge of the conjoined tendon was identified. The undersurface of  conjoined tendon was palpated and the musculocutaneous nerve was not in  the field. Retractor was placed underneath the conjoined and second  retractor was placed lateral into the deltoid. The circumflex humeral  artery and vessels were identified and clamped and coagulated. The  biceps tendon was tenotomized.  The subscapularis was taken down as a peel with the underlying capsule.  The  joint was then gently externally rotated while the capsule was released  from the humeral neck around to just beyond the 6 o'clock position. At  this point, the joint was dislocated and the humeral head was presented  into the wound. The excessive osteophyte formation was removed with a  large rongeur.  The cutting guide was used to make the appropriate  head cut and the head was saved for potentially bone grafting.  The glenoid was exposed with the arm in an  abducted extended position. The anterior and posterior labrum were  completely excised and the capsule was released circumferentially to  allow for exposure of the glenoid for preparation. The 2.5 mm drill was  placed using the guide in 5-10 inferior angulation and the tap was then advanced in the same hole. Small and large reamers were then used. The tap was then removed and the Metaglene was then screwed in with excellent purchase.  The peripheral guide was then used to drilled measured and filled peripheral locking screws. The size 32-4 glenosphere was then impacted on the [facility] taper and the central screw  was placed. The humerus was then again exposed and the diaphyseal reamers were used followed by the metaphyseal reamers. The final broach was left in place in the proximal trial was placed. The joint was reduced and with this implant it was felt that soft tissue tensioning was appropriate with excellent stability and excellent range of motion. Therefore, final humeral stem was placed press-fit.  And then the trial polyethylene inserts were tested again and the above implant was felt to be the most appropriate for final insertion. The joint was reduced taken through full range of motion and felt to be stable. Soft tissue tension was appropriate.  The joint was then copiously irrigated with pulse  lavage and the wound was then closed. The subscapularis was not repaired.  Skin was closed with 2-0 Vicryl in a deep dermal layer and 4-0  Monocryl for skin closure. Steri-Strips were applied. Sterile  dressings were then applied as well as a sling. The patient was allowed  to awaken from general anesthesia, transferred to stretcher, and taken  to recovery room in stable condition.   POSTOPERATIVE PLAN: The patient will be kept in the hospital postoperatively  for pain control and therapy.

## 2022-03-12 DIAGNOSIS — M19012 Primary osteoarthritis, left shoulder: Secondary | ICD-10-CM | POA: Diagnosis not present

## 2022-03-12 LAB — PROTIME-INR
INR: 1.1 (ref 0.8–1.2)
Prothrombin Time: 14.2 seconds (ref 11.4–15.2)

## 2022-03-12 NOTE — Care Plan (Signed)
Ortho Bundle Case Management Note  Patient Details  Name: Sierra [name] MRN: [id number] Date of Birth: Nov 08, 1945     Spoke with patient  prior to surgery. She will discharge to home with family to assist. No DME or Therapy needs at this time.  Patient and MD in agreement with this.                 DME Arranged:    DME Agency:     HH Arranged:    HH Agency:     Additional Comments: Please contact me with any questions of if this plan should need to change.  Ladell Heads,  [place] Specialist  [phone] 03/12/2022, 10:15 AM

## 2022-03-12 NOTE — Progress Notes (Signed)
Pt alert and oriented. Surgical dressing clean, dry and intact. No questions regarding discharge instructions. Belongings sent home with pt. 

## 2022-03-12 NOTE — Progress Notes (Signed)
PATIENT ID: Sierra [name]  MRN: [id number]  DOB/AGE:  07-20-1945 / 76 y.o.  1 Day Post-Op Procedure(s) (LRB): REVERSE SHOULDER ARTHROPLASTY (Left)    PROGRESS NOTE Subjective:   Patient is alert, oriented, no Nausea, no Vomiting, yes passing gas, no Bowel Movement. Taking PO well. Denies SOB, Chest or Calf Pain. Using Incentive Spirometer, PAS in place. Patient reports pain as  minimal ,     Objective: Vital signs in last 24 hours: Temp:  [96.8 F (36 C)-98.6 F (37 C)] 98.2 F (36.8 C) (09/22 0618) Pulse Rate:  [76-90] 83 (09/22 0618) Resp:  [11-18] 18 (09/22 0618) BP: (138-164)/(60-84) 148/64 (09/22 0618) SpO2:  [95 %-100 %] 100 % (09/22 0618)    Intake/Output from previous day: I/O last 3 completed shifts: In: 3971.8 [P.O.:1200; I.V.:2771.8] Out: 500 [Urine:400; Blood:100]   Intake/Output this shift: No intake/output data recorded.   LABORATORY DATA: Recent Labs    03/11/22 0539 03/12/22 0359  INR 1.2 1.1    Examination: Neurologically intact Neurovascular intact Sensation intact distally Intact pulses distally Incision: dressing C/D/I and no drainage}  Assessment:   1 Day Post-Op Procedure(s) (LRB): REVERSE SHOULDER ARTHROPLASTY (Left) ADDITIONAL DIAGNOSIS:  Cardiac Arrythmia PAC and PAF and Chronic diastolic heart failure  Plan:  Plan to dc home later today.  Pt will have rx's for norco and tizanidine for pain.  Gentle ROM per home exercise program.  Follow up in office with Dr. Tamera Punt as directed.  Call with any issues     Joanell Rising 03/12/2022, 8:00 AM

## 2022-03-12 NOTE — Evaluation (Signed)
Occupational Therapy Evaluation Patient Details Name: Sierra [name] MRN: [id number] DOB: 1946/06/04 Today's Date: 03/12/2022   History of Present Illness Sierra [name] is a 76 yr old female who is s/p a reverse shoulder arthroplasty on 03-11-2022, secondary to advanced L shoulder arthritis with a rotator cuff tear. PMH: arthritis, a fib, CHF, HTN, PNA, hiatal hernia   Clinical Impression   Pt is s/p reverse shoulder arthroplasty without functional use of non-dominant left upper extremity, and now with post-surgical shoulder precautions. Therapist provided education and instruction to patient and her spouse with regards to L UE ROM/exercises, NWB status, compensatory strategies for ADL management, sling wear schedule, proper L UE positioning, how to correctly donn and doff L UE sling and clothing articles, performing bathing tasks while maintaining shoulder precautions, and the use of ice for edema management. Patient and spouse verbalized and demonstrated understanding as needed. Patient to follow up with MD for further therapy needs.        Recommendations for follow up therapy are one component of a multi-disciplinary discharge planning process, led by the attending physician.  Recommendations may be updated based on patient status, additional functional criteria and insurance authorization.   Follow Up Recommendations  Follow physician's recommendations for discharge plan and follow up therapies    Assistance Recommended at Discharge Intermittent Supervision/Assistance  Patient can return home with the following Assistance with cooking/housework;Help with stairs or ramp for entrance;Assist for transportation;A little help with bathing/dressing/bathroom    Functional Status Assessment  Patient has had a recent decline in their functional status and demonstrates the ability to make significant improvements in function in a reasonable and predictable amount of time.  Equipment Recommendations   None recommended by OT           Precautions / Restrictions Precautions Precautions: Shoulder Type of Shoulder Precautions: L UE sling at all times other than exercise and ADLs, L UE NWB, okay to perform AROM of L elbow, wrist, and hand to tolerance, no PROM or AROM of L shoulder Required Braces or Orthoses: Sling (L UE) Restrictions Weight Bearing Restrictions: Yes LUE Weight Bearing: Non weight bearing      Mobility Bed Mobility Overal bed mobility: Needs Assistance Bed Mobility: Supine to Sit     Supine to sit: Supervision            Balance     Sitting balance-Leahy Scale: Good               ADL either performed or assessed with clinical judgement      Vision Patient Visual Report: No change from baseline Additional Comments: She wears corrective lenses            Pertinent Vitals/Pain Pain Assessment Pain Assessment: No/denies pain     Hand Dominance Right   Extremity/Trunk Assessment     Lower Extremity Assessment Lower Extremity Assessment: Overall WFL for tasks assessed       Communication Communication Communication: No difficulties   Cognition Arousal/Alertness: Awake/alert Behavior During Therapy: WFL for tasks assessed/performed Overall Cognitive Status: Within Functional Limits for tasks assessed           General Comments: Oriented x4, Cooperative, Able to Follow Commands Without Difficulty           Shoulder Instructions Shoulder Instructions Donning/doffing shirt without moving shoulder: Caregiver independent with task;Patient able to independently direct caregiver Donning/doffing sling/immobilizer: Caregiver independent with task;Patient able to independently direct caregiver Correct positioning of sling/immobilizer: Caregiver independent with task;Patient able to independently  direct caregiver ROM for elbow, wrist and digits of operated UE: Supervision/safety Sling wearing schedule (on at all times/off for ADL's):  Caregiver independent with task;Patient able to independently direct caregiver Proper positioning of operated UE when showering: Caregiver independent with task;Patient able to independently direct caregiver Positioning of UE while sleeping: Caregiver independent with task;Patient able to independently direct caregiver    Home Living Family/patient expects to be discharged to:: Private residence Living Arrangements: Spouse/significant other   Type of Home: House Home Access: Stairs to enter Technical brewer of Steps: 4 Entrance Stairs-Rails: Left;Right Home Layout: One level     Bathroom Shower/Tub: Occupational psychologist: Handicapped height     Home Equipment: [place] - single point;Toilet riser;Shower seat;Grab bars - Statistician (2 wheels);BSC/3in1          Prior Functioning/Environment Prior Level of Function : Independent/Modified Independent             Mobility Comments: She occasionally used a cane vs. RW for ambulation inside and outside the home. ADLs Comments: She was independent with ADLs & driving.        OT Problem List: Decreased strength;Decreased range of motion;Decreased knowledge of precautions;Impaired UE functional use         OT Goals(Current goals can be found in the care plan section) Acute Rehab OT Goals Patient Stated Goal: To return to normal activities. OT Goal Formulation: With patient/family         AM-PAC OT "6 Clicks" Daily Activity     Outcome Measure Help from another person eating meals?: None Help from another person taking care of personal grooming?: None Help from another person toileting, which includes using toliet, bedpan, or urinal?: A Little Help from another person bathing (including washing, rinsing, drying)?: A Little Help from another person to put on and taking off regular upper body clothing?: A Little Help from another person to put on and taking off regular lower body clothing?: A  Little 6 Click Score: 20   End of Session Nurse Communication:  (Nurse cleared the pt for participation in the session)  Activity Tolerance: Patient tolerated treatment well Patient left: in bed;with call bell/phone within reach;with family/visitor present  OT Visit Diagnosis: History of falling (Z91.81);Muscle weakness (generalized) (M62.81)                Time: 9201-0071 OT Time Calculation (min): 40 min Charges:  OT General Charges $OT Visit: 1 Visit OT Evaluation $OT Eval Moderate Complexity: 1 Mod OT Treatments $Self Care/Home Management : 8-22 mins $Therapeutic Activity: 8-22 mins    Leota Sauers, OTR/L 03/12/2022, 12:05 PM

## 2022-03-12 NOTE — Progress Notes (Signed)
  Transition of Care [facility]) Screening Note   Patient Details  Name: Sierra [name] Date of Birth: Mar 03, 1946   Transition of Care [facility]) CM/SW Contact:    Lennart Pall, LCSW Phone Number: 03/12/2022, 10:49 AM    Transition of Care Department [facility]) has reviewed patient and no TOC needs have been identified at this time. We will continue to monitor patient advancement through interdisciplinary progression rounds. If new patient transition needs arise, please place a TOC consult.

## 2022-03-12 NOTE — Plan of Care (Signed)
  Problem: Education: Goal: Knowledge of the prescribed therapeutic regimen will improve Outcome: Progressing   Problem: Activity: Goal: Ability to tolerate increased activity will improve Outcome: Progressing

## 2022-03-13 ENCOUNTER — Encounter (HOSPITAL_COMMUNITY): Payer: Self-pay | Admitting: Orthopedic Surgery

## 2022-03-19 ENCOUNTER — Ambulatory Visit: Payer: Medicare Other | Attending: Interventional Cardiology | Admitting: *Deleted

## 2022-03-19 DIAGNOSIS — I480 Paroxysmal atrial fibrillation: Secondary | ICD-10-CM

## 2022-03-19 DIAGNOSIS — Z5181 Encounter for therapeutic drug level monitoring: Secondary | ICD-10-CM

## 2022-03-19 LAB — POCT INR: INR: 1.2 — AB (ref 2.0–3.0)

## 2022-03-19 NOTE — Patient Instructions (Addendum)
Description   Today and tomorrow take 1.5 tablets (7.'5mg'$ ) then continue taking Warfarin 1/2 tablet daily except 1 tablet on Mondays and Fridays. Recheck INR in 1 week. Call with any new medications, any new procedures, and with any concerns  [phone] or [phone]

## 2022-03-25 ENCOUNTER — Ambulatory Visit: Payer: Medicare Other | Attending: Interventional Cardiology | Admitting: *Deleted

## 2022-03-25 DIAGNOSIS — I480 Paroxysmal atrial fibrillation: Secondary | ICD-10-CM

## 2022-03-25 DIAGNOSIS — Z5181 Encounter for therapeutic drug level monitoring: Secondary | ICD-10-CM | POA: Diagnosis not present

## 2022-03-25 LAB — POCT INR: INR: 2.1 (ref 2.0–3.0)

## 2022-03-25 NOTE — Patient Instructions (Addendum)
Description   Today take 1 tablet ('5mg'$ ) then continue taking Warfarin 1/2 tablet daily except 1 tablet on Mondays and Fridays. Recheck INR in 2 weeks. Call with any new medications, any new procedures, and with any concerns  [phone] or [phone]

## 2022-03-26 DIAGNOSIS — M19012 Primary osteoarthritis, left shoulder: Secondary | ICD-10-CM | POA: Diagnosis not present

## 2022-03-26 DIAGNOSIS — Z471 Aftercare following joint replacement surgery: Secondary | ICD-10-CM | POA: Diagnosis not present

## 2022-03-26 DIAGNOSIS — Z96612 Presence of left artificial shoulder joint: Secondary | ICD-10-CM | POA: Diagnosis not present

## 2022-03-27 ENCOUNTER — Other Ambulatory Visit: Payer: Self-pay | Admitting: Interventional Cardiology

## 2022-03-31 DIAGNOSIS — Z23 Encounter for immunization: Secondary | ICD-10-CM | POA: Diagnosis not present

## 2022-04-08 ENCOUNTER — Ambulatory Visit: Payer: Medicare Other | Attending: Cardiology

## 2022-04-08 DIAGNOSIS — Z5181 Encounter for therapeutic drug level monitoring: Secondary | ICD-10-CM

## 2022-04-08 DIAGNOSIS — I480 Paroxysmal atrial fibrillation: Secondary | ICD-10-CM

## 2022-04-08 LAB — POCT INR: INR: 2.1 (ref 2.0–3.0)

## 2022-04-08 NOTE — Patient Instructions (Signed)
INCREASE TO  1/2 tablet daily except 1 tablet on Mondays, Wednesdays and Fridays. Recheck INR in 2 weeks. Call with any new medications, any new procedures, and with any concerns  [phone] or [phone]

## 2022-04-20 DIAGNOSIS — C44212 Basal cell carcinoma of skin of right ear and external auricular canal: Secondary | ICD-10-CM | POA: Diagnosis not present

## 2022-04-22 ENCOUNTER — Ambulatory Visit: Payer: Medicare Other | Attending: Cardiology | Admitting: *Deleted

## 2022-04-22 DIAGNOSIS — I480 Paroxysmal atrial fibrillation: Secondary | ICD-10-CM | POA: Diagnosis not present

## 2022-04-22 DIAGNOSIS — Z5181 Encounter for therapeutic drug level monitoring: Secondary | ICD-10-CM | POA: Diagnosis not present

## 2022-04-22 LAB — POCT INR: INR: 3.8 — AB (ref 2.0–3.0)

## 2022-04-22 NOTE — Patient Instructions (Signed)
Description   Do not take any warfarin tonight then continue taking 1/2 tablet daily except 1 tablet on Mondays, Wednesdays and Fridays. Recheck INR in 2 weeks. Call with any new medications, any new procedures, and with any concerns  [phone] or [phone]

## 2022-05-05 DIAGNOSIS — Z96612 Presence of left artificial shoulder joint: Secondary | ICD-10-CM | POA: Diagnosis not present

## 2022-05-05 DIAGNOSIS — M25612 Stiffness of left shoulder, not elsewhere classified: Secondary | ICD-10-CM | POA: Diagnosis not present

## 2022-05-05 DIAGNOSIS — M6281 Muscle weakness (generalized): Secondary | ICD-10-CM | POA: Diagnosis not present

## 2022-05-07 ENCOUNTER — Ambulatory Visit: Payer: Medicare Other | Attending: Cardiology

## 2022-05-07 DIAGNOSIS — I480 Paroxysmal atrial fibrillation: Secondary | ICD-10-CM

## 2022-05-07 DIAGNOSIS — Z5181 Encounter for therapeutic drug level monitoring: Secondary | ICD-10-CM

## 2022-05-07 LAB — POCT INR: INR: 1.9 — AB (ref 2.0–3.0)

## 2022-05-07 NOTE — Patient Instructions (Signed)
Description   Take 1.5 tablets today and then continue taking 1/2 tablet daily except 1 tablet on Mondays, Wednesdays and Fridays. Recheck INR in 2 weeks. Call with any new medications, any new procedures, and with any concerns  [phone]

## 2022-05-18 DIAGNOSIS — M6281 Muscle weakness (generalized): Secondary | ICD-10-CM | POA: Diagnosis not present

## 2022-05-18 DIAGNOSIS — Z96612 Presence of left artificial shoulder joint: Secondary | ICD-10-CM | POA: Diagnosis not present

## 2022-05-18 DIAGNOSIS — M25612 Stiffness of left shoulder, not elsewhere classified: Secondary | ICD-10-CM | POA: Diagnosis not present

## 2022-05-20 DIAGNOSIS — Z96612 Presence of left artificial shoulder joint: Secondary | ICD-10-CM | POA: Diagnosis not present

## 2022-05-20 DIAGNOSIS — M25612 Stiffness of left shoulder, not elsewhere classified: Secondary | ICD-10-CM | POA: Diagnosis not present

## 2022-05-20 DIAGNOSIS — M6281 Muscle weakness (generalized): Secondary | ICD-10-CM | POA: Diagnosis not present

## 2022-05-21 ENCOUNTER — Ambulatory Visit: Payer: Medicare Other | Attending: Cardiovascular Disease

## 2022-05-21 DIAGNOSIS — I480 Paroxysmal atrial fibrillation: Secondary | ICD-10-CM

## 2022-05-21 DIAGNOSIS — Z5181 Encounter for therapeutic drug level monitoring: Secondary | ICD-10-CM

## 2022-05-21 LAB — POCT INR: INR: 2.4 (ref 2.0–3.0)

## 2022-05-21 NOTE — Patient Instructions (Signed)
continue taking 1/2 tablet daily except 1 tablet on Mondays, Wednesdays and Fridays. Recheck INR in 4 weeks. Call with any new medications, any new procedures, and with any concerns  [phone]

## 2022-05-25 DIAGNOSIS — Z96612 Presence of left artificial shoulder joint: Secondary | ICD-10-CM | POA: Diagnosis not present

## 2022-05-25 DIAGNOSIS — M6281 Muscle weakness (generalized): Secondary | ICD-10-CM | POA: Diagnosis not present

## 2022-05-25 DIAGNOSIS — M25612 Stiffness of left shoulder, not elsewhere classified: Secondary | ICD-10-CM | POA: Diagnosis not present

## 2022-05-27 DIAGNOSIS — M6281 Muscle weakness (generalized): Secondary | ICD-10-CM | POA: Diagnosis not present

## 2022-05-27 DIAGNOSIS — Z96612 Presence of left artificial shoulder joint: Secondary | ICD-10-CM | POA: Diagnosis not present

## 2022-05-27 DIAGNOSIS — M25612 Stiffness of left shoulder, not elsewhere classified: Secondary | ICD-10-CM | POA: Diagnosis not present

## 2022-06-01 ENCOUNTER — Ambulatory Visit: Payer: Medicare Other | Admitting: Interventional Cardiology

## 2022-06-01 DIAGNOSIS — Z96612 Presence of left artificial shoulder joint: Secondary | ICD-10-CM | POA: Diagnosis not present

## 2022-06-01 DIAGNOSIS — M6281 Muscle weakness (generalized): Secondary | ICD-10-CM | POA: Diagnosis not present

## 2022-06-01 DIAGNOSIS — M25612 Stiffness of left shoulder, not elsewhere classified: Secondary | ICD-10-CM | POA: Diagnosis not present

## 2022-06-02 DIAGNOSIS — E7800 Pure hypercholesterolemia, unspecified: Secondary | ICD-10-CM | POA: Diagnosis not present

## 2022-06-02 DIAGNOSIS — N1831 Chronic kidney disease, stage 3a: Secondary | ICD-10-CM | POA: Diagnosis not present

## 2022-06-02 DIAGNOSIS — I5032 Chronic diastolic (congestive) heart failure: Secondary | ICD-10-CM | POA: Diagnosis not present

## 2022-06-02 DIAGNOSIS — I480 Paroxysmal atrial fibrillation: Secondary | ICD-10-CM | POA: Diagnosis not present

## 2022-06-02 DIAGNOSIS — K219 Gastro-esophageal reflux disease without esophagitis: Secondary | ICD-10-CM | POA: Diagnosis not present

## 2022-06-02 DIAGNOSIS — I10 Essential (primary) hypertension: Secondary | ICD-10-CM | POA: Diagnosis not present

## 2022-06-02 DIAGNOSIS — M25612 Stiffness of left shoulder, not elsewhere classified: Secondary | ICD-10-CM | POA: Diagnosis not present

## 2022-06-03 DIAGNOSIS — L84 Corns and callosities: Secondary | ICD-10-CM | POA: Diagnosis not present

## 2022-06-03 DIAGNOSIS — I70203 Unspecified atherosclerosis of native arteries of extremities, bilateral legs: Secondary | ICD-10-CM | POA: Diagnosis not present

## 2022-06-03 DIAGNOSIS — L602 Onychogryphosis: Secondary | ICD-10-CM | POA: Diagnosis not present

## 2022-06-08 DIAGNOSIS — M6281 Muscle weakness (generalized): Secondary | ICD-10-CM | POA: Diagnosis not present

## 2022-06-08 DIAGNOSIS — Z96612 Presence of left artificial shoulder joint: Secondary | ICD-10-CM | POA: Diagnosis not present

## 2022-06-08 DIAGNOSIS — M25612 Stiffness of left shoulder, not elsewhere classified: Secondary | ICD-10-CM | POA: Diagnosis not present

## 2022-06-08 NOTE — Progress Notes (Signed)
Office Visit    Patient Name: Sierra [name] Date of Encounter: 06/09/2022  Primary Care Provider:  Emilio Aspen, MD Primary Cardiologist:  Lance Muss, MD Primary Electrophysiologist: None  Chief Complaint    Sierra [name] is a 76 y.o. female with PMH of permanent atrial fibrillation, chronic diastolic CHF, HTN, PACs, GERD, migraines who presents today for 57-month follow-up of atrial fibrillation.  Past Medical History    Past Medical History:  Diagnosis Date   Anxiety    Arthritis    "joints" (09/19/2013)   Atrial fibrillation (HCC)    Basal cell carcinoma of cheek    left   CHF (congestive heart failure) (HCC)    Chronic lower back pain    Dysrhythmia    GERD (gastroesophageal reflux disease)    H/O hiatal hernia    Hypertension    Migraine    "haven't had any in a long time" (09/19/2013)   Pneumonia ~ 2012   Past Surgical History:  Procedure Laterality Date   BASAL CELL CARCINOMA EXCISION Left    "cheek"   CARDIOVERSION N/A 07/15/2017   Procedure: CARDIOVERSION;  Surgeon: Vesta Mixer, MD;  Location: [name] ENDOSCOPY;  Service: Cardiovascular;  Laterality: N/A;   CHOLECYSTECTOMY  02/19/1989   COLONOSCOPY WITH PROPOFOL N/A 07/05/2016   Procedure: COLONOSCOPY WITH PROPOFOL;  Surgeon: Charolett Bumpers, MD;  Location: [facility] ENDOSCOPY;  Service: Endoscopy;  Laterality: N/A;   DILATION AND CURETTAGE OF UTERUS     HERNIA REPAIR  06/21/2009   umbilical   JOINT REPLACEMENT     MOHS SURGERY     OOPHORECTOMY Bilateral    REVERSE SHOULDER ARTHROPLASTY Left 03/11/2022   Procedure: REVERSE SHOULDER ARTHROPLASTY;  Surgeon: Jones Broom, MD;  Location: [facility] ORS;  Service: Orthopedics;  Laterality: Left;   REVISION TOTAL HIP ARTHROPLASTY Left 09/19/2013   SHOULDER ARTHROSCOPY W/ ROTATOR CUFF REPAIR Right    TEMPOROMANDIBULAR JOINT SURGERY     TMJ ARTHROPLASTY     TOTAL HIP ARTHROPLASTY Left 06/22/2007   TOTAL HIP ARTHROPLASTY Right 06/21/2009   TOTAL HIP  REVISION Left 09/19/2013   Procedure: LEFT TOTAL HIP REVISION;  Surgeon: Nestor Lewandowsky, MD;  Location: [facility] OR;  Service: Orthopedics;  Laterality: Left;   TUBAL LIGATION     VAGINAL HYSTERECTOMY  02/19/1989    Allergies  Allergies  Allergen Reactions   Ace Inhibitors Cough   Codeine Nausea Only   Erythromycin Other (See Comments)    Stomach pain    Iohexol Hives     Code: HIVES, Desc: patient states develops hives w/ iv contrast/mms    Iodinated Contrast Media Other (See Comments)    Hives    History of Present Illness    Sierra [name]  is a 76 year old female  with the above mention past medical history who presents today for 52-month follow-up of atrial fibrillation. [name] was initially seen in 2007 by Dr. Eldridge Dace for new onset AF with RVR.  She was patient's and had a severe GI bug with dehydration and went to the ER she was found to have atrial fibrillation with RVR.  She continued to have PACs with no atrial fibrillation symptoms.  Atrial fibrillation resolved with rehydration.  She decided to hold off on DOAC  given that her episode occurred with acute illness.  She was started on Coumadin and  2D echo was performed to evaluate for structural heart disease that showed EF of 60-65% with no RWMA and mild  focal basal hypertrophy of the septum with grade 2 DD and mild MV regurgitation.  1 event monitor that showed a with RVR patient underwent successful DCCV in 2019 but went back into atrial fibrillation.  Metoprolol XL was increased to 150 mg daily with good control.  She was seen in 08/2021 for evaluation of AF with RVR.  She was started on Toprol-XL 100 mg twice daily and sent to skilled nursing facility due to recent hip/pelvis contusion.  She was seen in follow-up on 09/2021 and noticed fast heart rates in the morning and was found to be in atrial fibrillation.  Her Toprol-XL was increased to 20 mg daily and blood pressure was noted to be well-controlled.  She was last seen 11/2021 by  Dr. Eldridge Dace and atrial fibrillation was rate controlled at that time.  She was continued on metoprolol and Coumadin for stroke prevention.  She appeared euvolemic on examination and was told to elevate legs for edema.  She had a preop clearance appointment on 03/05/2022 and was doing well from cardiac perspective.  She was granted clearance for her reverse total shoulder arthroplasty.  Ms.  Peak presents today for 42-month follow-up alone.  Since last being seen in the office patient reports that she has been doing well since her previous visit.  She denies any new cardiac complaints.  Her blood pressure today was controlled at 138/68 with heart rate of 94 bpm.  She is compliant with her current medication regimen and denies any adverse reactions.  Since her previous visit she had total shoulder arthroplasty completed on the left and is recovering well.  She reports that she may pursue knee replacement at the end of March.  She is asymptomatic with atrial fibrillation and notes no accelerated heart rates since previous visit.  We discussed the treatment plan for atrial fibrillation long-term if rate control is not achieved.   patient denies chest pain, palpitations, dyspnea, PND, orthopnea, nausea, vomiting, dizziness, syncope, edema, weight gain, or early satiety.   Home Medications    Current Outpatient Medications  Medication Sig Dispense Refill   amoxicillin (AMOXIL) 500 MG capsule as directed. Dental procedures only     Cholecalciferol (VITAMIN D-3) 5000 UNITS TABS Take 5,000 Units by mouth every Monday, Wednesday, and Friday.      citalopram (CELEXA) 20 MG tablet Take 20 mg by mouth daily.     Coenzyme Q10 (CO Q-10) 100 MG CAPS Take 100 mg by mouth daily.     colestipol (COLESTID) 5 g packet Take 5 g by mouth 2 (two) times daily.   3   fexofenadine (ALLEGRA) 180 MG tablet Take 180 mg by mouth at bedtime.     Magnesium 250 MG TABS Take 250 mg by mouth every Monday, Wednesday, and Friday.      Menthol, Topical Analgesic, (BIOFREEZE) 4 % GEL Apply 1 application. topically daily as needed (pain).     metoprolol succinate (TOPROL-XL) 50 MG 24 hr tablet Take 2 tablets (100 mg total) by mouth 2 (two) times daily. 360 tablet 3   Multiple Vitamins-Minerals (MEMORY VITE PO) Take 3 tablets by mouth daily. Memory plus     Multiple Vitamins-Minerals (MULTIVITAMIN WITH MINERALS) tablet Take 1 tablet by mouth daily. 50+ for her     Multiple Vitamins-Minerals (PRESERVISION AREDS 2) CAPS Take 1 capsule by mouth in the morning and at bedtime.     potassium chloride (KLOR-CON) 10 MEQ tablet Take 10 mEq by mouth daily.     RABEprazole (ACIPHEX) 20  MG tablet Take 20 mg by mouth daily.      torsemide (DEMADEX) 20 MG tablet Take 20 mg by mouth daily.     valsartan (DIOVAN) 160 MG tablet Take 160 mg by mouth daily.     warfarin (COUMADIN) 5 MG tablet TAKE AS DIRECTED BY COUMADIN CLINIC 80 tablet 1   No current facility-administered medications for this visit.     Review of Systems  Please see the history of present illness.    (+) Shortness of breath with exertion (+) Trace lower extremity edema  All other systems reviewed and are otherwise negative except as noted above.  Physical Exam    Wt Readings from Last 3 Encounters:  06/09/22 182 lb (82.6 kg)  03/11/22 171 lb 1.2 oz (77.6 kg)  03/03/22 171 lb (77.6 kg)   VS: Vitals:   06/09/22 1347  BP: 138/68  Pulse: 94  SpO2: 97%  ,Body mass index is 29.83 kg/m.  Constitutional:      Appearance: Healthy appearance. Not in distress.  Neck:     Vascular: JVD normal.  Pulmonary:     Effort: Pulmonary effort is normal.     Breath sounds: No wheezing. No rales. Diminished in the bases Cardiovascular:     Irregularly irregular normal S1. Normal S2.      Murmurs: There is no murmur.  Edema:    Peripheral edema absent.  Abdominal:     Palpations: Abdomen is soft non tender. There is no hepatomegaly.  Skin:    General: Skin is warm and dry.   Neurological:     General: No focal deficit present.     Mental Status: Alert and oriented to person, place and time.     Cranial Nerves: Cranial nerves are intact.  EKG/LABS/Other Studies Reviewed    ECG personally reviewed by me today -none completed today  Risk Assessment/Calculations:    CHA2DS2-VASc Score = 5   This indicates a 7.2% annual risk of stroke. The patient's score is based upon: CHF History: 1 HTN History: 1 Diabetes History: 0 Stroke History: 0 Vascular Disease History: 0 Age Score: 2 Gender Score: 1      Lab Results  Component Value Date   WBC 5.0 03/03/2022   HGB 11.9 (L) 03/03/2022   HCT 36.7 03/03/2022   MCV 97.3 03/03/2022   PLT 197 03/03/2022   Lab Results  Component Value Date   CREATININE 0.96 03/03/2022   BUN 38 (H) 03/03/2022   NA 142 03/03/2022   K 4.1 03/03/2022   CL 109 03/03/2022   CO2 26 03/03/2022   Lab Results  Component Value Date   ALT 22 06/08/2015   AST 23 06/08/2015   ALKPHOS 49 06/08/2015   BILITOT 1.1 06/08/2015   No results found for: "CHOL", "HDL", "LDLCALC", "LDLDIRECT", "TRIG", "CHOLHDL"  No results found for: "HGBA1C"  Assessment & Plan    1.  Chronic diastolic CHF: -2D echo completed and 2018 with EF of 55 to 60% -Today patient is euvolemic on examination and reports no issues with orthopnea or shortness of breath -Low sodium diet, fluid restriction <2L, and daily weights encouraged. Educated to contact our office for weight gain of 2 lbs overnight or 5 lbs in one week.   2.  Permanent atrial fibrillation: -Today patient was rate controlled at 94 and denies any associated symptoms with atrial fibrillation. -Continue Toprol-XL 100 mg twice daily -Denies any occult bleeding in urine or stool -Continue Coumadin 5 mg per Coumadin clinic -CHA2DS2-VASc  Score = 5 [CHF History: 1, HTN History: 1, Diabetes History: 0, Stroke History: 0, Vascular Disease History: 0, Age Score: 2, Gender Score: 1].  Therefore, the  patient's annual risk of stroke is 7.2 %.      3.  Essential hypertension: -Patient's blood pressure today was 138/68 -Continue valsartan 160 mg, Toprol-XL 100 mg twice daily  4.  Bilateral leg edema: -Patient has trace lower extremity edema today -She reports no swelling and elevates when edema is present.  Disposition: Follow-up with Lance Muss, MD or APP in 3 months    Medication Adjustments/Labs and Tests Ordered: Current medicines are reviewed at length with the patient today.  Concerns regarding medicines are outlined above.   Signed, Napoleon Form, Leodis Rains, NP 06/09/2022, 3:53 PM  [facility]  Note:  This document was prepared using Dragon voice recognition software and may include unintentional dictation errors.

## 2022-06-09 ENCOUNTER — Ambulatory Visit: Payer: Medicare Other | Attending: Interventional Cardiology | Admitting: Nurse Practitioner

## 2022-06-09 ENCOUNTER — Encounter: Payer: Self-pay | Admitting: Nurse Practitioner

## 2022-06-09 VITALS — BP 138/68 | HR 94 | Ht 65.5 in | Wt 182.0 lb

## 2022-06-09 DIAGNOSIS — R600 Localized edema: Secondary | ICD-10-CM | POA: Insufficient documentation

## 2022-06-09 DIAGNOSIS — I10 Essential (primary) hypertension: Secondary | ICD-10-CM | POA: Insufficient documentation

## 2022-06-09 DIAGNOSIS — I4819 Other persistent atrial fibrillation: Secondary | ICD-10-CM | POA: Insufficient documentation

## 2022-06-09 DIAGNOSIS — I5032 Chronic diastolic (congestive) heart failure: Secondary | ICD-10-CM | POA: Insufficient documentation

## 2022-06-09 NOTE — Patient Instructions (Signed)
Medication Instructions:  Your physician recommends that you continue on your current medications as directed. Please refer to the Current Medication list given to you today.  *If you need a refill on your cardiac medications before your next appointment, please call your pharmacy*  Follow-Up: At [facility], you and your health needs are our priority.  As part of our continuing mission to provide you with exceptional heart care, we have created designated Provider Care Teams.  These Care Teams include your primary Cardiologist (physician) and Advanced Practice Providers (APPs -  Physician Assistants and Nurse Practitioners) who all work together to provide you with the care you need, when you need it.  Your next appointment:   3 month(s)  The format for your next appointment:   In Person  Provider:   Larae Grooms, MD      Important Information About Sugar

## 2022-06-10 DIAGNOSIS — M25612 Stiffness of left shoulder, not elsewhere classified: Secondary | ICD-10-CM | POA: Diagnosis not present

## 2022-06-10 DIAGNOSIS — Z96612 Presence of left artificial shoulder joint: Secondary | ICD-10-CM | POA: Diagnosis not present

## 2022-06-10 DIAGNOSIS — M6281 Muscle weakness (generalized): Secondary | ICD-10-CM | POA: Diagnosis not present

## 2022-06-16 DIAGNOSIS — M6281 Muscle weakness (generalized): Secondary | ICD-10-CM | POA: Diagnosis not present

## 2022-06-16 DIAGNOSIS — Z96612 Presence of left artificial shoulder joint: Secondary | ICD-10-CM | POA: Diagnosis not present

## 2022-06-16 DIAGNOSIS — M25612 Stiffness of left shoulder, not elsewhere classified: Secondary | ICD-10-CM | POA: Diagnosis not present

## 2022-06-17 DIAGNOSIS — M6281 Muscle weakness (generalized): Secondary | ICD-10-CM | POA: Diagnosis not present

## 2022-06-17 DIAGNOSIS — L821 Other seborrheic keratosis: Secondary | ICD-10-CM | POA: Diagnosis not present

## 2022-06-17 DIAGNOSIS — L570 Actinic keratosis: Secondary | ICD-10-CM | POA: Diagnosis not present

## 2022-06-17 DIAGNOSIS — Z96612 Presence of left artificial shoulder joint: Secondary | ICD-10-CM | POA: Diagnosis not present

## 2022-06-17 DIAGNOSIS — M25612 Stiffness of left shoulder, not elsewhere classified: Secondary | ICD-10-CM | POA: Diagnosis not present

## 2022-06-18 ENCOUNTER — Ambulatory Visit: Payer: Medicare Other | Attending: Interventional Cardiology | Admitting: *Deleted

## 2022-06-18 DIAGNOSIS — Z5181 Encounter for therapeutic drug level monitoring: Secondary | ICD-10-CM | POA: Diagnosis not present

## 2022-06-18 DIAGNOSIS — M6281 Muscle weakness (generalized): Secondary | ICD-10-CM | POA: Diagnosis not present

## 2022-06-18 DIAGNOSIS — M25612 Stiffness of left shoulder, not elsewhere classified: Secondary | ICD-10-CM | POA: Diagnosis not present

## 2022-06-18 DIAGNOSIS — I480 Paroxysmal atrial fibrillation: Secondary | ICD-10-CM | POA: Diagnosis not present

## 2022-06-18 DIAGNOSIS — Z96612 Presence of left artificial shoulder joint: Secondary | ICD-10-CM | POA: Diagnosis not present

## 2022-06-18 LAB — POCT INR: POC INR: 2.8

## 2022-06-18 NOTE — Patient Instructions (Signed)
Description   continue taking 1/2 tablet daily except 1 tablet on Mondays, Wednesdays and Fridays. Recheck INR in 5 weeks. Call with any new medications, any new procedures, and with any concerns  [phone]

## 2022-06-22 DIAGNOSIS — Z96612 Presence of left artificial shoulder joint: Secondary | ICD-10-CM | POA: Diagnosis not present

## 2022-06-22 DIAGNOSIS — M25612 Stiffness of left shoulder, not elsewhere classified: Secondary | ICD-10-CM | POA: Diagnosis not present

## 2022-06-22 DIAGNOSIS — M6281 Muscle weakness (generalized): Secondary | ICD-10-CM | POA: Diagnosis not present

## 2022-06-24 DIAGNOSIS — Z96612 Presence of left artificial shoulder joint: Secondary | ICD-10-CM | POA: Diagnosis not present

## 2022-06-24 DIAGNOSIS — M25612 Stiffness of left shoulder, not elsewhere classified: Secondary | ICD-10-CM | POA: Diagnosis not present

## 2022-06-24 DIAGNOSIS — M6281 Muscle weakness (generalized): Secondary | ICD-10-CM | POA: Diagnosis not present

## 2022-07-01 DIAGNOSIS — M6281 Muscle weakness (generalized): Secondary | ICD-10-CM | POA: Diagnosis not present

## 2022-07-01 DIAGNOSIS — Z96612 Presence of left artificial shoulder joint: Secondary | ICD-10-CM | POA: Diagnosis not present

## 2022-07-01 DIAGNOSIS — M25612 Stiffness of left shoulder, not elsewhere classified: Secondary | ICD-10-CM | POA: Diagnosis not present

## 2022-07-23 ENCOUNTER — Ambulatory Visit: Payer: Medicare Other | Attending: Cardiovascular Disease | Admitting: *Deleted

## 2022-07-23 DIAGNOSIS — I480 Paroxysmal atrial fibrillation: Secondary | ICD-10-CM

## 2022-07-23 DIAGNOSIS — Z5181 Encounter for therapeutic drug level monitoring: Secondary | ICD-10-CM

## 2022-07-23 LAB — POCT INR: INR: 3.5 — AB (ref 2.0–3.0)

## 2022-07-23 NOTE — Patient Instructions (Signed)
Description   Do not take any warfarin today then continue taking 1/2 tablet daily except 1 tablet on Mondays, Wednesdays and Fridays. Recheck INR in 4 weeks. Call with any new medications, any new procedures, and with any concerns  [phone]

## 2022-07-27 DIAGNOSIS — L814 Other melanin hyperpigmentation: Secondary | ICD-10-CM | POA: Diagnosis not present

## 2022-07-27 DIAGNOSIS — L298 Other pruritus: Secondary | ICD-10-CM | POA: Diagnosis not present

## 2022-07-27 DIAGNOSIS — Z85828 Personal history of other malignant neoplasm of skin: Secondary | ICD-10-CM | POA: Diagnosis not present

## 2022-07-27 DIAGNOSIS — Z789 Other specified health status: Secondary | ICD-10-CM | POA: Diagnosis not present

## 2022-07-27 DIAGNOSIS — Z08 Encounter for follow-up examination after completed treatment for malignant neoplasm: Secondary | ICD-10-CM | POA: Diagnosis not present

## 2022-07-27 DIAGNOSIS — L821 Other seborrheic keratosis: Secondary | ICD-10-CM | POA: Diagnosis not present

## 2022-07-27 DIAGNOSIS — R208 Other disturbances of skin sensation: Secondary | ICD-10-CM | POA: Diagnosis not present

## 2022-07-27 DIAGNOSIS — L820 Inflamed seborrheic keratosis: Secondary | ICD-10-CM | POA: Diagnosis not present

## 2022-07-27 DIAGNOSIS — L538 Other specified erythematous conditions: Secondary | ICD-10-CM | POA: Diagnosis not present

## 2022-07-27 DIAGNOSIS — L304 Erythema intertrigo: Secondary | ICD-10-CM | POA: Diagnosis not present

## 2022-07-27 DIAGNOSIS — D1801 Hemangioma of skin and subcutaneous tissue: Secondary | ICD-10-CM | POA: Diagnosis not present

## 2022-07-28 DIAGNOSIS — Z1231 Encounter for screening mammogram for malignant neoplasm of breast: Secondary | ICD-10-CM | POA: Diagnosis not present

## 2022-07-30 DIAGNOSIS — I10 Essential (primary) hypertension: Secondary | ICD-10-CM | POA: Diagnosis not present

## 2022-07-30 DIAGNOSIS — N1831 Chronic kidney disease, stage 3a: Secondary | ICD-10-CM | POA: Diagnosis not present

## 2022-07-30 DIAGNOSIS — I480 Paroxysmal atrial fibrillation: Secondary | ICD-10-CM | POA: Diagnosis not present

## 2022-07-30 DIAGNOSIS — I5032 Chronic diastolic (congestive) heart failure: Secondary | ICD-10-CM | POA: Diagnosis not present

## 2022-07-30 DIAGNOSIS — K219 Gastro-esophageal reflux disease without esophagitis: Secondary | ICD-10-CM | POA: Diagnosis not present

## 2022-07-30 DIAGNOSIS — E7800 Pure hypercholesterolemia, unspecified: Secondary | ICD-10-CM | POA: Diagnosis not present

## 2022-08-09 DIAGNOSIS — L602 Onychogryphosis: Secondary | ICD-10-CM | POA: Diagnosis not present

## 2022-08-09 DIAGNOSIS — I70203 Unspecified atherosclerosis of native arteries of extremities, bilateral legs: Secondary | ICD-10-CM | POA: Diagnosis not present

## 2022-08-18 DIAGNOSIS — M1712 Unilateral primary osteoarthritis, left knee: Secondary | ICD-10-CM | POA: Diagnosis not present

## 2022-08-18 DIAGNOSIS — M170 Bilateral primary osteoarthritis of knee: Secondary | ICD-10-CM | POA: Diagnosis not present

## 2022-08-18 DIAGNOSIS — M1711 Unilateral primary osteoarthritis, right knee: Secondary | ICD-10-CM | POA: Diagnosis not present

## 2022-08-19 ENCOUNTER — Telehealth: Payer: Self-pay | Admitting: *Deleted

## 2022-08-19 NOTE — Telephone Encounter (Signed)
   Name: Sierra [name]  DOB: 09/19/1945  MRN: [id number]  Primary Cardiologist: Larae Grooms, MD  Chart reviewed as part of pre-operative protocol coverage. Because of Sierra [name]'s past medical history and time since last visit, she will require a follow-up telephone visit in order to better assess preoperative cardiovascular risk.  Pre-op covering staff: - Please schedule appointment and call patient to inform them. If patient already had an upcoming appointment within acceptable timeframe, please add "pre-op clearance" to the appointment notes so provider is aware. - Please contact requesting surgeon's office via preferred method (i.e, phone, fax) to inform them of need for appointment prior to surgery.  No medications indicated as needing held   Elgie Collard, [place]  08/19/2022, 5:40 PM

## 2022-08-19 NOTE — Telephone Encounter (Signed)
   Pre-operative Risk Assessment    Patient Name: Sierra [name]  DOB: 07-12-1945 MRN: [id number]      Request for Surgical Clearance    Procedure:   RIGHT KNEE ARTHOPLASTY   Date of Surgery:  Clearance TBD                                 Surgeon:  DR [id number]  Practice Name:  [facility]   Phone number:  [phone]  Fax number:  [phone] ATTN REBECCA LANG    Type of Clearance Requested:   - Medical    Type of Anesthesia:  Spinal   Additional requests/questions:   N/A   Kathleen Argue   08/19/2022, 3:55 PM

## 2022-08-20 ENCOUNTER — Ambulatory Visit: Payer: Medicare Other | Attending: Cardiology | Admitting: *Deleted

## 2022-08-20 DIAGNOSIS — I480 Paroxysmal atrial fibrillation: Secondary | ICD-10-CM | POA: Diagnosis not present

## 2022-08-20 DIAGNOSIS — Z5181 Encounter for therapeutic drug level monitoring: Secondary | ICD-10-CM

## 2022-08-20 LAB — POCT INR: INR: 3.4 — AB (ref 2.0–3.0)

## 2022-08-20 NOTE — Telephone Encounter (Signed)
Pt already has an in office appointment with Dr. Irish Lack 08/25/22, clearance can be addressed at that time.  Will route to requesting surgeon's office to make them aware.

## 2022-08-20 NOTE — Patient Instructions (Signed)
Description   Do not take any warfarin today then START taking 1/2 tablet daily except 1 tablet on Mondays and Fridays. Recheck INR in 3 weeks. Call with any new medications, any new procedures, and with any concerns  [phone] or [phone] Procedure Fax [phone] or [phone]

## 2022-08-24 NOTE — Progress Notes (Unsigned)
Cardiology Office Note   Date:  08/25/2022   ID:  Sierra, [name] 08-27-1945, MRN [id number]  PCP:  Kathalene Frames, MD    No chief complaint on file.  AFib  Wt Readings from Last 3 Encounters:  08/25/22 184 lb 12.8 oz (83.8 kg)  06/09/22 182 lb (82.6 kg)  03/11/22 171 lb 1.2 oz (77.6 kg)       History of Present Illness: Sierra [name] is a 77 y.o. female   Who has HTN, PACs for many years.  In September 2016, she was feeling palpitations thought to be from PACs.  In December 2016, she had a severe GI bug and went to the ER.  She had atrial fibrillation diagnosed in the ambulance.     She had a complicated urinary infection in 2018.  SHe was hospitalized in evaluated with urology.  She had a high fever.    She was started on Coumadin.  SHe is not interested in a DOAC.   Prior monitor showed AFib with RVR.  We decided to pursue DCCV due to recurrent sx. She had a successful cardioversion on Jul 15, 2017.   She felt PACs, but they are manageable.     No problems when she got her COVID vaccines.  She also got shingles vaccine.    Afib resolved after increasing to Metoprolol XL 150 mg daily.     She had a fall and reqires shoulder surgery.  Had hematoma. Atrial fibrillation was noted to have returned in early 2023.  INR has been affected by antibiotics.  Was rate controlled in June 2023.  Metoprolol was continued.  No further testing is needed before knee surgery in 2023.  Knee surgery was supposed to happen in 2023 but did not.  She appeared to be euvolemic while in atrial fibrillation in 2023.   Knee surgery delayed due to husbands health problem.    Moving around the house without any symptoms.  Can go up stairs to get in the house.    Denies : Chest pain. Dizziness. Leg edema. Nitroglycerin use. Orthopnea. Palpitations. Paroxysmal nocturnal dyspnea. Shortness of breath. Syncope.      Past Medical History:  Diagnosis Date   Anxiety    Arthritis    "joints"  (09/19/2013)   Atrial fibrillation (HCC)    Basal cell carcinoma of cheek    left   CHF (congestive heart failure) (HCC)    Chronic lower back pain    Dysrhythmia    GERD (gastroesophageal reflux disease)    H/O hiatal hernia    Hypertension    Migraine    "haven't had any in a long time" (09/19/2013)   Pneumonia ~ 2012    Past Surgical History:  Procedure Laterality Date   BASAL CELL CARCINOMA EXCISION Left    "cheek"   CARDIOVERSION N/A 07/15/2017   Procedure: CARDIOVERSION;  Surgeon: Thayer Headings, MD;  Location: [facility];  Service: Cardiovascular;  Laterality: N/A;   CHOLECYSTECTOMY  02/19/1989   COLONOSCOPY WITH PROPOFOL N/A 07/05/2016   Procedure: COLONOSCOPY WITH PROPOFOL;  Surgeon: Garlan Fair, MD;  Location: [facility] ENDOSCOPY;  Service: Endoscopy;  Laterality: N/A;   DILATION AND CURETTAGE OF UTERUS     HERNIA REPAIR  [id number]   umbilical   JOINT REPLACEMENT     MOHS SURGERY     OOPHORECTOMY Bilateral    REVERSE SHOULDER ARTHROPLASTY Left 03/11/2022   Procedure: REVERSE SHOULDER ARTHROPLASTY;  Surgeon: Tania Ade, MD;  Location:  [facility] ORS;  Service: Orthopedics;  Laterality: Left;   REVISION TOTAL HIP ARTHROPLASTY Left 09/19/2013   SHOULDER ARTHROSCOPY W/ ROTATOR CUFF REPAIR Right    TEMPOROMANDIBULAR JOINT SURGERY     TMJ ARTHROPLASTY     TOTAL HIP ARTHROPLASTY Left 06/22/2007   TOTAL HIP ARTHROPLASTY Right 06/21/2009   TOTAL HIP REVISION Left 09/19/2013   Procedure: LEFT TOTAL HIP REVISION;  Surgeon: Kerin Salen, MD;  Location: [facility];  Service: Orthopedics;  Laterality: Left;   TUBAL LIGATION     VAGINAL HYSTERECTOMY  02/19/1989     Current Outpatient Medications  Medication Sig Dispense Refill   amoxicillin (AMOXIL) 500 MG capsule as directed. Dental procedures only     Cholecalciferol (VITAMIN D-3) 5000 UNITS TABS Take 5,000 Units by mouth every Monday, Wednesday, and Friday.      citalopram (CELEXA) 20 MG tablet Take 20 mg by mouth daily.      Coenzyme Q10 (CO Q-10) 100 MG CAPS Take 100 mg by mouth daily.     colestipol (COLESTID) 5 g packet Take 5 g by mouth 2 (two) times daily.   3   fexofenadine (ALLEGRA) 180 MG tablet Take 180 mg by mouth at bedtime.     Magnesium 250 MG TABS Take 250 mg by mouth every Monday, Wednesday, and Friday.     Menthol, Topical Analgesic, (BIOFREEZE) 4 % GEL Apply 1 application. topically daily as needed (pain).     metoprolol succinate (TOPROL-XL) 50 MG 24 hr tablet Take 2 tablets (100 mg total) by mouth 2 (two) times daily. 360 tablet 3   Multiple Vitamins-Minerals (MEMORY VITE PO) Take 3 tablets by mouth daily. Memory plus     Multiple Vitamins-Minerals (MULTIVITAMIN WITH MINERALS) tablet Take 1 tablet by mouth daily. 50+ for her     Multiple Vitamins-Minerals (PRESERVISION AREDS 2) CAPS Take 1 capsule by mouth in the morning and at bedtime.     potassium chloride (KLOR-CON) 10 MEQ tablet Take 10 mEq by mouth daily.     RABEprazole (ACIPHEX) 20 MG tablet Take 20 mg by mouth daily.      torsemide (DEMADEX) 20 MG tablet Take 20 mg by mouth daily.     valsartan (DIOVAN) 160 MG tablet Take 160 mg by mouth daily.     warfarin (COUMADIN) 5 MG tablet TAKE AS DIRECTED BY COUMADIN CLINIC 80 tablet 1   No current facility-administered medications for this visit.    Allergies:   Ace inhibitors, Codeine, Erythromycin, Iohexol, and Iodinated contrast media    Social History:  The patient  reports that she has never smoked. She has never used smokeless tobacco. She reports current alcohol use. She reports that she does not use drugs.   Family History:  The patient's family history includes Heart attack in her maternal aunt, mother, and paternal uncle.    ROS:  Please see the history of present illness.   Otherwise, review of systems are positive for knee pain.   All other systems are reviewed and negative.    PHYSICAL EXAM: VS:  BP 138/70   Pulse 92   Ht '5\' 5"'$  (1.651 m)   Wt 184 lb 12.8 oz (83.8 kg)    SpO2 95%   BMI 30.75 kg/m  , BMI Body mass index is 30.75 kg/m. GEN: Well nourished, well developed, in no acute distress HEENT: normal Neck: no JVD, carotid bruits, or masses Cardiac: irregularly irregular; no murmurs, rubs, or gallops,tr LE edema  Respiratory:  clear to auscultation bilaterally,  normal work of breathing GI: soft, nontender, nondistended, + BS MS: no deformity or atrophy Skin: warm and dry, lower extremity discoloration consistent with venous insufficiency Neuro:  Strength and sensation are intact Psych: euthymic mood, full affect   EKG:   The ekg ordered today demonstrates AFib, rate controlled   Recent Labs: 09/10/2021: Magnesium 2.2 03/03/2022: BUN 38; Creatinine, Ser 0.96; Hemoglobin 11.9; Platelets 197; Potassium 4.1; Sodium 142   Lipid Panel No results found for: "CHOL", "TRIG", "HDL", "CHOLHDL", "VLDL", "LDLCALC", "LDLDIRECT"   Other studies Reviewed: Additional studies/ records that were reviewed today with results demonstrating: labs reviewed,INR 3.4 on 08/20/22; LDL 101 in 2021.   ASSESSMENT AND PLAN:  Atrial fibrillation: Controlled.  Coumadin for stroke prevention.  No further cardiac testing needed before knee replacement and 2024. Anticoagulated: No bleeding problems.  Chronic diastolic heart failure: Appears euvolemic.  Hypertension: The current medical regimen is effective;  continue present plan and medications.  Home readings reviewed and well controlled.  120/78.  Leg edema: Elevate legs.  Compression stocking.  Used metolazone in the past.    Current medicines are reviewed at length with the patient today.  The patient concerns regarding her medicines were addressed.  The following changes have been made:  No change  Labs/ tests ordered today include:  No orders of the defined types were placed in this encounter.   Recommend 150 minutes/week of aerobic exercise Low fat, low carb, high fiber diet recommended  Disposition:   FU in 6  months with APP.    Signed, Larae Grooms, MD  08/25/2022 2:42 PM    [facility] [address], [place], [place]  [address] Phone: [phone]; Fax: [phone]

## 2022-08-25 ENCOUNTER — Encounter: Payer: Self-pay | Admitting: Interventional Cardiology

## 2022-08-25 ENCOUNTER — Ambulatory Visit: Payer: Medicare Other | Attending: Interventional Cardiology | Admitting: Interventional Cardiology

## 2022-08-25 VITALS — BP 138/70 | HR 92 | Ht 65.0 in | Wt 184.8 lb

## 2022-08-25 DIAGNOSIS — I4821 Permanent atrial fibrillation: Secondary | ICD-10-CM | POA: Diagnosis not present

## 2022-08-25 DIAGNOSIS — I10 Essential (primary) hypertension: Secondary | ICD-10-CM

## 2022-08-25 DIAGNOSIS — I5032 Chronic diastolic (congestive) heart failure: Secondary | ICD-10-CM | POA: Diagnosis not present

## 2022-08-25 DIAGNOSIS — R600 Localized edema: Secondary | ICD-10-CM | POA: Diagnosis not present

## 2022-08-25 DIAGNOSIS — Z7901 Long term (current) use of anticoagulants: Secondary | ICD-10-CM | POA: Diagnosis not present

## 2022-08-25 NOTE — Patient Instructions (Signed)
Medication Instructions:  Your physician recommends that you continue on your current medications as directed. Please refer to the Current Medication list given to you today.  *If you need a refill on your cardiac medications before your next appointment, please call your pharmacy*   Lab Work: none If you have labs (blood work) drawn today and your tests are completely normal, you will receive your results only by: [place] (if you have MyChart) OR A paper copy in the mail If you have any lab test that is abnormal or we need to change your treatment, we will call you to review the results.   Testing/Procedures:  none   Follow-Up: At [facility], you and your health needs are our priority.  As part of our continuing mission to provide you with exceptional heart care, we have created designated Provider Care Teams.  These Care Teams include your primary Cardiologist (physician) and Advanced Practice Providers (APPs -  Physician Assistants and Nurse Practitioners) who all work together to provide you with the care you need, when you need it.  We recommend signing up for the patient portal called "MyChart".  Sign up information is provided on this After Visit Summary.  MyChart is used to connect with patients for Virtual Visits (Telemedicine).  Patients are able to view lab/test results, encounter notes, upcoming appointments, etc.  Non-urgent messages can be sent to your provider as well.   To learn more about what you can do with MyChart, go to [email or web address].    Your next appointment:   6 month(s)  Provider:   Nicholes Rough, PA-C, Melina Copa, PA-C, Ambrose Pancoast, NP, Ermalinda Barrios, PA-C, Christen Bame, NP, or Richardson Dopp, PA-C         Other Instructions

## 2022-08-26 DIAGNOSIS — Z79899 Other long term (current) drug therapy: Secondary | ICD-10-CM | POA: Diagnosis not present

## 2022-08-26 DIAGNOSIS — Z23 Encounter for immunization: Secondary | ICD-10-CM | POA: Diagnosis not present

## 2022-08-26 DIAGNOSIS — K219 Gastro-esophageal reflux disease without esophagitis: Secondary | ICD-10-CM | POA: Diagnosis not present

## 2022-08-26 DIAGNOSIS — N1831 Chronic kidney disease, stage 3a: Secondary | ICD-10-CM | POA: Diagnosis not present

## 2022-08-26 DIAGNOSIS — Z01818 Encounter for other preprocedural examination: Secondary | ICD-10-CM | POA: Diagnosis not present

## 2022-08-26 DIAGNOSIS — Z1331 Encounter for screening for depression: Secondary | ICD-10-CM | POA: Diagnosis not present

## 2022-08-26 DIAGNOSIS — E7800 Pure hypercholesterolemia, unspecified: Secondary | ICD-10-CM | POA: Diagnosis not present

## 2022-08-26 DIAGNOSIS — D649 Anemia, unspecified: Secondary | ICD-10-CM | POA: Diagnosis not present

## 2022-08-26 DIAGNOSIS — M159 Polyosteoarthritis, unspecified: Secondary | ICD-10-CM | POA: Diagnosis not present

## 2022-08-26 DIAGNOSIS — M19012 Primary osteoarthritis, left shoulder: Secondary | ICD-10-CM | POA: Diagnosis not present

## 2022-08-26 DIAGNOSIS — Z0000 Encounter for general adult medical examination without abnormal findings: Secondary | ICD-10-CM | POA: Diagnosis not present

## 2022-08-26 DIAGNOSIS — I10 Essential (primary) hypertension: Secondary | ICD-10-CM | POA: Diagnosis not present

## 2022-08-26 DIAGNOSIS — D6869 Other thrombophilia: Secondary | ICD-10-CM | POA: Diagnosis not present

## 2022-08-26 DIAGNOSIS — I480 Paroxysmal atrial fibrillation: Secondary | ICD-10-CM | POA: Diagnosis not present

## 2022-08-26 DIAGNOSIS — I5032 Chronic diastolic (congestive) heart failure: Secondary | ICD-10-CM | POA: Diagnosis not present

## 2022-08-27 NOTE — Addendum Note (Signed)
Addended by: Sharee Holster R on: 08/27/2022 07:43 AM   Modules accepted: Orders

## 2022-08-30 DIAGNOSIS — M25512 Pain in left shoulder: Secondary | ICD-10-CM | POA: Diagnosis not present

## 2022-08-31 NOTE — Telephone Encounter (Signed)
   Patient Name: Sierra [name]  DOB: [birth year] MRN: [id number]  Primary Cardiologist: Larae Grooms, MD  Clinical pharmacists have reviewed the patient's past medical history, labs, and current medications as part of preoperative protocol coverage. The following recommendations have been made:  Per office protocol, patient can hold warfarin for 5 days prior to procedure.   Patient will not need bridging with Lovenox (enoxaparin) around procedure.  I will route this recommendation to the requesting party via Epic fax function and remove from pre-op pool.  Please call with questions.  Mable Fill, Marissa Nestle, NP 08/31/2022, 4:53 PM

## 2022-08-31 NOTE — Telephone Encounter (Signed)
Patient with diagnosis of atrial fibrillation on warfarin for anticoagulation.    Procedure: right knee arthroplasty Date of procedure: 09/21/22 (per patient)   CHA2DS2-VASc Score = 5   This indicates a 7.2% annual risk of stroke. The patient's score is based upon: CHF History: 1 HTN History: 1 Diabetes History: 0 Stroke History: 0 Vascular Disease History: 0 Age Score: 2 Gender Score: 1    CrCl 63 Platelet count 197  Per office protocol, patient can hold warfarin for 5 days prior to procedure.   Patient will not need bridging with Lovenox (enoxaparin) around procedure.  **This guidance is not considered finalized until pre-operative APP has relayed final recommendations.**

## 2022-08-31 NOTE — Telephone Encounter (Signed)
Pt called stating her knee surgery has been scheduled for 09/21/22.  Pt has seen Dr Irish Lack on 08/25/22 for medical clearance, but I do not see where pt has been cleared to hold her Warfarin.  Pt has a follow-up appt scheduled already in the [facility] on 09/10/22.

## 2022-09-01 ENCOUNTER — Other Ambulatory Visit: Payer: Self-pay | Admitting: Orthopaedic Surgery

## 2022-09-03 DIAGNOSIS — H26492 Other secondary cataract, left eye: Secondary | ICD-10-CM | POA: Diagnosis not present

## 2022-09-03 DIAGNOSIS — K219 Gastro-esophageal reflux disease without esophagitis: Secondary | ICD-10-CM | POA: Diagnosis not present

## 2022-09-03 DIAGNOSIS — H43813 Vitreous degeneration, bilateral: Secondary | ICD-10-CM | POA: Diagnosis not present

## 2022-09-03 DIAGNOSIS — I5032 Chronic diastolic (congestive) heart failure: Secondary | ICD-10-CM | POA: Diagnosis not present

## 2022-09-03 DIAGNOSIS — I10 Essential (primary) hypertension: Secondary | ICD-10-CM | POA: Diagnosis not present

## 2022-09-03 DIAGNOSIS — E7800 Pure hypercholesterolemia, unspecified: Secondary | ICD-10-CM | POA: Diagnosis not present

## 2022-09-03 DIAGNOSIS — H353132 Nonexudative age-related macular degeneration, bilateral, intermediate dry stage: Secondary | ICD-10-CM | POA: Diagnosis not present

## 2022-09-03 DIAGNOSIS — M19012 Primary osteoarthritis, left shoulder: Secondary | ICD-10-CM | POA: Diagnosis not present

## 2022-09-03 DIAGNOSIS — I480 Paroxysmal atrial fibrillation: Secondary | ICD-10-CM | POA: Diagnosis not present

## 2022-09-03 DIAGNOSIS — N1831 Chronic kidney disease, stage 3a: Secondary | ICD-10-CM | POA: Diagnosis not present

## 2022-09-09 DIAGNOSIS — M25661 Stiffness of right knee, not elsewhere classified: Secondary | ICD-10-CM | POA: Diagnosis not present

## 2022-09-09 DIAGNOSIS — M1731 Unilateral post-traumatic osteoarthritis, right knee: Secondary | ICD-10-CM | POA: Diagnosis not present

## 2022-09-09 DIAGNOSIS — R262 Difficulty in walking, not elsewhere classified: Secondary | ICD-10-CM | POA: Diagnosis not present

## 2022-09-10 ENCOUNTER — Ambulatory Visit: Payer: Medicare Other | Attending: Cardiology | Admitting: Pharmacist

## 2022-09-10 DIAGNOSIS — I480 Paroxysmal atrial fibrillation: Secondary | ICD-10-CM | POA: Diagnosis not present

## 2022-09-10 DIAGNOSIS — Z5181 Encounter for therapeutic drug level monitoring: Secondary | ICD-10-CM

## 2022-09-10 LAB — POCT INR: INR: 2.7 (ref 2.0–3.0)

## 2022-09-10 NOTE — Patient Instructions (Signed)
Description   Continue taking 1/2 tablet daily except 1 tablet on Mondays and Fridays. Start holding warfarin on 3/28.  Recheck 1 week after procedure if possible. Call with any new medications, any new procedures, and with any concerns  [phone] or [phone] Procedure Fax [phone] or [phone]

## 2022-09-15 NOTE — Care Plan (Signed)
Ortho Bundle Case Management Note  Patient Details  Name: Sierra [name] MRN: [id number] Date of Birth: [birth year]  Patient will discharge to home with family to assist. Rolling walker ordered for home use. OPPT set up with [facility]. Patient and MD in agreement with plan. Choice offered                     DME Arranged:  Walker rolling DME Agency:  [employer]  HH Arranged:    [place] Agency:     Additional Comments: Please contact me with any questions of if this plan should need to change.  Ladell Heads,  [place] Specialist  [phone] 09/15/2022, 1:47 PM

## 2022-09-16 ENCOUNTER — Encounter (HOSPITAL_COMMUNITY): Payer: Self-pay

## 2022-09-16 ENCOUNTER — Other Ambulatory Visit: Payer: Self-pay

## 2022-09-16 ENCOUNTER — Encounter (HOSPITAL_COMMUNITY)
Admission: RE | Admit: 2022-09-16 | Discharge: 2022-09-16 | Disposition: A | Discharge status: Home / Self Care | Payer: Medicare Other | Source: Ambulatory Visit | Attending: Orthopaedic Surgery | Admitting: Orthopaedic Surgery

## 2022-09-16 VITALS — BP 154/78 | HR 92 | Temp 98.4°F | Resp 14 | Ht 65.5 in | Wt 180.0 lb

## 2022-09-16 DIAGNOSIS — R7303 Prediabetes: Secondary | ICD-10-CM | POA: Insufficient documentation

## 2022-09-16 DIAGNOSIS — Z01812 Encounter for preprocedural laboratory examination: Secondary | ICD-10-CM | POA: Diagnosis not present

## 2022-09-16 DIAGNOSIS — I5032 Chronic diastolic (congestive) heart failure: Secondary | ICD-10-CM | POA: Diagnosis not present

## 2022-09-16 DIAGNOSIS — Z01818 Encounter for other preprocedural examination: Secondary | ICD-10-CM

## 2022-09-16 LAB — BASIC METABOLIC PANEL
Anion gap: 7 (ref 5–15)
BUN: 36 mg/dL — ABNORMAL HIGH (ref 8–23)
CO2: 25 mmol/L (ref 22–32)
Calcium: 9.2 mg/dL (ref 8.9–10.3)
Chloride: 107 mmol/L (ref 98–111)
Creatinine, Ser: 0.97 mg/dL (ref 0.44–1.00)
GFR, Estimated: 60 mL/min — ABNORMAL LOW (ref >60)
Glucose, Bld: 108 mg/dL — ABNORMAL HIGH (ref 70–99)
Potassium: 4.3 mmol/L (ref 3.5–5.1)
Sodium: 139 mmol/L (ref 135–145)

## 2022-09-16 LAB — CBC
HCT: 38.1 % (ref 36.0–46.0)
Hemoglobin: 12.5 g/dL (ref 12.0–15.0)
MCH: 31.9 pg (ref 26.0–34.0)
MCHC: 32.8 g/dL (ref 30.0–36.0)
MCV: 97.2 fL (ref 80.0–100.0)
Platelets: 215 10*3/uL (ref 150–400)
RBC: 3.92 MIL/uL (ref 3.87–5.11)
RDW: 13.2 % (ref 11.5–15.5)
WBC: 5.2 10*3/uL (ref 4.0–10.5)
nRBC: 0 % (ref 0.0–0.2)

## 2022-09-16 LAB — SURGICAL PCR SCREEN
MRSA, PCR: NEGATIVE
Staphylococcus aureus: NEGATIVE

## 2022-09-16 NOTE — Patient Instructions (Signed)
SURGICAL WAITING ROOM VISITATION  Patients having surgery or a procedure may have no more than 2 support people in the waiting area - these visitors may rotate.    Children under the age of 66 must have an adult with them who is not the patient.  Due to an increase in RSV and influenza rates and associated hospitalizations, children ages 66 and under may not visit patients in [place].  If the patient needs to stay at the hospital during part of their recovery, the visitor guidelines for inpatient rooms apply. Pre-op nurse will coordinate an appropriate time for 1 support person to accompany patient in pre-op.  This support person may not rotate.    Please refer to the [facility] website for the visitor guidelines for Inpatients (after your surgery is over and you are in a regular room).    Your procedure is scheduled on: 09/21/22   [facility]    Report to admitting at 7:15 AM   Call this number if you have problems the morning of surgery [phone]   Do not eat food :After Midnight.   After Midnight you may have the following liquids until 6:45 AM DAY OF SURGERY  Water Non-Citrus Juices (without pulp, NO RED-Apple, White grape, White cranberry) Black Coffee (NO MILK/CREAM OR CREAMERS, sugar ok)  Clear Tea (NO MILK/CREAM OR CREAMERS, sugar ok) regular and decaf                             Plain Jell-O (NO RED)                                           Fruit ices (not with fruit pulp, NO RED)                                     Popsicles (NO RED)                                                               Sports drinks like Gatorade (NO RED)              The day of surgery:  Drink ONE (1) Pre-Surgery Clear Ensure at 6:45 AM the morning of surgery. Drink in one sitting. Do not sip.  This drink was given to you during your hospital  pre-op appointment visit. Nothing else to drink after completing the  Pre-Surgery Clear Ensure.           If you have questions, please contact your surgeon's office.   FOLLOW BOWEL PREP AND ANY ADDITIONAL PRE OP INSTRUCTIONS YOU RECEIVED FROM YOUR SURGEON'S OFFICE!!!     Oral Hygiene is also important to reduce your risk of infection.                                    Remember - BRUSH YOUR TEETH THE MORNING OF SURGERY WITH YOUR REGULAR TOOTHPASTE  DENTURES WILL BE REMOVED PRIOR  TO SURGERY PLEASE DO NOT APPLY "Poly grip" OR ADHESIVES!!!   Take these medicines the morning of surgery with A SIP OF WATER: Citalopram, Metoprolol, Torsemide   These are anesthesia recommendations for holding your anticoagulants.  Please contact your prescribing physician to confirm IF it is safe to hold your anticoagulants for this length of time.   Eliquis Apixaban   72 hours   Xarelto Rivaroxaban   72 hours  Plavix Clopidogrel   120 hours  Pletal Cilostazol   120 hours                                You may not have any metal on your body including hair pins, jewelry, and body piercing             Do not wear make-up, lotions, powders, perfumes, or deodorant  Do not wear nail polish including gel and S&S, artificial/acrylic nails, or any other type of covering on natural nails including finger and toenails. If you have artificial nails, gel coating, etc. that needs to be removed by a nail salon please have this removed prior to surgery or surgery may need to be canceled/ delayed if the surgeon/ anesthesia feels like they are unable to be safely monitored.   Do not shave  48 hours prior to surgery.    Do not bring valuables to the hospital. [place].   Contacts, glasses, dentures or bridgework may not be worn into surgery.   Bring small overnight bag day of surgery.   DO NOT [place]. PHARMACY WILL DISPENSE MEDICATIONS LISTED ON YOUR MEDICATION LIST TO YOU DURING YOUR ADMISSION [place]!   Special  Instructions: Bring a copy of your healthcare power of attorney and living will documents the day of surgery if you haven't scanned them before.              Please read over the following fact sheets you were given: IF [place] [phone] Schneiders    If you received a COVID test during your pre-op visit  it is requested that you wear a mask when out in public, stay away from anyone that may not be feeling well and notify your surgeon if you develop symptoms. If you test positive for Covid or have been in contact with anyone that has tested positive in the last 10 days please notify you surgeon.    [facility] - Preparing for Surgery Before surgery, you can play an important role.  Because skin is not sterile, your skin needs to be as free of germs as possible.  You can reduce the number of germs on your skin by washing with CHG (chlorahexidine gluconate) soap before surgery.  CHG is an antiseptic cleaner which kills germs and bonds with the skin to continue killing germs even after washing. Please DO NOT use if you have an allergy to CHG or antibacterial soaps.  If your skin becomes reddened/irritated stop using the CHG and inform your nurse when you arrive at Short Stay. Do not shave (including legs and underarms) for at least 48 hours prior to the first CHG shower.  You may shave your face/neck.  Please follow these instructions carefully:  1.  Shower with CHG Soap the night before  surgery and the  morning of surgery.  2.  If you choose to wash your hair, wash your hair first as usual with your normal  shampoo.  3.  After you shampoo, rinse your hair and body thoroughly to remove the shampoo.                             4.  Use CHG as you would any other liquid soap.  You can apply chg directly to the skin and wash.  Gently with a scrungie or clean washcloth.  5.  Apply the CHG Soap to your body ONLY FROM THE NECK DOWN.   Do   not use on face/  open                           Wound or open sores. Avoid contact with eyes, ears mouth and   genitals (private parts).                       Wash face,  Genitals (private parts) with your normal soap.             6.  Wash thoroughly, paying special attention to the area where your    surgery  will be performed.  7.  Thoroughly rinse your body with warm water from the neck down.  8.  DO NOT shower/wash with your normal soap after using and rinsing off the CHG Soap.                9.  Pat yourself dry with a clean towel.            10.  Wear clean pajamas.            11.  Place clean sheets on your bed the night of your first shower and do not  sleep with pets. Day of Surgery : Do not apply any lotions/deodorants the morning of surgery.  Please wear clean clothes to the hospital/surgery center.  FAILURE TO FOLLOW THESE INSTRUCTIONS MAY RESULT IN THE CANCELLATION OF YOUR SURGERY  PATIENT SIGNATURE_________________________________  NURSE SIGNATURE__________________________________  ________________________________________________________________________  Adam Phenix  An incentive spirometer is a tool that can help keep your lungs clear and active. This tool measures how well you are filling your lungs with each breath. Taking long deep breaths may help reverse or decrease the chance of developing breathing (pulmonary) problems (especially infection) following: A long period of time when you are unable to move or be active. BEFORE THE PROCEDURE  If the spirometer includes an indicator to show your best effort, your nurse or respiratory therapist will set it to a desired goal. If possible, sit up straight or lean slightly forward. Try not to slouch. Hold the incentive spirometer in an upright position. INSTRUCTIONS FOR USE  Sit on the edge of your bed if possible, or sit up as far as you can in bed or on a chair. Hold the incentive spirometer in an upright position. Breathe out  normally. Place the mouthpiece in your mouth and seal your lips tightly around it. Breathe in slowly and as deeply as possible, raising the piston or the ball toward the top of the column. Hold your breath for 3-5 seconds or for as long as possible. Allow the piston or ball to fall to the bottom of the column. Remove the mouthpiece from your mouth and breathe  out normally. Rest for a few seconds and repeat Steps 1 through 7 at least 10 times every 1-2 hours when you are awake. Take your time and take a few normal breaths between deep breaths. The spirometer may include an indicator to show your best effort. Use the indicator as a goal to work toward during each repetition. After each set of 10 deep breaths, practice coughing to be sure your lungs are clear. If you have an incision (the cut made at the time of surgery), support your incision when coughing by placing a pillow or rolled up towels firmly against it. Once you are able to get out of bed, walk around indoors and cough well. You may stop using the incentive spirometer when instructed by your caregiver.  RISKS AND COMPLICATIONS Take your time so you do not get dizzy or light-headed. If you are in pain, you may need to take or ask for pain medication before doing incentive spirometry. It is harder to take a deep breath if you are having pain. AFTER USE Rest and breathe slowly and easily. It can be helpful to keep track of a log of your progress. Your caregiver can provide you with a simple table to help with this. If you are using the spirometer at home, follow these instructions: [place] IF:  You are having difficultly using the spirometer. You have trouble using the spirometer as often as instructed. Your pain medication is not giving enough relief while using the spirometer. You develop fever of 100.5 F (38.1 C) or higher. SEEK IMMEDIATE MEDICAL CARE IF:  You cough up bloody sputum that had not been present before. You  develop fever of 102 F (38.9 C) or greater. You develop worsening pain at or near the incision site. MAKE SURE YOU:  Understand these instructions. Will watch your condition. Will get help right away if you are not doing well or get worse. Document Released: 10/18/2006 Document Revised: 08/30/2011 Document Reviewed: 12/19/2006 [facility] Patient Information 2014 [place], [place].   ________________________________________________________________________

## 2022-09-16 NOTE — Progress Notes (Addendum)
COVID Vaccine Completed: yes  Date of COVID positive in last 90 days: no  PCP - Okey Dupre, MD Cardiologist - Larae Grooms, MD  Cardiac clearance by Dr. Irish Lack 08/25/22 in Epic   Chest x-ray - 03/04/22 Epic EKG - 08/25/22 Epic Stress Test - n/a ECHO - 2018 Cardiac Cath - n/a Pacemaker/ICD device last checked: n/a Spinal Cord Stimulator: n/a  Bowel Prep - no  Sleep Study - n/a CPAP -   Fasting Blood Sugar - pre DM Checks Blood Sugar no checks at home  Last dose of GLP1 agonist-  N/A GLP1 instructions:  N/A   Last dose of SGLT-2 inhibitors-  N/A SGLT-2 instructions: N/A   Blood Thinner Instructions: Warfarin, hold 5 days, no bridging  Aspirin Instructions: Last Dose: 09/15/22 1300  Activity level: Can go up a flight of stairs and perform activities of daily living without stopping and without symptoms of chest pain or shortness of breath.   Anesthesia review: PAF, HTN, CHF  Patient denies shortness of breath, fever, cough and chest pain at PAT appointment  Patient verbalized understanding of instructions that were given to them at the PAT appointment. Patient was also instructed that they will need to review over the PAT instructions again at home before surgery.

## 2022-09-17 LAB — HEMOGLOBIN A1C
Hgb A1c MFr Bld: 5.5 % (ref 4.8–5.6)
Mean Plasma Glucose: 111 mg/dL

## 2022-09-20 NOTE — Anesthesia Preprocedure Evaluation (Addendum)
Anesthesia Evaluation  Patient identified by MRN, date of birth, ID band Patient awake    Reviewed: Allergy & Precautions, NPO status , Patient's Chart, lab work & pertinent test results, reviewed documented beta blocker date and time   Airway Mallampati: II  TM Distance: >3 FB Neck ROM: Full    Dental  (+) Chipped, Dental Advisory Given,    Pulmonary neg pulmonary ROS   Pulmonary exam normal breath sounds clear to auscultation       Cardiovascular hypertension (156/72 in preop, per pt normally 130s SBP), Pt. on medications and Pt. 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 There was mild    concentric hypertrophy. Systolic function was normal. The    estimated ejection fraction was in the range of 55% to 60%. Wall    motion was normal; there were no regional wall motion    abnormalities.  - Aortic valve: Transvalvular velocity was within the normal range.    There was no stenosis. There was no regurgitation.  - Mitral valve: Transvalvular velocity was within the normal range.    There was no evidence for stenosis. There was mild regurgitation.  - Left atrium: The atrium was severely dilated.  - Right ventricle: The cavity size was normal. Wall thickness was    normal. Systolic function was normal.  - Right atrium: The atrium was moderately dilated.  - Tricuspid valve: There was mild regurgitation.  - Pulmonary arteries: Systolic pressure was mildly increased. PA    peak pressure: 48 mm Hg (S).     Neuro/Psych  Headaches PSYCHIATRIC DISORDERS Anxiety Depression       GI/Hepatic Neg liver ROS, hiatal hernia,GERD  Controlled,,  Endo/Other  negative endocrine ROS    Renal/GU negative  Renal ROS  negative genitourinary   Musculoskeletal  (+) Arthritis , Osteoarthritis,    Abdominal   Peds  Hematology negative hematology ROS (+) Hb 12.5, plt 215   Anesthesia Other Findings   Reproductive/Obstetrics negative OB ROS                              Anesthesia Physical Anesthesia Plan  ASA: 3  Anesthesia Plan: Spinal, Regional and MAC   Post-op Pain Management: Regional block* and Tylenol PO (pre-op)*   Induction:   PONV Risk Score and Plan: 2 and Propofol infusion and TIVA  Airway Management Planned: Natural Airway and Nasal Cannula  Additional Equipment: None  Intra-op Plan:   Post-operative Plan:   Informed Consent: I have reviewed the patients History and Physical, chart, labs and discussed the procedure including the risks, benefits and alternatives for the proposed anesthesia with the patient or authorized representative who has indicated his/her understanding and acceptance.       Plan Discussed with: CRNA  Anesthesia Plan Comments:         Anesthesia Quick Evaluation

## 2022-09-20 NOTE — H&P (Signed)
TOTAL KNEE ADMISSION H&P  Patient is being admitted for right total knee arthroplasty.  Subjective:  Chief Complaint:right knee pain.  HPI: Sierra [name], 77 y.o. female, has a history of pain and functional disability in the right knee due to arthritis and has failed non-surgical conservative treatments for greater than 12 weeks to includeNSAID's and/or analgesics, corticosteriod injections, viscosupplementation injections, flexibility and strengthening excercises, supervised PT with diminished ADL's post treatment, use of assistive devices, weight reduction as appropriate, and activity modification.  Onset of symptoms was gradual, starting 5 years ago with gradually worsening course since that time. The patient noted no past surgery on the right knee(s).  Patient currently rates pain in the right knee(s) at 10 out of 10 with activity. Patient has night pain, worsening of pain with activity and weight bearing, pain that interferes with activities of daily living, crepitus, and joint swelling.  Patient has evidence of subchondral cysts, subchondral sclerosis, periarticular osteophytes, and joint space narrowing by imaging studies. There is no active infection.  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   Intractable pain 09/07/2021   Weakness 09/06/2021   Back pain 09/06/2021   Low back pain 09/06/2021   Shingles    Persistent atrial fibrillation    Chronic diastolic heart failure 0000000   Bilateral leg edema 05/10/2017   Encounter for therapeutic drug monitoring 04/18/2017   Bacteremia due to Klebsiella pneumoniae 04/08/2017   Sepsis [id number]   Complicated urinary tract infection 04/07/2017   Depression 04/07/2017   HLD (hyperlipidemia) 04/07/2017   Arthritis    Essential hypertension 07/29/2015   PAC (premature atrial contraction) 07/29/2015   PAF (paroxysmal atrial fibrillation) 06/11/2015   Left hip pain  09/19/2013   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   Past Medical History:  Diagnosis Date   Anxiety    Arthritis    "joints" (09/19/2013)   Atrial fibrillation (HCC)    Basal cell carcinoma of cheek    left   CHF (congestive heart failure) (HCC)    Chronic lower back pain    Dysrhythmia    GERD (gastroesophageal reflux disease)    H/O hiatal hernia    Hypertension    Migraine    "haven't had any in a long time" (09/19/2013)   Pneumonia ~ 2012    Past Surgical History:  Procedure Laterality Date   BASAL CELL CARCINOMA EXCISION Left    "cheek"   CARDIOVERSION N/A 07/15/2017   Procedure: CARDIOVERSION;  Surgeon: Thayer Headings, MD;  Location: [place];  Service: Cardiovascular;  Laterality: N/A;   CHOLECYSTECTOMY  02/19/1989   COLONOSCOPY WITH PROPOFOL N/A 07/05/2016   Procedure: COLONOSCOPY WITH PROPOFOL;  Surgeon: Garlan Fair, MD;  Location: [facility];  Service: Endoscopy;  Laterality: N/A;   DILATION AND CURETTAGE OF UTERUS     HERNIA REPAIR  [id number]   umbilical   JOINT REPLACEMENT     MOHS SURGERY     OOPHORECTOMY Bilateral    REVERSE SHOULDER ARTHROPLASTY Left 03/11/2022   Procedure: REVERSE SHOULDER ARTHROPLASTY;  Surgeon: Tania Ade, MD;  Location: [facility];  Service: Orthopedics;  Laterality: Left;   REVISION TOTAL HIP ARTHROPLASTY Left 09/19/2013   SHOULDER ARTHROSCOPY W/ ROTATOR CUFF REPAIR Right    TEMPOROMANDIBULAR JOINT SURGERY     TMJ ARTHROPLASTY     TOTAL HIP ARTHROPLASTY Left 06/22/2007   TOTAL HIP ARTHROPLASTY Right 06/21/2009   TOTAL HIP REVISION Left 09/19/2013  Procedure: LEFT TOTAL HIP REVISION;  Surgeon: Kerin Salen, MD;  Location: [place];  Service: Orthopedics;  Laterality: Left;   TUBAL LIGATION     VAGINAL HYSTERECTOMY  02/19/1989    No current facility-administered medications for this encounter.   Current Outpatient Medications  Medication Sig Dispense Refill Last Dose   amoxicillin (AMOXIL) 500 MG capsule Take 2,000 mg  by mouth as directed. Dental procedures only      Cholecalciferol (VITAMIN D-3) 5000 UNITS TABS Take 5,000 Units by mouth every Monday, Wednesday, and Friday.       citalopram (CELEXA) 20 MG tablet Take 20 mg by mouth daily.      Coenzyme Q10 (CO Q-10) 100 MG CAPS Take 100 mg by mouth daily.      colestipol (COLESTID) 5 g packet Take 5 g by mouth 2 (two) times daily.   3    fexofenadine (ALLEGRA) 180 MG tablet Take 180 mg by mouth at bedtime.      Magnesium 250 MG TABS Take 250 mg by mouth every Monday, Wednesday, and Friday.      Menthol, Topical Analgesic, (BIOFREEZE) 4 % GEL Apply 1 application. topically daily as needed (pain).      metoprolol succinate (TOPROL-XL) 50 MG 24 hr tablet Take 2 tablets (100 mg total) by mouth 2 (two) times daily. 360 tablet 3    Multiple Vitamins-Minerals (MEMORY VITE PO) Take 3 tablets by mouth daily. Memory plus      Multiple Vitamins-Minerals (MULTIVITAMIN WITH MINERALS) tablet Take 1 tablet by mouth daily. 50+ for her      Multiple Vitamins-Minerals (PRESERVISION AREDS 2) CAPS Take 1 capsule by mouth in the morning and at bedtime.      potassium chloride (KLOR-CON) 10 MEQ tablet Take 10 mEq by mouth daily.      RABEprazole (ACIPHEX) 20 MG tablet Take 20 mg by mouth daily.       torsemide (DEMADEX) 20 MG tablet Take 20 mg by mouth daily.      valsartan (DIOVAN) 160 MG tablet Take 160 mg by mouth daily.      warfarin (COUMADIN) 5 MG tablet TAKE AS DIRECTED BY COUMADIN CLINIC (Patient taking differently: Take 5 mg by mouth See admin instructions. 5 mg (5 mg x 1) every Mon, Fri; 2.5 mg (5 mg x 0.5) all other days) 80 tablet 1    Allergies  Allergen Reactions   Ace Inhibitors Cough   Codeine Nausea Only     GI Intolerance   Erythromycin Other (See Comments)    Stomach pain    Iohexol Hives     Code: HIVES, Desc: patient states develops hives w/ iv contrast/mms    Iodinated Contrast Media Other (See Comments)    Hives    Social History   Tobacco Use    Smoking status: Never   Smokeless tobacco: Never  Substance Use Topics   Alcohol use: Yes    Comment: rare    Family History  Problem Relation Age of Onset   Heart attack Mother    Heart attack Maternal Aunt    Heart attack Paternal Uncle      Review of Systems  Musculoskeletal:  Positive for arthralgias.       Right knee  All other systems reviewed and are negative.   Objective:  Physical Exam Constitutional:      Appearance: Normal appearance.  HENT:     Head: Normocephalic and atraumatic.     Nose: Nose normal.  Mouth/Throat:     Pharynx: Oropharynx is clear.  Eyes:     Extraocular Movements: Extraocular movements intact.  Pulmonary:     Effort: Pulmonary effort is normal.  Abdominal:     Palpations: Abdomen is soft.  Musculoskeletal:     Cervical back: Normal range of motion.     Comments: Right knee motion is about 5-100 and she has a mild valgus deformity.  There is crepitation and especially lateral joint line pain.  Hip motion is good and straight leg raise is negative.  Sensation and motor function are intact in her feet with palpable pulses on both sides.   Skin:    General: Skin is warm and dry.  Neurological:     General: No focal deficit present.     Mental Status: She is alert and oriented to person, place, and time.  Psychiatric:        Mood and Affect: Mood normal.        Behavior: Behavior normal.        Thought Content: Thought content normal.        Judgment: Judgment normal.     Vital signs in last 24 hours:    Labs:   Estimated body mass index is 29.5 kg/m as calculated from the following:   Height as of 09/16/22: 5' 5.5" (1.664 m).   Weight as of 09/16/22: 81.6 kg.   Imaging Review Plain radiographs demonstrate severe degenerative joint disease of the right knee(s). The overall alignment isneutral. The bone quality appears to be good for age and reported activity level.      Assessment/Plan:  End stage primary  arthritis, right knee   The patient history, physical examination, clinical judgment of the provider and imaging studies are consistent with end stage degenerative joint disease of the right knee(s) and total knee arthroplasty is deemed medically necessary. The treatment options including medical management, injection therapy arthroscopy and arthroplasty were discussed at length. The risks and benefits of total knee arthroplasty were presented and reviewed. The risks due to aseptic loosening, infection, stiffness, patella tracking problems, thromboembolic complications and other imponderables were discussed. The patient acknowledged the explanation, agreed to proceed with the plan and consent was signed. Patient is being admitted for inpatient treatment for surgery, pain control, PT, OT, prophylactic antibiotics, VTE prophylaxis, progressive ambulation and ADL's and discharge planning. The patient is planning to be discharged home with home health services   Patient's anticipated LOS is less than 2 midnights, meeting these requirements: - Younger than 5 - Lives within 1 hour of care - Has a competent adult at home to recover with post-op recover - NO history of  - Chronic pain requiring opiods  - Diabetes  - Coronary Artery Disease  - Heart failure  - Heart attack  - Stroke  - DVT/VTE  - Cardiac arrhythmia  - Respiratory Failure/COPD  - Renal failure  - Anemia  - Advanced Liver disease

## 2022-09-20 NOTE — Progress Notes (Signed)
Anesthesia Chart Review   Case: Sierra [name] Date/Time: 09/21/22 0932   Procedure: RIGHT TOTAL KNEE ARTHROPLASTY (Right: Knee)   Anesthesia type: Spinal   Pre-op diagnosis: RIGHT KNEE DEGENERATIVE JOINT DISEASE   Location: [facility] 06 / [facility]   Surgeons: Melrose Nakayama, MD       DISCUSSION:77 y.o. never smoker with h/o HTN, GERD, atrial fibrillation (cardioversion on Jan 25, [id number]), diastolic heart failure, right knee djd scheduled for above procedure 09/21/22 with Dr. Melrose Nakayama.   Pt last seen by cardio 08/25/2022. Stable at this visit, a-fib rate controlled, pt euvolemic. Per Dr. Irish Lack, "No further cardiac testing needed before knee replacement and 2024."  Pt reports last dose of Warfarin 09/15/2022. Advised to hold 5 days with no bridge required.   Anticipate pt can proceed with planned procedure barring acute status change.   VS: BP (!) 154/78   Pulse 92   Temp 36.9 C (Oral)   Resp 14   Ht 5' 5.5" (1.664 m)   Wt 81.6 kg   SpO2 96%   BMI 29.50 kg/m   PROVIDERS: Kathalene Frames, MD is PCP   Jettie Booze, MD is Cardiologist  LABS: Labs reviewed: Acceptable for surgery. (all labs ordered are listed, but only abnormal results are displayed)  Labs Reviewed  BASIC METABOLIC PANEL - Abnormal; Notable for the following components:      Result Value   Glucose, Bld 108 (*)    BUN 36 (*)    GFR, Estimated 60 (*)    All other components within normal limits  SURGICAL PCR SCREEN  CBC  HEMOGLOBIN A1C     IMAGES:   EKG:   CV: Echo 04/09/2017 - Left ventricle: The cavity size was normal. There was mild    concentric hypertrophy. Systolic function was normal. The    estimated ejection fraction was in the range of 55% to 60%. Wall    motion was normal; there were no regional wall motion    abnormalities.  - Aortic valve: Transvalvular velocity was within the normal range.    There was no stenosis. There was no regurgitation.  - Mitral valve:  Transvalvular velocity was within the normal range.    There was no evidence for stenosis. There was mild regurgitation.  - Left atrium: The atrium was severely dilated.  - Right ventricle: The cavity size was normal. Wall thickness was    normal. Systolic function was normal.  - Right atrium: The atrium was moderately dilated.  - Tricuspid valve: There was mild regurgitation.  - Pulmonary arteries: Systolic pressure was mildly increased. PA    peak pressure: 48 mm Hg (S).  Past Medical History:  Diagnosis Date   Anxiety    Arthritis    "joints" (09/19/2013)   Atrial fibrillation (HCC)    Basal cell carcinoma of cheek    left   CHF (congestive heart failure) (HCC)    Chronic lower back pain    Dysrhythmia    GERD (gastroesophageal reflux disease)    H/O hiatal hernia    Hypertension    Migraine    "haven't had any in a long time" (09/19/2013)   Pneumonia ~ 2012    Past Surgical History:  Procedure Laterality Date   BASAL CELL CARCINOMA EXCISION Left    "cheek"   CARDIOVERSION N/A 07/15/2017   Procedure: CARDIOVERSION;  Surgeon: Thayer Headings, MD;  Location: [facility];  Service: Cardiovascular;  Laterality: N/A;   CHOLECYSTECTOMY  02/19/1989   COLONOSCOPY  WITH PROPOFOL N/A 07/05/2016   Procedure: COLONOSCOPY WITH PROPOFOL;  Surgeon: Garlan Fair, MD;  Location: [facility];  Service: Endoscopy;  Laterality: N/A;   DILATION AND CURETTAGE OF UTERUS     HERNIA REPAIR  [id number]   umbilical   JOINT REPLACEMENT     MOHS SURGERY     OOPHORECTOMY Bilateral    REVERSE SHOULDER ARTHROPLASTY Left 03/11/2022   Procedure: REVERSE SHOULDER ARTHROPLASTY;  Surgeon: Tania Ade, MD;  Location: [facility];  Service: Orthopedics;  Laterality: Left;   REVISION TOTAL HIP ARTHROPLASTY Left 09/19/2013   SHOULDER ARTHROSCOPY W/ ROTATOR CUFF REPAIR Right    TEMPOROMANDIBULAR JOINT SURGERY     TMJ ARTHROPLASTY     TOTAL HIP ARTHROPLASTY Left 06/22/2007   TOTAL HIP ARTHROPLASTY Right  06/21/2009   TOTAL HIP REVISION Left 09/19/2013   Procedure: LEFT TOTAL HIP REVISION;  Surgeon: Kerin Salen, MD;  Location: [place];  Service: Orthopedics;  Laterality: Left;   TUBAL LIGATION     VAGINAL HYSTERECTOMY  02/19/1989    MEDICATIONS:  amoxicillin (AMOXIL) 500 MG capsule   Cholecalciferol (VITAMIN D-3) 5000 UNITS TABS   citalopram (CELEXA) 20 MG tablet   Coenzyme Q10 (CO Q-10) 100 MG CAPS   colestipol (COLESTID) 5 g packet   fexofenadine (ALLEGRA) 180 MG tablet   Magnesium 250 MG TABS   Menthol, Topical Analgesic, (BIOFREEZE) 4 % GEL   metoprolol succinate (TOPROL-XL) 50 MG 24 hr tablet   Multiple Vitamins-Minerals (MEMORY VITE PO)   Multiple Vitamins-Minerals (MULTIVITAMIN WITH MINERALS) tablet   Multiple Vitamins-Minerals (PRESERVISION AREDS 2) CAPS   potassium chloride (KLOR-CON) 10 MEQ tablet   RABEprazole (ACIPHEX) 20 MG tablet   torsemide (DEMADEX) 20 MG tablet   valsartan (DIOVAN) 160 MG tablet   warfarin (COUMADIN) 5 MG tablet   No current facility-administered medications for this encounter.    Konrad Felix Ward, PA-C [facility] [phone]

## 2022-09-21 ENCOUNTER — Ambulatory Visit (HOSPITAL_COMMUNITY): Payer: Medicare Other | Admitting: Physician Assistant

## 2022-09-21 ENCOUNTER — Other Ambulatory Visit: Payer: Self-pay

## 2022-09-21 ENCOUNTER — Ambulatory Visit (HOSPITAL_BASED_OUTPATIENT_CLINIC_OR_DEPARTMENT_OTHER): Payer: Medicare Other | Admitting: Anesthesiology

## 2022-09-21 ENCOUNTER — Observation Stay (HOSPITAL_COMMUNITY)
Admission: RE | Admit: 2022-09-21 | Discharge: 2022-09-22 | Disposition: A | Discharge status: Home / Self Care | Payer: Medicare Other | Attending: Orthopaedic Surgery | Admitting: Orthopaedic Surgery

## 2022-09-21 ENCOUNTER — Encounter (HOSPITAL_COMMUNITY)
Admission: RE | Disposition: A | Discharge status: Home / Self Care | Payer: Self-pay | Source: Home / Self Care | Attending: Orthopaedic Surgery

## 2022-09-21 ENCOUNTER — Encounter (HOSPITAL_COMMUNITY): Payer: Self-pay | Admitting: Orthopaedic Surgery

## 2022-09-21 DIAGNOSIS — Z96643 Presence of artificial hip joint, bilateral: Secondary | ICD-10-CM | POA: Insufficient documentation

## 2022-09-21 DIAGNOSIS — Z79899 Other long term (current) drug therapy: Secondary | ICD-10-CM | POA: Diagnosis not present

## 2022-09-21 DIAGNOSIS — M1711 Unilateral primary osteoarthritis, right knee: Principal | ICD-10-CM | POA: Diagnosis present

## 2022-09-21 DIAGNOSIS — F418 Other specified anxiety disorders: Secondary | ICD-10-CM | POA: Diagnosis not present

## 2022-09-21 DIAGNOSIS — Z85828 Personal history of other malignant neoplasm of skin: Secondary | ICD-10-CM | POA: Diagnosis not present

## 2022-09-21 DIAGNOSIS — I272 Pulmonary hypertension, unspecified: Secondary | ICD-10-CM | POA: Diagnosis not present

## 2022-09-21 DIAGNOSIS — Z7901 Long term (current) use of anticoagulants: Secondary | ICD-10-CM | POA: Insufficient documentation

## 2022-09-21 DIAGNOSIS — I110 Hypertensive heart disease with heart failure: Secondary | ICD-10-CM | POA: Diagnosis not present

## 2022-09-21 DIAGNOSIS — I509 Heart failure, unspecified: Secondary | ICD-10-CM | POA: Insufficient documentation

## 2022-09-21 DIAGNOSIS — I10 Essential (primary) hypertension: Secondary | ICD-10-CM

## 2022-09-21 DIAGNOSIS — I4819 Other persistent atrial fibrillation: Secondary | ICD-10-CM | POA: Diagnosis not present

## 2022-09-21 DIAGNOSIS — G8918 Other acute postprocedural pain: Secondary | ICD-10-CM | POA: Diagnosis not present

## 2022-09-21 DIAGNOSIS — Z96612 Presence of left artificial shoulder joint: Secondary | ICD-10-CM | POA: Insufficient documentation

## 2022-09-21 DIAGNOSIS — I4891 Unspecified atrial fibrillation: Secondary | ICD-10-CM | POA: Diagnosis not present

## 2022-09-21 DIAGNOSIS — Z96651 Presence of right artificial knee joint: Secondary | ICD-10-CM | POA: Diagnosis not present

## 2022-09-21 HISTORY — PX: TOTAL KNEE ARTHROPLASTY: SHX125

## 2022-09-21 SURGERY — ARTHROPLASTY, KNEE, TOTAL
Anesthesia: Monitor Anesthesia Care | Site: Knee | Laterality: Right

## 2022-09-21 MED ORDER — ONDANSETRON HCL 4 MG/2ML IJ SOLN: 4.0000 mg | Freq: Four times a day (QID) | INTRAMUSCULAR | Status: DC | PRN

## 2022-09-21 MED ORDER — TORSEMIDE 20 MG PO TABS
20.0000 mg | ORAL_TABLET | Freq: Every day | ORAL | Status: DC
  Filled 2022-09-21: qty 1

## 2022-09-21 MED ORDER — BISACODYL 5 MG PO TBEC: 5.0000 mg | DELAYED_RELEASE_TABLET | Freq: Every day | ORAL | Status: DC | PRN

## 2022-09-21 MED ORDER — OXYCODONE HCL 5 MG PO TABS
5.0000 mg | ORAL_TABLET | Freq: Once | ORAL | Status: AC | PRN
  Administered 2022-09-21: 5 mg via ORAL

## 2022-09-21 MED ORDER — BUPIVACAINE-EPINEPHRINE 0.5% -1:200000 IJ SOLN
INTRAMUSCULAR | Status: DC | PRN
  Administered 2022-09-21: 30 mL

## 2022-09-21 MED ORDER — EPINEPHRINE PF 1 MG/ML IJ SOLN
INTRAMUSCULAR | Status: AC
  Filled 2022-09-21: qty 1

## 2022-09-21 MED ORDER — CHLORHEXIDINE GLUCONATE 0.12 % MT SOLN
15.0000 mL | Freq: Once | OROMUCOSAL | Status: AC
  Administered 2022-09-21: 15 mL via OROMUCOSAL

## 2022-09-21 MED ORDER — DEXAMETHASONE SODIUM PHOSPHATE 10 MG/ML IJ SOLN
INTRAMUSCULAR | Status: DC | PRN
  Administered 2022-09-21: 10 mg

## 2022-09-21 MED ORDER — FENTANYL CITRATE PF 50 MCG/ML IJ SOSY
50.0000 ug | PREFILLED_SYRINGE | INTRAMUSCULAR | Status: DC
  Administered 2022-09-21: 50 ug via INTRAVENOUS
  Filled 2022-09-21: qty 2

## 2022-09-21 MED ORDER — SODIUM CHLORIDE 0.9 % IR SOLN
Status: DC | PRN
  Administered 2022-09-21: 3000 mL

## 2022-09-21 MED ORDER — ONDANSETRON HCL 4 MG/2ML IJ SOLN
INTRAMUSCULAR | Status: DC | PRN
  Administered 2022-09-21: 4 mg via INTRAVENOUS

## 2022-09-21 MED ORDER — METOPROLOL SUCCINATE ER 50 MG PO TB24
100.0000 mg | ORAL_TABLET | Freq: Two times a day (BID) | ORAL | Status: DC
  Administered 2022-09-21 – 2022-09-22 (×2): 100 mg via ORAL
  Filled 2022-09-21 (×2): qty 2

## 2022-09-21 MED ORDER — OXYCODONE HCL 5 MG/5ML PO SOLN: 5.0000 mg | Freq: Once | ORAL | Status: AC | PRN

## 2022-09-21 MED ORDER — HYDROMORPHONE HCL 1 MG/ML IJ SOLN: 0.2500 mg | INTRAMUSCULAR | Status: DC | PRN

## 2022-09-21 MED ORDER — CEFAZOLIN SODIUM-DEXTROSE 2-4 GM/100ML-% IV SOLN
2.0000 g | INTRAVENOUS | Status: AC
  Administered 2022-09-21: 2 g via INTRAVENOUS
  Filled 2022-09-21: qty 100

## 2022-09-21 MED ORDER — METHOCARBAMOL 500 MG IVPB - SIMPLE MED
500.0000 mg | Freq: Four times a day (QID) | INTRAVENOUS | Status: DC | PRN
  Administered 2022-09-21: 500 mg via INTRAVENOUS

## 2022-09-21 MED ORDER — METHOCARBAMOL 500 MG IVPB - SIMPLE MED
INTRAVENOUS | Status: AC
  Filled 2022-09-21: qty 55

## 2022-09-21 MED ORDER — ORAL CARE MOUTH RINSE: 15.0000 mL | Freq: Once | OROMUCOSAL | Status: AC

## 2022-09-21 MED ORDER — BUPIVACAINE IN DEXTROSE 0.75-8.25 % IT SOLN
INTRATHECAL | Status: DC | PRN
  Administered 2022-09-21: 1.6 mL via INTRATHECAL

## 2022-09-21 MED ORDER — ONDANSETRON HCL 4 MG PO TABS: 4.0000 mg | ORAL_TABLET | Freq: Four times a day (QID) | ORAL | Status: DC | PRN

## 2022-09-21 MED ORDER — WARFARIN - PHARMACIST DOSING INPATIENT: Freq: Every day | Status: DC

## 2022-09-21 MED ORDER — BUPIVACAINE HCL (PF) 0.5 % IJ SOLN
INTRAMUSCULAR | Status: AC
  Filled 2022-09-21: qty 30

## 2022-09-21 MED ORDER — BUPIVACAINE LIPOSOME 1.3 % IJ SUSP
INTRAMUSCULAR | Status: DC | PRN
  Administered 2022-09-21: 20 mL

## 2022-09-21 MED ORDER — SODIUM CHLORIDE (PF) 0.9 % IJ SOLN
INTRAMUSCULAR | Status: DC | PRN
  Administered 2022-09-21: 30 mL

## 2022-09-21 MED ORDER — CEFAZOLIN SODIUM-DEXTROSE 2-4 GM/100ML-% IV SOLN
2.0000 g | Freq: Four times a day (QID) | INTRAVENOUS | Status: AC
  Administered 2022-09-21 (×2): 2 g via INTRAVENOUS
  Filled 2022-09-21 (×2): qty 100

## 2022-09-21 MED ORDER — POTASSIUM CHLORIDE ER 10 MEQ PO TBCR
10.0000 meq | EXTENDED_RELEASE_TABLET | Freq: Every day | ORAL | Status: DC
  Filled 2022-09-21 (×2): qty 1

## 2022-09-21 MED ORDER — BUPIVACAINE LIPOSOME 1.3 % IJ SUSP: 20.0000 mL | Freq: Once | INTRAMUSCULAR | Status: DC

## 2022-09-21 MED ORDER — BUPIVACAINE LIPOSOME 1.3 % IJ SUSP
INTRAMUSCULAR | Status: AC
  Filled 2022-09-21: qty 20

## 2022-09-21 MED ORDER — MORPHINE SULFATE (PF) 2 MG/ML IV SOLN: 0.5000 mg | INTRAVENOUS | Status: DC | PRN

## 2022-09-21 MED ORDER — COLESTIPOL HCL 5 G PO PACK
5.0000 g | PACK | Freq: Two times a day (BID) | ORAL | Status: DC
  Administered 2022-09-21 – 2022-09-22 (×2): 5 g via ORAL
  Filled 2022-09-21 (×3): qty 1

## 2022-09-21 MED ORDER — IRBESARTAN 150 MG PO TABS
150.0000 mg | ORAL_TABLET | Freq: Every day | ORAL | Status: DC
  Administered 2022-09-21: 150 mg via ORAL
  Filled 2022-09-21 (×2): qty 1

## 2022-09-21 MED ORDER — TRANEXAMIC ACID-NACL 1000-0.7 MG/100ML-% IV SOLN
1000.0000 mg | INTRAVENOUS | Status: AC
  Administered 2022-09-21: 1000 mg via INTRAVENOUS
  Filled 2022-09-21: qty 100

## 2022-09-21 MED ORDER — METOCLOPRAMIDE HCL 5 MG/ML IJ SOLN: 5.0000 mg | Freq: Three times a day (TID) | INTRAMUSCULAR | Status: DC | PRN

## 2022-09-21 MED ORDER — 0.9 % SODIUM CHLORIDE (POUR BTL) OPTIME
TOPICAL | Status: DC | PRN
  Administered 2022-09-21: 1000 mL

## 2022-09-21 MED ORDER — POVIDONE-IODINE 10 % EX SWAB
2.0000 | Freq: Once | CUTANEOUS | Status: AC
  Administered 2022-09-21: 2 via TOPICAL

## 2022-09-21 MED ORDER — TRANEXAMIC ACID 1000 MG/10ML IV SOLN
2000.0000 mg | INTRAVENOUS | Status: DC
  Filled 2022-09-21: qty 20

## 2022-09-21 MED ORDER — ACETAMINOPHEN 500 MG PO TABS
500.0000 mg | ORAL_TABLET | Freq: Four times a day (QID) | ORAL | Status: DC
  Administered 2022-09-22: 500 mg via ORAL
  Filled 2022-09-21 (×2): qty 1

## 2022-09-21 MED ORDER — ONDANSETRON HCL 4 MG/2ML IJ SOLN: 4.0000 mg | Freq: Once | INTRAMUSCULAR | Status: DC | PRN

## 2022-09-21 MED ORDER — METHOCARBAMOL 500 MG PO TABS
500.0000 mg | ORAL_TABLET | Freq: Four times a day (QID) | ORAL | Status: DC | PRN
  Administered 2022-09-22: 500 mg via ORAL
  Filled 2022-09-21: qty 1

## 2022-09-21 MED ORDER — PROPOFOL 10 MG/ML IV BOLUS
INTRAVENOUS | Status: DC | PRN
  Administered 2022-09-21: 30 mg via INTRAVENOUS

## 2022-09-21 MED ORDER — HYDROCODONE-ACETAMINOPHEN 5-325 MG PO TABS
1.0000 | ORAL_TABLET | ORAL | Status: DC | PRN
  Administered 2022-09-21 – 2022-09-22 (×4): 2 via ORAL
  Filled 2022-09-21 (×5): qty 2

## 2022-09-21 MED ORDER — ACETAMINOPHEN 500 MG PO TABS
1000.0000 mg | ORAL_TABLET | Freq: Once | ORAL | Status: AC
  Administered 2022-09-21: 1000 mg via ORAL
  Filled 2022-09-21: qty 2

## 2022-09-21 MED ORDER — METOCLOPRAMIDE HCL 5 MG PO TABS: 5.0000 mg | ORAL_TABLET | Freq: Three times a day (TID) | ORAL | Status: DC | PRN

## 2022-09-21 MED ORDER — MENTHOL 3 MG MT LOZG: 1.0000 | LOZENGE | OROMUCOSAL | Status: DC | PRN

## 2022-09-21 MED ORDER — TRANEXAMIC ACID 1000 MG/10ML IV SOLN
INTRAVENOUS | Status: DC | PRN
  Administered 2022-09-21: 2000 mg via TOPICAL

## 2022-09-21 MED ORDER — DIPHENHYDRAMINE HCL 12.5 MG/5ML PO ELIX: 12.5000 mg | ORAL_SOLUTION | ORAL | Status: DC | PRN

## 2022-09-21 MED ORDER — KETOROLAC TROMETHAMINE 15 MG/ML IJ SOLN
7.5000 mg | Freq: Four times a day (QID) | INTRAMUSCULAR | Status: AC
  Administered 2022-09-21 – 2022-09-22 (×2): 7.5 mg via INTRAVENOUS
  Filled 2022-09-21 (×3): qty 1

## 2022-09-21 MED ORDER — LACTATED RINGERS IV SOLN: INTRAVENOUS | Status: DC

## 2022-09-21 MED ORDER — DEXAMETHASONE SODIUM PHOSPHATE 10 MG/ML IJ SOLN
INTRAMUSCULAR | Status: DC | PRN
  Administered 2022-09-21: 8 mg via INTRAVENOUS

## 2022-09-21 MED ORDER — ROPIVACAINE HCL 5 MG/ML IJ SOLN
INTRAMUSCULAR | Status: DC | PRN
  Administered 2022-09-21: 30 mL via PERINEURAL

## 2022-09-21 MED ORDER — PANTOPRAZOLE SODIUM 40 MG PO TBEC
40.0000 mg | DELAYED_RELEASE_TABLET | Freq: Every day | ORAL | Status: DC
  Filled 2022-09-21: qty 1

## 2022-09-21 MED ORDER — CITALOPRAM HYDROBROMIDE 20 MG PO TABS
20.0000 mg | ORAL_TABLET | Freq: Every day | ORAL | Status: DC
  Administered 2022-09-22: 20 mg via ORAL
  Filled 2022-09-21: qty 1

## 2022-09-21 MED ORDER — PROPOFOL 500 MG/50ML IV EMUL
INTRAVENOUS | Status: DC | PRN
  Administered 2022-09-21: 20 ug/kg/min via INTRAVENOUS
  Administered 2022-09-21: 75 ug/kg/min via INTRAVENOUS

## 2022-09-21 MED ORDER — WARFARIN SODIUM 6 MG PO TABS
6.0000 mg | ORAL_TABLET | Freq: Once | ORAL | Status: AC
  Administered 2022-09-21: 6 mg via ORAL
  Filled 2022-09-21: qty 1

## 2022-09-21 MED ORDER — MIDAZOLAM HCL 2 MG/2ML IJ SOLN
1.0000 mg | INTRAMUSCULAR | Status: DC
  Administered 2022-09-21: 2 mg via INTRAVENOUS
  Filled 2022-09-21: qty 2

## 2022-09-21 MED ORDER — PHENOL 1.4 % MT LIQD: 1.0000 | OROMUCOSAL | Status: DC | PRN

## 2022-09-21 MED ORDER — LORATADINE 10 MG PO TABS
10.0000 mg | ORAL_TABLET | Freq: Every day | ORAL | Status: DC
  Filled 2022-09-21: qty 1

## 2022-09-21 MED ORDER — SODIUM CHLORIDE (PF) 0.9 % IJ SOLN
INTRAMUSCULAR | Status: AC
  Filled 2022-09-21: qty 30

## 2022-09-21 MED ORDER — ACETAMINOPHEN 325 MG PO TABS
325.0000 mg | ORAL_TABLET | Freq: Four times a day (QID) | ORAL | Status: DC | PRN
  Filled 2022-09-21: qty 2

## 2022-09-21 MED ORDER — DOCUSATE SODIUM 100 MG PO CAPS
100.0000 mg | ORAL_CAPSULE | Freq: Two times a day (BID) | ORAL | Status: DC
  Filled 2022-09-21 (×2): qty 1

## 2022-09-21 MED ORDER — TRANEXAMIC ACID-NACL 1000-0.7 MG/100ML-% IV SOLN
1000.0000 mg | Freq: Once | INTRAVENOUS | Status: AC
  Administered 2022-09-21: 1000 mg via INTRAVENOUS
  Filled 2022-09-21: qty 100

## 2022-09-21 MED ORDER — OXYCODONE HCL 5 MG PO TABS
ORAL_TABLET | ORAL | Status: AC
  Filled 2022-09-21: qty 1

## 2022-09-21 MED ORDER — ALUM & MAG HYDROXIDE-SIMETH 200-200-20 MG/5ML PO SUSP: 30.0000 mL | ORAL | Status: DC | PRN

## 2022-09-21 MED ORDER — HYDROCODONE-ACETAMINOPHEN 7.5-325 MG PO TABS: 1.0000 | ORAL_TABLET | ORAL | Status: DC | PRN

## 2022-09-21 SURGICAL SUPPLY — 63 items
ATTUNE PS FEM RT SZ 4 CEM KNEE (Femur) IMPLANT
ATTUNE PSRP INSR SZ4 7 KNEE (Insert) IMPLANT
BAG COUNTER SPONGE SURGICOUNT (BAG) ×1 IMPLANT
BAG DECANTER FOR FLEXI CONT (MISCELLANEOUS) ×1 IMPLANT
BAG SPEC THK2 15X12 ZIP CLS (MISCELLANEOUS) ×1
BAG SPNG CNTER NS LX DISP (BAG) ×1
BAG ZIPLOCK 12X15 (MISCELLANEOUS) ×1 IMPLANT
BASEPLATE TIBIAL ROTATING SZ 4 (Knees) IMPLANT
BLADE SAGITTAL 25.0X1.19X90 (BLADE) ×1 IMPLANT
BLADE SAW SGTL 11.0X1.19X90.0M (BLADE) ×1 IMPLANT
BLADE SURG SZ10 CARB STEEL (BLADE) ×1 IMPLANT
BNDG CMPR 5X62 HK CLSR LF (GAUZE/BANDAGES/DRESSINGS) ×1
BNDG CMPR MED 10X6 ELC LF (GAUZE/BANDAGES/DRESSINGS) ×1
BNDG ELASTIC 6INX 5YD STR LF (GAUZE/BANDAGES/DRESSINGS) ×1 IMPLANT
BNDG ELASTIC 6X10 VLCR STRL LF (GAUZE/BANDAGES/DRESSINGS) IMPLANT
BOOTIES KNEE HIGH SLOAN (MISCELLANEOUS) ×1 IMPLANT
BOWL SMART MIX CTS (DISPOSABLE) ×1 IMPLANT
BSPLAT TIB 4 CMNT ROT PLAT STR (Knees) ×1 IMPLANT
CEMENT HV SMART SET (Cement) ×2 IMPLANT
COVER SURGICAL LIGHT HANDLE (MISCELLANEOUS) ×1 IMPLANT
CUFF TOURN SGL QUICK 34 (TOURNIQUET CUFF) ×1
CUFF TRNQT CYL 34X4.125X (TOURNIQUET CUFF) ×1 IMPLANT
DRAPE TOP 10253 STERILE (DRAPES) ×1 IMPLANT
DRAPE U-SHAPE 47X51 STRL (DRAPES) ×1 IMPLANT
DRESSING AQUACEL AG SP 3.5X10 (GAUZE/BANDAGES/DRESSINGS) IMPLANT
DRSG AQUACEL AG ADV 3.5X10 (GAUZE/BANDAGES/DRESSINGS) ×1 IMPLANT
DRSG AQUACEL AG SP 3.5X10 (GAUZE/BANDAGES/DRESSINGS) ×1
DURAPREP 26ML APPLICATOR (WOUND CARE) ×2 IMPLANT
ELECT REM PT RETURN 15FT ADLT (MISCELLANEOUS) ×1 IMPLANT
GLOVE BIO SURGEON STRL SZ8 (GLOVE) ×2 IMPLANT
GLOVE BIOGEL PI IND STRL 7.0 (GLOVE) ×1 IMPLANT
GLOVE BIOGEL PI IND STRL 8 (GLOVE) ×2 IMPLANT
GLOVE SURG SYN 7.0 (GLOVE) ×1 IMPLANT
GLOVE SURG SYN 7.0 PF PI (GLOVE) ×1 IMPLANT
GOWN SRG XL LVL 4 BRTHBL STRL (GOWNS) ×1 IMPLANT
GOWN STRL NON-REIN XL LVL4 (GOWNS) ×1
GOWN STRL REUS W/ TWL XL LVL3 (GOWN DISPOSABLE) ×2 IMPLANT
GOWN STRL REUS W/TWL XL LVL3 (GOWN DISPOSABLE) ×2
HANDPIECE INTERPULSE COAX TIP (DISPOSABLE) ×1
HOLDER FOLEY CATH W/STRAP (MISCELLANEOUS) IMPLANT
HOOD PEEL AWAY T7 (MISCELLANEOUS) ×3 IMPLANT
KIT TURNOVER KIT A (KITS) IMPLANT
MANIFOLD NEPTUNE II (INSTRUMENTS) ×1 IMPLANT
NDL HYPO 22X1.5 SAFETY MO (MISCELLANEOUS) ×1 IMPLANT
NEEDLE HYPO 22X1.5 SAFETY MO (MISCELLANEOUS) ×1 IMPLANT
NS IRRIG 1000ML POUR BTL (IV SOLUTION) ×1 IMPLANT
PACK TOTAL KNEE CUSTOM (KITS) ×1 IMPLANT
PAD ARMBOARD 7.5X6 YLW CONV (MISCELLANEOUS) ×1 IMPLANT
PATELLA MEDIAL ATTUN 35MM KNEE (Knees) IMPLANT
PIN STEINMAN FIXATION KNEE (PIN) IMPLANT
PROTECTOR NERVE ULNAR (MISCELLANEOUS) ×1 IMPLANT
SET HNDPC FAN SPRY TIP SCT (DISPOSABLE) ×1 IMPLANT
SPIKE FLUID TRANSFER (MISCELLANEOUS) ×2 IMPLANT
SUT ETHIBOND NAB CT1 #1 30IN (SUTURE) ×1 IMPLANT
SUT VIC AB 0 CT1 36 (SUTURE) ×1 IMPLANT
SUT VIC AB 2-0 CT1 27 (SUTURE) ×1
SUT VIC AB 2-0 CT1 TAPERPNT 27 (SUTURE) ×1 IMPLANT
SUT VICRYL AB 3-0 FS1 BRD 27IN (SUTURE) ×1 IMPLANT
SUT VLOC 180 0 24IN GS25 (SUTURE) ×1 IMPLANT
TRAY FOLEY MTR SLVR 16FR STAT (SET/KITS/TRAYS/PACK) IMPLANT
WATER STERILE IRR 1000ML POUR (IV SOLUTION) ×1 IMPLANT
WRAP KNEE MAXI GEL POST OP (GAUZE/BANDAGES/DRESSINGS) ×1 IMPLANT
YANKAUER SUCT BULB TIP NO VENT (SUCTIONS) ×1 IMPLANT

## 2022-09-21 NOTE — Interval H&P Note (Signed)
History and Physical Interval Note:  09/21/2022 9:37 AM  Sierra [name]  has presented today for surgery, with the diagnosis of RIGHT KNEE DEGENERATIVE JOINT DISEASE.  The various methods of treatment have been discussed with the patient and family. After consideration of risks, benefits and other options for treatment, the patient has consented to  Procedure(s): RIGHT TOTAL KNEE ARTHROPLASTY (Right) as a surgical intervention.  The patient's history has been reviewed, patient examined, no change in status, stable for surgery.  I have reviewed the patient's chart and labs.  Questions were answered to the patient's satisfaction.     Hessie Dibble

## 2022-09-21 NOTE — Progress Notes (Signed)
ANTICOAGULATION CONSULT NOTE - Initial Consult  Pharmacy Consult for Warfarin Indication: atrial fibrillation  Allergies  Allergen Reactions   Ace Inhibitors Cough   Codeine Nausea Only     GI Intolerance   Erythromycin Other (See Comments)    Stomach pain    Iohexol Hives     Code: HIVES, Desc: patient states develops hives w/ iv contrast/mms    Iodinated Contrast Media Other (See Comments)    Hives    Patient Measurements: Height: 5' 5.5" (166.4 cm) Weight: 81 kg (178 lb 9.2 oz) IBW/kg (Calculated) : 58.15  Vital Signs: Temp: 97.5 F (36.4 C) (04/02 1215) Temp Source: Oral (04/02 0742) BP: 137/62 (04/02 1300) Pulse Rate: 82 (04/02 1300)  Labs: No results for input(s): "HGB", "HCT", "PLT", "APTT", "LABPROT", "INR", "HEPARINUNFRC", "HEPRLOWMOCWT", "CREATININE", "CKTOTAL", "CKMB", "TROPONINIHS" in the last 72 hours.  Estimated Creatinine Clearance: 50.8 mL/min (by C-G formula based on SCr of 0.97 mg/dL).   Medical History: Past Medical History:  Diagnosis Date   Anxiety    Arthritis    "joints" (09/19/2013)   Atrial fibrillation    Basal cell carcinoma of cheek    left   CHF (congestive heart failure)    Chronic lower back pain    Dysrhythmia    GERD (gastroesophageal reflux disease)    H/O hiatal hernia    Hypertension    Migraine    "haven't had any in a long time" (09/19/2013)   Pneumonia ~ 2012    Assessment: Active Problem(s): R TKR  PMH: shoulder surgery, OA, back pain, shingles 08/2021, afib, HF, h/o bacteremia 2018, UTI, depression, HLD, HTN, L hip surgery 2015  AC/Heme: Warfarin PTA for afib. Baseline Hgb 12.5. - INR 2.7 on 3/22. Dose 5mg  MF, 2.5mg  all other days  Goal of Therapy:  INR 2-3 Monitor platelets by anticoagulation protocol: Yes   Plan:  Coumadin 6mg  po x 1 tonight Daily INR  Kayleena Eke S. Alford Highland, PharmD, BCPS Clinical Staff Pharmacist [email or web address] Alford Highland, [place] 09/21/2022,2:09 PM

## 2022-09-21 NOTE — Plan of Care (Signed)

## 2022-09-21 NOTE — Anesthesia Procedure Notes (Signed)
Anesthesia Regional Block: Adductor canal block   Pre-Anesthetic Checklist: , timeout performed,  Correct Patient, Correct Site, Correct Laterality,  Correct Procedure, Correct Position, site marked,  Risks and benefits discussed,  Surgical consent,  Pre-op evaluation,  At surgeon's request and post-op pain management  Laterality: Right  Prep: Maximum Sterile Barrier Precautions used, chloraprep       Needles:  Injection technique: Single-shot  Needle Type: Echogenic Stimulator Needle     Needle Length: 9cm  Needle Gauge: 22     Additional Needles:   Procedures:,,,, ultrasound used (permanent image in chart),,    Narrative:  Start time: 09/21/2022 8:45 AM End time: 09/21/2022 8:50 AM Injection made incrementally with aspirations every 5 mL.  Performed by: Personally  Anesthesiologist: Pervis Hocking, DO  Additional Notes: Monitors applied. No increased pain on injection. No increased resistance to injection. Injection made in 5cc increments. Good needle visualization. Patient tolerated procedure well.

## 2022-09-21 NOTE — Evaluation (Signed)
Physical Therapy Evaluation Patient Details Name: Sierra [name] MRN: [id number] DOB: 31-May-1946 Today's Date: 09/21/2022  History of Present Illness  77 yo female presents to therapy s/p R TKA on 09/21/2022 due to failure of conservative measures. Pt PMH includes but is not limited to: L reverse TSA, LBP, dCHF, HDL, A-fib, HTN, R RTC repair, B THA and L THA revision and TMJ sx.  Clinical Impression    Sierra [name] is a 77 y.o. female POD 0 s/p R TKA. Patient reports mod I with SPC with mobility at baseline. Patient is now limited by functional impairments (see PT problem list below) and requires min A for bed mobility and min A for transfers. Patient was able to ambulate 20 feet with RW and min guard level of assist. Patient instructed in exercise to facilitate ROM and circulation to manage edema. Patient will benefit from continued skilled PT interventions to address impairments and progress towards PLOF. Acute PT will follow to progress mobility and stair training in preparation for safe discharge home with family support and continued therapy services.      Recommendations for follow up therapy are one component of a multi-disciplinary discharge planning process, led by the attending physician.  Recommendations may be updated based on patient status, additional functional criteria and insurance authorization.  Follow Up Recommendations       Assistance Recommended at Discharge Frequent or constant Supervision/Assistance  Patient can return home with the following  A little help with walking and/or transfers;A little help with bathing/dressing/bathroom;Assistance with cooking/housework;Assist for transportation;Help with stairs or ramp for entrance    Equipment Recommendations None recommended by PT (pt reports DME in home setting)  Recommendations for Other Services       Functional Status Assessment Patient has had a recent decline in their functional status and demonstrates the ability to  make significant improvements in function in a reasonable and predictable amount of time.     Precautions / Restrictions Precautions Precautions: Knee;Fall Restrictions Weight Bearing Restrictions: No RLE Weight Bearing: Weight bearing as tolerated      Mobility  Bed Mobility Overal bed mobility: Needs Assistance Bed Mobility: Supine to Sit     Supine to sit: Min assist     General bed mobility comments: HOB elevated, increased time and use of bed rails. pt sleeps in lift chair at home and reprots the L knee is not owrking well either    Transfers Overall transfer level: Needs assistance Equipment used: Rolling walker (2 wheels) Transfers: Sit to/from Stand Sit to Stand: From elevated surface, Min assist           General transfer comment: cues for proper UE placement    Ambulation/Gait Ambulation/Gait assistance: Min guard Gait Distance (Feet): 20 Feet Assistive device: Rolling walker (2 wheels) Gait Pattern/deviations: Step-to pattern, Trunk flexed, Antalgic Gait velocity: significant decrease        Stairs            Wheelchair Mobility    Modified Rankin (Stroke Patients Only)       Balance Overall balance assessment: Needs assistance Sitting-balance support: Feet supported (limited B knee flexion EOB) Sitting balance-Leahy Scale: Fair     Standing balance support: Bilateral upper extremity supported, During functional activity, Reliant on assistive device for balance Standing balance-Leahy Scale: Poor                               Pertinent Vitals/Pain Pain  Assessment Pain Assessment: 0-10 Pain Score: 8  (3/10 at rest and screaming pain when PT removed CP, no apparent increase in pain 5/10 with gait and transfer tasks) Pain Location: R knee Pain Descriptors / Indicators: Aching, Constant, Operative site guarding, Discomfort Pain Intervention(s): Limited activity within patient's tolerance, Monitored during session,  Premedicated before session, Repositioned, Ice applied    Home Living Family/patient expects to be discharged to:: Private residence Living Arrangements: Spouse/significant other Available Help at Discharge: Family Type of Home: House Home Access: Stairs to enter Entrance Stairs-Rails: Chemical engineer of Steps: 4   Home Layout: One level Home Equipment: Cane - single point;Toilet riser;Shower seat;Grab bars - Statistician (2 wheels);BSC/3in1 Additional Comments: sleeping in lift chair    Prior Function Prior Level of Function : Independent/Modified Independent             Mobility Comments: SPC with gait in home and community, mod I with ADLs, self care tasks, self care tasks. Husband assists with IADLs and household management.       Hand Dominance        Extremity/Trunk Assessment        Lower Extremity Assessment Lower Extremity Assessment: RLE deficits/detail RLE Deficits / Details: ankle DF 4/5, PF 5/5; SLR > 10 degree lag RLE Sensation: WNL    Cervical / Trunk Assessment Cervical / Trunk Assessment: Normal  Communication   Communication: No difficulties  Cognition Arousal/Alertness: Awake/alert Behavior During Therapy: WFL for tasks assessed/performed Overall Cognitive Status: Within Functional Limits for tasks assessed                                          General Comments      Exercises Total Joint Exercises Ankle Circles/Pumps: AROM, Both, 15 reps   Assessment/Plan    PT Assessment Patient needs continued PT services  PT Problem List Decreased strength;Decreased range of motion;Decreased activity tolerance;Decreased balance;Decreased mobility;Decreased coordination;Pain       PT Treatment Interventions DME instruction;Gait training;Stair training;Functional mobility training;Therapeutic activities;Therapeutic exercise;Neuromuscular re-education;Balance training;Patient/family  education;Modalities    PT Goals (Current goals can be found in the Care Plan section)  Acute Rehab PT Goals Patient Stated Goal: go on vacation and sleeping in bed PT Goal Formulation: With patient/family Time For Goal Achievement: 10/05/22 Potential to Achieve Goals: Good    Frequency 7X/week     Co-evaluation               AM-PAC PT "6 Clicks" Mobility  Outcome Measure Help needed turning from your back to your side while in a flat bed without using bedrails?: A Little Help needed moving from lying on your back to sitting on the side of a flat bed without using bedrails?: A Little Help needed moving to and from a bed to a chair (including a wheelchair)?: A Little Help needed standing up from a chair using your arms (e.g., wheelchair or bedside chair)?: A Little Help needed to walk in hospital room?: A Little Help needed climbing 3-5 steps with a railing? : Total 6 Click Score: 16    End of Session Equipment Utilized During Treatment: Gait belt Activity Tolerance: Patient limited by pain;Patient limited by fatigue Patient left: in chair;with call bell/phone within reach;with chair alarm set Nurse Communication: Mobility status;Other (comment) (pain report nursing states pain medication and mm relaxer prior to PT eval.) PT Visit Diagnosis: Unsteadiness on feet (R26.81);Other  abnormalities of gait and mobility (R26.89);Muscle weakness (generalized) (M62.81);Pain;Difficulty in walking, not elsewhere classified (R26.2) Pain - Right/Left: Right Pain - part of body: Knee    Time: [id number] PT Time Calculation (min) (ACUTE ONLY): 38 min   Charges:   PT Evaluation $PT Eval Low Complexity: 1 Low PT Treatments $Gait Training: 8-22 mins $Therapeutic Activity: 8-22 mins        Baird Lyons, PT   Adair Patter 09/21/2022, 7:06 PM

## 2022-09-21 NOTE — Transfer of Care (Signed)
Immediate Anesthesia Transfer of Care Note  Patient: Sierra [name]  Procedure(s) Performed: RIGHT TOTAL KNEE ARTHROPLASTY (Right: Knee)  Patient Location: PACU  Anesthesia Type:Spinal  Level of Consciousness: awake and patient cooperative  Airway & Oxygen Therapy: Patient Spontanous Breathing and Patient connected to face mask  Post-op Assessment: Report given to RN and Post -op Vital signs reviewed and stable  Post vital signs: Reviewed and stable  Last Vitals:  Vitals Value Taken Time  BP 124/66 09/21/22 1211  Temp    Pulse 84 09/21/22 1213  Resp 20 09/21/22 1213  SpO2 96 % 09/21/22 1213  Vitals shown include unvalidated device data.  Last Pain:  Vitals:   09/21/22 0907  TempSrc:   PainSc: 0-No pain         Complications: No notable events documented.

## 2022-09-21 NOTE — Anesthesia Procedure Notes (Addendum)
Spinal  Start time: 09/21/2022 10:23 AM End time: 09/21/2022 10:26 AM Staffing Performed: resident/CRNA  Resident/CRNA: Claudia Desanctis, CRNA Performed by: Claudia Desanctis, CRNA Authorized by: Pervis Hocking, DO   Preanesthetic Checklist Completed: patient identified, IV checked, site marked, risks and benefits discussed, surgical consent, monitors and equipment checked, pre-op evaluation and timeout performed Spinal Block Patient position: sitting Prep: DuraPrep Patient monitoring: heart rate, cardiac monitor, continuous pulse ox and blood pressure Approach: midline Location: L3-4 Injection technique: single-shot Needle Needle type: Pencan  Needle gauge: 24 G Needle length: 10 cm Needle insertion depth: 8 cm Assessment Sensory level: T6 Events: CSF return Additional Notes Expiration dates checked,free flow at beginning and end of injection

## 2022-09-21 NOTE — Op Note (Signed)
PREOP DIAGNOSIS: DJD RIGHT KNEE POSTOP DIAGNOSIS: same PROCEDURE: RIGHT TKR ANESTHESIA: Spinal and MAC ATTENDING SURGEON: Hessie Dibble ASSISTANT: Sierra Dolly PA  INDICATIONS FOR PROCEDURE: Sierra [name] is a 77 y.o. female who has struggled for a long time with pain due to degenerative arthritis of the right knee.  The patient has failed many conservative non-operative measures and at this point has pain which limits the ability to sleep and walk.  The patient is offered total knee replacement.  Informed operative consent was obtained after discussion of possible risks of anesthesia, infection, neurovascular injury, DVT, and death.  The importance of the post-operative rehabilitation protocol to optimize result was stressed extensively with the patient.  SUMMARY OF FINDINGS AND PROCEDURE:  Sierra [name] was taken to the operative suite where under the above anesthesia a right knee replacement was performed.  There were advanced degenerative changes and the bone quality was poor.  We used the DePuy Attune system and placed size 4 femur, 4 tibia, 35 mm all polyethylene patella, and a size 7 mm spacer.  Sierra Dolly PA-C assisted throughout and was invaluable to the completion of the case in that he helped retract and maintain exposure while I placed components.  He also helped close thereby minimizing OR time.  The patient was admitted for appropriate post-op care to include perioperative antibiotics and mechanical and pharmacologic measures for DVT prophylaxis.  DESCRIPTION OF PROCEDURE:  Sierra [name] was taken to the operative suite where the above anesthesia was applied.  The patient was positioned supine and prepped and draped in normal sterile fashion.  An appropriate time out was performed.  After the administration of kefzol pre-op antibiotic the leg was elevated and exsanguinated and a tourniquet inflated. A standard longitudinal incision was made on the anterior knee.  Dissection was carried  down to the extensor mechanism.  All appropriate anti-infective measures were used including the pre-operative antibiotic, betadine impregnated drape, and closed hooded exhaust systems for each member of the surgical team.  A medial parapatellar incision was made in the extensor mechanism and the knee cap flipped and the knee flexed.  Some residual meniscal tissues were removed along with any remaining ACL/PCL tissue.  A guide was placed on the tibia and a flat cut was made on it's superior surface.  An intramedullary guide was placed in the femur and was utilized to make anterior and posterior cuts creating an appropriate flexion gap.  A second intramedullary guide was placed in the femur to make a distal cut properly balancing the knee with an extension gap equal to the flexion gap.  The three bones sized to the above mentioned sizes and the appropriate guides were placed and utilized.  A trial reduction was done and the knee easily came to full extension and the patella tracked well on flexion.  The trial components were removed and all bones were cleaned with pulsatile lavage and then dried thoroughly.  Cement was mixed and was pressurized onto the bones followed by placement of the aforementioned components.  Excess cement was trimmed and pressure was held on the components until the cement had hardened.  The tourniquet was deflated and a small amount of bleeding was controlled with cautery and pressure.  The knee was irrigated thoroughly.  The extensor mechanism was re-approximated with #1 ethibond in interrupted fashion.  The knee was flexed and the repair was solid.  The subcutaneous tissues were re-approximated with #0 and #2-0 vicryl and the skin closed with  a subcuticular stitch and steristrips.  A sterile dressing was applied.  Intraoperative fluids, EBL, and tourniquet time can be obtained from anesthesia records.  DISPOSITION:  The patient was taken to recovery room in stable condition and scheduled  to potentially go home same day depending on ability to walk and tolerate liquids.Sierra [name] 09/21/2022, 11:43 AM

## 2022-09-22 ENCOUNTER — Other Ambulatory Visit: Payer: Self-pay | Admitting: Interventional Cardiology

## 2022-09-22 ENCOUNTER — Other Ambulatory Visit: Payer: Self-pay | Admitting: Physician Assistant

## 2022-09-22 DIAGNOSIS — I480 Paroxysmal atrial fibrillation: Secondary | ICD-10-CM

## 2022-09-22 DIAGNOSIS — I110 Hypertensive heart disease with heart failure: Secondary | ICD-10-CM | POA: Diagnosis not present

## 2022-09-22 DIAGNOSIS — I509 Heart failure, unspecified: Secondary | ICD-10-CM | POA: Diagnosis not present

## 2022-09-22 DIAGNOSIS — M1711 Unilateral primary osteoarthritis, right knee: Secondary | ICD-10-CM | POA: Diagnosis not present

## 2022-09-22 DIAGNOSIS — Z96612 Presence of left artificial shoulder joint: Secondary | ICD-10-CM | POA: Diagnosis not present

## 2022-09-22 DIAGNOSIS — Z85828 Personal history of other malignant neoplasm of skin: Secondary | ICD-10-CM | POA: Diagnosis not present

## 2022-09-22 DIAGNOSIS — I4819 Other persistent atrial fibrillation: Secondary | ICD-10-CM | POA: Diagnosis not present

## 2022-09-22 LAB — PROTIME-INR
INR: 1.2 (ref 0.8–1.2)
Prothrombin Time: 15.1 seconds (ref 11.4–15.2)

## 2022-09-22 MED ORDER — METHOCARBAMOL 500 MG PO TABS: 500.0000 mg | ORAL_TABLET | Freq: Four times a day (QID) | ORAL | 0 refills | Status: DC | PRN

## 2022-09-22 MED ORDER — WARFARIN SODIUM 5 MG PO TABS: 5.0000 mg | ORAL_TABLET | Freq: Once | ORAL | Status: DC

## 2022-09-22 MED ORDER — HYDROCODONE-ACETAMINOPHEN 5-325 MG PO TABS: 1.0000 | ORAL_TABLET | Freq: Four times a day (QID) | ORAL | 0 refills | Status: DC | PRN

## 2022-09-22 NOTE — Progress Notes (Signed)
ANTICOAGULATION CONSULT NOTE - Follow Up Consult  Pharmacy Consult for warfarin Indication: atrial fibrillation  Allergies  Allergen Reactions   Ace Inhibitors Cough   Codeine Nausea Only     GI Intolerance   Erythromycin Other (See Comments)    Stomach pain    Iohexol Hives     Code: HIVES, Desc: patient states develops hives w/ iv contrast/mms    Iodinated Contrast Media Other (See Comments)    Hives    Patient Measurements: Height: 5' 5.5" (166.4 cm) Weight: 81 kg (178 lb 9.2 oz) IBW/kg (Calculated) : 58.15  Vital Signs: Temp: 97.8 F (36.6 C) (04/03 0607) Temp Source: Oral (04/03 0607) BP: 133/75 (04/03 0607) Pulse Rate: 87 (04/03 0607)  Labs: Recent Labs    09/22/22 0401  LABPROT 15.1  INR 1.2    Estimated Creatinine Clearance: 50.8 mL/min (by C-G formula based on SCr of 0.97 mg/dL).   Medications: Warfarin PTA for history of atrial fibrillation -Home dose: Warfarin 5 mg PO daily on Mon, Fri; 2.5 mg all other days -Last dose prior to surgery: 3/28  Assessment: Pt is a 30 yoF chronically anticoagulated with warfarin for atrial fibrillation. Pt admitted on 4/2 and underwent TKA. Warfarin resumed POD#0 - pharmacy dosing while inpatient.   Today, 09/22/22 INR = 1.2 remains subtherapeutic as expected after holding warfarin pre-op CBC 3/28 pre-op was WNL. No new labs SCDs ordered  Spoke with Loni Dolly, PA-C - inquired about the addition of enoxaparin 40 mg subQ daily for DVT ppx post-op while inpatient.   Goal of Therapy:  INR 2-3 Monitor platelets by anticoagulation protocol: Yes   Plan:  Warfarin 5 mg PO once today CBC with AM labs tomorrow. INR daily.  No enoxaparin per ortho  INR was therapeutic on home dose at [facility] clinic appointment on 09/10/22. Would recommend continuation of home dose at discharge. INR check recommended 1 week post-op per [facility] clinic note.  Lenis Noon, PharmD 09/22/2022,8:00 AM

## 2022-09-22 NOTE — Plan of Care (Signed)

## 2022-09-22 NOTE — Telephone Encounter (Signed)
Warfarin 5mg  refill Afib Last INR 09/10/22 Last OV 08/25/22 Pt being discharged from hospital.

## 2022-09-22 NOTE — Discharge Summary (Signed)
Patient ID: AYUMI DIP MRN: [id number] DOB/AGE: [birth year] 77 y.o.  Admit date: 09/21/2022 Discharge date: 09/22/2022  Admission Diagnoses:  Principal Problem:   Primary osteoarthritis of right knee Active Problems:   Primary localized osteoarthritis of right knee   Discharge Diagnoses:  Same  Past Medical History:  Diagnosis Date   Anxiety    Arthritis    "joints" (09/19/2013)   Atrial fibrillation    Basal cell carcinoma of cheek    left   CHF (congestive heart failure)    Chronic lower back pain    Dysrhythmia    GERD (gastroesophageal reflux disease)    H/O hiatal hernia    Hypertension    Migraine    "haven't had any in a long time" (09/19/2013)   Pneumonia ~ 2012    Surgeries: Procedure(s): RIGHT TOTAL KNEE ARTHROPLASTY on 09/21/2022   Consultants:   Discharged Condition: Improved  Hospital Course: ROQUEL CARNATHAN is an 77 y.o. female who was admitted 09/21/2022 for operative treatment ofPrimary osteoarthritis of right knee. Patient has severe unremitting pain that affects sleep, daily activities, and work/hobbies. After pre-op clearance the patient was taken to the operating room on 09/21/2022 and underwent  Procedure(s): RIGHT TOTAL KNEE ARTHROPLASTY.    Patient was given perioperative antibiotics:  Anti-infectives (From admission, onward)    Start     Dose/Rate Route Frequency Ordered Stop   09/21/22 1630  ceFAZolin (ANCEF) IVPB 2g/100 mL premix        2 g 200 mL/hr over 30 Minutes Intravenous Every 6 hours 09/21/22 1503 09/21/22 2251   09/21/22 0730  ceFAZolin (ANCEF) IVPB 2g/100 mL premix        2 g 200 mL/hr over 30 Minutes Intravenous On call to O.R. 09/21/22 0728 09/21/22 1027        Patient was given sequential compression devices, early ambulation, and chemoprophylaxis to prevent DVT.  Patient benefited maximally from hospital stay and there were no complications.    Recent vital signs: Patient Vitals for the past 24 hrs:  BP Temp Temp src Pulse  Resp SpO2  09/22/22 0607 133/75 97.8 F (36.6 C) Oral 87 17 95 %  09/22/22 0159 129/68 98 F (36.7 C) Oral 84 17 96 %  09/21/22 2019 113/87 98.2 F (36.8 C) Oral 88 17 96 %  09/21/22 1725 133/70 97.6 F (36.4 C) Oral 86 18 96 %  09/21/22 1508 (!) 156/76 97.8 F (36.6 C) Oral 82 15 97 %  09/21/22 1400 (!) 148/66 -- -- 84 18 96 %  09/21/22 1300 137/62 -- -- 82 16 99 %  09/21/22 1245 99/80 -- -- 80 13 100 %  09/21/22 1230 134/72 -- -- 82 11 99 %  09/21/22 1215 (!) 127/56 (!) 97.5 F (36.4 C) -- 81 18 97 %  09/21/22 0907 138/74 -- -- 87 16 99 %  09/21/22 0902 139/75 -- -- 85 14 98 %  09/21/22 0857 131/65 -- -- 80 13 98 %  09/21/22 0852 120/61 -- -- 83 14 97 %  09/21/22 0847 (!) 140/69 -- -- 93 14 98 %  09/21/22 0842 (!) 157/65 -- -- 90 19 99 %  09/21/22 0837 (!) 143/70 -- -- 83 11 98 %     Recent laboratory studies:  Recent Labs    09/22/22 0401  INR 1.2     Discharge Medications:   Allergies as of 09/22/2022       Reactions   Ace Inhibitors Cough   Codeine Nausea Only  GI Intolerance   Erythromycin Other (See Comments)   Stomach pain   Iohexol Hives    Code: HIVES, Desc: patient states develops hives w/ iv contrast/mms   Iodinated Contrast Media Other (See Comments)   Hives        Medication List     TAKE these medications    amoxicillin 500 MG capsule Commonly known as: AMOXIL Take 2,000 mg by mouth as directed. Dental procedures only   Biofreeze 4 % Gel Generic drug: Menthol (Topical Analgesic) Apply 1 application. topically daily as needed (pain).   citalopram 20 MG tablet Commonly known as: CELEXA Take 20 mg by mouth daily.   Co Q-10 100 MG Caps Take 100 mg by mouth daily.   colestipol 5 g packet Commonly known as: COLESTID Take 5 g by mouth 2 (two) times daily.   fexofenadine 180 MG tablet Commonly known as: ALLEGRA Take 180 mg by mouth at bedtime.   HYDROcodone-acetaminophen 5-325 MG tablet Commonly known as: NORCO/VICODIN Take 1-2  tablets by mouth every 6 (six) hours as needed for moderate pain or severe pain (post op pain).   Magnesium 250 MG Tabs Take 250 mg by mouth every Monday, Wednesday, and Friday.   methocarbamol 500 MG tablet Commonly known as: ROBAXIN Take 1 tablet (500 mg total) by mouth every 6 (six) hours as needed for muscle spasms.   metoprolol succinate 50 MG 24 hr tablet Commonly known as: TOPROL-XL Take 2 tablets (100 mg total) by mouth 2 (two) times daily.   PreserVision AREDS 2 Caps Take 1 capsule by mouth in the morning and at bedtime.   MEMORY VITE PO Take 3 tablets by mouth daily. Memory plus   multivitamin with minerals tablet Take 1 tablet by mouth daily. 50+ for her   potassium chloride 10 MEQ tablet Commonly known as: KLOR-CON Take 10 mEq by mouth daily.   RABEprazole 20 MG tablet Commonly known as: ACIPHEX Take 20 mg by mouth daily.   torsemide 20 MG tablet Commonly known as: DEMADEX Take 20 mg by mouth daily.   valsartan 160 MG tablet Commonly known as: DIOVAN Take 160 mg by mouth daily.   Vitamin D-3 125 MCG (5000 UT) Tabs Take 5,000 Units by mouth every Monday, Wednesday, and Friday.   warfarin 5 MG tablet Commonly known as: COUMADIN Take as directed. If you are unsure how to take this medication, talk to your nurse or doctor. Original instructions: TAKE AS DIRECTED BY COUMADIN CLINIC What changed: See the new instructions.               Durable Medical Equipment  (From admission, onward)           Start     Ordered   09/21/22 1504  DME Walker rolling  Once       Question:  Patient needs a walker to treat with the following condition  Answer:  Primary osteoarthritis of right knee   09/21/22 1503   09/21/22 1504  DME 3 n 1  Once        09/21/22 1503   09/21/22 1504  DME Bedside commode  Once       Question:  Patient needs a bedside commode to treat with the following condition  Answer:  Primary osteoarthritis of right knee   09/21/22 1503             Diagnostic Studies: No results found.  Disposition: Discharge disposition: 01-Home or Self Care  Discharge Instructions     Call MD / Call 911   Complete by: As directed    If you experience chest pain or shortness of breath, CALL 911 and be transported to the hospital emergency room.  If you develope a fever above 101 F, pus (white drainage) or increased drainage or redness at the wound, or calf pain, call your surgeon's office.   Constipation Prevention   Complete by: As directed    Drink plenty of fluids.  Prune juice may be helpful.  You may use a stool softener, such as Colace (over the counter) 100 mg twice a day.  Use MiraLax (over the counter) for constipation as needed.   Diet - low sodium heart healthy   Complete by: As directed    Discharge instructions   Complete by: As directed    INSTRUCTIONS AFTER JOINT REPLACEMENT   Remove items at home which could result in a fall. This includes throw rugs or furniture in walking pathways ICE to the affected joint every three hours while awake for 30 minutes at a time, for at least the first 3-5 days, and then as needed for pain and swelling.  Continue to use ice for pain and swelling. You may notice swelling that will progress down to the foot and ankle.  This is normal after surgery.  Elevate your leg when you are not up walking on it.   Continue to use the breathing machine you got in the hospital (incentive spirometer) which will help keep your temperature down.  It is common for your temperature to cycle up and down following surgery, especially at night when you are not up moving around and exerting yourself.  The breathing machine keeps your lungs expanded and your temperature down.   DIET:  As you were doing prior to hospitalization, we recommend a well-balanced diet.  DRESSING / WOUND CARE / SHOWERING  You may shower 3 days after surgery, but keep the wounds dry during showering.  You may use an occlusive  plastic wrap (Press'n Seal for example), NO SOAKING/SUBMERGING IN THE BATHTUB.  If the bandage gets wet, change with a clean dry gauze.  If the incision gets wet, pat the wound dry with a clean towel.  ACTIVITY  Increase activity slowly as tolerated, but follow the weight bearing instructions below.   No driving for 6 weeks or until further direction given by your physician.  You cannot drive while taking narcotics.  No lifting or carrying greater than 10 lbs. until further directed by your surgeon. Avoid periods of inactivity such as sitting longer than an hour when not asleep. This helps prevent blood clots.  You may return to work once you are authorized by your doctor.     WEIGHT BEARING   Weight bearing as tolerated with assist device (walker, cane, etc) as directed, use it as long as suggested by your surgeon or therapist, typically at least 4-6 weeks.   EXERCISES  Results after joint replacement surgery are often greatly improved when you follow the exercise, range of motion and muscle strengthening exercises prescribed by your doctor. Safety measures are also important to protect the joint from further injury. Any time any of these exercises cause you to have increased pain or swelling, decrease what you are doing until you are comfortable again and then slowly increase them. If you have problems or questions, call your caregiver or physical therapist for advice.   Rehabilitation is important following a joint replacement. After just a  few days of immobilization, the muscles of the leg can become weakened and shrink (atrophy).  These exercises are designed to build up the tone and strength of the thigh and leg muscles and to improve motion. Often times heat used for twenty to thirty minutes before working out will loosen up your tissues and help with improving the range of motion but do not use heat for the first two weeks following surgery (sometimes heat can increase post-operative  swelling).   These exercises can be done on a training (exercise) mat, on the floor, on a table or on a bed. Use whatever works the best and is most comfortable for you.    Use music or television while you are exercising so that the exercises are a pleasant break in your day. This will make your life better with the exercises acting as a break in your routine that you can look forward to.   Perform all exercises about fifteen times, three times per day or as directed.  You should exercise both the operative leg and the other leg as well.  Exercises include:   Quad Sets - Tighten up the muscle on the front of the thigh (Quad) and hold for 5-10 seconds.   Straight Leg Raises - With your knee straight (if you were given a brace, keep it on), lift the leg to 60 degrees, hold for 3 seconds, and slowly lower the leg.  Perform this exercise against resistance later as your leg gets stronger.  Leg Slides: Lying on your back, slowly slide your foot toward your buttocks, bending your knee up off the floor (only go as far as is comfortable). Then slowly slide your foot back down until your leg is flat on the floor again.  Angel Wings: Lying on your back spread your legs to the side as far apart as you can without causing discomfort.  Hamstring Strength:  Lying on your back, push your heel against the floor with your leg straight by tightening up the muscles of your buttocks.  Repeat, but this time bend your knee to a comfortable angle, and push your heel against the floor.  You may put a pillow under the heel to make it more comfortable if necessary.   A rehabilitation program following joint replacement surgery can speed recovery and prevent re-injury in the future due to weakened muscles. Contact your doctor or a physical therapist for more information on knee rehabilitation.    CONSTIPATION  Constipation is defined medically as fewer than three stools per week and severe constipation as less than one stool  per week.  Even if you have a regular bowel pattern at home, your normal regimen is likely to be disrupted due to multiple reasons following surgery.  Combination of anesthesia, postoperative narcotics, change in appetite and fluid intake all can affect your bowels.   YOU MUST use at least one of the following options; they are listed in order of increasing strength to get the job done.  They are all available over the counter, and you may need to use some, POSSIBLY even all of these options:    Drink plenty of fluids (prune juice may be helpful) and high fiber foods Colace 100 mg by mouth twice a day  Senokot for constipation as directed and as needed Dulcolax (bisacodyl), take with full glass of water  Miralax (polyethylene glycol) once or twice a day as needed.  If you have tried all these things and are unable to have a  bowel movement in the first 3-4 days after surgery call either your surgeon or your primary doctor.    If you experience loose stools or diarrhea, hold the medications until you stool forms back up.  If your symptoms do not get better within 1 week or if they get worse, check with your doctor.  If you experience "the worst abdominal pain ever" or develop nausea or vomiting, please contact the office immediately for further recommendations for treatment.   ITCHING:  If you experience itching with your medications, try taking only a single pain pill, or even half a pain pill at a time.  You can also use Benadryl over the counter for itching or also to help with sleep.   TED HOSE STOCKINGS:  Use stockings on both legs until for at least 2 weeks or as directed by physician office. They may be removed at night for sleeping.  MEDICATIONS:  See your medication summary on the "After Visit Summary" that nursing will review with you.  You may have some home medications which will be placed on hold until you complete the course of blood thinner medication.  It is important for you to  complete the blood thinner medication as prescribed.  PRECAUTIONS:  If you experience chest pain or shortness of breath - call 911 immediately for transfer to the hospital emergency department.   If you develop a fever greater that 101 F, purulent drainage from wound, increased redness or drainage from wound, foul odor from the wound/dressing, or calf pain - CONTACT YOUR SURGEON.                                                   FOLLOW-UP APPOINTMENTS:  If you do not already have a post-op appointment, please call the office for an appointment to be seen by your surgeon.  Guidelines for how soon to be seen are listed in your "After Visit Summary", but are typically between 1-4 weeks after surgery.  OTHER INSTRUCTIONS:   Knee Replacement:  Do not place pillow under knee, focus on keeping the knee straight while resting. CPM instructions: 0-90 degrees, 2 hours in the morning, 2 hours in the afternoon, and 2 hours in the evening. Place foam block, curve side up under heel at all times except when in CPM or when walking.  DO NOT modify, tear, cut, or change the foam block in any way.  POST-OPERATIVE OPIOID TAPER INSTRUCTIONS: It is important to wean off of your opioid medication as soon as possible. If you do not need pain medication after your surgery it is ok to stop day one. Opioids include: Codeine, Hydrocodone(Norco, Vicodin), Oxycodone(Percocet, oxycontin) and hydromorphone amongst others.  Long term and even short term use of opiods can cause: Increased pain response Dependence Constipation Depression Respiratory depression And more.  Withdrawal symptoms can include Flu like symptoms Nausea, vomiting And more Techniques to manage these symptoms Hydrate well Eat regular healthy meals Stay active Use relaxation techniques(deep breathing, meditating, yoga) Do Not substitute Alcohol to help with tapering If you have been on opioids for less than two weeks and do not have pain than it  is ok to stop all together.  Plan to wean off of opioids This plan should start within one week post op of your joint replacement. Maintain the same interval or time between taking each  dose and first decrease the dose.  Cut the total daily intake of opioids by one tablet each day Next start to increase the time between doses. The last dose that should be eliminated is the evening dose.     MAKE SURE YOU:  Understand these instructions.  Get help right away if you are not doing well or get worse.    Thank you for letting us be a part of your medical care team.  It is a privilege we respect greatly.  We hope these instructions will help you stay on track for a fast and full recovery!   Increase activity slowly as tolerated   Complete by: As directed    Post-operative opioid taper instructions:   Complete by: As directed    POST-OPERATIVE OPIOID TAPER INSTRUCTIONS: It is important to wean off of your opioid medication as soon as possible. If you do not need pain medication after your surgery it is ok to stop day one. Opioids include: Codeine, Hydrocodone(Norco, Vicodin), Oxycodone(Percocet, oxycontin) and hydromorphone amongst others.  Long term and even short term use of opiods can cause: Increased pain response Dependence Constipation Depression Respiratory depression And more.  Withdrawal symptoms can include Flu like symptoms Nausea, vomiting And more Techniques to manage these symptoms Hydrate well Eat regular healthy meals Stay active Use relaxation techniques(deep breathing, meditating, yoga) Do Not substitute Alcohol to help with tapering If you have been on opioids for less than two weeks and do not have pain than it is ok to stop all together.  Plan to wean off of opioids This plan should start within one week post op of your joint replacement. Maintain the same interval or time between taking each dose and first decrease the dose.  Cut the total daily intake of  opioids by one tablet each day Next start to increase the time between doses. The last dose that should be eliminated is the evening dose.           Follow-up Information     Melrose Nakayama, MD. Go on 10/01/2022.   Specialty: Orthopedic Surgery Why: Your appointment is scheduled for 10:15. Contact information: [place] [address] [phone]         [facility] Go on 09/24/2022.   Why: Your appointment is scheduled for 2:20. Please arrive at 2:10 to complete your paperwork Contact information: [facility] [place] [id number] [phone]                  Signed: Larwance Sachs Malashia Kamaka 09/22/2022, 8:15 AM

## 2022-09-22 NOTE — TOC Transition Note (Signed)
Transition of Care [facility]) - CM/SW Discharge Note   Patient Details  Name: AMIYA TESSENDORF MRN: [id number] Date of Birth: 03/03/1946  Transition of Care [facility]) CM/SW Contact:  Lennart Pall, LCSW Phone Number: 09/22/2022, 10:29 AM   Clinical Narrative:     Met with pt and confirming she has needed DME at home.  OPPT already arranged with [facility].  No TOC needs.  Final next level of care: OP Rehab Barriers to Discharge: No Barriers Identified   Patient Goals and CMS Choice      Discharge Placement                         Discharge Plan and Services Additional resources added to the After Visit Summary for                  DME Arranged: Walker rolling DME Agency: [facility]                  Social Determinants of Health (SDOH) Interventions SDOH Screenings   Food Insecurity: No Food Insecurity (09/21/2022)  Housing: Low Risk  (09/21/2022)  Transportation Needs: No Transportation Needs (09/21/2022)  Utilities: Not At Risk (09/21/2022)  Tobacco Use: Low Risk  (09/21/2022)     Readmission Risk Interventions     No data to display

## 2022-09-22 NOTE — Progress Notes (Signed)
Physical Therapy Treatment Patient Details Name: Sierra [name] MRN: [id number] DOB: [birth year] Today's Date: 09/22/2022   History of Present Illness 77 yo female presents to therapy s/p R TKA on 09/21/2022 due to failure of conservative measures. Pt PMH includes but is not limited to: L reverse TSA, LBP, dCHF, HDL, A-fib, HTN, R RTC repair, B THA and L THA revision and TMJ sx.    PT Comments    Pt is progressing well with mobility, she tolerated increased ambulation distance of  100' with RW, no loss of balance. Pt was instructed in TKA HEP, she demonstrates good understanding. Will plan to do stair training later today, then I expect she'll be ready to DC home from a PT standpoint.   Recommendations for follow up therapy are one component of a multi-disciplinary discharge planning process, led by the attending physician.  Recommendations may be updated based on patient status, additional functional criteria and insurance authorization.  Follow Up Recommendations       Assistance Recommended at Discharge    Patient can return home with the following A little help with walking and/or transfers;A little help with bathing/dressing/bathroom;Assistance with cooking/housework;Assist for transportation;Help with stairs or ramp for entrance   Equipment Recommendations  None recommended by PT    Recommendations for Other Services       Precautions / Restrictions Precautions Precautions: Knee;Fall Precaution Booklet Issued: Yes (comment) Precaution Comments: reviewed no pillow under knee Restrictions Weight Bearing Restrictions: No RLE Weight Bearing: Weight bearing as tolerated     Mobility  Bed Mobility               General bed mobility comments: up at edge of bed    Transfers Overall transfer level: Needs assistance Equipment used: Rolling walker (2 wheels) Transfers: Sit to/from Stand Sit to Stand: Supervision           General transfer comment: cues for proper UE  placement    Ambulation/Gait Ambulation/Gait assistance: Supervision Gait Distance (Feet): 100 Feet Assistive device: Rolling walker (2 wheels) Gait Pattern/deviations: Step-to pattern, Decreased step length - right, Decreased step length - left Gait velocity: decr     General Gait Details: ongoing VCs for sequencing, no buckling, no loss of balance   Stairs             Wheelchair Mobility    Modified Rankin (Stroke Patients Only)       Balance Overall balance assessment: Needs assistance Sitting-balance support: Feet supported (limited B knee flexion EOB) Sitting balance-Leahy Scale: Fair     Standing balance support: Bilateral upper extremity supported, During functional activity, Reliant on assistive device for balance Standing balance-Leahy Scale: Poor                              Cognition Arousal/Alertness: Awake/alert Behavior During Therapy: WFL for tasks assessed/performed Overall Cognitive Status: Within Functional Limits for tasks assessed                                          Exercises Total Joint Exercises Ankle Circles/Pumps: AROM, Both, 15 reps, Supine Quad Sets: AROM, Both, 5 reps, Supine Short Arc Quad: AROM, Right, 10 reps, Supine Heel Slides: AAROM, Right, 5 reps, Supine Hip ABduction/ADduction: AAROM, Right, 10 reps, Supine Straight Leg Raises: AROM, Right, 5 reps, Supine Long Arc Quad: AROM, Right,  10 reps, Seated Knee Flexion: AAROM, Right, 10 reps, Seated Goniometric ROM: 5-60* AAROM R knee    General Comments        Pertinent Vitals/Pain Pain Assessment Pain Score: 5  Pain Location: R knee Pain Descriptors / Indicators: Aching, Constant, Operative site guarding, Discomfort Pain Intervention(s): Limited activity within patient's tolerance, Monitored during session, Premedicated before session, Ice applied    Home Living                          Prior Function            PT  Goals (current goals can now be found in the care plan section) Acute Rehab PT Goals Patient Stated Goal: go on vacation and return to swimming pool this summer PT Goal Formulation: With patient Time For Goal Achievement: 10/05/22 Potential to Achieve Goals: Good Progress towards PT goals: Progressing toward goals    Frequency    7X/week      PT Plan Current plan remains appropriate    Co-evaluation              AM-PAC PT "6 Clicks" Mobility   Outcome Measure  Help needed turning from your back to your side while in a flat bed without using bedrails?: A Little Help needed moving from lying on your back to sitting on the side of a flat bed without using bedrails?: A Little Help needed moving to and from a bed to a chair (including a wheelchair)?: A Little Help needed standing up from a chair using your arms (e.g., wheelchair or bedside chair)?: A Little Help needed to walk in hospital room?: A Little Help needed climbing 3-5 steps with a railing? : A Lot 6 Click Score: 17    End of Session Equipment Utilized During Treatment: Gait belt Activity Tolerance: Patient limited by pain;Patient limited by fatigue Patient left: in chair;with call bell/phone within reach;with chair alarm set Nurse Communication: Mobility status PT Visit Diagnosis: Unsteadiness on feet (R26.81);Other abnormalities of gait and mobility (R26.89);Muscle weakness (generalized) (M62.81);Pain;Difficulty in walking, not elsewhere classified (R26.2) Pain - Right/Left: Right Pain - part of body: Knee     Time: [id number] PT Time Calculation (min) (ACUTE ONLY): 35 min  Charges:  $Gait Training: 8-22 mins $Therapeutic Exercise: 8-22 mins                     Blondell Reveal Kistler PT 09/22/2022  Acute Rehabilitation Services  Office [phone]

## 2022-09-22 NOTE — Plan of Care (Signed)
Problem: Education: Goal: Knowledge of the prescribed therapeutic regimen will improve 09/22/2022 1458 by Jonn Shingles, LPN Outcome: Adequate for Discharge 09/22/2022 0754 by Jonn Shingles, LPN Outcome: Progressing Goal: Individualized Educational Video(s) 09/22/2022 1458 by Jonn Shingles, LPN Outcome: Adequate for Discharge 09/22/2022 0754 by Jonn Shingles, LPN Outcome: Progressing   Problem: Activity: Goal: Ability to avoid complications of mobility impairment will improve 09/22/2022 1458 by Jonn Shingles, LPN Outcome: Adequate for Discharge 09/22/2022 0754 by Jonn Shingles, LPN Outcome: Progressing Goal: Range of joint motion will improve 09/22/2022 1458 by Jonn Shingles, LPN Outcome: Adequate for Discharge 09/22/2022 0754 by Jonn Shingles, LPN Outcome: Progressing   Problem: Clinical Measurements: Goal: Postoperative complications will be avoided or minimized 09/22/2022 1458 by Jonn Shingles, LPN Outcome: Adequate for Discharge 09/22/2022 0754 by Jonn Shingles, LPN Outcome: Progressing   Problem: Pain Management: Goal: Pain level will decrease with appropriate interventions 09/22/2022 1458 by Jonn Shingles, LPN Outcome: Adequate for Discharge 09/22/2022 0754 by Jonn Shingles, LPN Outcome: Progressing   Problem: Skin Integrity: Goal: Will show signs of wound healing 09/22/2022 1458 by Jonn Shingles, LPN Outcome: Adequate for Discharge 09/22/2022 0754 by Jonn Shingles, LPN Outcome: Progressing   Problem: Education: Goal: Knowledge of General Education information will improve Description: Including pain rating scale, medication(s)/side effects and non-pharmacologic comfort measures 09/22/2022 1458 by Jonn Shingles, LPN Outcome: Adequate for Discharge 09/22/2022 0754 by Jonn Shingles, LPN Outcome: Progressing   Problem: Health Behavior/Discharge Planning: Goal: Ability to manage health-related needs will improve 09/22/2022 1458 by Jonn Shingles,  LPN Outcome: Adequate for Discharge 09/22/2022 0754 by Jonn Shingles, LPN Outcome: Progressing   Problem: Clinical Measurements: Goal: Ability to maintain clinical measurements within normal limits will improve 09/22/2022 1458 by Jonn Shingles, LPN Outcome: Adequate for Discharge 09/22/2022 0754 by Jonn Shingles, LPN Outcome: Progressing Goal: Will remain free from infection 09/22/2022 1458 by Jonn Shingles, LPN Outcome: Adequate for Discharge 09/22/2022 0754 by Jonn Shingles, LPN Outcome: Progressing Goal: Diagnostic test results will improve 09/22/2022 1458 by Jonn Shingles, LPN Outcome: Adequate for Discharge 09/22/2022 0754 by Jonn Shingles, LPN Outcome: Progressing Goal: Respiratory complications will improve 09/22/2022 1458 by Jonn Shingles, LPN Outcome: Adequate for Discharge 09/22/2022 0754 by Jonn Shingles, LPN Outcome: Progressing Goal: Cardiovascular complication will be avoided 09/22/2022 1458 by Jonn Shingles, LPN Outcome: Adequate for Discharge 09/22/2022 0754 by Jonn Shingles, LPN Outcome: Progressing   Problem: Activity: Goal: Risk for activity intolerance will decrease 09/22/2022 1458 by Jonn Shingles, LPN Outcome: Adequate for Discharge 09/22/2022 0754 by Jonn Shingles, LPN Outcome: Progressing   Problem: Nutrition: Goal: Adequate nutrition will be maintained 09/22/2022 1458 by Jonn Shingles, LPN Outcome: Adequate for Discharge 09/22/2022 0754 by Jonn Shingles, LPN Outcome: Progressing   Problem: Coping: Goal: Level of anxiety will decrease 09/22/2022 1458 by Jonn Shingles, LPN Outcome: Adequate for Discharge 09/22/2022 0754 by Jonn Shingles, LPN Outcome: Progressing   Problem: Elimination: Goal: Will not experience complications related to bowel motility 09/22/2022 1458 by Jonn Shingles, LPN Outcome: Adequate for Discharge 09/22/2022 0754 by Jonn Shingles, LPN Outcome: Progressing Goal: Will not experience complications related to urinary  retention 09/22/2022 1458 by Jonn Shingles, LPN Outcome: Adequate for Discharge 09/22/2022 0754 by Jonn Shingles, LPN Outcome: Progressing   Problem: Pain Managment: Goal: General experience of comfort will improve 09/22/2022 1458 by Jonn Shingles, LPN Outcome: Adequate for Discharge 09/22/2022 0754 by Jonn Shingles, LPN Outcome: Progressing   Problem: Safety: Goal: Ability to remain free from injury will improve 09/22/2022 1458 by Jonn Shingles, LPN Outcome: Adequate for Discharge  09/22/2022 0754 by Jonn Shingles, LPN Outcome: Progressing   Problem: Skin Integrity: Goal: Risk for impaired skin integrity will decrease 09/22/2022 1458 by Jonn Shingles, LPN Outcome: Adequate for Discharge 09/22/2022 0754 by Jonn Shingles, LPN Outcome: Progressing

## 2022-09-22 NOTE — Progress Notes (Signed)
Physical Therapy Treatment Patient Details Name: Sierra [name] MRN: [id number] DOB: [birth year] Today's Date: 09/22/2022   History of Present Illness 77 yo female presents to therapy s/p R TKA on 09/21/2022 due to failure of conservative measures. Pt PMH includes but is not limited to: L reverse TSA, LBP, dCHF, HDL, A-fib, HTN, R RTC repair, B THA and L THA revision and TMJ sx.    PT Comments    Stair training completed. Pt is ready to DC home from a PT standpoint.    Recommendations for follow up therapy are one component of a multi-disciplinary discharge planning process, led by the attending physician.  Recommendations may be updated based on patient status, additional functional criteria and insurance authorization.  Follow Up Recommendations       Assistance Recommended at Discharge Intermittent Supervision/Assistance  Patient can return home with the following A little help with walking and/or transfers;A little help with bathing/dressing/bathroom;Assistance with cooking/housework;Assist for transportation;Help with stairs or ramp for entrance   Equipment Recommendations  None recommended by PT    Recommendations for Other Services       Precautions / Restrictions Precautions Precautions: Knee;Fall Precaution Booklet Issued: Yes (comment) Precaution Comments: reviewed no pillow under knee Restrictions Weight Bearing Restrictions: No RLE Weight Bearing: Weight bearing as tolerated     Mobility  Bed Mobility               General bed mobility comments: up in recliner    Transfers Overall transfer level: Needs assistance Equipment used: Rolling walker (2 wheels) Transfers: Sit to/from Stand Sit to Stand: Min assist           General transfer comment: min A to power up from recliner, VCs hand placement    Ambulation/Gait Ambulation/Gait assistance: Supervision Gait Distance (Feet): 85 Feet Assistive device: Rolling walker (2 wheels) Gait  Pattern/deviations: Step-to pattern, Decreased step length - right, Decreased step length - left Gait velocity: decr     General Gait Details: ongoing VCs for sequencing, no buckling, no loss of balance   Stairs Stairs: Yes Stairs assistance: Min guard Stair Management: Sideways, Step to pattern, One rail Right Number of Stairs: 3 General stair comments: VCs sequencing, increased time/effort 2* pain/fatigue, no buckling   Wheelchair Mobility    Modified Rankin (Stroke Patients Only)       Balance Overall balance assessment: Needs assistance Sitting-balance support: Feet supported (limited B knee flexion EOB) Sitting balance-Leahy Scale: Fair     Standing balance support: Bilateral upper extremity supported, During functional activity, Reliant on assistive device for balance Standing balance-Leahy Scale: Poor                              Cognition Arousal/Alertness: Awake/alert Behavior During Therapy: WFL for tasks assessed/performed Overall Cognitive Status: Within Functional Limits for tasks assessed                                          Exercises Total Joint Exercises Ankle Circles/Pumps: AROM, Both, 15 reps, Supine Quad Sets: AROM, Both, 5 reps, Supine Short Arc Quad: AROM, Right, 10 reps, Supine Heel Slides: AAROM, Right, 5 reps, Supine Hip ABduction/ADduction: AAROM, Right, 10 reps, Supine Straight Leg Raises: AROM, Right, 5 reps, Supine Long Arc Quad: AROM, Right, 10 reps, Seated Knee Flexion: AAROM, Right, 10 reps, Seated Goniometric ROM: 5-60*  AAROM R knee    General Comments        Pertinent Vitals/Pain Pain Assessment Pain Score: 5  Pain Location: R knee Pain Descriptors / Indicators: Aching, Constant, Operative site guarding, Discomfort Pain Intervention(s): Limited activity within patient's tolerance, Monitored during session, Patient requesting pain meds-RN notified, Ice applied    Home Living                           Prior Function            PT Goals (current goals can now be found in the care plan section) Acute Rehab PT Goals Patient Stated Goal: go on vacation and return to swimming pool this summer PT Goal Formulation: With patient Time For Goal Achievement: 10/05/22 Potential to Achieve Goals: Good Progress towards PT goals: Progressing toward goals    Frequency    7X/week      PT Plan Current plan remains appropriate    Co-evaluation              AM-PAC PT "6 Clicks" Mobility   Outcome Measure  Help needed turning from your back to your side while in a flat bed without using bedrails?: A Little Help needed moving from lying on your back to sitting on the side of a flat bed without using bedrails?: A Little Help needed moving to and from a bed to a chair (including a wheelchair)?: A Little Help needed standing up from a chair using your arms (e.g., wheelchair or bedside chair)?: A Little Help needed to walk in hospital room?: None Help needed climbing 3-5 steps with a railing? : A Little 6 Click Score: 19    End of Session Equipment Utilized During Treatment: Gait belt Activity Tolerance: Patient limited by pain;Patient limited by fatigue Patient left: with call bell/phone within reach;in bed;with bed alarm set;with nursing/sitter in room Nurse Communication: Mobility status PT Visit Diagnosis: Unsteadiness on feet (R26.81);Other abnormalities of gait and mobility (R26.89);Muscle weakness (generalized) (M62.81);Pain;Difficulty in walking, not elsewhere classified (R26.2) Pain - Right/Left: Right Pain - part of body: Knee     Time: [id number] PT Time Calculation (min) (ACUTE ONLY): 34 min  Charges:  $Gait Training: 8-22 mins  $Therapeutic Activity: 8-22 mins                     Blondell Reveal Kistler PT 09/22/2022  Acute Rehabilitation Services  Office [phone]

## 2022-09-22 NOTE — Anesthesia Postprocedure Evaluation (Signed)
Anesthesia Post Note  Patient: Sierra [name]  Procedure(s) Performed: RIGHT TOTAL KNEE ARTHROPLASTY (Right: Knee)     Patient location during evaluation: PACU Anesthesia Type: Regional, MAC and Spinal Level of consciousness: awake and alert and oriented Pain management: pain level controlled Vital Signs Assessment: post-procedure vital signs reviewed and stable Respiratory status: spontaneous breathing, nonlabored ventilation and respiratory function stable Cardiovascular status: blood pressure returned to baseline and stable Postop Assessment: no headache, no backache, spinal receding and patient able to bend at knees Anesthetic complications: no   No notable events documented.  Last Vitals:  Vitals:   09/22/22 0159 09/22/22 0607  BP: 129/68 133/75  Pulse: 84 87  Resp: 17 17  Temp: 36.7 C 36.6 C  SpO2: 96% 95%    Last Pain:  Vitals:   09/22/22 0703  TempSrc:   PainSc: [place]

## 2022-09-22 NOTE — Progress Notes (Signed)
Subjective: 1 Day Post-Op Procedure(s) (LRB): RIGHT TOTAL KNEE ARTHROPLASTY (Right)  Patient doing well. She is hoping to go home today.  Activity level:  wbat Diet tolerance:  ok Voiding:  ok Patient reports pain as mild.    Objective: Vital signs in last 24 hours: Temp:  [97.5 F (36.4 C)-98.2 F (36.8 C)] 97.8 F (36.6 C) (04/03 0607) Pulse Rate:  [80-93] 87 (04/03 0607) Resp:  [11-19] 17 (04/03 0607) BP: (99-157)/(56-87) 133/75 (04/03 0607) SpO2:  [95 %-100 %] 95 % (04/03 0607)  Labs: No results for input(s): "HGB" in the last 72 hours. No results for input(s): "WBC", "RBC", "HCT", "PLT" in the last 72 hours. No results for input(s): "NA", "K", "CL", "CO2", "BUN", "CREATININE", "GLUCOSE", "CALCIUM" in the last 72 hours. Recent Labs    09/22/22 0401  INR 1.2    Physical Exam:  Neurologically intact ABD soft Neurovascular intact Sensation intact distally Intact pulses distally Dorsiflexion/Plantar flexion intact Incision: dressing C/D/I and scant drainage No cellulitis present Compartment soft  Assessment/Plan:  1 Day Post-Op Procedure(s) (LRB): RIGHT TOTAL KNEE ARTHROPLASTY (Right) Advance diet Up with therapy D/C IV fluids Discharge home with home health today if cleared and doing well with PT.  Follow up in office 2 weeks post op. Resume home coumadin.  Larwance Sachs Charika Mikelson 09/22/2022, 8:07 AM

## 2022-09-23 ENCOUNTER — Encounter (HOSPITAL_COMMUNITY): Payer: Self-pay | Admitting: Orthopaedic Surgery

## 2022-09-24 DIAGNOSIS — M25661 Stiffness of right knee, not elsewhere classified: Secondary | ICD-10-CM | POA: Diagnosis not present

## 2022-09-24 DIAGNOSIS — Z96651 Presence of right artificial knee joint: Secondary | ICD-10-CM | POA: Diagnosis not present

## 2022-09-26 DIAGNOSIS — Z96651 Presence of right artificial knee joint: Secondary | ICD-10-CM | POA: Diagnosis not present

## 2022-09-26 DIAGNOSIS — M47817 Spondylosis without myelopathy or radiculopathy, lumbosacral region: Secondary | ICD-10-CM | POA: Diagnosis not present

## 2022-09-26 DIAGNOSIS — I110 Hypertensive heart disease with heart failure: Secondary | ICD-10-CM | POA: Diagnosis not present

## 2022-09-26 DIAGNOSIS — M1712 Unilateral primary osteoarthritis, left knee: Secondary | ICD-10-CM | POA: Diagnosis not present

## 2022-09-26 DIAGNOSIS — K589 Irritable bowel syndrome without diarrhea: Secondary | ICD-10-CM | POA: Diagnosis not present

## 2022-09-26 DIAGNOSIS — F419 Anxiety disorder, unspecified: Secondary | ICD-10-CM | POA: Diagnosis not present

## 2022-09-26 DIAGNOSIS — I4891 Unspecified atrial fibrillation: Secondary | ICD-10-CM | POA: Diagnosis not present

## 2022-09-26 DIAGNOSIS — M47812 Spondylosis without myelopathy or radiculopathy, cervical region: Secondary | ICD-10-CM | POA: Diagnosis not present

## 2022-09-26 DIAGNOSIS — Z96643 Presence of artificial hip joint, bilateral: Secondary | ICD-10-CM | POA: Diagnosis not present

## 2022-09-26 DIAGNOSIS — D649 Anemia, unspecified: Secondary | ICD-10-CM | POA: Diagnosis not present

## 2022-09-26 DIAGNOSIS — Z792 Long term (current) use of antibiotics: Secondary | ICD-10-CM | POA: Diagnosis not present

## 2022-09-26 DIAGNOSIS — E785 Hyperlipidemia, unspecified: Secondary | ICD-10-CM | POA: Diagnosis not present

## 2022-09-26 DIAGNOSIS — I5030 Unspecified diastolic (congestive) heart failure: Secondary | ICD-10-CM | POA: Diagnosis not present

## 2022-09-26 DIAGNOSIS — F32A Depression, unspecified: Secondary | ICD-10-CM | POA: Diagnosis not present

## 2022-09-26 DIAGNOSIS — Z471 Aftercare following joint replacement surgery: Secondary | ICD-10-CM | POA: Diagnosis not present

## 2022-09-26 DIAGNOSIS — K219 Gastro-esophageal reflux disease without esophagitis: Secondary | ICD-10-CM | POA: Diagnosis not present

## 2022-09-26 DIAGNOSIS — H3530 Unspecified macular degeneration: Secondary | ICD-10-CM | POA: Diagnosis not present

## 2022-09-28 DIAGNOSIS — I4891 Unspecified atrial fibrillation: Secondary | ICD-10-CM | POA: Diagnosis not present

## 2022-09-28 DIAGNOSIS — I110 Hypertensive heart disease with heart failure: Secondary | ICD-10-CM | POA: Diagnosis not present

## 2022-09-28 DIAGNOSIS — D649 Anemia, unspecified: Secondary | ICD-10-CM | POA: Diagnosis not present

## 2022-09-28 DIAGNOSIS — Z471 Aftercare following joint replacement surgery: Secondary | ICD-10-CM | POA: Diagnosis not present

## 2022-09-28 DIAGNOSIS — I5030 Unspecified diastolic (congestive) heart failure: Secondary | ICD-10-CM | POA: Diagnosis not present

## 2022-09-28 DIAGNOSIS — Z96651 Presence of right artificial knee joint: Secondary | ICD-10-CM | POA: Diagnosis not present

## 2022-09-30 DIAGNOSIS — D649 Anemia, unspecified: Secondary | ICD-10-CM | POA: Diagnosis not present

## 2022-09-30 DIAGNOSIS — I110 Hypertensive heart disease with heart failure: Secondary | ICD-10-CM | POA: Diagnosis not present

## 2022-09-30 DIAGNOSIS — Z471 Aftercare following joint replacement surgery: Secondary | ICD-10-CM | POA: Diagnosis not present

## 2022-09-30 DIAGNOSIS — Z96651 Presence of right artificial knee joint: Secondary | ICD-10-CM | POA: Diagnosis not present

## 2022-09-30 DIAGNOSIS — I5030 Unspecified diastolic (congestive) heart failure: Secondary | ICD-10-CM | POA: Diagnosis not present

## 2022-09-30 DIAGNOSIS — I4891 Unspecified atrial fibrillation: Secondary | ICD-10-CM | POA: Diagnosis not present

## 2022-10-04 DIAGNOSIS — Z96651 Presence of right artificial knee joint: Secondary | ICD-10-CM | POA: Diagnosis not present

## 2022-10-04 DIAGNOSIS — I5030 Unspecified diastolic (congestive) heart failure: Secondary | ICD-10-CM | POA: Diagnosis not present

## 2022-10-04 DIAGNOSIS — Z471 Aftercare following joint replacement surgery: Secondary | ICD-10-CM | POA: Diagnosis not present

## 2022-10-04 DIAGNOSIS — I110 Hypertensive heart disease with heart failure: Secondary | ICD-10-CM | POA: Diagnosis not present

## 2022-10-04 DIAGNOSIS — I4891 Unspecified atrial fibrillation: Secondary | ICD-10-CM | POA: Diagnosis not present

## 2022-10-04 DIAGNOSIS — D649 Anemia, unspecified: Secondary | ICD-10-CM | POA: Diagnosis not present

## 2022-10-04 DIAGNOSIS — M25661 Stiffness of right knee, not elsewhere classified: Secondary | ICD-10-CM | POA: Diagnosis not present

## 2022-10-05 ENCOUNTER — Ambulatory Visit: Payer: Medicare Other | Attending: Interventional Cardiology | Admitting: *Deleted

## 2022-10-05 DIAGNOSIS — Z5181 Encounter for therapeutic drug level monitoring: Secondary | ICD-10-CM | POA: Diagnosis not present

## 2022-10-05 DIAGNOSIS — I480 Paroxysmal atrial fibrillation: Secondary | ICD-10-CM | POA: Diagnosis not present

## 2022-10-05 LAB — POCT INR: INR: 2.4 (ref 2.0–3.0)

## 2022-10-05 NOTE — Patient Instructions (Signed)
Description   Continue taking 1/2 tablet daily except 1 tablet on Mondays and Fridays. Recheck 3 weeks. Call with any new medications, any new procedures, and with any concerns  [phone] or [phone] or [phone] Procedure Fax [phone] or [phone]

## 2022-10-06 ENCOUNTER — Telehealth: Payer: Self-pay | Admitting: *Deleted

## 2022-10-06 DIAGNOSIS — Z471 Aftercare following joint replacement surgery: Secondary | ICD-10-CM | POA: Diagnosis not present

## 2022-10-06 DIAGNOSIS — Z96651 Presence of right artificial knee joint: Secondary | ICD-10-CM | POA: Diagnosis not present

## 2022-10-06 DIAGNOSIS — I4891 Unspecified atrial fibrillation: Secondary | ICD-10-CM | POA: Diagnosis not present

## 2022-10-06 DIAGNOSIS — I5030 Unspecified diastolic (congestive) heart failure: Secondary | ICD-10-CM | POA: Diagnosis not present

## 2022-10-06 DIAGNOSIS — D649 Anemia, unspecified: Secondary | ICD-10-CM | POA: Diagnosis not present

## 2022-10-06 DIAGNOSIS — I110 Hypertensive heart disease with heart failure: Secondary | ICD-10-CM | POA: Diagnosis not present

## 2022-10-06 NOTE — Telephone Encounter (Signed)
Received a voicemail stating to call them in reference to next appointment.  Called and spoke with husband and confirmed appt for 10/26/22 at 215pm. They were thankful for the call back.

## 2022-10-08 DIAGNOSIS — I110 Hypertensive heart disease with heart failure: Secondary | ICD-10-CM | POA: Diagnosis not present

## 2022-10-08 DIAGNOSIS — Z96651 Presence of right artificial knee joint: Secondary | ICD-10-CM | POA: Diagnosis not present

## 2022-10-08 DIAGNOSIS — Z471 Aftercare following joint replacement surgery: Secondary | ICD-10-CM | POA: Diagnosis not present

## 2022-10-08 DIAGNOSIS — I5030 Unspecified diastolic (congestive) heart failure: Secondary | ICD-10-CM | POA: Diagnosis not present

## 2022-10-08 DIAGNOSIS — D649 Anemia, unspecified: Secondary | ICD-10-CM | POA: Diagnosis not present

## 2022-10-08 DIAGNOSIS — I4891 Unspecified atrial fibrillation: Secondary | ICD-10-CM | POA: Diagnosis not present

## 2022-10-19 DIAGNOSIS — M25661 Stiffness of right knee, not elsewhere classified: Secondary | ICD-10-CM | POA: Diagnosis not present

## 2022-10-19 DIAGNOSIS — Z96651 Presence of right artificial knee joint: Secondary | ICD-10-CM | POA: Diagnosis not present

## 2022-10-21 DIAGNOSIS — Z96651 Presence of right artificial knee joint: Secondary | ICD-10-CM | POA: Diagnosis not present

## 2022-10-21 DIAGNOSIS — M25661 Stiffness of right knee, not elsewhere classified: Secondary | ICD-10-CM | POA: Diagnosis not present

## 2022-10-25 DIAGNOSIS — Z96651 Presence of right artificial knee joint: Secondary | ICD-10-CM | POA: Diagnosis not present

## 2022-10-25 DIAGNOSIS — M25661 Stiffness of right knee, not elsewhere classified: Secondary | ICD-10-CM | POA: Diagnosis not present

## 2022-10-26 ENCOUNTER — Ambulatory Visit: Payer: Medicare Other | Attending: Interventional Cardiology | Admitting: *Deleted

## 2022-10-26 DIAGNOSIS — I480 Paroxysmal atrial fibrillation: Secondary | ICD-10-CM | POA: Diagnosis not present

## 2022-10-26 DIAGNOSIS — Z5181 Encounter for therapeutic drug level monitoring: Secondary | ICD-10-CM

## 2022-10-26 LAB — POCT INR: INR: 2.4 (ref 2.0–3.0)

## 2022-10-26 NOTE — Patient Instructions (Signed)
Description   Continue taking warfarin 1/2 tablet daily except 1 tablet on Mondays and Fridays. Recheck 4 weeks. Call with any new medications, any new procedures, and with any concerns  [phone] or [phone] or [phone] Procedure Fax [phone] or [phone]

## 2022-10-29 DIAGNOSIS — M25661 Stiffness of right knee, not elsewhere classified: Secondary | ICD-10-CM | POA: Diagnosis not present

## 2022-10-29 DIAGNOSIS — Z96651 Presence of right artificial knee joint: Secondary | ICD-10-CM | POA: Diagnosis not present

## 2022-11-01 DIAGNOSIS — M25661 Stiffness of right knee, not elsewhere classified: Secondary | ICD-10-CM | POA: Diagnosis not present

## 2022-11-01 DIAGNOSIS — Z96651 Presence of right artificial knee joint: Secondary | ICD-10-CM | POA: Diagnosis not present

## 2022-11-03 DIAGNOSIS — Z96651 Presence of right artificial knee joint: Secondary | ICD-10-CM | POA: Diagnosis not present

## 2022-11-03 DIAGNOSIS — M25661 Stiffness of right knee, not elsewhere classified: Secondary | ICD-10-CM | POA: Diagnosis not present

## 2022-11-09 DIAGNOSIS — M205X2 Other deformities of toe(s) (acquired), left foot: Secondary | ICD-10-CM | POA: Diagnosis not present

## 2022-11-09 DIAGNOSIS — I70203 Unspecified atherosclerosis of native arteries of extremities, bilateral legs: Secondary | ICD-10-CM | POA: Diagnosis not present

## 2022-11-09 DIAGNOSIS — L609 Nail disorder, unspecified: Secondary | ICD-10-CM | POA: Diagnosis not present

## 2022-11-09 DIAGNOSIS — M205X1 Other deformities of toe(s) (acquired), right foot: Secondary | ICD-10-CM | POA: Diagnosis not present

## 2022-11-09 DIAGNOSIS — B351 Tinea unguium: Secondary | ICD-10-CM | POA: Diagnosis not present

## 2022-11-09 DIAGNOSIS — I739 Peripheral vascular disease, unspecified: Secondary | ICD-10-CM | POA: Diagnosis not present

## 2022-11-09 DIAGNOSIS — L602 Onychogryphosis: Secondary | ICD-10-CM | POA: Diagnosis not present

## 2022-11-10 DIAGNOSIS — M25661 Stiffness of right knee, not elsewhere classified: Secondary | ICD-10-CM | POA: Diagnosis not present

## 2022-11-10 DIAGNOSIS — Z96651 Presence of right artificial knee joint: Secondary | ICD-10-CM | POA: Diagnosis not present

## 2022-11-16 DIAGNOSIS — Z96651 Presence of right artificial knee joint: Secondary | ICD-10-CM | POA: Diagnosis not present

## 2022-11-16 DIAGNOSIS — M25661 Stiffness of right knee, not elsewhere classified: Secondary | ICD-10-CM | POA: Diagnosis not present

## 2022-11-18 DIAGNOSIS — L601 Onycholysis: Secondary | ICD-10-CM | POA: Diagnosis not present

## 2022-11-19 DIAGNOSIS — M25661 Stiffness of right knee, not elsewhere classified: Secondary | ICD-10-CM | POA: Diagnosis not present

## 2022-11-19 DIAGNOSIS — Z96651 Presence of right artificial knee joint: Secondary | ICD-10-CM | POA: Diagnosis not present

## 2022-11-22 DIAGNOSIS — M25661 Stiffness of right knee, not elsewhere classified: Secondary | ICD-10-CM | POA: Diagnosis not present

## 2022-11-22 DIAGNOSIS — Z96651 Presence of right artificial knee joint: Secondary | ICD-10-CM | POA: Diagnosis not present

## 2022-11-23 ENCOUNTER — Ambulatory Visit: Payer: Medicare Other | Attending: Cardiology | Admitting: *Deleted

## 2022-11-23 DIAGNOSIS — I480 Paroxysmal atrial fibrillation: Secondary | ICD-10-CM

## 2022-11-23 DIAGNOSIS — Z5181 Encounter for therapeutic drug level monitoring: Secondary | ICD-10-CM

## 2022-11-23 LAB — POCT INR: INR: 2.5 (ref 2.0–3.0)

## 2022-11-23 NOTE — Patient Instructions (Signed)
Description   Continue taking warfarin 1/2 tablet daily except 1 tablet on Mondays and Fridays. Recheck 5 weeks. Call with any new medications, any new procedures, and with any concerns  [phone] or [phone] or [phone] Procedure Fax [phone] or [phone]

## 2022-11-25 DIAGNOSIS — Z96651 Presence of right artificial knee joint: Secondary | ICD-10-CM | POA: Diagnosis not present

## 2022-11-25 DIAGNOSIS — M25661 Stiffness of right knee, not elsewhere classified: Secondary | ICD-10-CM | POA: Diagnosis not present

## 2022-11-30 DIAGNOSIS — M25661 Stiffness of right knee, not elsewhere classified: Secondary | ICD-10-CM | POA: Diagnosis not present

## 2022-11-30 DIAGNOSIS — Z96651 Presence of right artificial knee joint: Secondary | ICD-10-CM | POA: Diagnosis not present

## 2022-12-01 DIAGNOSIS — D485 Neoplasm of uncertain behavior of skin: Secondary | ICD-10-CM | POA: Diagnosis not present

## 2022-12-01 DIAGNOSIS — D225 Melanocytic nevi of trunk: Secondary | ICD-10-CM | POA: Diagnosis not present

## 2022-12-01 DIAGNOSIS — L570 Actinic keratosis: Secondary | ICD-10-CM | POA: Diagnosis not present

## 2022-12-02 DIAGNOSIS — Z96651 Presence of right artificial knee joint: Secondary | ICD-10-CM | POA: Diagnosis not present

## 2022-12-02 DIAGNOSIS — M25661 Stiffness of right knee, not elsewhere classified: Secondary | ICD-10-CM | POA: Diagnosis not present

## 2022-12-06 DIAGNOSIS — M25661 Stiffness of right knee, not elsewhere classified: Secondary | ICD-10-CM | POA: Diagnosis not present

## 2022-12-06 DIAGNOSIS — Z96651 Presence of right artificial knee joint: Secondary | ICD-10-CM | POA: Diagnosis not present

## 2022-12-08 DIAGNOSIS — D485 Neoplasm of uncertain behavior of skin: Secondary | ICD-10-CM | POA: Diagnosis not present

## 2022-12-09 DIAGNOSIS — M25661 Stiffness of right knee, not elsewhere classified: Secondary | ICD-10-CM | POA: Diagnosis not present

## 2022-12-09 DIAGNOSIS — Z96651 Presence of right artificial knee joint: Secondary | ICD-10-CM | POA: Diagnosis not present

## 2022-12-13 DIAGNOSIS — M25661 Stiffness of right knee, not elsewhere classified: Secondary | ICD-10-CM | POA: Diagnosis not present

## 2022-12-13 DIAGNOSIS — Z96651 Presence of right artificial knee joint: Secondary | ICD-10-CM | POA: Diagnosis not present

## 2022-12-16 DIAGNOSIS — Z96651 Presence of right artificial knee joint: Secondary | ICD-10-CM | POA: Diagnosis not present

## 2022-12-16 DIAGNOSIS — M25661 Stiffness of right knee, not elsewhere classified: Secondary | ICD-10-CM | POA: Diagnosis not present

## 2022-12-20 DIAGNOSIS — Z23 Encounter for immunization: Secondary | ICD-10-CM | POA: Diagnosis not present

## 2022-12-22 DIAGNOSIS — D225 Melanocytic nevi of trunk: Secondary | ICD-10-CM | POA: Diagnosis not present

## 2022-12-22 DIAGNOSIS — D485 Neoplasm of uncertain behavior of skin: Secondary | ICD-10-CM | POA: Diagnosis not present

## 2022-12-22 DIAGNOSIS — L905 Scar conditions and fibrosis of skin: Secondary | ICD-10-CM | POA: Diagnosis not present

## 2022-12-28 ENCOUNTER — Ambulatory Visit: Payer: Medicare Other | Attending: Cardiology

## 2022-12-28 DIAGNOSIS — Z5181 Encounter for therapeutic drug level monitoring: Secondary | ICD-10-CM | POA: Insufficient documentation

## 2022-12-28 DIAGNOSIS — I480 Paroxysmal atrial fibrillation: Secondary | ICD-10-CM | POA: Diagnosis not present

## 2022-12-28 LAB — POCT INR: INR: 2.8 (ref 2.0–3.0)

## 2022-12-28 NOTE — Patient Instructions (Signed)
Continue taking warfarin 1/2 tablet daily except 1 tablet on Mondays and Fridays. Recheck 6 weeks. Call with any new medications, any new procedures, and with any concerns  [phone] or [phone] or [phone] Procedure Fax [phone] or [phone]

## 2022-12-29 DIAGNOSIS — M1711 Unilateral primary osteoarthritis, right knee: Secondary | ICD-10-CM | POA: Diagnosis not present

## 2023-01-25 DIAGNOSIS — L821 Other seborrheic keratosis: Secondary | ICD-10-CM | POA: Diagnosis not present

## 2023-01-25 DIAGNOSIS — L570 Actinic keratosis: Secondary | ICD-10-CM | POA: Diagnosis not present

## 2023-01-25 DIAGNOSIS — Z09 Encounter for follow-up examination after completed treatment for conditions other than malignant neoplasm: Secondary | ICD-10-CM | POA: Diagnosis not present

## 2023-01-25 DIAGNOSIS — L814 Other melanin hyperpigmentation: Secondary | ICD-10-CM | POA: Diagnosis not present

## 2023-01-25 DIAGNOSIS — Z08 Encounter for follow-up examination after completed treatment for malignant neoplasm: Secondary | ICD-10-CM | POA: Diagnosis not present

## 2023-01-25 DIAGNOSIS — D225 Melanocytic nevi of trunk: Secondary | ICD-10-CM | POA: Diagnosis not present

## 2023-01-25 DIAGNOSIS — Z85828 Personal history of other malignant neoplasm of skin: Secondary | ICD-10-CM | POA: Diagnosis not present

## 2023-01-25 DIAGNOSIS — Z872 Personal history of diseases of the skin and subcutaneous tissue: Secondary | ICD-10-CM | POA: Diagnosis not present

## 2023-02-08 ENCOUNTER — Ambulatory Visit: Payer: Medicare Other | Attending: Cardiology

## 2023-02-08 DIAGNOSIS — Z5181 Encounter for therapeutic drug level monitoring: Secondary | ICD-10-CM | POA: Insufficient documentation

## 2023-02-08 DIAGNOSIS — I480 Paroxysmal atrial fibrillation: Secondary | ICD-10-CM | POA: Diagnosis not present

## 2023-02-08 LAB — POCT INR: INR: 3 (ref 2.0–3.0)

## 2023-02-08 NOTE — Patient Instructions (Signed)
Continue taking warfarin 1/2 tablet daily except 1 tablet on Mondays and Fridays. Recheck 6 weeks. Call with any new medications, any new procedures, and with any concerns  [phone] or [phone] or [phone] Procedure Fax [phone] or [phone]

## 2023-02-09 DIAGNOSIS — I70203 Unspecified atherosclerosis of native arteries of extremities, bilateral legs: Secondary | ICD-10-CM | POA: Diagnosis not present

## 2023-02-09 DIAGNOSIS — I739 Peripheral vascular disease, unspecified: Secondary | ICD-10-CM | POA: Diagnosis not present

## 2023-02-09 DIAGNOSIS — M205X1 Other deformities of toe(s) (acquired), right foot: Secondary | ICD-10-CM | POA: Diagnosis not present

## 2023-02-09 DIAGNOSIS — B351 Tinea unguium: Secondary | ICD-10-CM | POA: Diagnosis not present

## 2023-02-09 DIAGNOSIS — M205X2 Other deformities of toe(s) (acquired), left foot: Secondary | ICD-10-CM | POA: Diagnosis not present

## 2023-02-09 DIAGNOSIS — L602 Onychogryphosis: Secondary | ICD-10-CM | POA: Diagnosis not present

## 2023-02-22 DIAGNOSIS — K219 Gastro-esophageal reflux disease without esophagitis: Secondary | ICD-10-CM | POA: Diagnosis not present

## 2023-02-22 DIAGNOSIS — E7800 Pure hypercholesterolemia, unspecified: Secondary | ICD-10-CM | POA: Diagnosis not present

## 2023-02-22 DIAGNOSIS — N1831 Chronic kidney disease, stage 3a: Secondary | ICD-10-CM | POA: Diagnosis not present

## 2023-02-22 DIAGNOSIS — I480 Paroxysmal atrial fibrillation: Secondary | ICD-10-CM | POA: Diagnosis not present

## 2023-02-22 DIAGNOSIS — M159 Polyosteoarthritis, unspecified: Secondary | ICD-10-CM | POA: Diagnosis not present

## 2023-02-22 DIAGNOSIS — M899 Disorder of bone, unspecified: Secondary | ICD-10-CM | POA: Diagnosis not present

## 2023-02-22 DIAGNOSIS — M19012 Primary osteoarthritis, left shoulder: Secondary | ICD-10-CM | POA: Diagnosis not present

## 2023-02-22 DIAGNOSIS — I5032 Chronic diastolic (congestive) heart failure: Secondary | ICD-10-CM | POA: Diagnosis not present

## 2023-02-22 DIAGNOSIS — I10 Essential (primary) hypertension: Secondary | ICD-10-CM | POA: Diagnosis not present

## 2023-02-22 DIAGNOSIS — R200 Anesthesia of skin: Secondary | ICD-10-CM | POA: Diagnosis not present

## 2023-02-22 DIAGNOSIS — D6869 Other thrombophilia: Secondary | ICD-10-CM | POA: Diagnosis not present

## 2023-02-26 ENCOUNTER — Inpatient Hospital Stay (HOSPITAL_COMMUNITY)
Admission: EM | Admit: 2023-02-26 | Discharge: 2023-03-03 | DRG: 481 | Disposition: A | Discharge status: Skilled Nursing Facility | Payer: Medicare Other | Attending: Family Medicine | Admitting: Family Medicine

## 2023-02-26 ENCOUNTER — Other Ambulatory Visit: Payer: Self-pay

## 2023-02-26 ENCOUNTER — Emergency Department (HOSPITAL_COMMUNITY): Payer: Medicare Other

## 2023-02-26 ENCOUNTER — Encounter (HOSPITAL_COMMUNITY): Payer: Self-pay

## 2023-02-26 DIAGNOSIS — Z471 Aftercare following joint replacement surgery: Secondary | ICD-10-CM | POA: Diagnosis not present

## 2023-02-26 DIAGNOSIS — S72331A Displaced oblique fracture of shaft of right femur, initial encounter for closed fracture: Secondary | ICD-10-CM | POA: Diagnosis not present

## 2023-02-26 DIAGNOSIS — K5900 Constipation, unspecified: Secondary | ICD-10-CM | POA: Diagnosis not present

## 2023-02-26 DIAGNOSIS — Z90710 Acquired absence of both cervix and uterus: Secondary | ICD-10-CM | POA: Diagnosis not present

## 2023-02-26 DIAGNOSIS — I480 Paroxysmal atrial fibrillation: Secondary | ICD-10-CM | POA: Diagnosis present

## 2023-02-26 DIAGNOSIS — W19XXXA Unspecified fall, initial encounter: Secondary | ICD-10-CM | POA: Diagnosis not present

## 2023-02-26 DIAGNOSIS — Z96643 Presence of artificial hip joint, bilateral: Secondary | ICD-10-CM | POA: Diagnosis present

## 2023-02-26 DIAGNOSIS — I110 Hypertensive heart disease with heart failure: Secondary | ICD-10-CM | POA: Diagnosis present

## 2023-02-26 DIAGNOSIS — M85861 Other specified disorders of bone density and structure, right lower leg: Secondary | ICD-10-CM | POA: Diagnosis not present

## 2023-02-26 DIAGNOSIS — M1711 Unilateral primary osteoarthritis, right knee: Secondary | ICD-10-CM | POA: Diagnosis not present

## 2023-02-26 DIAGNOSIS — I499 Cardiac arrhythmia, unspecified: Secondary | ICD-10-CM | POA: Diagnosis not present

## 2023-02-26 DIAGNOSIS — Z7901 Long term (current) use of anticoagulants: Secondary | ICD-10-CM

## 2023-02-26 DIAGNOSIS — Z91041 Radiographic dye allergy status: Secondary | ICD-10-CM

## 2023-02-26 DIAGNOSIS — M545 Low back pain, unspecified: Secondary | ICD-10-CM | POA: Diagnosis not present

## 2023-02-26 DIAGNOSIS — E785 Hyperlipidemia, unspecified: Secondary | ICD-10-CM | POA: Diagnosis present

## 2023-02-26 DIAGNOSIS — D696 Thrombocytopenia, unspecified: Secondary | ICD-10-CM | POA: Diagnosis not present

## 2023-02-26 DIAGNOSIS — Z8781 Personal history of (healed) traumatic fracture: Secondary | ICD-10-CM | POA: Diagnosis not present

## 2023-02-26 DIAGNOSIS — I5032 Chronic diastolic (congestive) heart failure: Secondary | ICD-10-CM | POA: Diagnosis present

## 2023-02-26 DIAGNOSIS — S72491D Other fracture of lower end of right femur, subsequent encounter for closed fracture with routine healing: Secondary | ICD-10-CM | POA: Diagnosis not present

## 2023-02-26 DIAGNOSIS — F32A Depression, unspecified: Secondary | ICD-10-CM | POA: Diagnosis present

## 2023-02-26 DIAGNOSIS — M25561 Pain in right knee: Secondary | ICD-10-CM | POA: Diagnosis not present

## 2023-02-26 DIAGNOSIS — R059 Cough, unspecified: Secondary | ICD-10-CM | POA: Diagnosis not present

## 2023-02-26 DIAGNOSIS — Z881 Allergy status to other antibiotic agents status: Secondary | ICD-10-CM | POA: Diagnosis not present

## 2023-02-26 DIAGNOSIS — I272 Pulmonary hypertension, unspecified: Secondary | ICD-10-CM | POA: Diagnosis present

## 2023-02-26 DIAGNOSIS — Z79899 Other long term (current) drug therapy: Secondary | ICD-10-CM | POA: Diagnosis not present

## 2023-02-26 DIAGNOSIS — M9711XA Periprosthetic fracture around internal prosthetic right knee joint, initial encounter: Secondary | ICD-10-CM | POA: Diagnosis present

## 2023-02-26 DIAGNOSIS — Z85828 Personal history of other malignant neoplasm of skin: Secondary | ICD-10-CM | POA: Diagnosis not present

## 2023-02-26 DIAGNOSIS — R6889 Other general symptoms and signs: Secondary | ICD-10-CM | POA: Diagnosis not present

## 2023-02-26 DIAGNOSIS — S7291XA Unspecified fracture of right femur, initial encounter for closed fracture: Secondary | ICD-10-CM | POA: Diagnosis not present

## 2023-02-26 DIAGNOSIS — R41841 Cognitive communication deficit: Secondary | ICD-10-CM | POA: Diagnosis not present

## 2023-02-26 DIAGNOSIS — F419 Anxiety disorder, unspecified: Secondary | ICD-10-CM | POA: Diagnosis not present

## 2023-02-26 DIAGNOSIS — M85851 Other specified disorders of bone density and structure, right thigh: Secondary | ICD-10-CM | POA: Diagnosis not present

## 2023-02-26 DIAGNOSIS — S72491A Other fracture of lower end of right femur, initial encounter for closed fracture: Secondary | ICD-10-CM | POA: Diagnosis not present

## 2023-02-26 DIAGNOSIS — B029 Zoster without complications: Secondary | ICD-10-CM | POA: Diagnosis not present

## 2023-02-26 DIAGNOSIS — I4891 Unspecified atrial fibrillation: Secondary | ICD-10-CM | POA: Diagnosis not present

## 2023-02-26 DIAGNOSIS — W010XXA Fall on same level from slipping, tripping and stumbling without subsequent striking against object, initial encounter: Secondary | ICD-10-CM | POA: Diagnosis present

## 2023-02-26 DIAGNOSIS — M25551 Pain in right hip: Secondary | ICD-10-CM | POA: Diagnosis not present

## 2023-02-26 DIAGNOSIS — M6281 Muscle weakness (generalized): Secondary | ICD-10-CM | POA: Diagnosis not present

## 2023-02-26 DIAGNOSIS — M79661 Pain in right lower leg: Secondary | ICD-10-CM | POA: Diagnosis not present

## 2023-02-26 DIAGNOSIS — G43919 Migraine, unspecified, intractable, without status migrainosus: Secondary | ICD-10-CM | POA: Diagnosis not present

## 2023-02-26 DIAGNOSIS — K219 Gastro-esophageal reflux disease without esophagitis: Secondary | ICD-10-CM | POA: Diagnosis present

## 2023-02-26 DIAGNOSIS — I10 Essential (primary) hypertension: Secondary | ICD-10-CM | POA: Diagnosis not present

## 2023-02-26 DIAGNOSIS — Z8701 Personal history of pneumonia (recurrent): Secondary | ICD-10-CM | POA: Diagnosis not present

## 2023-02-26 DIAGNOSIS — Z8249 Family history of ischemic heart disease and other diseases of the circulatory system: Secondary | ICD-10-CM

## 2023-02-26 DIAGNOSIS — Z743 Need for continuous supervision: Secondary | ICD-10-CM | POA: Diagnosis not present

## 2023-02-26 DIAGNOSIS — R531 Weakness: Secondary | ICD-10-CM | POA: Diagnosis not present

## 2023-02-26 DIAGNOSIS — Y92008 Other place in unspecified non-institutional (private) residence as the place of occurrence of the external cause: Secondary | ICD-10-CM

## 2023-02-26 DIAGNOSIS — Z96612 Presence of left artificial shoulder joint: Secondary | ICD-10-CM | POA: Diagnosis present

## 2023-02-26 DIAGNOSIS — S72301A Unspecified fracture of shaft of right femur, initial encounter for closed fracture: Secondary | ICD-10-CM | POA: Diagnosis not present

## 2023-02-26 DIAGNOSIS — M1990 Unspecified osteoarthritis, unspecified site: Secondary | ICD-10-CM | POA: Diagnosis not present

## 2023-02-26 DIAGNOSIS — G8929 Other chronic pain: Secondary | ICD-10-CM | POA: Diagnosis not present

## 2023-02-26 DIAGNOSIS — R2681 Unsteadiness on feet: Secondary | ICD-10-CM | POA: Diagnosis not present

## 2023-02-26 DIAGNOSIS — R262 Difficulty in walking, not elsewhere classified: Secondary | ICD-10-CM | POA: Diagnosis not present

## 2023-02-26 DIAGNOSIS — Z888 Allergy status to other drugs, medicaments and biological substances status: Secondary | ICD-10-CM | POA: Diagnosis not present

## 2023-02-26 DIAGNOSIS — Z885 Allergy status to narcotic agent status: Secondary | ICD-10-CM | POA: Diagnosis not present

## 2023-02-26 DIAGNOSIS — M9701XA Periprosthetic fracture around internal prosthetic right hip joint, initial encounter: Secondary | ICD-10-CM | POA: Diagnosis not present

## 2023-02-26 DIAGNOSIS — S7290XA Unspecified fracture of unspecified femur, initial encounter for closed fracture: Secondary | ICD-10-CM | POA: Diagnosis present

## 2023-02-26 DIAGNOSIS — Z9181 History of falling: Secondary | ICD-10-CM | POA: Diagnosis not present

## 2023-02-26 DIAGNOSIS — R2689 Other abnormalities of gait and mobility: Secondary | ICD-10-CM | POA: Diagnosis not present

## 2023-02-26 DIAGNOSIS — Z96611 Presence of right artificial shoulder joint: Secondary | ICD-10-CM | POA: Diagnosis not present

## 2023-02-26 DIAGNOSIS — S72401A Unspecified fracture of lower end of right femur, initial encounter for closed fracture: Secondary | ICD-10-CM | POA: Diagnosis not present

## 2023-02-26 DIAGNOSIS — Z7401 Bed confinement status: Secondary | ICD-10-CM | POA: Diagnosis not present

## 2023-02-26 DIAGNOSIS — D638 Anemia in other chronic diseases classified elsewhere: Secondary | ICD-10-CM | POA: Diagnosis not present

## 2023-02-26 LAB — COMPREHENSIVE METABOLIC PANEL
ALT: 19 U/L (ref 0–44)
AST: 26 U/L (ref 15–41)
Albumin: 4.1 g/dL (ref 3.5–5.0)
Alkaline Phosphatase: 82 U/L (ref 38–126)
Anion gap: 11 (ref 5–15)
BUN: 28 mg/dL — ABNORMAL HIGH (ref 8–23)
CO2: 22 mmol/L (ref 22–32)
Calcium: 8.7 mg/dL — ABNORMAL LOW (ref 8.9–10.3)
Chloride: 105 mmol/L (ref 98–111)
Creatinine, Ser: 0.96 mg/dL (ref 0.44–1.00)
GFR, Estimated: >60 mL/min (ref >60)
Glucose, Bld: 140 mg/dL — ABNORMAL HIGH (ref 70–99)
Potassium: 3.3 mmol/L — ABNORMAL LOW (ref 3.5–5.1)
Sodium: 138 mmol/L (ref 135–145)
Total Bilirubin: 0.9 mg/dL (ref 0.3–1.2)
Total Protein: 7.1 g/dL (ref 6.5–8.1)

## 2023-02-26 LAB — CBC WITH DIFFERENTIAL/PLATELET
Abs Immature Granulocytes: 0.02 10*3/uL (ref 0.00–0.07)
Basophils Absolute: 0 10*3/uL (ref 0.0–0.1)
Basophils Relative: 1 %
Eosinophils Absolute: 0 10*3/uL (ref 0.0–0.5)
Eosinophils Relative: 1 %
HCT: 34.5 % — ABNORMAL LOW (ref 36.0–46.0)
Hemoglobin: 11.5 g/dL — ABNORMAL LOW (ref 12.0–15.0)
Immature Granulocytes: 0 %
Lymphocytes Relative: 6 %
Lymphs Abs: 0.5 10*3/uL — ABNORMAL LOW (ref 0.7–4.0)
MCH: 31.6 pg (ref 26.0–34.0)
MCHC: 33.3 g/dL (ref 30.0–36.0)
MCV: 94.8 fL (ref 80.0–100.0)
Monocytes Absolute: 0.4 10*3/uL (ref 0.1–1.0)
Monocytes Relative: 5 %
Neutro Abs: 7.4 10*3/uL (ref 1.7–7.7)
Neutrophils Relative %: 87 %
Platelets: 205 10*3/uL (ref 150–400)
RBC: 3.64 MIL/uL — ABNORMAL LOW (ref 3.87–5.11)
RDW: 14.2 % (ref 11.5–15.5)
WBC: 8.4 10*3/uL (ref 4.0–10.5)
nRBC: 0 % (ref 0.0–0.2)

## 2023-02-26 LAB — PROTIME-INR
INR: 3 — ABNORMAL HIGH (ref 0.8–1.2)
Prothrombin Time: 31.3 seconds — ABNORMAL HIGH (ref 11.4–15.2)

## 2023-02-26 MED ORDER — ACETAMINOPHEN 325 MG PO TABS
650.0000 mg | ORAL_TABLET | Freq: Four times a day (QID) | ORAL | Status: DC
  Administered 2023-02-26 – 2023-03-03 (×20): 650 mg via ORAL
  Filled 2023-02-26 (×20): qty 2

## 2023-02-26 MED ORDER — HYDROMORPHONE HCL 1 MG/ML IJ SOLN
1.0000 mg | INTRAMUSCULAR | Status: DC | PRN
  Administered 2023-02-26 – 2023-02-27 (×3): 1 mg via INTRAVENOUS
  Filled 2023-02-26 (×3): qty 1

## 2023-02-26 MED ORDER — IRBESARTAN 150 MG PO TABS
150.0000 mg | ORAL_TABLET | Freq: Every day | ORAL | Status: DC
  Administered 2023-02-26 – 2023-03-03 (×6): 150 mg via ORAL
  Filled 2023-02-26 (×6): qty 1

## 2023-02-26 MED ORDER — PANTOPRAZOLE SODIUM 40 MG PO TBEC
40.0000 mg | DELAYED_RELEASE_TABLET | Freq: Every day | ORAL | Status: DC
  Administered 2023-02-26 – 2023-03-03 (×6): 40 mg via ORAL
  Filled 2023-02-26 (×6): qty 1

## 2023-02-26 MED ORDER — TORSEMIDE 20 MG PO TABS
20.0000 mg | ORAL_TABLET | Freq: Every day | ORAL | Status: DC
  Administered 2023-02-27 – 2023-03-03 (×5): 20 mg via ORAL
  Filled 2023-02-26 (×5): qty 1

## 2023-02-26 MED ORDER — PROSIGHT PO TABS
1.0000 | ORAL_TABLET | Freq: Every day | ORAL | Status: DC
  Administered 2023-02-27 – 2023-03-03 (×5): 1 via ORAL
  Filled 2023-02-26 (×5): qty 1

## 2023-02-26 MED ORDER — CITALOPRAM HYDROBROMIDE 20 MG PO TABS
20.0000 mg | ORAL_TABLET | Freq: Every day | ORAL | Status: DC
  Administered 2023-02-27 – 2023-03-03 (×5): 20 mg via ORAL
  Filled 2023-02-26 (×5): qty 1

## 2023-02-26 MED ORDER — POTASSIUM CHLORIDE CRYS ER 20 MEQ PO TBCR
40.0000 meq | EXTENDED_RELEASE_TABLET | Freq: Once | ORAL | Status: AC
  Administered 2023-02-26: 40 meq via ORAL
  Filled 2023-02-26: qty 2

## 2023-02-26 MED ORDER — VITAMIN D-3 125 MCG (5000 UT) PO TABS: 5000.0000 [IU] | ORAL_TABLET | ORAL | Status: DC

## 2023-02-26 MED ORDER — POLYETHYLENE GLYCOL 3350 17 G PO PACK
17.0000 g | PACK | Freq: Every day | ORAL | Status: DC
  Administered 2023-03-02: 17 g via ORAL
  Filled 2023-02-26 (×4): qty 1

## 2023-02-26 MED ORDER — ADULT MULTIVITAMIN W/MINERALS CH
1.0000 | ORAL_TABLET | Freq: Every day | ORAL | Status: DC
  Administered 2023-02-26 – 2023-03-03 (×6): 1 via ORAL
  Filled 2023-02-26 (×8): qty 1

## 2023-02-26 MED ORDER — HEPARIN SODIUM (PORCINE) 5000 UNIT/ML IJ SOLN: 5000.0000 [IU] | Freq: Three times a day (TID) | INTRAMUSCULAR | Status: DC

## 2023-02-26 MED ORDER — COLESTIPOL HCL 1 G PO TABS
5.0000 g | ORAL_TABLET | Freq: Two times a day (BID) | ORAL | Status: DC
  Administered 2023-02-26 – 2023-03-03 (×10): 5 g via ORAL
  Filled 2023-02-26 (×11): qty 5

## 2023-02-26 MED ORDER — COLESTIPOL HCL 5 G PO PACK
5.0000 g | PACK | Freq: Two times a day (BID) | ORAL | Status: DC
  Filled 2023-02-26: qty 1

## 2023-02-26 MED ORDER — VITAMIN D 25 MCG (1000 UNIT) PO TABS
5000.0000 [IU] | ORAL_TABLET | Freq: Every day | ORAL | Status: DC
  Administered 2023-02-27 – 2023-03-03 (×5): 5000 [IU] via ORAL
  Filled 2023-02-26 (×6): qty 5

## 2023-02-26 MED ORDER — FENTANYL CITRATE PF 50 MCG/ML IJ SOSY
50.0000 ug | PREFILLED_SYRINGE | Freq: Once | INTRAMUSCULAR | Status: AC
  Administered 2023-02-26: 50 ug via INTRAVENOUS
  Filled 2023-02-26: qty 1

## 2023-02-26 MED ORDER — METOPROLOL SUCCINATE ER 50 MG PO TB24
100.0000 mg | ORAL_TABLET | Freq: Two times a day (BID) | ORAL | Status: DC
  Administered 2023-02-26 – 2023-03-03 (×10): 100 mg via ORAL
  Filled 2023-02-26 (×3): qty 2
  Filled 2023-02-26 (×3): qty 4
  Filled 2023-02-26 (×3): qty 2
  Filled 2023-02-26: qty 4

## 2023-02-26 MED ORDER — OCUVITE-LUTEIN PO CAPS
1.0000 | ORAL_CAPSULE | Freq: Every day | ORAL | Status: DC
  Administered 2023-02-26: 1 via ORAL
  Filled 2023-02-26 (×2): qty 1

## 2023-02-26 MED ORDER — LORATADINE 10 MG PO TABS
10.0000 mg | ORAL_TABLET | Freq: Every day | ORAL | Status: DC
  Administered 2023-02-27 – 2023-03-03 (×5): 10 mg via ORAL
  Filled 2023-02-26 (×5): qty 1

## 2023-02-26 MED ORDER — HYDROMORPHONE HCL 1 MG/ML IJ SOLN
1.0000 mg | Freq: Once | INTRAMUSCULAR | Status: AC
  Administered 2023-02-26: 1 mg via INTRAVENOUS
  Filled 2023-02-26: qty 1

## 2023-02-26 MED ORDER — FENTANYL CITRATE PF 50 MCG/ML IJ SOSY
50.0000 ug | PREFILLED_SYRINGE | INTRAMUSCULAR | Status: DC | PRN
  Administered 2023-02-26 (×2): 50 ug via INTRAVENOUS
  Filled 2023-02-26 (×2): qty 1

## 2023-02-26 NOTE — Assessment & Plan Note (Addendum)
In setting of tripping over object in room. Oblique angulated fracture distal femur on right side.  Orthopedics consulted in the ED and plan for plate ORIF, likely Monday 9/9. Chales Abrahams 0.8%, ACS NSQIP below. -Admit to [facility], med telemetry, attending Dr. Pollie Meyer -Orthopedics consulted, appreciate recommendations -Pain control with Dilaudid 1 mg every 3, scheduled Tylenol 650 every 6 -DVT prophylaxis: hold home warfarin, start heparin VTE dosing pending orthopedic recommendations/interventions -PT/OT  -Regular diet for now, will need to be n.p.o. Monday morning 9/9 at 12 AM -Labs AM CBC, BMP

## 2023-02-26 NOTE — ED Triage Notes (Signed)
Patient bib [facility] from home after a fall. EMS states she tripped and fell landed on right side  She is complaining of Right knee pain that runs down the shin.  EMS gave fentanyl enroute. EMS reports No pain in hips or pelvis when palpated.   Patient presents to the ED in a immobilizer on the right leg and Alert and oriented.  Patient is on warfarin.

## 2023-02-26 NOTE — ED Notes (Signed)
Floor called and notified of pt's arrival.

## 2023-02-26 NOTE — ED Notes (Signed)
Awaiting patient from lobby 

## 2023-02-26 NOTE — ED Notes (Addendum)
ED TO INPATIENT HANDOFF REPORT  ED Nurse Name and Phone #: [place] RN [phone]  Pt is alert and oriented, pain currently controlled with IV medication. Pt being admitted for surgical repair of oblique angulated fracture of the distal femur with approximately 3/4 shaft widths posterior displacement of the distal fragment. VSS.   S Name/Age/Gender Sierra [name] 77 y.o. female Room/Bed: 008C/008C  Code Status   Code Status: Prior  Home/SNF/Other Home Patient oriented to: self, place, time, and situation Is this baseline? Yes   Triage Complete: Triage complete  Chief Complaint Femur fracture (HCC) [S72.90XA]  Triage Note Patient bib [facility] from home after a fall. EMS states she tripped and fell landed on right side  She is complaining of Right knee pain that runs down the shin.  EMS gave fentanyl enroute. EMS reports No pain in hips or pelvis when palpated.   Patient presents to the ED in a immobilizer on the right leg and Alert and oriented.  Patient is on warfarin.     Allergies Allergies  Allergen Reactions   Ace Inhibitors Cough   Codeine Nausea Only     GI Intolerance   Erythromycin Other (See Comments)    Stomach pain    Iohexol Hives     Code: HIVES, Desc: patient states develops hives w/ iv contrast/mms    Iodinated Contrast Media Other (See Comments)    Hives    Level of Care/Admitting Diagnosis ED Disposition     ED Disposition  Admit   Condition  --   Comment  Hospital Area: [facility] [[id number]]  Level of Care: Telemetry Medical [104]  May admit patient to [facility] or [facility] if equivalent level of care is available:: No  Covid Evaluation: Asymptomatic - no recent exposure (last 10 days) testing not required  Diagnosis: Femur fracture [facility]) [[id number]]  Admitting Physician: Evette Georges [[id number]]  Attending Physician: Latrelle Dodrill [phone]  Certification:: I certify this patient will need inpatient services for at  least 2 midnights  Expected Medical Readiness: 03/02/2023          B Medical/Surgery History Past Medical History:  Diagnosis Date   Anxiety    Arthritis    "joints" (09/19/2013)   Atrial fibrillation (HCC)    Basal cell carcinoma of cheek    left   CHF (congestive heart failure) (HCC)    Chronic lower back pain    Dysrhythmia    GERD (gastroesophageal reflux disease)    H/O hiatal hernia    Hypertension    Migraine    "haven't had any in a long time" (09/19/2013)   Pneumonia ~ 2012   Past Surgical History:  Procedure Laterality Date   BASAL CELL CARCINOMA EXCISION Left    "cheek"   CARDIOVERSION N/A 07/15/2017   Procedure: CARDIOVERSION;  Surgeon: Vesta Mixer, MD;  Location: [facility] ENDOSCOPY;  Service: Cardiovascular;  Laterality: N/A;   CHOLECYSTECTOMY  02/19/1989   COLONOSCOPY WITH PROPOFOL N/A 07/05/2016   Procedure: COLONOSCOPY WITH PROPOFOL;  Surgeon: Charolett Bumpers, MD;  Location: [facility] ENDOSCOPY;  Service: Endoscopy;  Laterality: N/A;   DILATION AND CURETTAGE OF UTERUS     HERNIA REPAIR  06/21/2009   umbilical   JOINT REPLACEMENT     MOHS SURGERY     OOPHORECTOMY Bilateral    REVERSE SHOULDER ARTHROPLASTY Left 03/11/2022   Procedure: REVERSE SHOULDER ARTHROPLASTY;  Surgeon: Jones Broom, MD;  Location: [facility] ORS;  Service: Orthopedics;  Laterality: Left;  REVISION TOTAL HIP ARTHROPLASTY Left 09/19/2013   SHOULDER ARTHROSCOPY W/ ROTATOR CUFF REPAIR Right    TEMPOROMANDIBULAR JOINT SURGERY     TMJ ARTHROPLASTY     TOTAL HIP ARTHROPLASTY Left 06/22/2007   TOTAL HIP ARTHROPLASTY Right 06/21/2009   TOTAL HIP REVISION Left 09/19/2013   Procedure: LEFT TOTAL HIP REVISION;  Surgeon: Nestor Lewandowsky, MD;  Location: [facility] OR;  Service: Orthopedics;  Laterality: Left;   TOTAL KNEE ARTHROPLASTY Right 09/21/2022   Procedure: RIGHT TOTAL KNEE ARTHROPLASTY;  Surgeon: Marcene Corning, MD;  Location: [facility] ORS;  Service: Orthopedics;  Laterality: Right;   TUBAL LIGATION     VAGINAL  HYSTERECTOMY  02/19/1989     A IV Location/Drains/Wounds Patient Lines/Drains/Airways Status     Active Line/Drains/Airways     Name Placement date Placement time Site Days   Peripheral IV 02/26/23 20 G Left Antecubital 02/26/23  1440  Antecubital  less than 1            Intake/Output Last 24 hours No intake or output data in the 24 hours ending 02/26/23 1953  Labs/Imaging Results for orders placed or performed during the hospital encounter of 02/26/23 (from the past 48 hour(s))  CBC with Differential     Status: Abnormal   Collection Time: 02/26/23  3:28 PM  Result Value Ref Range   WBC 8.4 4.0 - 10.5 K/uL   RBC 3.64 (L) 3.87 - 5.11 MIL/uL   Hemoglobin 11.5 (L) 12.0 - 15.0 g/dL   HCT 21.3 (L) 08.6 - 57.8 %   MCV 94.8 80.0 - 100.0 fL   MCH 31.6 26.0 - 34.0 pg   MCHC 33.3 30.0 - 36.0 g/dL   RDW 46.9 62.9 - 52.8 %   Platelets 205 150 - 400 K/uL   nRBC 0.0 0.0 - 0.2 %   Neutrophils Relative % 87 %   Neutro Abs 7.4 1.7 - 7.7 K/uL   Lymphocytes Relative 6 %   Lymphs Abs 0.5 (L) 0.7 - 4.0 K/uL   Monocytes Relative 5 %   Monocytes Absolute 0.4 0.1 - 1.0 K/uL   Eosinophils Relative 1 %   Eosinophils Absolute 0.0 0.0 - 0.5 K/uL   Basophils Relative 1 %   Basophils Absolute 0.0 0.0 - 0.1 K/uL   Immature Granulocytes 0 %   Abs Immature Granulocytes 0.02 0.00 - 0.07 K/uL    Comment: Performed at [facility], [address] [place], [place] [address]  Comprehensive metabolic panel     Status: Abnormal   Collection Time: 02/26/23  3:28 PM  Result Value Ref Range   Sodium 138 135 - 145 mmol/L   Potassium 3.3 (L) 3.5 - 5.1 mmol/L   Chloride 105 98 - 111 mmol/L   CO2 22 22 - 32 mmol/L   Glucose, Bld 140 (H) 70 - 99 mg/dL    Comment: Glucose reference range applies only to samples taken after fasting for at least 8 hours.   BUN 28 (H) 8 - 23 mg/dL   Creatinine, Ser 4.01 0.44 - 1.00 mg/dL   Calcium 8.7 (L) 8.9 - 10.3 mg/dL   Total Protein 7.1 6.5 - 8.1 g/dL    Albumin 4.1 3.5 - 5.0 g/dL   AST 26 15 - 41 U/L   ALT 19 0 - 44 U/L   Alkaline Phosphatase 82 38 - 126 U/L   Total Bilirubin 0.9 0.3 - 1.2 mg/dL   GFR, Estimated >02 >72 mL/min    Comment: (NOTE) Calculated using  the CKD-EPI Creatinine Equation (2021)    Anion gap 11 5 - 15    Comment: Performed at [facility], [address], [place], [place] [address]  Protime-INR     Status: Abnormal   Collection Time: 02/26/23  3:28 PM  Result Value Ref Range   Prothrombin Time 31.3 (H) 11.4 - 15.2 seconds   INR 3.0 (H) 0.8 - 1.2    Comment: (NOTE) INR goal varies based on device and disease states. Performed at [facility], [address], [place], [place] [address]    DG Femur Min 2 Views Right  Result Date: 02/26/2023 CLINICAL DATA:  [id number] Fall 190176; fall EXAM: RIGHT KNEE - 1-2 VIEW; RIGHT FEMUR 2 VIEWS; RIGHT TIBIA AND FIBULA - 2 VIEW; PORTABLE PELVIS 1-2 VIEWS COMPARISON:  None Available. FINDINGS: Osteopenia. There is an oblique fracture of the distal femur with approximately 3/4 shaft widths posterior displacement of the distal fragment. There is apex medial angulation. Status post total knee arthroplasty. Orthopedic hardware is intact and without periprosthetic fracture or lucency. Status post RIGHT hip arthroplasty. Orthopedic hardware is intact and without periprosthetic fracture or lucency. Sacrum is obscured by overlapping bowel contents. No definitive pelvic fracture is identified. Status post LEFT hip arthroplasty. Degenerative changes of the lower lumbar spine. Enthesopathic changes of the plantar calcaneus and Achilles tendon. Mild midfoot degenerative changes. No additional fractures identified IMPRESSION: 1. There is an oblique angulated fracture of the distal femur with approximately 3/4 shaft widths posterior displacement of the distal fragment. 2. No definitive pelvic fracture is identified. If there is a persistent clinical concern for nondisplaced pelvic fracture,  recommend dedicated pelvic CT or MRI. Electronically Signed   By: Meda Klinefelter M.D.   On: 02/26/2023 16:56   DG Tibia/Fibula Right  Result Date: 02/26/2023 CLINICAL DATA:  [id number] Fall 190176; fall EXAM: RIGHT KNEE - 1-2 VIEW; RIGHT FEMUR 2 VIEWS; RIGHT TIBIA AND FIBULA - 2 VIEW; PORTABLE PELVIS 1-2 VIEWS COMPARISON:  None Available. FINDINGS: Osteopenia. There is an oblique fracture of the distal femur with approximately 3/4 shaft widths posterior displacement of the distal fragment. There is apex medial angulation. Status post total knee arthroplasty. Orthopedic hardware is intact and without periprosthetic fracture or lucency. Status post RIGHT hip arthroplasty. Orthopedic hardware is intact and without periprosthetic fracture or lucency. Sacrum is obscured by overlapping bowel contents. No definitive pelvic fracture is identified. Status post LEFT hip arthroplasty. Degenerative changes of the lower lumbar spine. Enthesopathic changes of the plantar calcaneus and Achilles tendon. Mild midfoot degenerative changes. No additional fractures identified IMPRESSION: 1. There is an oblique angulated fracture of the distal femur with approximately 3/4 shaft widths posterior displacement of the distal fragment. 2. No definitive pelvic fracture is identified. If there is a persistent clinical concern for nondisplaced pelvic fracture, recommend dedicated pelvic CT or MRI. Electronically Signed   By: Meda Klinefelter M.D.   On: 02/26/2023 16:56   DG Pelvis Portable  Result Date: 02/26/2023 CLINICAL DATA:  190176 Fall 190176; fall EXAM: RIGHT KNEE - 1-2 VIEW; RIGHT FEMUR 2 VIEWS; RIGHT TIBIA AND FIBULA - 2 VIEW; PORTABLE PELVIS 1-2 VIEWS COMPARISON:  None Available. FINDINGS: Osteopenia. There is an oblique fracture of the distal femur with approximately 3/4 shaft widths posterior displacement of the distal fragment. There is apex medial angulation. Status post total knee arthroplasty. Orthopedic hardware is  intact and without periprosthetic fracture or lucency. Status post RIGHT hip arthroplasty. Orthopedic hardware is intact and without periprosthetic  fracture or lucency. Sacrum is obscured by overlapping bowel contents. No definitive pelvic fracture is identified. Status post LEFT hip arthroplasty. Degenerative changes of the lower lumbar spine. Enthesopathic changes of the plantar calcaneus and Achilles tendon. Mild midfoot degenerative changes. No additional fractures identified IMPRESSION: 1. There is an oblique angulated fracture of the distal femur with approximately 3/4 shaft widths posterior displacement of the distal fragment. 2. No definitive pelvic fracture is identified. If there is a persistent clinical concern for nondisplaced pelvic fracture, recommend dedicated pelvic CT or MRI. Electronically Signed   By: Meda Klinefelter M.D.   On: 02/26/2023 16:56   DG Knee 1-2 Views Right  Result Date: 02/26/2023 CLINICAL DATA:  [id number] Fall 190176; fall EXAM: RIGHT KNEE - 1-2 VIEW; RIGHT FEMUR 2 VIEWS; RIGHT TIBIA AND FIBULA - 2 VIEW; PORTABLE PELVIS 1-2 VIEWS COMPARISON:  None Available. FINDINGS: Osteopenia. There is an oblique fracture of the distal femur with approximately 3/4 shaft widths posterior displacement of the distal fragment. There is apex medial angulation. Status post total knee arthroplasty. Orthopedic hardware is intact and without periprosthetic fracture or lucency. Status post RIGHT hip arthroplasty. Orthopedic hardware is intact and without periprosthetic fracture or lucency. Sacrum is obscured by overlapping bowel contents. No definitive pelvic fracture is identified. Status post LEFT hip arthroplasty. Degenerative changes of the lower lumbar spine. Enthesopathic changes of the plantar calcaneus and Achilles tendon. Mild midfoot degenerative changes. No additional fractures identified IMPRESSION: 1. There is an oblique angulated fracture of the distal femur with approximately 3/4 shaft  widths posterior displacement of the distal fragment. 2. No definitive pelvic fracture is identified. If there is a persistent clinical concern for nondisplaced pelvic fracture, recommend dedicated pelvic CT or MRI. Electronically Signed   By: Meda Klinefelter M.D.   On: 02/26/2023 16:56    Pending Labs Unresulted Labs (From admission, onward)     Start     Ordered   02/27/23 0500  Basic metabolic panel  Tomorrow morning,   R        02/26/23 1951   02/27/23 0500  CBC with Differential/Platelet  Tomorrow morning,   R        02/26/23 1951   02/27/23 0500  Hemoglobin A1c  Tomorrow morning,   R        02/26/23 1951            Vitals/Pain Today's Vitals   02/26/23 1800 02/26/23 1845 02/26/23 1930 02/26/23 1944  BP:  (!) 125/59 (!) 144/66   Pulse: 96 94 89   Resp: 12 18 12    Temp:      TempSrc:      SpO2: 99% 93% 92%   Weight:      Height:      PainSc:    4     Isolation Precautions No active isolations  Medications Medications  fentaNYL (SUBLIMAZE) injection 50 mcg (50 mcg Intravenous Given 02/26/23 1526)  potassium chloride SA (KLOR-CON M) CR tablet 40 mEq (has no administration in time range)  fentaNYL (SUBLIMAZE) injection 50 mcg (50 mcg Intravenous Given 02/26/23 1442)  HYDROmorphone (DILAUDID) injection 1 mg (1 mg Intravenous Given 02/26/23 1840)    Mobility Walks at baseline, bed bound at this time due to right femur fx     Focused Assessments   R Recommendations: See Admitting Provider Note  Report given to:   Additional Notes: See top of SBAR

## 2023-02-26 NOTE — Assessment & Plan Note (Deleted)
In setting of tripping over object in room. Oblique angulated fracture distal femur on right side.  Orthopedics consulted in the ED and plan for plate ORIF. Sierra [name] 0.8%, ACS NSQIP below. -Admit to FMTS, med telemetry, attending Dr. Pollie Meyer -Orthopedics consulted, appreciate recommendations -Pain control -DVT prophylaxis: hold home warfarin, start heparin VTE dosing pending orthopedic recommendations/interventions -PT/OT  -NPO after MN for possible intervention -Labs AM CBC, BMP

## 2023-02-26 NOTE — H&P (Cosign Needed Addendum)
Hospital Admission History and Physical Service Pager: [phone]  Patient name: PAETON BAYES Medical record number: [id number] Date of Birth: 1945/08/01 Age: 77 y.o. Gender: female  Primary Care Provider: Emilio Aspen, MD Consultants: Orthopedics Code Status: FULL Preferred Emergency Contact:  Contact Information     Name Relation Home Work Mobile   [place], [place] [phone]  [phone]      Other Contacts   None on File     Chief Complaint: Fall  Assessment and Plan: SHELLYE KLOEPFER is a 77 y.o. female presenting with femur fracture after a fall.  Fall most likely precipitated by mis-stepping/deconditioning.  Less likely cardiac dysrhythmia, intracranial process/acute stroke, orthostasis, metabolic encephalopathy, medication/substance use given history and exam.  Reassured by overall stable status in ED.  Will admit for medical management in anticipation for ORIF with orthopedics as below.  Assessment & Plan Femur fracture (HCC) In setting of tripping over object in room. Oblique angulated fracture distal femur on right side.  Orthopedics consulted in the ED and plan for plate ORIF, likely Monday 9/9. Chales Abrahams 0.8%, ACS NSQIP below. -Admit to FMTS, med telemetry, attending Dr. Pollie Meyer -Orthopedics consulted, appreciate recommendations -Pain control with Dilaudid 1 mg every 3, scheduled Tylenol 650 every 6 -DVT prophylaxis: hold home warfarin, start heparin VTE dosing pending orthopedic recommendations/interventions -PT/OT  -Regular diet for now, will need to be n.p.o. Monday morning 9/9 at 12 AM -Labs AM CBC, BMP     Chronic and Stable Conditions: Atrial fibrillation: Rate controlled, continue home metoprolol 100 BID; hold coumadin 5 mg daily Hypertension: Mildly elevated, likely due to pain, continue formulary equivalent for home valsartan and torsemide Chronic diastolic heart failure: Last Echo in 2018 EF 55-60%, continue home meds as  above Depression: Continue home Celexa Allergies: Continue formula equivalent for home Allegra GERD: Continue formulary equivalent for home rabeprazole  FEN/GI: Regular diet, miralax 17 g daily VTE Prophylaxis: Holding warfarin, no heparin due to INR 3 (can initiate if <2 per pharmacy)  Disposition: Med/tele  History of Present Illness:  LILIEN MARKSBERRY is a 77 y.o. female presenting with displaced right femur fracture.  States she was in her junk room and she had to squeeze in through some stuff. She graxed over the shower chair legs . After hitting it feel onto the carpet. Tried to grab chair but it swung around and fell. She said her leg looked twisted when she saw it. Husband was in the kitchen and his hearing aids wern't working. She was laying there for 10 minutes on the ground. No head hit. No LOC. No dizziness or palpitations prior to fall.  She has had many falls before this --- tripped on curb and had 2 falls in shower.   Patient was walking in a room full of junk today, to see her leg and falling awkwardly on her right knee with her leg bending inward.  She has been unable to bear weight on the leg.  EMS was called who administered 200 mcg of fentanyl en route.  She denies hitting her head or losing consciousness.  She states she has been on warfarin for A-fib.  In the ED, patient was found to have GCS 15 with intact ABC.  Vital stable.  X-rays of the right femur, tib-fib, and knee with posterior displaced distal femur fracture of the right.  Labs notable for INR 3, K3.3, hemoglobin 11.5.  She was given fentanyl and Dilaudid for pain control.  Orthopedics was consulted.  Will admit for medical comorbidities in meantime.  Review Of Systems: Per HPI  Pertinent Past Medical History: OA Back pain Klebsiella pneumonia bacteremia with sepsis Chronic diastolic heart failure Depression Hypertension Hyperlipidemia Paroxysmal A-fib status post cardioversion 2019 Hiatal hernia Basal  cell carcinoma cheek Remainder reviewed in history tab.   Pertinent Past Surgical History: Reverse total shoulder arthroplasty of the left shoulder Cholecystectomy Basal cell carcinoma excision Hernia repair Hysterectomy+Oophorectomy Joint replacement Reverse total hip arthroplasty of the left and revision TMJ surgery Total hip arthroplasty of the right Total knee arthroplasty of the right Remainder reviewed in history tab.   Pertinent Social History: Tobacco use: Never Alcohol use: rarely-socially Other Substance use: None Lives with husband-married 65 years One story home  Uses walker in morning and cane when going out  Pertinent Family History: Heart attack in mother, maternal aunt, maternal uncle Remainder reviewed in history tab.   Important Outpatient Medications: Tylenol 1300 twice daily Vitamin D3 5000 every Monday Wednesday Friday Celexa 20 mg daily CoQ 10 100 mg nightly Colestipol 5 g twice daily Allegra 180 nightly Magnesium 250 Monday Wednesday Friday Metoprolol succinate 100 mg twice daily Multivitamin Potassium 10 mEq nightly Rabeprazole 20 mg daily Torsemide 20 mg daily AM Valsartan 160 mg nightly Warfarin 5 mg Monday and Friday 2.5 the rest of the week AREDs for eyes Remainder reviewed in medication history.   Objective: BP 124/80   Pulse 91   Temp 98.3 F (36.8 C)   Resp 17   Ht 5\' 5"  (1.651 m)   Wt 81.6 kg   SpO2 95%   BMI 29.95 kg/m  Exam: General: Lying in bed, no acute distress, conversant HEENT: Extraocular movements intact, NCAT Cardiovascular: Regular rate and rhythm without murmurs rubs or gallops, palpable right dorsalis pedis pulse Respiratory: Clear to auscultation bilaterally anterolaterally without wheezes rales or rhonchi appreciated Gastrointestinal: Soft, mildly tender to deep palpation of left lower quadrant, nondistended, normoactive bowel sounds MSK: Right lower extremity resting and eversion with noticeable swelling  along left thigh without overlying erythema Neuro: Alert and oriented Psych: Appropriate mood and affect  Labs:  CBC BMET  Recent Labs  Lab 02/26/23 1528  WBC 8.4  HGB 11.5*  HCT 34.5*  PLT 205   Recent Labs  Lab 02/26/23 1528  NA 138  K 3.3*  CL 105  CO2 22  BUN 28*  CREATININE 0.96  GLUCOSE 140*  CALCIUM 8.7*     INR 3  EKG: My own interpretation (not copied from electronic read): Pending   Imaging Studies Performed:  X-ray right knee, right femur, right tibia and fibula, pelvis IMPRESSION: 1. There is an oblique angulated fracture of the distal femur with approximately 3/4 shaft widths posterior displacement of the distal fragment. 2. No definitive pelvic fracture is identified. If there is a persistent clinical concern for nondisplaced pelvic fracture, recommend dedicated pelvic CT or MRI.  My Interpretation: Agree with above   Evette Georges, MD 02/26/2023, 9:29 PM PGY-2, [facility], [facility]  FPTS Intern pager: [phone], text pages welcome Secure chat group [facility] Teaching Service

## 2023-02-26 NOTE — ED Provider Notes (Addendum)
[facility] AT [facility] Provider Note   CSN: [id number] Arrival date & time: 02/26/23  1414     History  Chief Complaint  Patient presents with   Fall   Leg Injury    Sierra [name] is a 77 y.o. female.   Fall     77 year old female presenting to the emergency ferment as a nonlevel trauma after a fall.  The patient states that she tripped in a room full of junk and twisted her leg during the fall and landed awkwardly on her right knee with her leg bending inward.  She says since been unable to bear weight on the right leg.  EMS administered 200 mcg of fentanyl and route.  She denies any head trauma or loss of consciousness.  She arrives in a knee immobilizer.  She is on warfarin.  She arrives GCS 15, ABC intact.  She arrives with an intact DP pulse in the right lower extremity.  Home Medications Prior to Admission medications   Medication Sig Start Date End Date Taking? Authorizing Provider  acetaminophen (TYLENOL) 650 MG CR tablet Take 1,300 mg by mouth 2 (two) times daily. 07/16/20  Yes [provider]  Cholecalciferol (VITAMIN D-3) 5000 UNITS TABS Take 5,000 Units by mouth every Monday, Wednesday, and Friday.    Yes [provider]  citalopram (CELEXA) 20 MG tablet Take 20 mg by mouth daily. 07/31/21  Yes [provider]  Coenzyme Q10 (CO Q-10) 100 MG CAPS Take 100 mg by mouth at bedtime.   Yes [provider]  colestipol (COLESTID) 5 g packet Take 5 g by mouth 2 (two) times daily.  01/06/17  Yes [provider]  fexofenadine (ALLEGRA) 180 MG tablet Take 180 mg by mouth at bedtime.   Yes [provider]  Magnesium 250 MG TABS Take 250 mg by mouth every Monday, Wednesday, and Friday.   Yes [provider]  Menthol, Topical Analgesic, (BIOFREEZE) 4 % GEL Apply 1 application. topically daily as needed (pain).   Yes [provider]  metoprolol succinate (TOPROL-XL) 50 MG 24 hr tablet  TAKE 2 TABLETS BY MOUTH 2 TIMES DAILY. 09/22/22  Yes Corky Crafts, MD  Multiple Vitamins-Minerals (MEMORY VITE PO) Take 3 tablets by mouth daily. Memory plus   Yes [provider]  Multiple Vitamins-Minerals (MULTIVITAMIN WITH MINERALS) tablet Take 1 tablet by mouth daily. 50+ for her   Yes [provider]  Multiple Vitamins-Minerals (PRESERVISION AREDS 2) CAPS Take 1 capsule by mouth in the morning and at bedtime.   Yes [provider]  potassium chloride (KLOR-CON) 10 MEQ tablet Take 10 mEq by mouth at bedtime.   Yes [provider]  RABEprazole (ACIPHEX) 20 MG tablet Take 20 mg by mouth daily.    Yes [provider]  torsemide (DEMADEX) 20 MG tablet Take 20 mg by mouth daily.   Yes [provider]  valsartan (DIOVAN) 160 MG tablet Take 160 mg by mouth at bedtime. 12/18/21  Yes [provider]  warfarin (COUMADIN) 5 MG tablet TAKE AS DIRECTED BY COUMADIN CLINIC 09/22/22  Yes Corky Crafts, MD      Allergies    Ace inhibitors, Codeine, Erythromycin, Iohexol, and Iodinated contrast media    Review of Systems   Review of Systems  Musculoskeletal:  Positive for arthralgias.  All other systems reviewed and are negative.   Physical Exam Updated Vital Signs BP (!) 125/59   Pulse 94  Temp 97.9 F (36.6 C) (Oral)   Resp 18   Ht 5\' 5"  (1.651 m)   Wt 81.6 kg   SpO2 93%   BMI 29.95 kg/m  Physical Exam Vitals and nursing note reviewed.  Constitutional:      General: She is not in acute distress.    Appearance: She is well-developed.     Comments: GCS 15, ABC intact  HENT:     Head: Normocephalic and atraumatic.  Eyes:     Extraocular Movements: Extraocular movements intact.     Conjunctiva/sclera: Conjunctivae normal.     Pupils: Pupils are equal, round, and reactive to light.  Neck:     Comments: No midline tenderness to palpation of the cervical spine.  Range of motion intact Cardiovascular:     Rate and  Rhythm: Normal rate and regular rhythm.  Pulmonary:     Effort: Pulmonary effort is normal. No respiratory distress.     Breath sounds: Normal breath sounds.  Chest:     Comments: Clavicles stable nontender to AP compression.  Chest wall stable and nontender to AP and lateral compression. Abdominal:     Palpations: Abdomen is soft.     Tenderness: There is no abdominal tenderness.     Comments: Pelvis stable to lateral compression  Musculoskeletal:     Cervical back: Neck supple.     Comments: No midline tenderness to palpation of the thoracic or lumbar spine.  Extremities atraumatic with intact range of motion with the exception of the right lower extremity which appear shortened compared to the left lower extremity, 1+ DP pulses, decreased motor function due to pain, tenderness to palpation about the distal femur, about the knee and proximal tibia.  Skin:    General: Skin is warm and dry.  Neurological:     Mental Status: She is alert.     Comments: Cranial nerves II through XII grossly intact.  Moving all 4 extremities spontaneously.  Sensation grossly intact all 4 extremities     ED Results / Procedures / Treatments   Labs (all labs ordered are listed, but only abnormal results are displayed) Labs Reviewed  CBC WITH DIFFERENTIAL/PLATELET - Abnormal; Notable for the following components:      Result Value   RBC 3.64 (*)    Hemoglobin 11.5 (*)    HCT 34.5 (*)    Lymphs Abs 0.5 (*)    All other components within normal limits  COMPREHENSIVE METABOLIC PANEL - Abnormal; Notable for the following components:   Potassium 3.3 (*)    Glucose, Bld 140 (*)    BUN 28 (*)    Calcium 8.7 (*)    All other components within normal limits  PROTIME-INR - Abnormal; Notable for the following components:   Prothrombin Time 31.3 (*)    INR 3.0 (*)    All other components within normal limits    EKG None  Radiology DG Femur Min 2 Views Right  Result Date: 02/26/2023 CLINICAL DATA:   190176 Fall 190176; fall EXAM: RIGHT KNEE - 1-2 VIEW; RIGHT FEMUR 2 VIEWS; RIGHT TIBIA AND FIBULA - 2 VIEW; PORTABLE PELVIS 1-2 VIEWS COMPARISON:  None Available. FINDINGS: Osteopenia. There is an oblique fracture of the distal femur with approximately 3/4 shaft widths posterior displacement of the distal fragment. There is apex medial angulation. Status post total knee arthroplasty. Orthopedic hardware is intact and without periprosthetic fracture or lucency. Status post RIGHT hip arthroplasty. Orthopedic hardware is intact and without periprosthetic fracture or lucency. Sacrum  is obscured by overlapping bowel contents. No definitive pelvic fracture is identified. Status post LEFT hip arthroplasty. Degenerative changes of the lower lumbar spine. Enthesopathic changes of the plantar calcaneus and Achilles tendon. Mild midfoot degenerative changes. No additional fractures identified IMPRESSION: 1. There is an oblique angulated fracture of the distal femur with approximately 3/4 shaft widths posterior displacement of the distal fragment. 2. No definitive pelvic fracture is identified. If there is a persistent clinical concern for nondisplaced pelvic fracture, recommend dedicated pelvic CT or MRI. Electronically Signed   By: Meda Klinefelter M.D.   On: 02/26/2023 16:56   DG Tibia/Fibula Right  Result Date: 02/26/2023 CLINICAL DATA:  190176 Fall 190176; fall EXAM: RIGHT KNEE - 1-2 VIEW; RIGHT FEMUR 2 VIEWS; RIGHT TIBIA AND FIBULA - 2 VIEW; PORTABLE PELVIS 1-2 VIEWS COMPARISON:  None Available. FINDINGS: Osteopenia. There is an oblique fracture of the distal femur with approximately 3/4 shaft widths posterior displacement of the distal fragment. There is apex medial angulation. Status post total knee arthroplasty. Orthopedic hardware is intact and without periprosthetic fracture or lucency. Status post RIGHT hip arthroplasty. Orthopedic hardware is intact and without periprosthetic fracture or lucency. Sacrum is  obscured by overlapping bowel contents. No definitive pelvic fracture is identified. Status post LEFT hip arthroplasty. Degenerative changes of the lower lumbar spine. Enthesopathic changes of the plantar calcaneus and Achilles tendon. Mild midfoot degenerative changes. No additional fractures identified IMPRESSION: 1. There is an oblique angulated fracture of the distal femur with approximately 3/4 shaft widths posterior displacement of the distal fragment. 2. No definitive pelvic fracture is identified. If there is a persistent clinical concern for nondisplaced pelvic fracture, recommend dedicated pelvic CT or MRI. Electronically Signed   By: Meda Klinefelter M.D.   On: 02/26/2023 16:56   DG Pelvis Portable  Result Date: 02/26/2023 CLINICAL DATA:  190176 Fall 190176; fall EXAM: RIGHT KNEE - 1-2 VIEW; RIGHT FEMUR 2 VIEWS; RIGHT TIBIA AND FIBULA - 2 VIEW; PORTABLE PELVIS 1-2 VIEWS COMPARISON:  None Available. FINDINGS: Osteopenia. There is an oblique fracture of the distal femur with approximately 3/4 shaft widths posterior displacement of the distal fragment. There is apex medial angulation. Status post total knee arthroplasty. Orthopedic hardware is intact and without periprosthetic fracture or lucency. Status post RIGHT hip arthroplasty. Orthopedic hardware is intact and without periprosthetic fracture or lucency. Sacrum is obscured by overlapping bowel contents. No definitive pelvic fracture is identified. Status post LEFT hip arthroplasty. Degenerative changes of the lower lumbar spine. Enthesopathic changes of the plantar calcaneus and Achilles tendon. Mild midfoot degenerative changes. No additional fractures identified IMPRESSION: 1. There is an oblique angulated fracture of the distal femur with approximately 3/4 shaft widths posterior displacement of the distal fragment. 2. No definitive pelvic fracture is identified. If there is a persistent clinical concern for nondisplaced pelvic fracture,  recommend dedicated pelvic CT or MRI. Electronically Signed   By: Meda Klinefelter M.D.   On: 02/26/2023 16:56   DG Knee 1-2 Views Right  Result Date: 02/26/2023 CLINICAL DATA:  190176 Fall 190176; fall EXAM: RIGHT KNEE - 1-2 VIEW; RIGHT FEMUR 2 VIEWS; RIGHT TIBIA AND FIBULA - 2 VIEW; PORTABLE PELVIS 1-2 VIEWS COMPARISON:  None Available. FINDINGS: Osteopenia. There is an oblique fracture of the distal femur with approximately 3/4 shaft widths posterior displacement of the distal fragment. There is apex medial angulation. Status post total knee arthroplasty. Orthopedic hardware is intact and without periprosthetic fracture or lucency. Status post RIGHT hip arthroplasty. Orthopedic hardware is intact  and without periprosthetic fracture or lucency. Sacrum is obscured by overlapping bowel contents. No definitive pelvic fracture is identified. Status post LEFT hip arthroplasty. Degenerative changes of the lower lumbar spine. Enthesopathic changes of the plantar calcaneus and Achilles tendon. Mild midfoot degenerative changes. No additional fractures identified IMPRESSION: 1. There is an oblique angulated fracture of the distal femur with approximately 3/4 shaft widths posterior displacement of the distal fragment. 2. No definitive pelvic fracture is identified. If there is a persistent clinical concern for nondisplaced pelvic fracture, recommend dedicated pelvic CT or MRI. Electronically Signed   By: Meda Klinefelter M.D.   On: 02/26/2023 16:56    Procedures Procedures    Medications Ordered in ED Medications  fentaNYL (SUBLIMAZE) injection 50 mcg (50 mcg Intravenous Given 02/26/23 1526)  fentaNYL (SUBLIMAZE) injection 50 mcg (50 mcg Intravenous Given 02/26/23 1442)  HYDROmorphone (DILAUDID) injection 1 mg (1 mg Intravenous Given 02/26/23 1840)    ED Course/ Medical Decision Making/ A&P                                 Medical Decision Making Amount and/or Complexity of Data Reviewed Labs:  ordered. Radiology: ordered.  Risk Prescription drug management. Decision regarding hospitalization.    77 year old female presenting to the emergency ferment as a nonlevel trauma after a fall.  The patient states that she tripped in a room full of junk and twisted her leg during the fall and landed awkwardly on her right knee with her leg bending inward.  She says since been unable to bear weight on the right leg.  EMS administered 200 mcg of fentanyl and route.  She denies any head trauma or loss of consciousness.  She arrives in a knee immobilizer.  She is on warfarin.  She arrives GCS 15, ABC intact.  She arrives with an intact DP pulse in the right lower extremity.  On arrival, the patient was vitally stable.  Presenting with concern for fracture of the knee or femur, considered tib-fib fracture as well.  Considered proximal tibial plateau fracture.  She arrives neurovascularly intact.  No head trauma or loss of consciousness.  No neck trauma.  She is on warfarin for anticoagulation.  Screening labs obtained to include INR which was found to be 3.0.  CMP revealed hypokalemia to 3.3, CBC without a significant anemia with anemia to 11.5 which is close to the patient's baseline.  X-ray imaging of the femur, tib-fib and right knee revealed a distal femur fracture: X-ray imaging of the pelvis was without evidence of fracture. IMPRESSION:  1. There is an oblique angulated fracture of the distal femur with  approximately 3/4 shaft widths posterior displacement of the distal  fragment.  2. No definitive pelvic fracture is identified. If there is a  persistent clinical concern for nondisplaced pelvic fracture,  recommend dedicated pelvic CT or MRI.    The patient was administered fentanyl and Dilaudid for pain control.  She was informed of the diagnosis of fracture.  Orthopedics was consulted for further recommendations.  Ortho: Spoke with Dr. Jerl Santos of [facility] will see the patient  in consultation.  Medicine consulted for admission, family medicine admitting, Dr. Pollie Meyer attending.   Final Clinical Impression(s) / ED Diagnoses Final diagnoses:  Fall, initial encounter  Closed fracture of right femur, unspecified fracture morphology, unspecified portion of femur, initial encounter (HCC)    Rx / DC Orders ED Discharge Orders  None         Ernie Avena, MD 02/26/23 Aretha Parrot    Ernie Avena, MD 02/26/23 Ander Gaster    Ernie Avena, MD 02/26/23 2012

## 2023-02-26 NOTE — Consult Note (Signed)
Marcene Corning, MD   Elodia Florence, PA-C                                  [facility]                [address], [place], [place]  [address]   ORTHOPAEDIC CONSULTATION  Sierra [name]            MRN:  [id number] DOB/SEX:  1946/02/11/female     CHIEF COMPLAINT:  Painful right leg  HISTORY: Sierra [name] a 77 y.o. female with history of TKR and THR in past fell today at home and suffered femur fracture in between.    PAST MEDICAL HISTORY: Patient Active Problem List   Diagnosis Date Noted   Femur fracture (HCC) 02/26/2023   Primary osteoarthritis of right knee 09/21/2022   Primary localized osteoarthritis of right knee 09/21/2022   S/P reverse total shoulder arthroplasty, left 03/11/2022   Intractable pain 09/07/2021   Weakness 09/06/2021   Back pain 09/06/2021   Low back pain 09/06/2021   Shingles    Persistent atrial fibrillation (HCC)    Chronic diastolic heart failure (HCC) 05/10/2017   Bilateral leg edema 05/10/2017   Encounter for therapeutic drug monitoring 04/18/2017   Bacteremia due to Klebsiella pneumoniae 04/08/2017   Sepsis (HCC) 04/07/2017   Complicated urinary tract infection 04/07/2017   Depression 04/07/2017   HLD (hyperlipidemia) 04/07/2017   Arthritis    Essential hypertension 07/29/2015   PAC (premature atrial contraction) 07/29/2015   PAF (paroxysmal atrial fibrillation) (HCC) 06/11/2015   Left hip pain 09/19/2013   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   Past Medical History:  Diagnosis Date   Anxiety    Arthritis    "joints" (09/19/2013)   Atrial fibrillation (HCC)    Basal cell carcinoma of cheek    left   CHF (congestive heart failure) (HCC)    Chronic lower back pain    Dysrhythmia    GERD (gastroesophageal reflux disease)    H/O hiatal hernia    Hypertension    Migraine    "haven't had any in a long time" (09/19/2013)   Pneumonia ~ 2012   Past Surgical History:  Procedure Laterality Date   BASAL CELL CARCINOMA  EXCISION Left    "cheek"   CARDIOVERSION N/A 07/15/2017   Procedure: CARDIOVERSION;  Surgeon: Vesta Mixer, MD;  Location: [facility] ENDOSCOPY;  Service: Cardiovascular;  Laterality: N/A;   CHOLECYSTECTOMY  02/19/1989   COLONOSCOPY WITH PROPOFOL N/A 07/05/2016   Procedure: COLONOSCOPY WITH PROPOFOL;  Surgeon: Charolett Bumpers, MD;  Location: [facility] ENDOSCOPY;  Service: Endoscopy;  Laterality: N/A;   DILATION AND CURETTAGE OF UTERUS     HERNIA REPAIR  06/21/2009   umbilical   JOINT REPLACEMENT     MOHS SURGERY     OOPHORECTOMY Bilateral    REVERSE SHOULDER ARTHROPLASTY Left 03/11/2022   Procedure: REVERSE SHOULDER ARTHROPLASTY;  Surgeon: Jones Broom, MD;  Location: [facility] ORS;  Service: Orthopedics;  Laterality: Left;   REVISION TOTAL HIP ARTHROPLASTY Left 09/19/2013   SHOULDER ARTHROSCOPY W/ ROTATOR CUFF REPAIR Right    TEMPOROMANDIBULAR JOINT SURGERY     TMJ ARTHROPLASTY     TOTAL HIP ARTHROPLASTY Left 06/22/2007   TOTAL HIP ARTHROPLASTY Right 06/21/2009   TOTAL HIP REVISION Left 09/19/2013   Procedure: LEFT TOTAL HIP REVISION;  Surgeon: Nestor Lewandowsky, MD;  Location: [facility] OR;  Service:  Orthopedics;  Laterality: Left;   TOTAL KNEE ARTHROPLASTY Right 09/21/2022   Procedure: RIGHT TOTAL KNEE ARTHROPLASTY;  Surgeon: Marcene Corning, MD;  Location: [facility] ORS;  Service: Orthopedics;  Laterality: Right;   TUBAL LIGATION     VAGINAL HYSTERECTOMY  02/19/1989     MEDICATIONS:   Current Facility-Administered Medications:    fentaNYL (SUBLIMAZE) injection 50 mcg, 50 mcg, Intravenous, Q1H PRN, Ernie Avena, MD, 50 mcg at 02/26/23 1526   potassium chloride SA (KLOR-CON M) CR tablet 40 mEq, 40 mEq, Oral, Once, Evette Georges, MD  Current Outpatient Medications:    acetaminophen (TYLENOL) 650 MG CR tablet, Take 1,300 mg by mouth 2 (two) times daily., Disp: , Rfl:    Cholecalciferol (VITAMIN D-3) 5000 UNITS TABS, Take 5,000 Units by mouth every Monday, Wednesday, and Friday. , Disp: , Rfl:    citalopram  (CELEXA) 20 MG tablet, Take 20 mg by mouth daily., Disp: , Rfl:    Coenzyme Q10 (CO Q-10) 100 MG CAPS, Take 100 mg by mouth at bedtime., Disp: , Rfl:    colestipol (COLESTID) 5 g packet, Take 5 g by mouth 2 (two) times daily. , Disp: , Rfl: 3   fexofenadine (ALLEGRA) 180 MG tablet, Take 180 mg by mouth at bedtime., Disp: , Rfl:    Magnesium 250 MG TABS, Take 250 mg by mouth every Monday, Wednesday, and Friday., Disp: , Rfl:    Menthol, Topical Analgesic, (BIOFREEZE) 4 % GEL, Apply 1 application. topically daily as needed (pain)., Disp: , Rfl:    metoprolol succinate (TOPROL-XL) 50 MG 24 hr tablet, TAKE 2 TABLETS BY MOUTH 2 TIMES DAILY., Disp: 360 tablet, Rfl: 2   Multiple Vitamins-Minerals (MEMORY VITE PO), Take 3 tablets by mouth daily. Memory plus, Disp: , Rfl:    Multiple Vitamins-Minerals (MULTIVITAMIN WITH MINERALS) tablet, Take 1 tablet by mouth daily. 50+ for her, Disp: , Rfl:    Multiple Vitamins-Minerals (PRESERVISION AREDS 2) CAPS, Take 1 capsule by mouth in the morning and at bedtime., Disp: , Rfl:    potassium chloride (KLOR-CON) 10 MEQ tablet, Take 10 mEq by mouth at bedtime., Disp: , Rfl:    RABEprazole (ACIPHEX) 20 MG tablet, Take 20 mg by mouth daily. , Disp: , Rfl:    torsemide (DEMADEX) 20 MG tablet, Take 20 mg by mouth daily., Disp: , Rfl:    valsartan (DIOVAN) 160 MG tablet, Take 160 mg by mouth at bedtime., Disp: , Rfl:    warfarin (COUMADIN) 5 MG tablet, TAKE AS DIRECTED BY COUMADIN CLINIC, Disp: 80 tablet, Rfl: 1  ALLERGIES:   Allergies  Allergen Reactions   Ace Inhibitors Cough   Codeine Nausea Only     GI Intolerance   Erythromycin Other (See Comments)    Stomach pain    Iohexol Hives     Code: HIVES, Desc: patient states develops hives w/ iv contrast/mms    Iodinated Contrast Media Other (See Comments)    Hives    REVIEW OF SYSTEMS: REVIEWED IN DETAIL IN CHART  FAMILY HISTORY:   Family History  Problem Relation Age of Onset   Heart attack Mother     Heart attack Maternal Aunt    Heart attack Paternal Uncle     SOCIAL HISTORY:   Social History   Tobacco Use   Smoking status: Never   Smokeless tobacco: Never  Substance Use Topics   Alcohol use: Yes    Comment: rare     EXAMINATION: Vital signs in last 24 hours: Temp:  [97.8  F (36.6 C)-97.9 F (36.6 C)] 97.9 F (36.6 C) (09/07 1600) Pulse Rate:  [84-96] 89 (09/07 1930) Resp:  [12-20] 12 (09/07 1930) BP: (125-148)/(59-88) 144/66 (09/07 1930) SpO2:  [92 %-100 %] 92 % (09/07 1930) Weight:  [81.6 kg] 81.6 kg (09/07 1434)  {Exam; Complete Normal And System Select:17964}  Musculoskeletal Exam:   right leg xrot and painful   DIAGNOSTIC STUDIES: Recent laboratory studies: Recent Labs    02/26/23 1528  WBC 8.4  HGB 11.5*  HCT 34.5*  PLT 205   Recent Labs    02/26/23 1528  NA 138  K 3.3*  CL 105  CO2 22  BUN 28*  CREATININE 0.96  GLUCOSE 140*  CALCIUM 8.7*   Lab Results  Component Value Date   INR 3.0 (H) 02/26/2023   INR 3.0 02/08/2023   INR 2.8 12/28/2022     Recent Radiographic Studies :  DG Femur Min 2 Views Right  Result Date: 02/26/2023 CLINICAL DATA:  190176 Fall 190176; fall EXAM: RIGHT KNEE - 1-2 VIEW; RIGHT FEMUR 2 VIEWS; RIGHT TIBIA AND FIBULA - 2 VIEW; PORTABLE PELVIS 1-2 VIEWS COMPARISON:  None Available. FINDINGS: Osteopenia. There is an oblique fracture of the distal femur with approximately 3/4 shaft widths posterior displacement of the distal fragment. There is apex medial angulation. Status post total knee arthroplasty. Orthopedic hardware is intact and without periprosthetic fracture or lucency. Status post RIGHT hip arthroplasty. Orthopedic hardware is intact and without periprosthetic fracture or lucency. Sacrum is obscured by overlapping bowel contents. No definitive pelvic fracture is identified. Status post LEFT hip arthroplasty. Degenerative changes of the lower lumbar spine. Enthesopathic changes of the plantar calcaneus and Achilles  tendon. Mild midfoot degenerative changes. No additional fractures identified IMPRESSION: 1. There is an oblique angulated fracture of the distal femur with approximately 3/4 shaft widths posterior displacement of the distal fragment. 2. No definitive pelvic fracture is identified. If there is a persistent clinical concern for nondisplaced pelvic fracture, recommend dedicated pelvic CT or MRI. Electronically Signed   By: Meda Klinefelter M.D.   On: 02/26/2023 16:56   DG Tibia/Fibula Right  Result Date: 02/26/2023 CLINICAL DATA:  190176 Fall 190176; fall EXAM: RIGHT KNEE - 1-2 VIEW; RIGHT FEMUR 2 VIEWS; RIGHT TIBIA AND FIBULA - 2 VIEW; PORTABLE PELVIS 1-2 VIEWS COMPARISON:  None Available. FINDINGS: Osteopenia. There is an oblique fracture of the distal femur with approximately 3/4 shaft widths posterior displacement of the distal fragment. There is apex medial angulation. Status post total knee arthroplasty. Orthopedic hardware is intact and without periprosthetic fracture or lucency. Status post RIGHT hip arthroplasty. Orthopedic hardware is intact and without periprosthetic fracture or lucency. Sacrum is obscured by overlapping bowel contents. No definitive pelvic fracture is identified. Status post LEFT hip arthroplasty. Degenerative changes of the lower lumbar spine. Enthesopathic changes of the plantar calcaneus and Achilles tendon. Mild midfoot degenerative changes. No additional fractures identified IMPRESSION: 1. There is an oblique angulated fracture of the distal femur with approximately 3/4 shaft widths posterior displacement of the distal fragment. 2. No definitive pelvic fracture is identified. If there is a persistent clinical concern for nondisplaced pelvic fracture, recommend dedicated pelvic CT or MRI. Electronically Signed   By: Meda Klinefelter M.D.   On: 02/26/2023 16:56   DG Pelvis Portable  Result Date: 02/26/2023 CLINICAL DATA:  190176 Fall 190176; fall EXAM: RIGHT KNEE - 1-2 VIEW;  RIGHT FEMUR 2 VIEWS; RIGHT TIBIA AND FIBULA - 2 VIEW; PORTABLE PELVIS 1-2 VIEWS COMPARISON:  None Available. FINDINGS: Osteopenia. There is an oblique fracture of the distal femur with approximately 3/4 shaft widths posterior displacement of the distal fragment. There is apex medial angulation. Status post total knee arthroplasty. Orthopedic hardware is intact and without periprosthetic fracture or lucency. Status post RIGHT hip arthroplasty. Orthopedic hardware is intact and without periprosthetic fracture or lucency. Sacrum is obscured by overlapping bowel contents. No definitive pelvic fracture is identified. Status post LEFT hip arthroplasty. Degenerative changes of the lower lumbar spine. Enthesopathic changes of the plantar calcaneus and Achilles tendon. Mild midfoot degenerative changes. No additional fractures identified IMPRESSION: 1. There is an oblique angulated fracture of the distal femur with approximately 3/4 shaft widths posterior displacement of the distal fragment. 2. No definitive pelvic fracture is identified. If there is a persistent clinical concern for nondisplaced pelvic fracture, recommend dedicated pelvic CT or MRI. Electronically Signed   By: Meda Klinefelter M.D.   On: 02/26/2023 16:56   DG Knee 1-2 Views Right  Result Date: 02/26/2023 CLINICAL DATA:  190176 Fall 190176; fall EXAM: RIGHT KNEE - 1-2 VIEW; RIGHT FEMUR 2 VIEWS; RIGHT TIBIA AND FIBULA - 2 VIEW; PORTABLE PELVIS 1-2 VIEWS COMPARISON:  None Available. FINDINGS: Osteopenia. There is an oblique fracture of the distal femur with approximately 3/4 shaft widths posterior displacement of the distal fragment. There is apex medial angulation. Status post total knee arthroplasty. Orthopedic hardware is intact and without periprosthetic fracture or lucency. Status post RIGHT hip arthroplasty. Orthopedic hardware is intact and without periprosthetic fracture or lucency. Sacrum is obscured by overlapping bowel contents. No definitive  pelvic fracture is identified. Status post LEFT hip arthroplasty. Degenerative changes of the lower lumbar spine. Enthesopathic changes of the plantar calcaneus and Achilles tendon. Mild midfoot degenerative changes. No additional fractures identified IMPRESSION: 1. There is an oblique angulated fracture of the distal femur with approximately 3/4 shaft widths posterior displacement of the distal fragment. 2. No definitive pelvic fracture is identified. If there is a persistent clinical concern for nondisplaced pelvic fracture, recommend dedicated pelvic CT or MRI. Electronically Signed   By: Meda Klinefelter M.D.   On: 02/26/2023 16:56    ASSESSMENT:  right femur fracture periprosthetic both directions   PLAN:  Most likely best managed with plate ORIF.  Plan to consult trauma ortho team of [facility].  Also will need to come off coumadin.  Sierra [name] 02/26/2023, 8:10 PM

## 2023-02-26 NOTE — ED Notes (Signed)
PT changed and dry. New gown applied.

## 2023-02-27 DIAGNOSIS — S7291XA Unspecified fracture of right femur, initial encounter for closed fracture: Secondary | ICD-10-CM | POA: Diagnosis not present

## 2023-02-27 LAB — MAGNESIUM: Magnesium: 2.1 mg/dL (ref 1.7–2.4)

## 2023-02-27 LAB — CBC WITH DIFFERENTIAL/PLATELET
Abs Immature Granulocytes: 0.02 10*3/uL (ref 0.00–0.07)
Basophils Absolute: 0 10*3/uL (ref 0.0–0.1)
Basophils Relative: 1 %
Eosinophils Absolute: 0.1 10*3/uL (ref 0.0–0.5)
Eosinophils Relative: 1 %
HCT: 32 % — ABNORMAL LOW (ref 36.0–46.0)
Hemoglobin: 10.7 g/dL — ABNORMAL LOW (ref 12.0–15.0)
Immature Granulocytes: 0 %
Lymphocytes Relative: 16 %
Lymphs Abs: 1.1 10*3/uL (ref 0.7–4.0)
MCH: 31.7 pg (ref 26.0–34.0)
MCHC: 33.4 g/dL (ref 30.0–36.0)
MCV: 94.7 fL (ref 80.0–100.0)
Monocytes Absolute: 0.9 10*3/uL (ref 0.1–1.0)
Monocytes Relative: 12 %
Neutro Abs: 4.9 10*3/uL (ref 1.7–7.7)
Neutrophils Relative %: 70 %
Platelets: 221 10*3/uL (ref 150–400)
RBC: 3.38 MIL/uL — ABNORMAL LOW (ref 3.87–5.11)
RDW: 14.5 % (ref 11.5–15.5)
WBC: 7 10*3/uL (ref 4.0–10.5)
nRBC: 0 % (ref 0.0–0.2)

## 2023-02-27 LAB — PROTIME-INR
INR: 2.5 — ABNORMAL HIGH (ref 0.8–1.2)
Prothrombin Time: 27.4 s — ABNORMAL HIGH (ref 11.4–15.2)

## 2023-02-27 LAB — BASIC METABOLIC PANEL
Anion gap: 13 (ref 5–15)
BUN: 27 mg/dL — ABNORMAL HIGH (ref 8–23)
CO2: 25 mmol/L (ref 22–32)
Calcium: 9 mg/dL (ref 8.9–10.3)
Chloride: 102 mmol/L (ref 98–111)
Creatinine, Ser: 0.94 mg/dL (ref 0.44–1.00)
GFR, Estimated: >60 mL/min (ref >60)
Glucose, Bld: 127 mg/dL — ABNORMAL HIGH (ref 70–99)
Potassium: 4.1 mmol/L (ref 3.5–5.1)
Sodium: 140 mmol/L (ref 135–145)

## 2023-02-27 MED ORDER — ONDANSETRON 4 MG PO TBDP: 4.0000 mg | ORAL_TABLET | Freq: Once | ORAL | Status: AC

## 2023-02-27 MED ORDER — ONDANSETRON 4 MG PO TBDP
ORAL_TABLET | ORAL | Status: AC
  Administered 2023-02-27: 4 mg via ORAL
  Filled 2023-02-27: qty 1

## 2023-02-27 NOTE — Progress Notes (Signed)
OT Cancellation Note  Patient Details Name: ZHARICK GALDO MRN: [id number] DOB: 05-18-1946   Cancelled Treatment:    Reason Eval/Treat Not Completed: Patient not medically ready (Ortho consulted, planning PRIF. OT to evaluate post op)  Donia Pounds 02/27/2023, 8:39 AM

## 2023-02-27 NOTE — Progress Notes (Signed)
Subjective:   Right femur fracture pending ORIF by [facility] team.  Patient resting comfortably in bed this morning. She is hoping for surgery as soon as it is safe.  Activity level:  bed rest Diet tolerance:  ok Voiding:  ok Patient reports pain as mild and moderate.    Objective: Vital signs in last 24 hours: Temp:  [97.5 F (36.4 C)-98.4 F (36.9 C)] 98.4 F (36.9 C) (09/08 0853) Pulse Rate:  [84-96] 96 (09/08 0853) Resp:  [12-20] 17 (09/08 0853) BP: (124-148)/(59-88) 134/87 (09/08 0853) SpO2:  [92 %-100 %] 95 % (09/08 0853) Weight:  [81.6 kg] 81.6 kg (09/07 1434)  Labs: Recent Labs    02/26/23 1528 02/27/23 0742  HGB 11.5* 10.7*   Recent Labs    02/26/23 1528 02/27/23 0742  WBC 8.4 7.0  RBC 3.64* 3.38*  HCT 34.5* 32.0*  PLT 205 221   Recent Labs    02/26/23 1528 02/27/23 0742  NA 138 140  K 3.3* 4.1  CL 105 102  CO2 22 25  BUN 28* 27*  CREATININE 0.96 0.94  GLUCOSE 140* 127*  CALCIUM 8.7* 9.0   Recent Labs    02/26/23 1528 02/27/23 0742  INR 3.0* 2.5*    Physical Exam:  Neurologically intact ABD soft Neurovascular intact Sensation intact distally Intact pulses distally Dorsiflexion/Plantar flexion intact No cellulitis present Compartment soft  Assessment/Plan: Trauma team to perform surgery once INR down. Continue to hold coumadin. Will leave it up to DR. [facility]ix when to start NPO for surgery. Continue bed rest. We greatly appreciate trauma team help.  Ginger Organ Sierra [name] 02/27/2023, 10:03 AM

## 2023-02-27 NOTE — Hospital Course (Signed)
Pt Overview and Major Events to Date: Sierra [name] is a 77 y.o. female who was admitted to the [facility] Medicine Teaching Service at [facility] for oblique angulated fracture of the right distal femur. PMH of osteoarthritis status post multiple shoulder/hip/knee arthroplasties, chronic diastolic heart failure, paroxysmal A-fib, hypertension. Hospital course is outlined below by problem.   Femur fracture Presented after a fall due to tripping over an object in her home. No evidence of cardiac or neurological involvement.  Presented to ED with GCS 15 with intact ABCs.   Orthopedics consulted, performed Open reduction with internal fixation on 9/9. Surgical site well-healing. Pain was controlled w/ Tylenol, robaxin, and oxycodone. At time of discharge, pain was controlled, VSS. Will discharge to SNF and has Ortho f/u in 2weeks.    Paroxysmal atrial fibrillation Rate controlled on arrival. Metoprolol continued.  Coumadin restarted after surgery.  Chronic and stable conditions: Home meds are formulary equivalents continued for hypertension, heart failure, depression, allergies, GERD.  Issues for follow up: Follow up with Ortho in 2 weeks after discharge for wound check and repeat x-rays

## 2023-02-27 NOTE — Assessment & Plan Note (Addendum)
Pain controlled with regimen below. Likely ORIF 9/9 with orthopedics.  Surgical risk overall above average as per H&P.  Previously medically optimized for surgery from cardiac standpoint in April 2024. -Orthopedics following, appreciate recommendations -Consider further cardiac optimization with updated echo (last 2018) -Pain control with Dilaudid 1 mg every 3, scheduled Tylenol 650 every 6 -PT/OT once able -Regular diet for now, will need to be n.p.o. Monday morning 9/9 at 12 AM -Follow up AM labs: CBC, BMP, PT INR

## 2023-02-27 NOTE — Plan of Care (Signed)

## 2023-02-27 NOTE — Progress Notes (Signed)
PT Cancellation Note  Patient Details Name: Sierra [name] MRN: [id number] DOB: 01/31/1946   Cancelled Treatment:    Reason Eval/Treat Not Completed: Patient not medically ready. Pt with femur fx and waiting for ORIF. Will defer eval until after surgery.   Angelina Ok [facility] 02/27/2023, 8:31 AM Skip Mayer PT Acute Rehabilitation Services Office [phone]

## 2023-02-27 NOTE — Progress Notes (Signed)
     Daily Progress Note Intern Pager: [phone]  Patient name: Sierra [name] Medical record number: [id number] Date of birth: 01-08-1946 Age: 77 y.o. Gender: female  Primary Care Provider: Emilio Aspen, MD Consultants: Orthopedics Code Status: Full  Pt Overview and Major Events to Date:  9/7-admitted  Assessment and Plan:  Sierra [name] is a 77 y.o. female presenting with femur fracture after a fall likely secondary to mis-stepping/deconditioning. Pertinent PMH/PSH includes osteoarthritis status post multiple shoulder/hip/knee arthroplasties, chronic diastolic heart failure, paroxysmal A-fib, hypertension.  Assessment & Plan Femur fracture (HCC) Pain controlled with regimen below. Likely ORIF 9/9 with orthopedics.  Surgical risk overall above average as per H&P.  Previously medically optimized for surgery from cardiac standpoint in April 2024. -Orthopedics following, appreciate recommendations -Consider further cardiac optimization with updated echo (last 2018) -Pain control with Dilaudid 1 mg every 3, scheduled Tylenol 650 every 6 -PT/OT once able -Regular diet for now, will need to be n.p.o. Monday morning 9/9 at 12 AM -Follow up AM labs: CBC, BMP, PT INR   Chronic and Stable Issues: A-fib, hypertension, heart failure, depression, allergies, GERD-continue home regimen  FEN/GI: Regular diet, MiraLAX 17 g daily PPx: Holding warfarin, no heparin due to INR 3 (can initiate if less than 2 per pharmacy) Dispo:Pending PT recommendations  pending clinical improvement .  Subjective:  Doing overall well this morning. In great spirits given her situation. Her pain is controlled. She is comfortable in bed. She had a hotdog, unfortunately without mustard, last night. Has been able to take in fluids well.  Objective: Temp:  [97.8 F (36.6 C)-98.3 F (36.8 C)] 98.3 F (36.8 C) (09/07 2028) Pulse Rate:  [84-96] 91 (09/07 2015) Resp:  [12-20] 17 (09/07 2015) BP:  (124-148)/(59-88) 124/80 (09/07 2015) SpO2:  [92 %-100 %] 95 % (09/07 2015) Weight:  [81.6 kg] 81.6 kg (09/07 1434) Physical Exam: General: Lying in bed, NAD Cardiovascular: RRR without m/r/g Respiratory: CTAB anteriorly without w/r/r Abdomen: Soft, nontender to palpation, nondistended, normoactive bowel sounds Extremities: R limb everted and bent at the knee in soft brace  Laboratory: Most recent CBC Lab Results  Component Value Date   WBC 8.4 02/26/2023   HGB 11.5 (L) 02/26/2023   HCT 34.5 (L) 02/26/2023   MCV 94.8 02/26/2023   PLT 205 02/26/2023   Most recent BMP    Latest Ref Rng & Units 02/26/2023    3:28 PM  BMP  Glucose 70 - 99 mg/dL 962   BUN 8 - 23 mg/dL 28   Creatinine 9.52 - 1.00 mg/dL 8.41   Sodium 324 - 401 mmol/L 138   Potassium 3.5 - 5.1 mmol/L 3.3   Chloride 98 - 111 mmol/L 105   CO2 22 - 32 mmol/L 22   Calcium 8.9 - 10.3 mg/dL 8.7     Evette Georges, MD 02/27/2023, 2:11 AM  PGY-2, [facility] FPTS Intern pager: [phone], text pages welcome Secure chat group [facility] Teaching Service

## 2023-02-28 ENCOUNTER — Inpatient Hospital Stay (HOSPITAL_COMMUNITY): Payer: Medicare Other

## 2023-02-28 ENCOUNTER — Other Ambulatory Visit: Payer: Self-pay

## 2023-02-28 ENCOUNTER — Encounter (HOSPITAL_COMMUNITY): Payer: Self-pay | Admitting: Family Medicine

## 2023-02-28 ENCOUNTER — Inpatient Hospital Stay (HOSPITAL_COMMUNITY): Payer: Medicare Other | Admitting: Anesthesiology

## 2023-02-28 ENCOUNTER — Encounter (HOSPITAL_COMMUNITY)
Admission: EM | Disposition: A | Discharge status: Skilled Nursing Facility | Payer: Self-pay | Source: Home / Self Care | Attending: Family Medicine

## 2023-02-28 DIAGNOSIS — S7291XA Unspecified fracture of right femur, initial encounter for closed fracture: Secondary | ICD-10-CM | POA: Diagnosis not present

## 2023-02-28 HISTORY — PX: ORIF FEMUR FRACTURE: SHX2119

## 2023-02-28 LAB — CBC
HCT: 32.1 % — ABNORMAL LOW (ref 36.0–46.0)
Hemoglobin: 10.4 g/dL — ABNORMAL LOW (ref 12.0–15.0)
MCH: 31.3 pg (ref 26.0–34.0)
MCHC: 32.4 g/dL (ref 30.0–36.0)
MCV: 96.7 fL (ref 80.0–100.0)
Platelets: 217 10*3/uL (ref 150–400)
RBC: 3.32 MIL/uL — ABNORMAL LOW (ref 3.87–5.11)
RDW: 14.6 % (ref 11.5–15.5)
WBC: 7.4 10*3/uL (ref 4.0–10.5)
nRBC: 0 % (ref 0.0–0.2)

## 2023-02-28 LAB — BASIC METABOLIC PANEL
Anion gap: 11 (ref 5–15)
BUN: 32 mg/dL — ABNORMAL HIGH (ref 8–23)
CO2: 26 mmol/L (ref 22–32)
Calcium: 8.6 mg/dL — ABNORMAL LOW (ref 8.9–10.3)
Chloride: 100 mmol/L (ref 98–111)
Creatinine, Ser: 1.26 mg/dL — ABNORMAL HIGH (ref 0.44–1.00)
GFR, Estimated: 44 mL/min — ABNORMAL LOW (ref >60)
Glucose, Bld: 133 mg/dL — ABNORMAL HIGH (ref 70–99)
Potassium: 4.1 mmol/L (ref 3.5–5.1)
Sodium: 137 mmol/L (ref 135–145)

## 2023-02-28 LAB — SURGICAL PCR SCREEN
MRSA, PCR: NEGATIVE
Staphylococcus aureus: NEGATIVE

## 2023-02-28 LAB — PROTIME-INR
INR: 2.8 — ABNORMAL HIGH (ref 0.8–1.2)
Prothrombin Time: 30 s — ABNORMAL HIGH (ref 11.4–15.2)

## 2023-02-28 SURGERY — OPEN REDUCTION INTERNAL FIXATION (ORIF) DISTAL FEMUR FRACTURE
Anesthesia: Monitor Anesthesia Care | Laterality: Right

## 2023-02-28 MED ORDER — LIDOCAINE 2% (20 MG/ML) 5 ML SYRINGE
INTRAMUSCULAR | Status: DC | PRN
  Administered 2023-02-28: 60 mg via INTRAVENOUS

## 2023-02-28 MED ORDER — HYDROMORPHONE HCL 1 MG/ML IJ SOLN
0.5000 mg | INTRAMUSCULAR | Status: AC | PRN
  Administered 2023-02-28 (×2): 0.5 mg via INTRAVENOUS

## 2023-02-28 MED ORDER — ROCURONIUM BROMIDE 10 MG/ML (PF) SYRINGE
PREFILLED_SYRINGE | INTRAVENOUS | Status: DC | PRN
  Administered 2023-02-28: 60 mg via INTRAVENOUS

## 2023-02-28 MED ORDER — FENTANYL CITRATE (PF) 250 MCG/5ML IJ SOLN
INTRAMUSCULAR | Status: DC | PRN
  Administered 2023-02-28: 50 ug via INTRAVENOUS
  Administered 2023-02-28 (×2): 100 ug via INTRAVENOUS

## 2023-02-28 MED ORDER — CEFAZOLIN SODIUM-DEXTROSE 2-4 GM/100ML-% IV SOLN
2.0000 g | INTRAVENOUS | Status: AC
  Administered 2023-02-28: 2 g via INTRAVENOUS
  Filled 2023-02-28: qty 100

## 2023-02-28 MED ORDER — SUGAMMADEX SODIUM 200 MG/2ML IV SOLN
INTRAVENOUS | Status: DC | PRN
Start: 2023-02-28 — End: 2023-02-28
  Administered 2023-02-28: 163.2 mg via INTRAVENOUS

## 2023-02-28 MED ORDER — FENTANYL CITRATE (PF) 100 MCG/2ML IJ SOLN
INTRAMUSCULAR | Status: AC
  Administered 2023-02-28: 50 ug via INTRAVENOUS
  Filled 2023-02-28: qty 2

## 2023-02-28 MED ORDER — DEXAMETHASONE SODIUM PHOSPHATE 10 MG/ML IJ SOLN
INTRAMUSCULAR | Status: AC
  Filled 2023-02-28: qty 1

## 2023-02-28 MED ORDER — 0.9 % SODIUM CHLORIDE (POUR BTL) OPTIME
TOPICAL | Status: DC | PRN
  Administered 2023-02-28: 1000 mL

## 2023-02-28 MED ORDER — POVIDONE-IODINE 10 % EX SWAB
2.0000 | Freq: Once | CUTANEOUS | Status: AC
  Administered 2023-02-28: 2 via TOPICAL

## 2023-02-28 MED ORDER — DROPERIDOL 2.5 MG/ML IJ SOLN: 0.6250 mg | Freq: Once | INTRAMUSCULAR | Status: DC | PRN

## 2023-02-28 MED ORDER — FENTANYL CITRATE (PF) 250 MCG/5ML IJ SOLN
INTRAMUSCULAR | Status: AC
  Filled 2023-02-28: qty 5

## 2023-02-28 MED ORDER — ROPIVACAINE HCL 7.5 MG/ML IJ SOLN
INTRAMUSCULAR | Status: DC | PRN
  Administered 2023-02-28: 20 mL via PERINEURAL

## 2023-02-28 MED ORDER — FENTANYL CITRATE (PF) 100 MCG/2ML IJ SOLN
INTRAMUSCULAR | Status: AC
  Filled 2023-02-28: qty 2

## 2023-02-28 MED ORDER — DEXAMETHASONE SODIUM PHOSPHATE 10 MG/ML IJ SOLN
INTRAMUSCULAR | Status: DC | PRN
Start: 2023-02-28 — End: 2023-02-28
  Administered 2023-02-28: 10 mg

## 2023-02-28 MED ORDER — PROPOFOL 10 MG/ML IV BOLUS
INTRAVENOUS | Status: AC
  Filled 2023-02-28: qty 20

## 2023-02-28 MED ORDER — PHENYLEPHRINE 80 MCG/ML (10ML) SYRINGE FOR IV PUSH (FOR BLOOD PRESSURE SUPPORT)
PREFILLED_SYRINGE | INTRAVENOUS | Status: DC | PRN
  Administered 2023-02-28: 160 ug via INTRAVENOUS

## 2023-02-28 MED ORDER — ONDANSETRON HCL 4 MG/2ML IJ SOLN
INTRAMUSCULAR | Status: AC
  Filled 2023-02-28: qty 2

## 2023-02-28 MED ORDER — ONDANSETRON HCL 4 MG/2ML IJ SOLN
INTRAMUSCULAR | Status: DC | PRN
  Administered 2023-02-28: 4 mg via INTRAVENOUS

## 2023-02-28 MED ORDER — ORAL CARE MOUTH RINSE: 15.0000 mL | Freq: Once | OROMUCOSAL | Status: AC

## 2023-02-28 MED ORDER — CHLORHEXIDINE GLUCONATE 0.12 % MT SOLN
15.0000 mL | Freq: Once | OROMUCOSAL | Status: AC
  Administered 2023-02-28: 15 mL via OROMUCOSAL

## 2023-02-28 MED ORDER — CHLORHEXIDINE GLUCONATE 4 % EX SOLN: 60.0000 mL | Freq: Once | CUTANEOUS | Status: DC

## 2023-02-28 MED ORDER — VANCOMYCIN HCL 1000 MG IV SOLR
INTRAVENOUS | Status: AC
  Filled 2023-02-28: qty 20

## 2023-02-28 MED ORDER — LACTATED RINGERS IV SOLN: INTRAVENOUS | Status: DC

## 2023-02-28 MED ORDER — HYDROMORPHONE HCL 1 MG/ML IJ SOLN
INTRAMUSCULAR | Status: AC
  Filled 2023-02-28: qty 1

## 2023-02-28 MED ORDER — PHENYLEPHRINE 80 MCG/ML (10ML) SYRINGE FOR IV PUSH (FOR BLOOD PRESSURE SUPPORT)
PREFILLED_SYRINGE | INTRAVENOUS | Status: AC
  Filled 2023-02-28: qty 10

## 2023-02-28 MED ORDER — PROPOFOL 10 MG/ML IV BOLUS
INTRAVENOUS | Status: DC | PRN
  Administered 2023-02-28: 140 ug via INTRAVENOUS

## 2023-02-28 MED ORDER — VANCOMYCIN HCL 1000 MG IV SOLR
INTRAVENOUS | Status: DC | PRN
  Administered 2023-02-28: 1000 mg

## 2023-02-28 MED ORDER — VITAMIN K1 10 MG/ML IJ SOLN
2.5000 mg | Freq: Once | INTRAVENOUS | Status: AC
  Administered 2023-02-28: 2.5 mg via INTRAVENOUS
  Filled 2023-02-28: qty 0.25

## 2023-02-28 MED ORDER — FENTANYL CITRATE (PF) 100 MCG/2ML IJ SOLN: 50.0000 ug | Freq: Once | INTRAMUSCULAR | Status: AC

## 2023-02-28 MED ORDER — DEXAMETHASONE SODIUM PHOSPHATE 10 MG/ML IJ SOLN
INTRAMUSCULAR | Status: DC | PRN
Start: 2023-02-28 — End: 2023-02-28
  Administered 2023-02-28: 5 mg via INTRAVENOUS

## 2023-02-28 MED ORDER — TRANEXAMIC ACID-NACL 1000-0.7 MG/100ML-% IV SOLN
1000.0000 mg | INTRAVENOUS | Status: AC
  Administered 2023-02-28: 1000 mg via INTRAVENOUS
  Filled 2023-02-28: qty 100

## 2023-02-28 MED ORDER — FENTANYL CITRATE (PF) 100 MCG/2ML IJ SOLN
25.0000 ug | INTRAMUSCULAR | Status: DC | PRN
  Administered 2023-02-28 (×3): 50 ug via INTRAVENOUS

## 2023-02-28 SURGICAL SUPPLY — 79 items
ADH SKN CLS APL DERMABOND .7 (GAUZE/BANDAGES/DRESSINGS) ×2
APL PRP STRL LF DISP 70% ISPRP (MISCELLANEOUS) ×1
BAG COUNTER SPONGE SURGICOUNT (BAG) ×1 IMPLANT
BAG SPNG CNTER NS LX DISP (BAG) ×1
BIT DRILL 4.3 (BIT) ×1
BIT DRILL 4.3X300MM (BIT) IMPLANT
BIT DRILL LONG 3.3 (BIT) IMPLANT
BIT DRILL QC 3.3X195 (BIT) IMPLANT
BLADE CLIPPER SURG (BLADE) IMPLANT
BNDG CMPR 5X4 KNIT ELC UNQ LF (GAUZE/BANDAGES/DRESSINGS) ×1
BNDG CMPR 5X6 CHSV STRCH STRL (GAUZE/BANDAGES/DRESSINGS) ×1
BNDG CMPR MED 10X6 ELC LF (GAUZE/BANDAGES/DRESSINGS) ×1
BNDG COHESIVE 6X5 TAN ST LF (GAUZE/BANDAGES/DRESSINGS) ×1 IMPLANT
BNDG ELASTIC 4INX 5YD STR LF (GAUZE/BANDAGES/DRESSINGS) IMPLANT
BNDG ELASTIC 6X10 VLCR STRL LF (GAUZE/BANDAGES/DRESSINGS) ×1 IMPLANT
BRUSH SCRUB EZ PLAIN DRY (MISCELLANEOUS) ×2 IMPLANT
CANISTER SUCT 3000ML PPV (MISCELLANEOUS) ×1 IMPLANT
CAP LOCK NCB (Cap) IMPLANT
CHLORAPREP W/TINT 26 (MISCELLANEOUS) ×1 IMPLANT
COVER SURGICAL LIGHT HANDLE (MISCELLANEOUS) ×1 IMPLANT
DERMABOND ADVANCED .7 DNX12 (GAUZE/BANDAGES/DRESSINGS) IMPLANT
DRAPE C-ARM 42X72 X-RAY (DRAPES) ×1 IMPLANT
DRAPE C-ARMOR (DRAPES) ×1 IMPLANT
DRAPE HALF SHEET 40X57 (DRAPES) ×2 IMPLANT
DRAPE ORTHO SPLIT 77X108 STRL (DRAPES) ×2
DRAPE SURG 17X23 STRL (DRAPES) ×1 IMPLANT
DRAPE SURG ORHT 6 SPLT 77X108 (DRAPES) ×2 IMPLANT
DRAPE U-SHAPE 47X51 STRL (DRAPES) ×1 IMPLANT
DRESSING MEPILEX FLEX 4X4 (GAUZE/BANDAGES/DRESSINGS) IMPLANT
DRSG ADAPTIC 3X8 NADH LF (GAUZE/BANDAGES/DRESSINGS) IMPLANT
DRSG MEPILEX FLEX 4X4 (GAUZE/BANDAGES/DRESSINGS)
DRSG MEPILEX POST OP 4X12 (GAUZE/BANDAGES/DRESSINGS) IMPLANT
DRSG MEPILEX POST OP 4X8 (GAUZE/BANDAGES/DRESSINGS) IMPLANT
ELECT REM PT RETURN 9FT ADLT (ELECTROSURGICAL) ×1
ELECTRODE REM PT RTRN 9FT ADLT (ELECTROSURGICAL) ×1 IMPLANT
GAUZE PAD ABD 8X10 STRL (GAUZE/BANDAGES/DRESSINGS) ×3 IMPLANT
GAUZE SPONGE 4X4 12PLY STRL (GAUZE/BANDAGES/DRESSINGS) ×1 IMPLANT
GLOVE BIO SURGEON STRL SZ 6.5 (GLOVE) ×3 IMPLANT
GLOVE BIO SURGEON STRL SZ7.5 (GLOVE) ×4 IMPLANT
GLOVE BIOGEL PI IND STRL 6.5 (GLOVE) ×1 IMPLANT
GLOVE BIOGEL PI IND STRL 7.5 (GLOVE) ×1 IMPLANT
GOWN STRL REUS W/ TWL LRG LVL3 (GOWN DISPOSABLE) ×3 IMPLANT
GOWN STRL REUS W/TWL LRG LVL3 (GOWN DISPOSABLE) ×3
K-WIRE 2.0 (WIRE) ×1
K-WIRE FXSTD 280X2XNS SS (WIRE) ×1
KIT BASIN OR (CUSTOM PROCEDURE TRAY) ×1 IMPLANT
KIT TURNOVER KIT B (KITS) ×1 IMPLANT
KWIRE FXSTD 280X2XNS SS (WIRE) IMPLANT
NS IRRIG 1000ML POUR BTL (IV SOLUTION) ×1 IMPLANT
PACK TOTAL JOINT (CUSTOM PROCEDURE TRAY) ×1 IMPLANT
PAD ARMBOARD 7.5X6 YLW CONV (MISCELLANEOUS) ×2 IMPLANT
PAD CAST 4YDX4 CTTN HI CHSV (CAST SUPPLIES) ×1 IMPLANT
PADDING CAST COTTON 4X4 STRL (CAST SUPPLIES) ×1
PADDING CAST COTTON 6X4 STRL (CAST SUPPLIES) ×1 IMPLANT
PLATE DISTAL FEMUR 15H 317M RT (Plate) IMPLANT
SCREW 5.0 80MM (Screw) IMPLANT
SCREW CORTICAL NCB 5.0X40 (Screw) IMPLANT
SCREW NCB 3.5X75X5X6.2XST (Screw) IMPLANT
SCREW NCB 4.0MX30M (Screw) IMPLANT
SCREW NCB 4.0MX34M (Screw) IMPLANT
SCREW NCB 4.0X36MM (Screw) IMPLANT
SCREW NCB 5.0X34MM (Screw) IMPLANT
SCREW NCB 5.0X75MM (Screw) ×2 IMPLANT
SPONGE T-LAP 18X18 ~~LOC~~+RFID (SPONGE) IMPLANT
STAPLER VISISTAT 35W (STAPLE) ×1 IMPLANT
SUCTION TUBE FRAZIER 10FR DISP (SUCTIONS) ×1 IMPLANT
SUT ETHILON 3 0 PS 1 (SUTURE) ×2 IMPLANT
SUT MNCRL AB 3-0 PS2 18 (SUTURE) IMPLANT
SUT MON AB 2-0 SH 27 (SUTURE) ×1
SUT MON AB 2-0 SH27 (SUTURE) IMPLANT
SUT VIC AB 0 CT1 27 (SUTURE)
SUT VIC AB 0 CT1 27XBRD ANBCTR (SUTURE) IMPLANT
SUT VIC AB 1 CT1 27 (SUTURE)
SUT VIC AB 1 CT1 27XBRD ANBCTR (SUTURE) IMPLANT
SUT VIC AB 2-0 CT1 27 (SUTURE) ×2
SUT VIC AB 2-0 CT1 TAPERPNT 27 (SUTURE) ×2 IMPLANT
TOWEL GREEN STERILE (TOWEL DISPOSABLE) ×2 IMPLANT
TRAY FOLEY MTR SLVR 16FR STAT (SET/KITS/TRAYS/PACK) IMPLANT
WATER STERILE IRR 1000ML POUR (IV SOLUTION) ×2 IMPLANT

## 2023-02-28 NOTE — H&P (View-Only) (Signed)
Reason for Consult:Right femur fx Referring Physician: Levert Feinstein Time called: 1102 Time at bedside: 1117   Sierra [name] is an 77 y.o. female.  HPI: Sierra [name] tripped on a chair leg at home that caused her to lose balance and fall. She had immediate right leg pain and could not get up. She was brought to the ED where x-rays showed a periprosthetic femur fx and orthopedic surgery was consulted. She lives at home with her husband and uses either a RW or cane to ambulate.  Past Medical History:  Diagnosis Date   Anxiety    Arthritis    "joints" (09/19/2013)   Atrial fibrillation (HCC)    Basal cell carcinoma of cheek    left   CHF (congestive heart failure) (HCC)    Chronic lower back pain    Dysrhythmia    GERD (gastroesophageal reflux disease)    H/O hiatal hernia    Hypertension    Migraine    "haven't had any in a long time" (09/19/2013)   Pneumonia ~ 2012    Past Surgical History:  Procedure Laterality Date   BASAL CELL CARCINOMA EXCISION Left    "cheek"   CARDIOVERSION N/A 07/15/2017   Procedure: CARDIOVERSION;  Surgeon: Vesta Mixer, MD;  Location: [facility] ENDOSCOPY;  Service: Cardiovascular;  Laterality: N/A;   CHOLECYSTECTOMY  02/19/1989   COLONOSCOPY WITH PROPOFOL N/A 07/05/2016   Procedure: COLONOSCOPY WITH PROPOFOL;  Surgeon: Charolett Bumpers, MD;  Location: [facility] ENDOSCOPY;  Service: Endoscopy;  Laterality: N/A;   DILATION AND CURETTAGE OF UTERUS     HERNIA REPAIR  06/21/2009   umbilical   JOINT REPLACEMENT     MOHS SURGERY     OOPHORECTOMY Bilateral    REVERSE SHOULDER ARTHROPLASTY Left 03/11/2022   Procedure: REVERSE SHOULDER ARTHROPLASTY;  Surgeon: Jones Broom, MD;  Location: [facility] ORS;  Service: Orthopedics;  Laterality: Left;   REVISION TOTAL HIP ARTHROPLASTY Left 09/19/2013   SHOULDER ARTHROSCOPY W/ ROTATOR CUFF REPAIR Right    TEMPOROMANDIBULAR JOINT SURGERY     TMJ ARTHROPLASTY     TOTAL HIP ARTHROPLASTY Left 06/22/2007   TOTAL HIP ARTHROPLASTY Right  06/21/2009   TOTAL HIP REVISION Left 09/19/2013   Procedure: LEFT TOTAL HIP REVISION;  Surgeon: Nestor Lewandowsky, MD;  Location: [facility] OR;  Service: Orthopedics;  Laterality: Left;   TOTAL KNEE ARTHROPLASTY Right 09/21/2022   Procedure: RIGHT TOTAL KNEE ARTHROPLASTY;  Surgeon: Marcene Corning, MD;  Location: [facility] ORS;  Service: Orthopedics;  Laterality: Right;   TUBAL LIGATION     VAGINAL HYSTERECTOMY  02/19/1989    Family History  Problem Relation Age of Onset   Heart attack Mother    Heart attack Maternal Aunt    Heart attack Paternal Uncle     Social History:  reports that she has never smoked. She has never used smokeless tobacco. She reports current alcohol use. She reports that she does not use drugs.  Allergies:  Allergies  Allergen Reactions   Ace Inhibitors Cough   Codeine Nausea Only     GI Intolerance   Erythromycin Other (See Comments)    Stomach pain    Iohexol Hives     Code: HIVES, Desc: patient states develops hives w/ iv contrast/mms    Iodinated Contrast Media Other (See Comments)    Hives    Medications: I have reviewed the patient's current medications.  Results for orders placed or performed during the hospital encounter of 02/26/23 (from the past 48 hour(s))  CBC with  Differential     Status: Abnormal   Collection Time: 02/26/23  3:28 PM  Result Value Ref Range   WBC 8.4 4.0 - 10.5 K/uL   RBC 3.64 (L) 3.87 - 5.11 MIL/uL   Hemoglobin 11.5 (L) 12.0 - 15.0 g/dL   HCT 40.9 (L) 81.1 - 91.4 %   MCV 94.8 80.0 - 100.0 fL   MCH 31.6 26.0 - 34.0 pg   MCHC 33.3 30.0 - 36.0 g/dL   RDW 78.2 95.6 - 21.3 %   Platelets 205 150 - 400 K/uL   nRBC 0.0 0.0 - 0.2 %   Neutrophils Relative % 87 %   Neutro Abs 7.4 1.7 - 7.7 K/uL   Lymphocytes Relative 6 %   Lymphs Abs 0.5 (L) 0.7 - 4.0 K/uL   Monocytes Relative 5 %   Monocytes Absolute 0.4 0.1 - 1.0 K/uL   Eosinophils Relative 1 %   Eosinophils Absolute 0.0 0.0 - 0.5 K/uL   Basophils Relative 1 %   Basophils Absolute  0.0 0.0 - 0.1 K/uL   Immature Granulocytes 0 %   Abs Immature Granulocytes 0.02 0.00 - 0.07 K/uL    Comment: Performed at [facility], [address], [place], [place] [address]  Comprehensive metabolic panel     Status: Abnormal   Collection Time: 02/26/23  3:28 PM  Result Value Ref Range   Sodium 138 135 - 145 mmol/L   Potassium 3.3 (L) 3.5 - 5.1 mmol/L   Chloride 105 98 - 111 mmol/L   CO2 22 22 - 32 mmol/L   Glucose, Bld 140 (H) 70 - 99 mg/dL    Comment: Glucose reference range applies only to samples taken after fasting for at least 8 hours.   BUN 28 (H) 8 - 23 mg/dL   Creatinine, Ser 8.46 0.44 - 1.00 mg/dL   Calcium 8.7 (L) 8.9 - 10.3 mg/dL   Total Protein 7.1 6.5 - 8.1 g/dL   Albumin 4.1 3.5 - 5.0 g/dL   AST 26 15 - 41 U/L   ALT 19 0 - 44 U/L   Alkaline Phosphatase 82 38 - 126 U/L   Total Bilirubin 0.9 0.3 - 1.2 mg/dL   GFR, Estimated >96 >29 mL/min    Comment: (NOTE) Calculated using the CKD-EPI Creatinine Equation (2021)    Anion gap 11 5 - 15    Comment: Performed at [facility], [address] [place], [place] [address]  Protime-INR     Status: Abnormal   Collection Time: 02/26/23  3:28 PM  Result Value Ref Range   Prothrombin Time 31.3 (H) 11.4 - 15.2 seconds   INR 3.0 (H) 0.8 - 1.2    Comment: (NOTE) INR goal varies based on device and disease states. Performed at [facility], [address] [place], [place] [address]   Basic metabolic panel     Status: Abnormal   Collection Time: 02/27/23  7:42 AM  Result Value Ref Range   Sodium 140 135 - 145 mmol/L   Potassium 4.1 3.5 - 5.1 mmol/L   Chloride 102 98 - 111 mmol/L   CO2 25 22 - 32 mmol/L   Glucose, Bld 127 (H) 70 - 99 mg/dL    Comment: Glucose reference range applies only to samples taken after fasting for at least 8 hours.   BUN 27 (H) 8 - 23 mg/dL   Creatinine, Ser 1.02 0.44 - 1.00 mg/dL   Calcium 9.0 8.9 - 72.5 mg/dL  GFR, Estimated >60 >60 mL/min    Comment: (NOTE) Calculated  using the CKD-EPI Creatinine Equation (2021)    Anion gap 13 5 - 15    Comment: Performed at [facility], [address] [place], [place] [address]  CBC with Differential/Platelet     Status: Abnormal   Collection Time: 02/27/23  7:42 AM  Result Value Ref Range   WBC 7.0 4.0 - 10.5 K/uL   RBC 3.38 (L) 3.87 - 5.11 MIL/uL   Hemoglobin 10.7 (L) 12.0 - 15.0 g/dL   HCT 52.8 (L) 41.3 - 24.4 %   MCV 94.7 80.0 - 100.0 fL   MCH 31.7 26.0 - 34.0 pg   MCHC 33.4 30.0 - 36.0 g/dL   RDW 01.0 27.2 - 53.6 %   Platelets 221 150 - 400 K/uL   nRBC 0.0 0.0 - 0.2 %   Neutrophils Relative % 70 %   Neutro Abs 4.9 1.7 - 7.7 K/uL   Lymphocytes Relative 16 %   Lymphs Abs 1.1 0.7 - 4.0 K/uL   Monocytes Relative 12 %   Monocytes Absolute 0.9 0.1 - 1.0 K/uL   Eosinophils Relative 1 %   Eosinophils Absolute 0.1 0.0 - 0.5 K/uL   Basophils Relative 1 %   Basophils Absolute 0.0 0.0 - 0.1 K/uL   Immature Granulocytes 0 %   Abs Immature Granulocytes 0.02 0.00 - 0.07 K/uL    Comment: Performed at [facility], [address] [place], [place] [address]  Protime-INR     Status: Abnormal   Collection Time: 02/27/23  7:42 AM  Result Value Ref Range   Prothrombin Time 27.4 (H) 11.4 - 15.2 seconds   INR 2.5 (H) 0.8 - 1.2    Comment: (NOTE) INR goal varies based on device and disease states. Performed at [facility], [address] [place], [place] [address]   Magnesium     Status: None   Collection Time: 02/27/23  7:42 AM  Result Value Ref Range   Magnesium 2.1 1.7 - 2.4 mg/dL    Comment: Performed at [facility], [address] [place], [place] [address]  CBC     Status: Abnormal   Collection Time: 02/28/23  9:47 AM  Result Value Ref Range   WBC 7.4 4.0 - 10.5 K/uL   RBC 3.32 (L) 3.87 - 5.11 MIL/uL   Hemoglobin 10.4 (L) 12.0 - 15.0 g/dL   HCT 75.6 (L) 43.3 - 29.5 %   MCV 96.7 80.0 - 100.0 fL   MCH 31.3 26.0 - 34.0 pg   MCHC 32.4 30.0 - 36.0 g/dL   RDW 18.8 41.6 - 60.6 %    Platelets 217 150 - 400 K/uL   nRBC 0.0 0.0 - 0.2 %    Comment: Performed at [facility], [address] [place], [place] [address]  Basic metabolic panel     Status: Abnormal   Collection Time: 02/28/23  9:47 AM  Result Value Ref Range   Sodium 137 135 - 145 mmol/L   Potassium 4.1 3.5 - 5.1 mmol/L   Chloride 100 98 - 111 mmol/L   CO2 26 22 - 32 mmol/L   Glucose, Bld 133 (H) 70 - 99 mg/dL    Comment: Glucose reference range applies only to samples taken after fasting for at least 8 hours.   BUN 32 (H) 8 - 23 mg/dL   Creatinine, Ser 1.09 (H) 0.44 - 1.00 mg/dL   Calcium  8.6 (L) 8.9 - 10.3 mg/dL   GFR, Estimated 44 (L) >60 mL/min    Comment: (NOTE) Calculated using the CKD-EPI Creatinine Equation (2021)    Anion gap 11 5 - 15    Comment: Performed at [facility], [address] [place], [place] [address]  Protime-INR     Status: Abnormal   Collection Time: 02/28/23 10:35 AM  Result Value Ref Range   Prothrombin Time 30.0 (H) 11.4 - 15.2 seconds   INR 2.8 (H) 0.8 - 1.2    Comment: (NOTE) INR goal varies based on device and disease states. Performed at [facility], [address], [place], [place] [address]     DG Femur Min 2 Views Right  Result Date: 02/26/2023 CLINICAL DATA:  [id number] Fall 190176; fall EXAM: RIGHT KNEE - 1-2 VIEW; RIGHT FEMUR 2 VIEWS; RIGHT TIBIA AND FIBULA - 2 VIEW; PORTABLE PELVIS 1-2 VIEWS COMPARISON:  None Available. FINDINGS: Osteopenia. There is an oblique fracture of the distal femur with approximately 3/4 shaft widths posterior displacement of the distal fragment. There is apex medial angulation. Status post total knee arthroplasty. Orthopedic hardware is intact and without periprosthetic fracture or lucency. Status post RIGHT hip arthroplasty. Orthopedic hardware is intact and without periprosthetic fracture or lucency. Sacrum is obscured by overlapping bowel contents. No definitive pelvic fracture is identified. Status post LEFT hip  arthroplasty. Degenerative changes of the lower lumbar spine. Enthesopathic changes of the plantar calcaneus and Achilles tendon. Mild midfoot degenerative changes. No additional fractures identified IMPRESSION: 1. There is an oblique angulated fracture of the distal femur with approximately 3/4 shaft widths posterior displacement of the distal fragment. 2. No definitive pelvic fracture is identified. If there is a persistent clinical concern for nondisplaced pelvic fracture, recommend dedicated pelvic CT or MRI. Electronically Signed   By: Meda Klinefelter M.D.   On: 02/26/2023 16:56   DG Tibia/Fibula Right  Result Date: 02/26/2023 CLINICAL DATA:  [id number] Fall 190176; fall EXAM: RIGHT KNEE - 1-2 VIEW; RIGHT FEMUR 2 VIEWS; RIGHT TIBIA AND FIBULA - 2 VIEW; PORTABLE PELVIS 1-2 VIEWS COMPARISON:  None Available. FINDINGS: Osteopenia. There is an oblique fracture of the distal femur with approximately 3/4 shaft widths posterior displacement of the distal fragment. There is apex medial angulation. Status post total knee arthroplasty. Orthopedic hardware is intact and without periprosthetic fracture or lucency. Status post RIGHT hip arthroplasty. Orthopedic hardware is intact and without periprosthetic fracture or lucency. Sacrum is obscured by overlapping bowel contents. No definitive pelvic fracture is identified. Status post LEFT hip arthroplasty. Degenerative changes of the lower lumbar spine. Enthesopathic changes of the plantar calcaneus and Achilles tendon. Mild midfoot degenerative changes. No additional fractures identified IMPRESSION: 1. There is an oblique angulated fracture of the distal femur with approximately 3/4 shaft widths posterior displacement of the distal fragment. 2. No definitive pelvic fracture is identified. If there is a persistent clinical concern for nondisplaced pelvic fracture, recommend dedicated pelvic CT or MRI. Electronically Signed   By: Meda Klinefelter M.D.   On: 02/26/2023  16:56   DG Pelvis Portable  Result Date: 02/26/2023 CLINICAL DATA:  [id number] Fall 190176; fall EXAM: RIGHT KNEE - 1-2 VIEW; RIGHT FEMUR 2 VIEWS; RIGHT TIBIA AND FIBULA - 2 VIEW; PORTABLE PELVIS 1-2 VIEWS COMPARISON:  None Available. FINDINGS: Osteopenia. There is an oblique fracture of the distal femur with approximately 3/4 shaft widths posterior displacement of the distal fragment. There is apex medial angulation. Status post total knee arthroplasty. Orthopedic  hardware is intact and without periprosthetic fracture or lucency. Status post RIGHT hip arthroplasty. Orthopedic hardware is intact and without periprosthetic fracture or lucency. Sacrum is obscured by overlapping bowel contents. No definitive pelvic fracture is identified. Status post LEFT hip arthroplasty. Degenerative changes of the lower lumbar spine. Enthesopathic changes of the plantar calcaneus and Achilles tendon. Mild midfoot degenerative changes. No additional fractures identified IMPRESSION: 1. There is an oblique angulated fracture of the distal femur with approximately 3/4 shaft widths posterior displacement of the distal fragment. 2. No definitive pelvic fracture is identified. If there is a persistent clinical concern for nondisplaced pelvic fracture, recommend dedicated pelvic CT or MRI. Electronically Signed   By: Meda Klinefelter M.D.   On: 02/26/2023 16:56   DG Knee 1-2 Views Right  Result Date: 02/26/2023 CLINICAL DATA:  [id number] Fall 190176; fall EXAM: RIGHT KNEE - 1-2 VIEW; RIGHT FEMUR 2 VIEWS; RIGHT TIBIA AND FIBULA - 2 VIEW; PORTABLE PELVIS 1-2 VIEWS COMPARISON:  None Available. FINDINGS: Osteopenia. There is an oblique fracture of the distal femur with approximately 3/4 shaft widths posterior displacement of the distal fragment. There is apex medial angulation. Status post total knee arthroplasty. Orthopedic hardware is intact and without periprosthetic fracture or lucency. Status post RIGHT hip arthroplasty. Orthopedic  hardware is intact and without periprosthetic fracture or lucency. Sacrum is obscured by overlapping bowel contents. No definitive pelvic fracture is identified. Status post LEFT hip arthroplasty. Degenerative changes of the lower lumbar spine. Enthesopathic changes of the plantar calcaneus and Achilles tendon. Mild midfoot degenerative changes. No additional fractures identified IMPRESSION: 1. There is an oblique angulated fracture of the distal femur with approximately 3/4 shaft widths posterior displacement of the distal fragment. 2. No definitive pelvic fracture is identified. If there is a persistent clinical concern for nondisplaced pelvic fracture, recommend dedicated pelvic CT or MRI. Electronically Signed   By: Meda Klinefelter M.D.   On: 02/26/2023 16:56    Review of Systems  HENT:  Negative for ear discharge, ear pain, hearing loss and tinnitus.   Eyes:  Negative for photophobia and pain.  Respiratory:  Negative for cough and shortness of breath.   Cardiovascular:  Negative for chest pain.  Gastrointestinal:  Negative for abdominal pain, nausea and vomiting.  Genitourinary:  Negative for dysuria, flank pain, frequency and urgency.  Musculoskeletal:  Positive for arthralgias (Right leg). Negative for back pain, myalgias and neck pain.  Neurological:  Negative for dizziness and headaches.  Hematological:  Does not bruise/bleed easily.  Psychiatric/Behavioral:  The patient is not nervous/anxious.    Blood pressure 130/66, pulse 92, temperature 98.2 F (36.8 C), temperature source Oral, resp. rate 18, height 5\' 5"  (1.651 m), weight 81.6 kg, SpO2 92%. Physical Exam Constitutional:      General: She is not in acute distress.    Appearance: She is well-developed. She is not diaphoretic.  HENT:     Head: Normocephalic and atraumatic.  Eyes:     General: No scleral icterus.       Right eye: No discharge.        Left eye: No discharge.     Conjunctiva/sclera: Conjunctivae normal.   Cardiovascular:     Rate and Rhythm: Normal rate and regular rhythm.  Pulmonary:     Effort: Pulmonary effort is normal. No respiratory distress.  Musculoskeletal:     Cervical back: Normal range of motion.     Comments: RLE No traumatic wounds, ecchymosis, or rash  Mod TTP thigh  No knee  or ankle effusion  Knee stable to varus/ valgus and anterior/posterior stress  Sens DPN, SPN, TN intact  Motor EHL, ext, flex, evers 5/5  DP 2+, PT 1+, No significant edema  Skin:    General: Skin is warm and dry.  Neurological:     Mental Status: She is alert.  Psychiatric:        Mood and Affect: Mood normal.        Behavior: Behavior normal.     Assessment/Plan: Right femur fx -- Plan ORIF today with Dr. Jena Gauss. Please keep NPO.    Freeman Caldron, PA-C Orthopedic Surgery [phone] 02/28/2023, 12:00 PM

## 2023-02-28 NOTE — Anesthesia Preprocedure Evaluation (Addendum)
Anesthesia Evaluation  Patient identified by MRN, date of birth, ID band Patient awake    Reviewed: Allergy & Precautions, NPO status , Patient's Chart, lab work & pertinent test results  Airway Mallampati: II  TM Distance: >3 FB Neck ROM: Full    Dental  (+) Chipped, Dental Advisory Given,    Pulmonary pneumonia   Pulmonary exam normal breath sounds clear to auscultation       Cardiovascular hypertension, Pt. on medications and Pt. on home beta blockers pulmonary hypertension (mild pHTN on echo 2018)+CHF  Normal cardiovascular exam+ dysrhythmias (coumadin- LD 3/27) Atrial Fibrillation + Valvular Problems/Murmurs (mild MR on echo 2018) MR  Rhythm:Irregular Rate:Normal  Echo 2018 - Left ventricle: The cavity size was normal. There was mild concentric hypertrophy. Systolic function was normal. The estimated ejection fraction was in the range of 55% to 60%. Wall motion was normal; there were no regional wall motion abnormalities.  - Aortic valve: Transvalvular velocity was within the normal range. There was no stenosis. There was no regurgitation.  - Mitral valve: Transvalvular velocity was within the normal range. There was no evidence for stenosis. There was mild regurgitation.  - Left atrium: The atrium was severely dilated.  - Right ventricle: The cavity size was normal. Wall thickness was normal. Systolic function was normal.  - Right atrium: The atrium was moderately dilated.  - Tricuspid valve: There was mild regurgitation.  - Pulmonary arteries: Systolic pressure was mildly increased. PA peak pressure: 48 mm Hg (S).     Neuro/Psych  Headaches PSYCHIATRIC DISORDERS Anxiety Depression       GI/Hepatic Neg liver ROS, hiatal hernia,GERD  Controlled,,  Endo/Other  negative endocrine ROS    Renal/GU negative Renal ROS     Musculoskeletal  (+) Arthritis , Osteoarthritis,    Abdominal Normal abdominal exam  (+)   Peds   Hematology negative hematology ROS (+) Lab Results      Component                Value               Date                      WBC                      7.4                 02/28/2023                HGB                      10.4 (L)            02/28/2023                HCT                      32.1 (L)            02/28/2023                MCV                      96.7                02/28/2023                PLT  217                 02/28/2023             Lab Results      Component                Value               Date                      NA                       137                 02/28/2023                K                        4.1                 02/28/2023                CO2                      26                  02/28/2023                GLUCOSE                  133 (H)             02/28/2023                BUN                      32 (H)              02/28/2023                CREATININE               1.26 (H)            02/28/2023                CALCIUM                  8.6 (L)             02/28/2023                GFRNONAA                 44 (L)              02/28/2023             Lab Results      Component                Value               Date                      INR                      2.8 (H)             02/28/2023  INR                      2.5 (H)             02/27/2023                INR                      3.0 (H)             02/26/2023              Anesthesia Other Findings   Reproductive/Obstetrics negative OB ROS                              Anesthesia Physical Anesthesia Plan  ASA: 3  Anesthesia Plan: General   Post-op Pain Management: Regional block* and Tylenol PO (pre-op)*   Induction: Intravenous  PONV Risk Score and Plan: 2 and Ondansetron, Dexamethasone and Treatment may vary due to age or medical condition  Airway Management Planned: Oral ETT  Additional Equipment:  None  Intra-op Plan:   Post-operative Plan: Extubation in OR  Informed Consent: I have reviewed the patients History and Physical, chart, labs and discussed the procedure including the risks, benefits and alternatives for the proposed anesthesia with the patient or authorized representative who has indicated his/her understanding and acceptance.     Dental advisory given  Plan Discussed with: CRNA  Anesthesia Plan Comments: (No spinal as INR 2.8. Pt had crackers with meds this morning. Delay for ~6 hours. )        Anesthesia Quick Evaluation

## 2023-02-28 NOTE — Progress Notes (Signed)
OT Cancellation Note  Patient Details Name: Sierra [name] MRN: [id number] DOB: May 10, 1946   Cancelled Treatment:    Reason Eval/Treat Not Completed: Active bedrest order (Continues to await ORIF)  Donia Pounds 02/28/2023, 8:06 AM

## 2023-02-28 NOTE — Progress Notes (Signed)
PT Cancellation Note  Patient Details Name: Sierra [name] MRN: [id number] DOB: 03-Sep-1945   Cancelled Treatment:    Reason Eval/Treat Not Completed: Patient not medically ready (pt remains on bedrest per ortho awaiting ORIF for femur fx)   Aroura Vasudevan B Dylen Mcelhannon 02/28/2023, 8:01 AM Merryl Hacker, PT Acute Rehabilitation Services Office: [phone]

## 2023-02-28 NOTE — Progress Notes (Signed)
Report received for patient coming to surgery. Nurse stated pt had been NPO. Patient told me she had not had anything since dinner the night before as was prepped as normal. When anesthesia came to see pt, she told Dr. Renold Don she remembered she had crackers with her meds this morning. I went in to verify with patient. She states she did not want to take her meds on an empty stomach and had 2 saltine crackers but wasn't sure what time. Meds were charted as given in [facility] around 0910. I called the floor nurse and asked if she was aware the patient had crackers. Nurse stated "yes she had saltines with her meds this morning." I explained to the nurse that this was not considered NPO and this would delay her surgery. I verified exactly what time the crackers were given and the nurse stated the same time as her morning meds. I verified approximately [phone] and nurse confirmed. Dr. Renold Don and Haddix made aware. Pt was required a 6-hour delay from intake, surgery pushed to 1515.

## 2023-02-28 NOTE — Consult Note (Signed)
Reason for Consult:Right femur fx Referring Physician: Levert Feinstein Time called: 1102 Time at bedside: 1117   Sierra [name] is an 77 y.o. female.  HPI: [name] tripped on a chair leg at home that caused her to lose balance and fall. She had immediate right leg pain and could not get up. She was brought to the ED where x-rays showed a periprosthetic femur fx and orthopedic surgery was consulted. She lives at home with her husband and uses either a RW or cane to ambulate.  Past Medical History:  Diagnosis Date   Anxiety    Arthritis    "joints" (09/19/2013)   Atrial fibrillation (HCC)    Basal cell carcinoma of cheek    left   CHF (congestive heart failure) (HCC)    Chronic lower back pain    Dysrhythmia    GERD (gastroesophageal reflux disease)    H/O hiatal hernia    Hypertension    Migraine    "haven't had any in a long time" (09/19/2013)   Pneumonia ~ 2012    Past Surgical History:  Procedure Laterality Date   BASAL CELL CARCINOMA EXCISION Left    "cheek"   CARDIOVERSION N/A 07/15/2017   Procedure: CARDIOVERSION;  Surgeon: Vesta Mixer, MD;  Location: [facility] ENDOSCOPY;  Service: Cardiovascular;  Laterality: N/A;   CHOLECYSTECTOMY  02/19/1989   COLONOSCOPY WITH PROPOFOL N/A 07/05/2016   Procedure: COLONOSCOPY WITH PROPOFOL;  Surgeon: Charolett Bumpers, MD;  Location: [facility] ENDOSCOPY;  Service: Endoscopy;  Laterality: N/A;   DILATION AND CURETTAGE OF UTERUS     HERNIA REPAIR  06/21/2009   umbilical   JOINT REPLACEMENT     MOHS SURGERY     OOPHORECTOMY Bilateral    REVERSE SHOULDER ARTHROPLASTY Left 03/11/2022   Procedure: REVERSE SHOULDER ARTHROPLASTY;  Surgeon: Jones Broom, MD;  Location: [facility] ORS;  Service: Orthopedics;  Laterality: Left;   REVISION TOTAL HIP ARTHROPLASTY Left 09/19/2013   SHOULDER ARTHROSCOPY W/ ROTATOR CUFF REPAIR Right    TEMPOROMANDIBULAR JOINT SURGERY     TMJ ARTHROPLASTY     TOTAL HIP ARTHROPLASTY Left 06/22/2007   TOTAL HIP ARTHROPLASTY Right  06/21/2009   TOTAL HIP REVISION Left 09/19/2013   Procedure: LEFT TOTAL HIP REVISION;  Surgeon: Nestor Lewandowsky, MD;  Location: [facility] OR;  Service: Orthopedics;  Laterality: Left;   TOTAL KNEE ARTHROPLASTY Right 09/21/2022   Procedure: RIGHT TOTAL KNEE ARTHROPLASTY;  Surgeon: Marcene Corning, MD;  Location: [facility] ORS;  Service: Orthopedics;  Laterality: Right;   TUBAL LIGATION     VAGINAL HYSTERECTOMY  02/19/1989    Family History  Problem Relation Age of Onset   Heart attack Mother    Heart attack Maternal Aunt    Heart attack Paternal Uncle     Social History:  reports that she has never smoked. She has never used smokeless tobacco. She reports current alcohol use. She reports that she does not use drugs.  Allergies:  Allergies  Allergen Reactions   Ace Inhibitors Cough   Codeine Nausea Only     GI Intolerance   Erythromycin Other (See Comments)    Stomach pain    Iohexol Hives     Code: HIVES, Desc: patient states develops hives w/ iv contrast/mms    Iodinated Contrast Media Other (See Comments)    Hives    Medications: I have reviewed the patient's current medications.  Results for orders placed or performed during the hospital encounter of 02/26/23 (from the past 48 hour(s))  CBC with  Differential     Status: Abnormal   Collection Time: 02/26/23  3:28 PM  Result Value Ref Range   WBC 8.4 4.0 - 10.5 K/uL   RBC 3.64 (L) 3.87 - 5.11 MIL/uL   Hemoglobin 11.5 (L) 12.0 - 15.0 g/dL   HCT 40.9 (L) 81.1 - 91.4 %   MCV 94.8 80.0 - 100.0 fL   MCH 31.6 26.0 - 34.0 pg   MCHC 33.3 30.0 - 36.0 g/dL   RDW 78.2 95.6 - 21.3 %   Platelets 205 150 - 400 K/uL   nRBC 0.0 0.0 - 0.2 %   Neutrophils Relative % 87 %   Neutro Abs 7.4 1.7 - 7.7 K/uL   Lymphocytes Relative 6 %   Lymphs Abs 0.5 (L) 0.7 - 4.0 K/uL   Monocytes Relative 5 %   Monocytes Absolute 0.4 0.1 - 1.0 K/uL   Eosinophils Relative 1 %   Eosinophils Absolute 0.0 0.0 - 0.5 K/uL   Basophils Relative 1 %   Basophils Absolute  0.0 0.0 - 0.1 K/uL   Immature Granulocytes 0 %   Abs Immature Granulocytes 0.02 0.00 - 0.07 K/uL    Comment: Performed at [facility], [address], [place], [place] [address]  Comprehensive metabolic panel     Status: Abnormal   Collection Time: 02/26/23  3:28 PM  Result Value Ref Range   Sodium 138 135 - 145 mmol/L   Potassium 3.3 (L) 3.5 - 5.1 mmol/L   Chloride 105 98 - 111 mmol/L   CO2 22 22 - 32 mmol/L   Glucose, Bld 140 (H) 70 - 99 mg/dL    Comment: Glucose reference range applies only to samples taken after fasting for at least 8 hours.   BUN 28 (H) 8 - 23 mg/dL   Creatinine, Ser 8.46 0.44 - 1.00 mg/dL   Calcium 8.7 (L) 8.9 - 10.3 mg/dL   Total Protein 7.1 6.5 - 8.1 g/dL   Albumin 4.1 3.5 - 5.0 g/dL   AST 26 15 - 41 U/L   ALT 19 0 - 44 U/L   Alkaline Phosphatase 82 38 - 126 U/L   Total Bilirubin 0.9 0.3 - 1.2 mg/dL   GFR, Estimated >96 >29 mL/min    Comment: (NOTE) Calculated using the CKD-EPI Creatinine Equation (2021)    Anion gap 11 5 - 15    Comment: Performed at [facility], [address] [place], [place] [address]  Protime-INR     Status: Abnormal   Collection Time: 02/26/23  3:28 PM  Result Value Ref Range   Prothrombin Time 31.3 (H) 11.4 - 15.2 seconds   INR 3.0 (H) 0.8 - 1.2    Comment: (NOTE) INR goal varies based on device and disease states. Performed at [facility], [address] [place], [place] [address]   Basic metabolic panel     Status: Abnormal   Collection Time: 02/27/23  7:42 AM  Result Value Ref Range   Sodium 140 135 - 145 mmol/L   Potassium 4.1 3.5 - 5.1 mmol/L   Chloride 102 98 - 111 mmol/L   CO2 25 22 - 32 mmol/L   Glucose, Bld 127 (H) 70 - 99 mg/dL    Comment: Glucose reference range applies only to samples taken after fasting for at least 8 hours.   BUN 27 (H) 8 - 23 mg/dL   Creatinine, Ser 1.02 0.44 - 1.00 mg/dL   Calcium 9.0 8.9 - 72.5 mg/dL  GFR, Estimated >60 >60 mL/min    Comment: (NOTE) Calculated  using the CKD-EPI Creatinine Equation (2021)    Anion gap 13 5 - 15    Comment: Performed at [facility], [address] [place], [place] [address]  CBC with Differential/Platelet     Status: Abnormal   Collection Time: 02/27/23  7:42 AM  Result Value Ref Range   WBC 7.0 4.0 - 10.5 K/uL   RBC 3.38 (L) 3.87 - 5.11 MIL/uL   Hemoglobin 10.7 (L) 12.0 - 15.0 g/dL   HCT 52.8 (L) 41.3 - 24.4 %   MCV 94.7 80.0 - 100.0 fL   MCH 31.7 26.0 - 34.0 pg   MCHC 33.4 30.0 - 36.0 g/dL   RDW 01.0 27.2 - 53.6 %   Platelets 221 150 - 400 K/uL   nRBC 0.0 0.0 - 0.2 %   Neutrophils Relative % 70 %   Neutro Abs 4.9 1.7 - 7.7 K/uL   Lymphocytes Relative 16 %   Lymphs Abs 1.1 0.7 - 4.0 K/uL   Monocytes Relative 12 %   Monocytes Absolute 0.9 0.1 - 1.0 K/uL   Eosinophils Relative 1 %   Eosinophils Absolute 0.1 0.0 - 0.5 K/uL   Basophils Relative 1 %   Basophils Absolute 0.0 0.0 - 0.1 K/uL   Immature Granulocytes 0 %   Abs Immature Granulocytes 0.02 0.00 - 0.07 K/uL    Comment: Performed at [facility], [address] [place], [place] [address]  Protime-INR     Status: Abnormal   Collection Time: 02/27/23  7:42 AM  Result Value Ref Range   Prothrombin Time 27.4 (H) 11.4 - 15.2 seconds   INR 2.5 (H) 0.8 - 1.2    Comment: (NOTE) INR goal varies based on device and disease states. Performed at [facility], [address] [place], [place] [address]   Magnesium     Status: None   Collection Time: 02/27/23  7:42 AM  Result Value Ref Range   Magnesium 2.1 1.7 - 2.4 mg/dL    Comment: Performed at [facility], [address] [place], [place] [address]  CBC     Status: Abnormal   Collection Time: 02/28/23  9:47 AM  Result Value Ref Range   WBC 7.4 4.0 - 10.5 K/uL   RBC 3.32 (L) 3.87 - 5.11 MIL/uL   Hemoglobin 10.4 (L) 12.0 - 15.0 g/dL   HCT 75.6 (L) 43.3 - 29.5 %   MCV 96.7 80.0 - 100.0 fL   MCH 31.3 26.0 - 34.0 pg   MCHC 32.4 30.0 - 36.0 g/dL   RDW 18.8 41.6 - 60.6 %    Platelets 217 150 - 400 K/uL   nRBC 0.0 0.0 - 0.2 %    Comment: Performed at [facility], [address] [place], [place] [address]  Basic metabolic panel     Status: Abnormal   Collection Time: 02/28/23  9:47 AM  Result Value Ref Range   Sodium 137 135 - 145 mmol/L   Potassium 4.1 3.5 - 5.1 mmol/L   Chloride 100 98 - 111 mmol/L   CO2 26 22 - 32 mmol/L   Glucose, Bld 133 (H) 70 - 99 mg/dL    Comment: Glucose reference range applies only to samples taken after fasting for at least 8 hours.   BUN 32 (H) 8 - 23 mg/dL   Creatinine, Ser 1.09 (H) 0.44 - 1.00 mg/dL   Calcium  8.6 (L) 8.9 - 10.3 mg/dL   GFR, Estimated 44 (L) >60 mL/min    Comment: (NOTE) Calculated using the CKD-EPI Creatinine Equation (2021)    Anion gap 11 5 - 15    Comment: Performed at [facility], [address] [place], [place] [address]  Protime-INR     Status: Abnormal   Collection Time: 02/28/23 10:35 AM  Result Value Ref Range   Prothrombin Time 30.0 (H) 11.4 - 15.2 seconds   INR 2.8 (H) 0.8 - 1.2    Comment: (NOTE) INR goal varies based on device and disease states. Performed at [facility], [address], [place], [place] [address]     DG Femur Min 2 Views Right  Result Date: 02/26/2023 CLINICAL DATA:  [id number] Fall 190176; fall EXAM: RIGHT KNEE - 1-2 VIEW; RIGHT FEMUR 2 VIEWS; RIGHT TIBIA AND FIBULA - 2 VIEW; PORTABLE PELVIS 1-2 VIEWS COMPARISON:  None Available. FINDINGS: Osteopenia. There is an oblique fracture of the distal femur with approximately 3/4 shaft widths posterior displacement of the distal fragment. There is apex medial angulation. Status post total knee arthroplasty. Orthopedic hardware is intact and without periprosthetic fracture or lucency. Status post RIGHT hip arthroplasty. Orthopedic hardware is intact and without periprosthetic fracture or lucency. Sacrum is obscured by overlapping bowel contents. No definitive pelvic fracture is identified. Status post LEFT hip  arthroplasty. Degenerative changes of the lower lumbar spine. Enthesopathic changes of the plantar calcaneus and Achilles tendon. Mild midfoot degenerative changes. No additional fractures identified IMPRESSION: 1. There is an oblique angulated fracture of the distal femur with approximately 3/4 shaft widths posterior displacement of the distal fragment. 2. No definitive pelvic fracture is identified. If there is a persistent clinical concern for nondisplaced pelvic fracture, recommend dedicated pelvic CT or MRI. Electronically Signed   By: Meda Klinefelter M.D.   On: 02/26/2023 16:56   DG Tibia/Fibula Right  Result Date: 02/26/2023 CLINICAL DATA:  [id number] Fall 190176; fall EXAM: RIGHT KNEE - 1-2 VIEW; RIGHT FEMUR 2 VIEWS; RIGHT TIBIA AND FIBULA - 2 VIEW; PORTABLE PELVIS 1-2 VIEWS COMPARISON:  None Available. FINDINGS: Osteopenia. There is an oblique fracture of the distal femur with approximately 3/4 shaft widths posterior displacement of the distal fragment. There is apex medial angulation. Status post total knee arthroplasty. Orthopedic hardware is intact and without periprosthetic fracture or lucency. Status post RIGHT hip arthroplasty. Orthopedic hardware is intact and without periprosthetic fracture or lucency. Sacrum is obscured by overlapping bowel contents. No definitive pelvic fracture is identified. Status post LEFT hip arthroplasty. Degenerative changes of the lower lumbar spine. Enthesopathic changes of the plantar calcaneus and Achilles tendon. Mild midfoot degenerative changes. No additional fractures identified IMPRESSION: 1. There is an oblique angulated fracture of the distal femur with approximately 3/4 shaft widths posterior displacement of the distal fragment. 2. No definitive pelvic fracture is identified. If there is a persistent clinical concern for nondisplaced pelvic fracture, recommend dedicated pelvic CT or MRI. Electronically Signed   By: Meda Klinefelter M.D.   On: 02/26/2023  16:56   DG Pelvis Portable  Result Date: 02/26/2023 CLINICAL DATA:  [id number] Fall 190176; fall EXAM: RIGHT KNEE - 1-2 VIEW; RIGHT FEMUR 2 VIEWS; RIGHT TIBIA AND FIBULA - 2 VIEW; PORTABLE PELVIS 1-2 VIEWS COMPARISON:  None Available. FINDINGS: Osteopenia. There is an oblique fracture of the distal femur with approximately 3/4 shaft widths posterior displacement of the distal fragment. There is apex medial angulation. Status post total knee arthroplasty. Orthopedic  hardware is intact and without periprosthetic fracture or lucency. Status post RIGHT hip arthroplasty. Orthopedic hardware is intact and without periprosthetic fracture or lucency. Sacrum is obscured by overlapping bowel contents. No definitive pelvic fracture is identified. Status post LEFT hip arthroplasty. Degenerative changes of the lower lumbar spine. Enthesopathic changes of the plantar calcaneus and Achilles tendon. Mild midfoot degenerative changes. No additional fractures identified IMPRESSION: 1. There is an oblique angulated fracture of the distal femur with approximately 3/4 shaft widths posterior displacement of the distal fragment. 2. No definitive pelvic fracture is identified. If there is a persistent clinical concern for nondisplaced pelvic fracture, recommend dedicated pelvic CT or MRI. Electronically Signed   By: Meda Klinefelter M.D.   On: 02/26/2023 16:56   DG Knee 1-2 Views Right  Result Date: 02/26/2023 CLINICAL DATA:  [id number] Fall 190176; fall EXAM: RIGHT KNEE - 1-2 VIEW; RIGHT FEMUR 2 VIEWS; RIGHT TIBIA AND FIBULA - 2 VIEW; PORTABLE PELVIS 1-2 VIEWS COMPARISON:  None Available. FINDINGS: Osteopenia. There is an oblique fracture of the distal femur with approximately 3/4 shaft widths posterior displacement of the distal fragment. There is apex medial angulation. Status post total knee arthroplasty. Orthopedic hardware is intact and without periprosthetic fracture or lucency. Status post RIGHT hip arthroplasty. Orthopedic  hardware is intact and without periprosthetic fracture or lucency. Sacrum is obscured by overlapping bowel contents. No definitive pelvic fracture is identified. Status post LEFT hip arthroplasty. Degenerative changes of the lower lumbar spine. Enthesopathic changes of the plantar calcaneus and Achilles tendon. Mild midfoot degenerative changes. No additional fractures identified IMPRESSION: 1. There is an oblique angulated fracture of the distal femur with approximately 3/4 shaft widths posterior displacement of the distal fragment. 2. No definitive pelvic fracture is identified. If there is a persistent clinical concern for nondisplaced pelvic fracture, recommend dedicated pelvic CT or MRI. Electronically Signed   By: Meda Klinefelter M.D.   On: 02/26/2023 16:56    Review of Systems  HENT:  Negative for ear discharge, ear pain, hearing loss and tinnitus.   Eyes:  Negative for photophobia and pain.  Respiratory:  Negative for cough and shortness of breath.   Cardiovascular:  Negative for chest pain.  Gastrointestinal:  Negative for abdominal pain, nausea and vomiting.  Genitourinary:  Negative for dysuria, flank pain, frequency and urgency.  Musculoskeletal:  Positive for arthralgias (Right leg). Negative for back pain, myalgias and neck pain.  Neurological:  Negative for dizziness and headaches.  Hematological:  Does not bruise/bleed easily.  Psychiatric/Behavioral:  The patient is not nervous/anxious.    Blood pressure 130/66, pulse 92, temperature 98.2 F (36.8 C), temperature source Oral, resp. rate 18, height 5\' 5"  (1.651 m), weight 81.6 kg, SpO2 92%. Physical Exam Constitutional:      General: She is not in acute distress.    Appearance: She is well-developed. She is not diaphoretic.  HENT:     Head: Normocephalic and atraumatic.  Eyes:     General: No scleral icterus.       Right eye: No discharge.        Left eye: No discharge.     Conjunctiva/sclera: Conjunctivae normal.   Cardiovascular:     Rate and Rhythm: Normal rate and regular rhythm.  Pulmonary:     Effort: Pulmonary effort is normal. No respiratory distress.  Musculoskeletal:     Cervical back: Normal range of motion.     Comments: RLE No traumatic wounds, ecchymosis, or rash  Mod TTP thigh  No knee  or ankle effusion  Knee stable to varus/ valgus and anterior/posterior stress  Sens DPN, SPN, TN intact  Motor EHL, ext, flex, evers 5/5  DP 2+, PT 1+, No significant edema  Skin:    General: Skin is warm and dry.  Neurological:     Mental Status: She is alert.  Psychiatric:        Mood and Affect: Mood normal.        Behavior: Behavior normal.     Assessment/Plan: Right femur fx -- Plan ORIF today with Dr. Jena Gauss. Please keep NPO.    Freeman Caldron, PA-C Orthopedic Surgery [phone] 02/28/2023, 12:00 PM

## 2023-02-28 NOTE — Anesthesia Procedure Notes (Signed)
Procedure Name: Intubation Date/Time: 02/28/2023 3:12 PM  Performed by: Atilano Median, DOPre-anesthesia Checklist: Patient identified, Emergency Drugs available, Suction available and Patient being monitored Patient Re-evaluated:Patient Re-evaluated prior to induction Oxygen Delivery Method: Circle System Utilized Preoxygenation: Pre-oxygenation with 100% oxygen Induction Type: IV induction Ventilation: Mask ventilation without difficulty Laryngoscope Size: Miller and 2 Grade View: Grade I Tube type: Oral Number of attempts: 1 Airway Equipment and Method: Stylet and Oral airway Placement Confirmation: ETT inserted through vocal cords under direct vision, positive ETCO2 and breath sounds checked- equal and bilateral Secured at: 21 cm Tube secured with: Tape Dental Injury: Teeth and Oropharynx as per pre-operative assessment

## 2023-02-28 NOTE — Assessment & Plan Note (Signed)
Holding Coumadin prior to surgery, INR 2.8 this AM. Discuss VTE ppx with surgery

## 2023-02-28 NOTE — Op Note (Signed)
Orthopaedic Surgery Operative Note (CSN: [id number] ) Date of Surgery: 02/28/2023  Admit Date: 02/26/2023   Diagnoses: Pre-Op Diagnoses: Right periprosthetic distal femur/femoral shaft fracture  Post-Op Diagnosis: Same   Procedures: CPT 27511-Open reduction internal fixation of right periprosthetic distal femur fracture  Surgeons : Primary: Roby Lofts, MD  Assistant: Ulyses Southward, PA-C  Location: OR 8   Anesthesia: General   Antibiotics: Ancef 2g preop with 1 gm vancomycin powder placed topically   Tourniquet time: None    Estimated Blood Loss: 100 mL  Complications: None   Specimens: None   Implants: Implant Name Type Inv. Item Serial No. Manufacturer Lot No. LRB No. Used Action  PLATE DISTAL FEMUR 15H 317M RT - [id number] Plate PLATE DISTAL FEMUR 15H 317M RT  [employer](ORTH,TRAU,BIO,SG)  Right 1 Implanted  SCREW NCB 5.0X34MM - [id number] Screw SCREW NCB 5.0X34MM  [employer](ORTH,TRAU,BIO,SG)  Right 2 Implanted  SCREW CORTICAL NCB 5.0X40 - [id number] Screw SCREW CORTICAL NCB 5.0X40  [employer](ORTH,TRAU,BIO,SG)  Right 1 Implanted  SCREW NCB 5.0X75MM - [id number] Screw SCREW NCB 5.0X75MM  [employer](ORTH,TRAU,BIO,SG)  Right 2 Implanted  SCREW 5.0 - [id number] Screw SCREW 5.0  [employer](ORTH,TRAU,BIO,SG)  Right 3 Implanted  SCREW NCB 4.0MX30M - [id number] Screw SCREW NCB 4.0MX30M  [employer](ORTH,TRAU,BIO,SG)  Right 1 Implanted  SCREW NCB 4.0MX34M - [id number] Screw SCREW NCB 4.0MX34M  [employer](ORTH,TRAU,BIO,SG)  Right 1 Implanted  SCREW NCB 4.0X36MM - [id number] Screw SCREW NCB 4.0X36MM  [employer](ORTH,TRAU,BIO,SG)  Right 1 Implanted  CAP LOCK NCB - [id number] Cap CAP LOCK NCB  [employer](ORTH,TRAU,BIO,SG)  Right 8 Implanted     Indications for Surgery: 77 year old female who sustained a ground-level fall with a right intra prosthetic femoral shaft/distal femur fracture.  Due to the unstable nature of her injury I recommend proceeding  with open reduction internal fixation.  Risks and benefits were discussed with the patient and her husband.  Risks included but not limited to bleeding, infection, malunion, nonunion, hardware failure, hardware irritation, nerve and blood vessel injury, DVT, even the possibility anesthetic complications.  She agreed to proceed with surgery and consent was obtained.  Operative Findings: Open reduction internal fixation of right intra prosthetic femoral shaft/distal femur fracture using [employer] NCB distal femoral locking plate.  Procedure: The patient was identified in the preoperative holding area. Consent was confirmed with the patient and their family and all questions were answered. The operative extremity was marked after confirmation with the patient. she was then brought back to the operating room by our anesthesia colleagues.  She was placed under general anesthetic and carefully transferred over to radiolucent flattop table.  A bump was placed under her operative hip.  The right lower extremity was then prepped and draped in usual sterile fashion.  A timeout was performed to verify the patient, the procedure, and the extremity.  Preoperative antibiotics were dosed.  Fluoroscopic imaging was used to assist show the unstable nature of her injury.  The hip and knee were flexed over a triangle.  Alignment was maintained in a lateral approach to the distal femur was made and carried down through skin and subcutaneous tissue.  I incised through the IT band and expose the lateral condyle of the distal femur.  I then chose a 15 hole [employer] NCB distal femoral locking plate.  I attached this to the targeting arm and slid submuscularly along the lateral cortex of the femur.  I aligned the distal portion of plate to the bone and  placed a 2.0 mm K wire.  I then percutaneously placed a 3.3 mm drill bit distal to the prosthesis to align the proximal portion of the plate.  I then returned to the distal  segment and placed a 5.0 millimeter screws to bring the plate flush to bone.  I then percutaneously placed a mixture of 4.0 and 5.0 millimeter screws in the femoral shaft.  I was able to get purchase around the femoral prosthesis.  Locking screws were placed on all of the 4.0 millimeter screws in the femoral shaft and none on the 5.0 millimeter screws.  I then placed a 5.0 millimeter screw in the distal shaft segment to align the distal portion of the fracture.  I then returned to the distal segment right above the knee replacement and placed a total of five 5.0 millimeter screws.  Locking caps were placed in all these distal screws.  The targeting arm was removed and final fluoroscopic imaging was obtained.  The incision was copiously irrigated.  A gram of vancomycin powder was placed to the incision.  A layered closure of 0 Vicryl, 2-0 Monocryl and 3-0 Monocryl was used to close the skin with Dermabond to seal the skin.  Sterile dressings were applied.  The patient was then awoke from anesthesia and taken to the PACU in stable condition.  Post Op Plan/Instructions: Patient will be weightbearing as tolerated to the right lower extremity.  She will receive postoperative Ancef.  She may be restarted on her Coumadin tomorrow.  We will have her mobilize with physical and Occupational Therapy.  If her Coumadin is below 2 and she needs to restart the heparin I am okay starting that at 6 AM tomorrow morning.  I was present and performed the entire surgery.  Ulyses Southward, PA-C did assist me throughout the case. An assistant was necessary given the difficulty in approach, maintenance of reduction and ability to instrument the fracture.   Truitt Merle, MD [facility]

## 2023-02-28 NOTE — Interval H&P Note (Signed)
History and Physical Interval Note:  02/28/2023 12:59 PM  Sierra [name]  has presented today for surgery, with the diagnosis of RIGHT FEMUR FRACTURE.  The various methods of treatment have been discussed with the patient and family. After consideration of risks, benefits and other options for treatment, the patient has consented to  Procedure(s): OPEN REDUCTION INTERNAL FIXATION (ORIF) DISTAL FEMUR FRACTURE (Right) as a surgical intervention.  The patient's history has been reviewed, patient examined, no change in status, stable for surgery.  I have reviewed the patient's chart and labs.  Questions were answered to the patient's satisfaction.     Caryn Bee P Sanvi Ehler

## 2023-02-28 NOTE — Assessment & Plan Note (Addendum)
Pain controlled with regimen below. S/p ORIF 9/9 with orthopedics.  Surgical risk overall above average as per H&P.  Previously medically optimized for surgery from cardiac standpoint in April 2024. -Orthopedics following, appreciate recommendations -Consider further cardiac optimization with updated echo (last 2018) -Pain control with Dilaudid 1 mg every 3, scheduled Tylenol 650 every 6 -PT/OT once able -Regular diet for now, will need to be n.p.o. Monday morning 9/9 at 12 AM -Follow up AM labs: CBC, BMP, PT INR

## 2023-02-28 NOTE — Anesthesia Procedure Notes (Signed)
Anesthesia Regional Block: Adductor canal block   Pre-Anesthetic Checklist: , timeout performed,  Correct Patient, Correct Site, Correct Laterality,  Correct Procedure, Correct Position, site marked,  Risks and benefits discussed,  Surgical consent,  Pre-op evaluation,  At surgeon's request and post-op pain management  Laterality: Right  Prep: Dura Prep       Needles:  Injection technique: Single-shot  Needle Type: Echogenic Stimulator Needle     Needle Length: 10cm  Needle Gauge: 20     Additional Needles:   Procedures:,,,, ultrasound used (permanent image in chart),,    Narrative:  Start time: 02/28/2023 2:20 PM End time: 02/28/2023 2:24 PM Injection made incrementally with aspirations every 5 mL.  Performed by: Personally  Anesthesiologist: Atilano Median, DO  Additional Notes: Patient identified. Risks/Benefits/Options discussed with patient including but not limited to bleeding, infection, nerve damage, failed block, incomplete pain control. Patient expressed understanding and wished to proceed. All questions were answered. Sterile technique was used throughout the entire procedure. Please see nursing notes for vital signs. Aspirated in 5cc intervals with injection for negative confirmation. Patient was given instructions on fall risk and not to get out of bed. All questions and concerns addressed with instructions to call with any issues or inadequate analgesia.

## 2023-02-28 NOTE — Transfer of Care (Signed)
  Immediate Anesthesia Transfer of Care Note  Patient: Sierra [name]  Procedure(s) Performed: OPEN REDUCTION INTERNAL FIXATION (ORIF) DISTAL FEMUR FRACTURE (Right)  Patient Location: PACU  Anesthesia Type:General  Level of Consciousness: drowsy  Airway & Oxygen Therapy: Patient connected to nasal cannula oxygen  Post-op Assessment: Report given to RN and Post -op Vital signs reviewed and stable  Post vital signs: Reviewed and stable  Last Vitals:  Vitals Value Taken Time  BP 148/71 02/28/23 1638  Temp    Pulse 87 02/28/23 1641  Resp 19 02/28/23 1641  SpO2 96 % 02/28/23 1641  Vitals shown include unfiled device data.  Last Pain:  Vitals:   02/28/23 1315  TempSrc: Oral  PainSc:          Complications: No notable events documented.

## 2023-02-28 NOTE — Progress Notes (Signed)
Patient told anesthesia that she had crackers with her medications this morning but wasn't sure what time. I called the floor nurse to verify. Nurse states "yes, she had two saltines with her meds around 9am ." I educated nurse that NPO patients cannot have anything but water with meds and that crackers count as food. Dr. Jena Gauss made aware. Case delayed until 1515.

## 2023-02-28 NOTE — Progress Notes (Signed)
     Daily Progress Note Intern Pager: [phone]  Patient name: Sierra [name] Medical record number: [id number] Date of birth: Aug 05, 1945 Age: 77 y.o. Gender: female  Primary Care Provider: Emilio Aspen, MD Consultants: Gaylord Shih, PT/OT Code Status: Full  Pt Overview and Major Events to Date:  02/26/23: Admitted to FPTS   Assessment and Plan:  Assessment & Plan Femur fracture (HCC) Pain controlled with regimen below. S/p ORIF 9/9 with orthopedics.  Surgical risk overall above average as per H&P.  Previously medically optimized for surgery from cardiac standpoint in April 2024. -Orthopedics following, appreciate recommendations -Consider further cardiac optimization with updated echo (last 2018) -Pain control with Dilaudid 1 mg every 3, scheduled Tylenol 650 every 6 -PT/OT once able -Regular diet for now, will need to be n.p.o. Monday morning 9/9 at 12 AM -Follow up AM labs: CBC, BMP, PT INR  Atrial fibrillation (HCC) Holding Coumadin prior to surgery, INR 2.8 this AM. Discuss VTE ppx with surgery   Chronic and Stable Conditions: Atrial fibrillation: Rate controlled, continue home metoprolol 100 BID; hold coumadin 5 mg daily Hypertension: Mildly elevated, likely due to pain, continue formulary equivalent for home valsartan and torsemide Chronic diastolic heart failure: Last Echo in 2018 EF 55-60%, continue home meds as above Depression: Continue home Celexa Allergies: Continue formula equivalent for home Allegra GERD: Continue formulary equivalent for home rabeprazole   FEN/GI: NPO>regular diet post op, miralax 17 g daily VTE Prophylaxis: Holding warfarin, no heparin due to INR 3 (can initiate if <2 per pharmacy)  Subjective:  Patient doing well post operatively. No complaints   Objective: Temp:  [97.8 F (36.6 C)-98.5 F (36.9 C)] 98.5 F (36.9 C) (09/09 0512) Pulse Rate:  [96-98] 98 (09/09 0512) Resp:  [16-18] 18 (09/09 0512) BP: (127-138)/(57-87) 138/67 (09/09  0512) SpO2:  [91 %-96 %] 91 % (09/09 0512)  General: Alert and oriented in no apparent distress Heart: Regular rate and rhythm with no murmurs appreciated Lungs: Normal WOB Skin: Warm and dry   Laboratory: Most recent CBC Lab Results  Component Value Date   WBC 7.0 02/27/2023   HGB 10.7 (L) 02/27/2023   HCT 32.0 (L) 02/27/2023   MCV 94.7 02/27/2023   PLT 221 02/27/2023   Most recent BMP    Latest Ref Rng & Units 02/27/2023    7:42 AM  BMP  Glucose 70 - 99 mg/dL 147   BUN 8 - 23 mg/dL 27   Creatinine 8.29 - 1.00 mg/dL 5.62   Sodium 130 - 865 mmol/L 140   Potassium 3.5 - 5.1 mmol/L 4.1   Chloride 98 - 111 mmol/L 102   CO2 22 - 32 mmol/L 25   Calcium 8.9 - 10.3 mg/dL 9.0      Alfredo Martinez MD PGY3 02/28/2023, 8:02 AM  PGY-3, [facility] FPTS Intern pager: [phone], text pages welcome Secure chat group [facility] Teaching Service

## 2023-02-28 NOTE — TOC CAGE-AID Note (Signed)
Transition of Care [facility]) - CAGE-AID Screening   Patient Details  Name: Sierra [name] MRN: [id number] Date of Birth: 1945/09/13  Transition of [facility]) CM/SW Contact:    Katha Hamming, RN Phone Number: 02/28/2023, 9:11 PM   Clinical Narrative:  Pt reports occasional alcohol use, no resources indicated  CAGE-AID Screening:    Have You Ever Felt You Ought to Cut Down on Your Drinking or Drug Use?: No Have People Annoyed You By Critizing Your Drinking Or Drug Use?: No Have You Felt Bad Or Guilty About Your Drinking Or Drug Use?: No Have You Ever Had a Drink or Used Drugs First Thing In The Morning to Steady Your Nerves or to Get Rid of a Hangover?: No CAGE-AID Score: 0  Substance Abuse Education Offered: No

## 2023-03-01 DIAGNOSIS — Z8781 Personal history of (healed) traumatic fracture: Secondary | ICD-10-CM

## 2023-03-01 LAB — BASIC METABOLIC PANEL
Anion gap: 10 (ref 5–15)
BUN: 33 mg/dL — ABNORMAL HIGH (ref 8–23)
CO2: 26 mmol/L (ref 22–32)
Calcium: 8.7 mg/dL — ABNORMAL LOW (ref 8.9–10.3)
Chloride: 101 mmol/L (ref 98–111)
Creatinine, Ser: 1.19 mg/dL — ABNORMAL HIGH (ref 0.44–1.00)
GFR, Estimated: 47 mL/min — ABNORMAL LOW (ref >60)
Glucose, Bld: 159 mg/dL — ABNORMAL HIGH (ref 70–99)
Potassium: 4.6 mmol/L (ref 3.5–5.1)
Sodium: 137 mmol/L (ref 135–145)

## 2023-03-01 LAB — PROTIME-INR
INR: 1.3 — ABNORMAL HIGH (ref 0.8–1.2)
Prothrombin Time: 16.8 s — ABNORMAL HIGH (ref 11.4–15.2)

## 2023-03-01 LAB — VITAMIN D 25 HYDROXY (VIT D DEFICIENCY, FRACTURES): Vit D, 25-Hydroxy: 56.03 ng/mL (ref 30–100)

## 2023-03-01 MED ORDER — ONDANSETRON HCL 4 MG PO TABS: 4.0000 mg | ORAL_TABLET | Freq: Four times a day (QID) | ORAL | Status: DC | PRN

## 2023-03-01 MED ORDER — SODIUM CHLORIDE 0.9 % IV SOLN: INTRAVENOUS | Status: DC

## 2023-03-01 MED ORDER — METOCLOPRAMIDE HCL 5 MG PO TABS: 5.0000 mg | ORAL_TABLET | Freq: Three times a day (TID) | ORAL | Status: DC | PRN

## 2023-03-01 MED ORDER — DOCUSATE SODIUM 100 MG PO CAPS
100.0000 mg | ORAL_CAPSULE | Freq: Two times a day (BID) | ORAL | Status: DC
  Administered 2023-03-01 – 2023-03-03 (×4): 100 mg via ORAL
  Filled 2023-03-01 (×5): qty 1

## 2023-03-01 MED ORDER — HYDROMORPHONE HCL 1 MG/ML IJ SOLN
0.5000 mg | INTRAMUSCULAR | Status: DC | PRN
  Administered 2023-03-01: 1 mg via INTRAVENOUS
  Filled 2023-03-01: qty 1

## 2023-03-01 MED ORDER — POLYETHYLENE GLYCOL 3350 17 G PO PACK: 17.0000 g | PACK | Freq: Every day | ORAL | Status: DC | PRN

## 2023-03-01 MED ORDER — DIPHENHYDRAMINE HCL 12.5 MG/5ML PO ELIX: 12.5000 mg | ORAL_SOLUTION | ORAL | Status: DC | PRN

## 2023-03-01 MED ORDER — WARFARIN SODIUM 5 MG PO TABS
5.0000 mg | ORAL_TABLET | Freq: Once | ORAL | Status: AC
  Administered 2023-03-01: 5 mg via ORAL
  Filled 2023-03-01: qty 1

## 2023-03-01 MED ORDER — METHOCARBAMOL 500 MG PO TABS
500.0000 mg | ORAL_TABLET | Freq: Four times a day (QID) | ORAL | Status: DC | PRN
  Administered 2023-03-03: 500 mg via ORAL
  Filled 2023-03-01: qty 1

## 2023-03-01 MED ORDER — OXYCODONE HCL 5 MG PO TABS
5.0000 mg | ORAL_TABLET | ORAL | Status: DC | PRN
  Administered 2023-03-01 – 2023-03-03 (×4): 5 mg via ORAL
  Filled 2023-03-01 (×4): qty 1

## 2023-03-01 MED ORDER — CHLORHEXIDINE GLUCONATE CLOTH 2 % EX PADS
6.0000 | MEDICATED_PAD | Freq: Every day | CUTANEOUS | Status: DC
  Administered 2023-03-01 – 2023-03-03 (×3): 6 via TOPICAL

## 2023-03-01 MED ORDER — ONDANSETRON HCL 4 MG/2ML IJ SOLN: 4.0000 mg | Freq: Four times a day (QID) | INTRAMUSCULAR | Status: DC | PRN

## 2023-03-01 MED ORDER — CEFAZOLIN SODIUM-DEXTROSE 2-4 GM/100ML-% IV SOLN
2.0000 g | Freq: Three times a day (TID) | INTRAVENOUS | Status: AC
  Administered 2023-03-01 (×3): 2 g via INTRAVENOUS
  Filled 2023-03-01 (×3): qty 100

## 2023-03-01 MED ORDER — ENOXAPARIN SODIUM 40 MG/0.4ML IJ SOSY
40.0000 mg | PREFILLED_SYRINGE | INTRAMUSCULAR | Status: DC
  Administered 2023-03-01 – 2023-03-03 (×3): 40 mg via SUBCUTANEOUS
  Filled 2023-03-01 (×3): qty 0.4

## 2023-03-01 MED ORDER — WARFARIN - PHARMACIST DOSING INPATIENT: Freq: Every day | Status: DC

## 2023-03-01 MED ORDER — TRANEXAMIC ACID-NACL 1000-0.7 MG/100ML-% IV SOLN: 1000.0000 mg | Freq: Once | INTRAVENOUS | Status: DC

## 2023-03-01 MED ORDER — METHOCARBAMOL 1000 MG/10ML IJ SOLN: 500.0000 mg | Freq: Four times a day (QID) | INTRAVENOUS | Status: DC | PRN

## 2023-03-01 MED ORDER — METOCLOPRAMIDE HCL 5 MG/ML IJ SOLN: 5.0000 mg | Freq: Three times a day (TID) | INTRAMUSCULAR | Status: DC | PRN

## 2023-03-01 NOTE — Progress Notes (Signed)
Orthopaedic Trauma Progress Note  SUBJECTIVE: Doing okay this morning.  Pain controlled.  Received some Dilaudid prior to working with therapy this AM, states her heads feels a little "swimmy" now. No chest pain. No SOB. No nausea/vomiting. No other complaints.  Tolerating diet and fluids.  OBJECTIVE:  Vitals:   03/01/23 0516 03/01/23 0802  BP: (!) 147/67 128/62  Pulse: 91 95  Resp: 18 18  Temp: 98.8 F (37.1 C) 98 F (36.7 C)  SpO2: 98% 95%    General: In bedside chair, no acute distress Respiratory: No increased work of breathing.  RLE: Dressing clean, dry, intact.  Tenderness throughout the distal thigh as expected.  Has some soreness to the calf but no areas of significant pain.  Ankle dorsiflexion/plantarflexion are intact.  Able to wiggle toes.  + EHL/FHL.  Foot warm well-perfused.  Compartment soft compressible.  Endorses sensation to light touch throughout all aspects of the foot. +DP pulse  IMAGING: Stable post op imaging.   LABS:  Results for orders placed or performed during the hospital encounter of 02/26/23 (from the past 24 hour(s))  CBC     Status: Abnormal   Collection Time: 02/28/23  9:47 AM  Result Value Ref Range   WBC 7.4 4.0 - 10.5 K/uL   RBC 3.32 (L) 3.87 - 5.11 MIL/uL   Hemoglobin 10.4 (L) 12.0 - 15.0 g/dL   HCT 16.1 (L) 09.6 - 04.5 %   MCV 96.7 80.0 - 100.0 fL   MCH 31.3 26.0 - 34.0 pg   MCHC 32.4 30.0 - 36.0 g/dL   RDW 40.9 81.1 - 91.4 %   Platelets 217 150 - 400 K/uL   nRBC 0.0 0.0 - 0.2 %  Basic metabolic panel     Status: Abnormal   Collection Time: 02/28/23  9:47 AM  Result Value Ref Range   Sodium 137 135 - 145 mmol/L   Potassium 4.1 3.5 - 5.1 mmol/L   Chloride 100 98 - 111 mmol/L   CO2 26 22 - 32 mmol/L   Glucose, Bld 133 (H) 70 - 99 mg/dL   BUN 32 (H) 8 - 23 mg/dL   Creatinine, Ser 7.82 (H) 0.44 - 1.00 mg/dL   Calcium 8.6 (L) 8.9 - 10.3 mg/dL   GFR, Estimated 44 (L) >60 mL/min   Anion gap 11 5 - 15  Protime-INR     Status: Abnormal    Collection Time: 02/28/23 10:35 AM  Result Value Ref Range   Prothrombin Time 30.0 (H) 11.4 - 15.2 seconds   INR 2.8 (H) 0.8 - 1.2  Type and screen [facility]     Status: None (Preliminary result)   Collection Time: 02/28/23  1:39 PM  Result Value Ref Range   ABO/RH(D) A POS    Antibody Screen NEG    Sample Expiration      03/03/2023,2359 Performed at [facility], [address] [place], [place] [address]    Unit Number [id number]    Blood Component Type RED CELLS,LR    Unit division 00    Status of Unit ALLOCATED    Transfusion Status OK TO TRANSFUSE    Crossmatch Result Compatible    Unit Number [id number]    Blood Component Type RED CELLS,LR    Unit division 00    Status of Unit ALLOCATED    Transfusion Status OK TO TRANSFUSE    Crossmatch Result Compatible   Surgical pcr screen     Status: None  Collection Time: 02/28/23  2:05 PM   Specimen: Nasal Mucosa; Nasal Swab  Result Value Ref Range   MRSA, PCR NEGATIVE NEGATIVE   Staphylococcus aureus NEGATIVE NEGATIVE  Protime-INR     Status: Abnormal   Collection Time: 03/01/23  5:01 AM  Result Value Ref Range   Prothrombin Time 16.8 (H) 11.4 - 15.2 seconds   INR 1.3 (H) 0.8 - 1.2    ASSESSMENT: Sierra [name] is a 77 y.o. female, 1 Day Post-Op s/p OPEN REDUCTION INTERNAL FIXATION RIGHT DISTAL FEMUR FRACTURE  CV/Blood loss: Hemoglobin 10.4 preoperatively.  CBC pending this morning.  Hemodynamically stable  PLAN: Weightbearing: WBAT RLE ROM: Unrestricted ROM Incisional and dressing care: Reinforce dressings as needed  Showering: Okay to begin showering and getting incisions wet 03/03/2023 Orthopedic device(s): None  Pain management:  1. Tylenol 650 mg q 6 hours scheduled 2. Robaxin 500 mg q 6 hours PRN 3. Oxycodone 5 mg q 4 hours PRN 4. Dilaudid 0.5-1 mg q 4 hours PRN VTE prophylaxis: Lovenox started this AM. Plan is to restart Coumadin this afternoon.  SCDs ID:  Ancef 2gm post  op Foley/Lines:  No foley, KVO IVFs Impediments to Fracture Healing: Vitamin D level pending.  Currently on 5000 units vitamin D3 MWF  Dispo: PT/OT evaluation today, dispo pending.  Plan to remove dressing RLE tomorrow 03/02/2023.   D/C recommendations: -Oxycodone and Robaxin for pain control -Home dose Coumadin for DVT prophylaxis -Resume home dosing of Vit D supplementation  Follow - up plan: 2 weeks after discharge for wound check and repeat x-rays   Contact information:  Truitt Merle MD, Thyra Breed PA-C. After hours and holidays please check [email or web address] for group call information for [facility]   Thompson Caul, PA-C [phone] (office) [email or web address]

## 2023-03-01 NOTE — Assessment & Plan Note (Deleted)
Pain controlled with regimen below. S/p ORIF 9/9 with orthopedics.  Surgical risk overall above average as per H&P.  Previously medically optimized for surgery from cardiac standpoint in April 2024. -Orthopedics following, appreciate recommendations -Consider further cardiac optimization with updated echo (last 2018) -Pain control with Dilaudid 1 mg every 3, scheduled Tylenol 650 every 6 -PT/OT once able -Regular diet for now, will need to be n.p.o. Monday morning 9/9 at 12 AM -Follow up AM labs: CBC, BMP, PT INR

## 2023-03-01 NOTE — Anesthesia Postprocedure Evaluation (Signed)
Anesthesia Post Note  Patient: [name]  Procedure(s) Performed: OPEN REDUCTION INTERNAL FIXATION (ORIF) DISTAL FEMUR FRACTURE (Right)     Patient location during evaluation: PACU Anesthesia Type: Regional and General Level of consciousness: awake and alert Pain management: pain level controlled Vital Signs Assessment: post-procedure vital signs reviewed and stable Respiratory status: spontaneous breathing, nonlabored ventilation, respiratory function stable and patient connected to nasal cannula oxygen Cardiovascular status: blood pressure returned to baseline and stable Postop Assessment: no apparent nausea or vomiting Anesthetic complications: no   No notable events documented.  Last Vitals:  Vitals:   02/28/23 2104 03/01/23 0516  BP: (!) 141/71 (!) 147/67  Pulse: 91 91  Resp: 18 18  Temp: 36.7 C 37.1 C  SpO2: 97% 98%    Last Pain:  Vitals:   03/01/23 0516  TempSrc: Oral  PainSc:                  Nelle Don Wille Aubuchon

## 2023-03-01 NOTE — Care Management Important Message (Signed)
Important Message  Patient Details  Name: Sierra [name] MRN: [id number] Date of Birth: 09-12-1945   Medicare Important Message Given:  Yes     Sukaina Toothaker Stefan Church 03/01/2023, 1:31 PM

## 2023-03-01 NOTE — Plan of Care (Signed)
  Problem: Education: Goal: Knowledge of General Education information will improve Description: Including pain rating scale, medication(s)/side effects and non-pharmacologic comfort measures Outcome: Progressing   Problem: Health Behavior/Discharge Planning: Goal: Ability to manage health-related needs will improve Outcome: Progressing   Problem: Clinical Measurements: Goal: Ability to maintain clinical measurements within normal limits will improve Outcome: Progressing Goal: Will remain free from infection Outcome: Progressing Goal: Diagnostic test results will improve Outcome: Progressing Goal: Respiratory complications will improve Outcome: Progressing Goal: Cardiovascular complication will be avoided Outcome: Progressing   Problem: Activity: Goal: Risk for activity intolerance will decrease Outcome: Progressing   Problem: Nutrition: Goal: Adequate nutrition will be maintained Outcome: Progressing   Problem: Coping: Goal: Level of anxiety will decrease Outcome: Progressing   Problem: Elimination: Goal: Will not experience complications related to bowel motility Outcome: Progressing Goal: Will not experience complications related to urinary retention Outcome: Progressing   Problem: Pain Managment: Goal: General experience of comfort will improve Outcome: Progressing   Problem: Safety: Goal: Ability to remain free from injury will improve Outcome: Progressing   Problem: Skin Integrity: Goal: Risk for impaired skin integrity will decrease Outcome: Progressing   Problem: Education: Goal: Knowledge of the prescribed therapeutic regimen will improve Outcome: Progressing Goal: Individualized Educational Video(s) Outcome: Progressing   Problem: Activity: Goal: Ability to avoid complications of mobility impairment will improve Outcome: Progressing Goal: Range of joint motion will improve Outcome: Progressing   Problem: Clinical Measurements: Goal:  Postoperative complications will be avoided or minimized Outcome: Progressing   Problem: Pain Management: Goal: Pain level will decrease with appropriate interventions Outcome: Progressing   Problem: Skin Integrity: Goal: Will show signs of wound healing Outcome: Progressing   Problem: Education: Goal: Knowledge of the prescribed therapeutic regimen will improve Outcome: Progressing Goal: Understanding of activity limitations/precautions following surgery will improve Outcome: Progressing Goal: Individualized Educational Video(s) Outcome: Progressing   Problem: Activity: Goal: Ability to tolerate increased activity will improve Outcome: Progressing   Problem: Pain Management: Goal: Pain level will decrease with appropriate interventions Outcome: Progressing

## 2023-03-01 NOTE — Progress Notes (Addendum)
Daily Progress Note Intern Pager: [phone]  Patient name: Sierra [name] Medical record number: [id number] Date of birth: 1945-10-24 Age: 77 y.o. Gender: female  Primary Care Provider: Emilio Aspen, MD Consultants: Gaylord Shih, PT, OT Code Status: Full  Pt Overview and Major Events to Date: Sierra [name] is a 77 y.o. female who was admitted to the [facility] Medicine Teaching Service at [facility] for oblique angulated fracture of the right distal femur. PMH of osteoarthritis status post multiple shoulder/hip/knee arthroplasties, chronic diastolic heart failure, paroxysmal A-fib, hypertension. Hospital course is outlined below by problem.   9/7: Admitted with hip fracture, Ortho consulted 9/8: Awaiting ORIF due to above average surgical risk 9/9: ORIF completed 9/10: Restarted on warfarin, PT/OT consulted  ABX Overview: - Cefazolin - Surgical Pre-op, 1 dose 02/28/2023 - Cefazolin - Surgical prophylaxis 3 doses, 2 g, Q8 - 9/10-9/11  Assessment and Plan: Assessment & Plan Status post type I or II open fracture of femur Pain controlled with regimen below. S/p ORIF 9/9 with orthopedics.   - Pain control with: oxycodone 5 mg, Q4, scheduled Tylenol 650 every 6 - PT/OT once able - Cefazolin - Surgical prophylaxis 3 doses, 2 g, Q8 - 9/10-9/11 - Follow up AM labs: CBC, BMP, PT INR  Atrial fibrillation (HCC) Held Coumadin prior to surgery,. Discuss VTE ppx with surgery - Restarted 5 mg coumadin   Chronic and Stable Issues: A-fib, hypertension, heart failure, depression, allergies, GERD-continue home regimen   FEN/GI: Regular diet, MiraLAX 17 g daily PPx: Lovenox 40 mg subcu daily Dispo: Pending PT recommendations pending clinical improvement .   Subjective:  Doing overall well this morning. In great spirits given her situation. Her pain is controlled. She is comfortable in bed. She had a hotdog, unfortunately without mustard, last night. Has been able to take in fluids  well.  Objective: Temp:  [97.8 F (36.6 C)-99 F (37.2 C)] 98 F (36.7 C) (09/10 0802) Pulse Rate:  [81-95] 95 (09/10 0802) Resp:  [11-21] 18 (09/10 0802) BP: (124-165)/(60-86) 128/62 (09/10 0802) SpO2:  [87 %-100 %] 95 % (09/10 0802)  Physical Exam: General: Pt is sedated by her recent dilauded dose prior to exam Cardiovascular: RRR without m/r/g Respiratory: CTAB anteriorly without any wheezes Abdomen: Soft, NTTP. Pt reports constipation without her daily fiber cereal. Extremities: Pt able to move with limited range, no focal concerns at this moment.  Laboratory: Most recent CBC Lab Results  Component Value Date   WBC 7.4 02/28/2023   HGB 10.4 (L) 02/28/2023   HCT 32.1 (L) 02/28/2023   MCV 96.7 02/28/2023   PLT 217 02/28/2023   Most recent BMP    Latest Ref Rng & Units 03/01/2023    5:01 AM  BMP  Glucose 70 - 99 mg/dL 109   BUN 8 - 23 mg/dL 33   Creatinine 3.23 - 1.00 mg/dL 5.57   Sodium 322 - 025 mmol/L 137   Potassium 3.5 - 5.1 mmol/L 4.6   Chloride 98 - 111 mmol/L 101   CO2 22 - 32 mmol/L 26   Calcium 8.9 - 10.3 mg/dL 8.7     Other pertinent labs:  Latest Reference Range & Units 02/27/23 07:42 02/28/23 09:47 03/01/23 05:01  GFR, Estimated >60 mL/min >60 44 (L) 47 (L)  (L): Data is abnormally low     Latest Reference Range & Units 02/27/23 07:42 02/28/23 10:35 03/01/23 05:01  Prothrombin Time 11.4 - 15.2 seconds 27.4 (H) 30.0 (H) 16.8 (H)  INR 0.8 - 1.2  2.5 (H) 2.8 (H) 1.3 (H)  (H): Data is abnormally high  Imaging/Diagnostic Tests: Radiologist Impression: no interval xrays My interpretation: na  Margaretmary Dys, MD 03/01/2023, 12:44 PM  PGY-1, [facility] FPTS Intern pager: [phone], text pages welcome Secure chat group [name] Teaching Service

## 2023-03-01 NOTE — Evaluation (Signed)
Physical Therapy Evaluation Patient Details Name: Sierra [name] MRN: [id number] DOB: 07/16/1945 Today's Date: 03/01/2023  History of Present Illness  77 yo female admitted 9/7 after fall with Rt femur fx. 9/9 ORIf femur. PMhx: HTN, HLD, Afib, depression, Lt reverse TSA, CHF, Bil THA, Rt TKA  Clinical Impression  Pt pleasant and reports doing well after Rt TKA in April with only using cane intermittently at beginning of day and outside. Vidya stating she needs LT TKA and has limited strength and function in LLE due to this. Pt with decreased strength, ROM, transfers, gait and function who will benefit from acute therapy to maximize mobility, safety and independence. Encouraged OOB to chair with staff today with hopes of progression to gait next session.         If plan is discharge home, recommend the following: A lot of help with walking and/or transfers;A lot of help with bathing/dressing/bathroom;Assist for transportation;Help with stairs or ramp for entrance   Can travel by private vehicle        Equipment Recommendations None recommended by PT  Recommendations for Other Services       Functional Status Assessment Patient has had a recent decline in their functional status and demonstrates the ability to make significant improvements in function in a reasonable and predictable amount of time.     Precautions / Restrictions Precautions Precautions: Fall Restrictions Weight Bearing Restrictions: Yes RLE Weight Bearing: Weight bearing as tolerated      Mobility  Bed Mobility Overal bed mobility: Needs Assistance Bed Mobility: Supine to Sit     Supine to sit: Mod assist, HOB elevated     General bed mobility comments: mod assist to bring LB to EOB, elevate trunk and assist to pad to pivot to EOB with increased time    Transfers Overall transfer level: Needs assistance   Transfers: Sit to/from Stand Sit to Stand: Min assist   Step pivot transfers: Min assist        General transfer comment: cues for hand placement, sequence and safety with assist to rise from surface, increased time to gain balance in standing. small sequential steps to pivot to chair with cues , pt with difficulty lifiting LLE and impacted with "fuzziness" due to dilaudid prior to session    Ambulation/Gait               General Gait Details: not yet able  Stairs            Wheelchair Mobility     Tilt Bed    Modified Rankin (Stroke Patients Only)       Balance Overall balance assessment: Needs assistance Sitting-balance support: Feet supported Sitting balance-Leahy Scale: Good     Standing balance support: Bilateral upper extremity supported, During functional activity Standing balance-Leahy Scale: Poor Standing balance comment: reliant on RW in standing                             Pertinent Vitals/Pain Pain Assessment Pain Assessment: No/denies pain    Home Living Family/patient expects to be discharged to:: Private residence Living Arrangements: Spouse/significant other Available Help at Discharge: Family;Available 24 hours/day Type of Home: House Home Access: Stairs to enter Entrance Stairs-Rails: Doctor, general practice of Steps: 4   Home Layout: One level Home Equipment: Cane - single point;Toilet riser;Shower seat;Grab bars - tub/shower;Rolling Walker (2 wheels);BSC/3in1 Additional Comments: sleeping in lift chair    Prior Function Prior Level of Function :  Independent/Modified Independent             Mobility Comments: SPC with gait in home and community, ADLs Comments: mod I with ADLs, self care tasks, self care tasks. Husband assists with IADLs and household management.     Extremity/Trunk Assessment   Upper Extremity Assessment Upper Extremity Assessment: Generalized weakness    Lower Extremity Assessment Lower Extremity Assessment: RLE deficits/detail;LLE deficits/detail RLE Deficits / Details:  decreased strength and ROM post op LLE Deficits / Details: decreased strength grossly 3/5 with limited knee flexion, pt reports awaiting Lt TKA    Cervical / Trunk Assessment Cervical / Trunk Assessment: Normal  Communication   Communication Communication: No apparent difficulties  Cognition Arousal: Alert Behavior During Therapy: WFL for tasks assessed/performed Overall Cognitive Status: Within Functional Limits for tasks assessed                                          General Comments General comments (skin integrity, edema, etc.): VSS on RA    Exercises General Exercises - Lower Extremity Heel Slides: AAROM, Right, Supine, 5 reps Hip ABduction/ADduction: AAROM, Right, Supine, 5 reps   Assessment/Plan    PT Assessment Patient needs continued PT services  PT Problem List Decreased strength;Decreased range of motion;Decreased activity tolerance;Decreased balance;Decreased knowledge of use of DME;Decreased mobility       PT Treatment Interventions DME instruction;Gait training;Stair training;Functional mobility training;Therapeutic activities;Patient/family education;Balance training;Therapeutic exercise    PT Goals (Current goals can be found in the Care Plan section)  Acute Rehab PT Goals Patient Stated Goal: return home, read PT Goal Formulation: With patient Time For Goal Achievement: 03/15/23 Potential to Achieve Goals: Good    Frequency Min 1X/week     Co-evaluation               AM-PAC PT "6 Clicks" Mobility  Outcome Measure Help needed turning from your back to your side while in a flat bed without using bedrails?: A Little Help needed moving from lying on your back to sitting on the side of a flat bed without using bedrails?: A Lot Help needed moving to and from a bed to a chair (including a wheelchair)?: A Lot Help needed standing up from a chair using your arms (e.g., wheelchair or bedside chair)?: A Lot Help needed to walk in  hospital room?: Total Help needed climbing 3-5 steps with a railing? : Total 6 Click Score: 11    End of Session Equipment Utilized During Treatment: Gait belt Activity Tolerance: Patient tolerated treatment well Patient left: in chair;with call bell/phone within reach;with chair alarm set Nurse Communication: Mobility status PT Visit Diagnosis: Other abnormalities of gait and mobility (R26.89);Muscle weakness (generalized) (M62.81)    Time: 2536-6440 PT Time Calculation (min) (ACUTE ONLY): 25 min   Charges:   PT Evaluation $PT Eval Moderate Complexity: 1 Mod PT Treatments $Therapeutic Activity: 8-22 mins PT General Charges $$ ACUTE PT VISIT: 1 Visit         Merryl Hacker, PT Acute Rehabilitation Services Office: [phone]   Enedina Finner Erle Guster 03/01/2023, 9:56 AM

## 2023-03-01 NOTE — Progress Notes (Addendum)
ANTICOAGULATION CONSULT NOTE - Initial Consult  Pharmacy Consult for Warfarin Indication: atrial fibrillation  Allergies  Allergen Reactions   Ace Inhibitors Cough   Codeine Nausea Only     GI Intolerance   Erythromycin Other (See Comments)    Stomach pain    Iohexol Hives     Code: HIVES, Desc: patient states develops hives w/ iv contrast/mms    Iodinated Contrast Media Other (See Comments)    Hives    Patient Measurements: Height: 5\' 5"  (165.1 cm) Weight: 81.6 kg (180 lb) IBW/kg (Calculated) : 57  Vital Signs: Temp: 98.8 F (37.1 C) (09/10 0516) Temp Source: Oral (09/10 0516) BP: 147/67 (09/10 0516) Pulse Rate: 91 (09/10 0516)  Labs: Recent Labs    02/26/23 1528 02/27/23 0742 02/28/23 0947 02/28/23 1035 03/01/23 0501  HGB 11.5* 10.7* 10.4*  --   --   HCT 34.5* 32.0* 32.1*  --   --   PLT 205 221 217  --   --   LABPROT 31.3* 27.4*  --  30.0* 16.8*  INR 3.0* 2.5*  --  2.8* 1.3*  CREATININE 0.96 0.94 1.26*  --   --     Estimated Creatinine Clearance: 38.8 mL/min (A) (by C-G formula based on SCr of 1.26 mg/dL (H)).   Medical History: Past Medical History:  Diagnosis Date   Anxiety    Arthritis    "joints" (09/19/2013)   Atrial fibrillation (HCC)    Basal cell carcinoma of cheek    left   CHF (congestive heart failure) (HCC)    Chronic lower back pain    Dysrhythmia    GERD (gastroesophageal reflux disease)    H/O hiatal hernia    Hypertension    Migraine    "haven't had any in a long time" (09/19/2013)   Pneumonia ~ 2012    Medications:  Scheduled:   acetaminophen  650 mg Oral Q6H   cholecalciferol  5,000 Units Oral Daily   citalopram  20 mg Oral Daily   colestipol  5 g Oral BID   docusate sodium  100 mg Oral BID   enoxaparin (LOVENOX) injection  40 mg Subcutaneous Q24H   irbesartan  150 mg Oral Daily   loratadine  10 mg Oral Daily   metoprolol succinate  100 mg Oral BID   multivitamin  1 tablet Oral Daily   multivitamin with minerals  1  tablet Oral Daily   pantoprazole  40 mg Oral Daily   polyethylene glycol  17 g Oral Daily   torsemide  20 mg Oral Daily   Warfarin - Pharmacist Dosing Inpatient   Does not apply q1600   Assessment: 76YOF with atrial fibrillation on warfarin who presented with a femur fracture after a fall. Warfarin was held on admission due to ORIF on 9/9. Since INR was 2.8 on day of procedure, patient received vitamin K 2.5mg  IV x1. Ortho has cleared restarting warfarin today. Pharmacy has been consulted for warfarin dosing.  Home dose of warfarin has consistently been 2.5mg  daily, except 5mg  on Mondays and Fridays. INR today is 1.3 s/p vitamin K. Usual home dose for today would typically be 2.5mg , however will give higher dose.  Pharmacy spoke with patient who stated she does not typically bridge her warfarin peri-op. Discussed with FM team and determined no need to bridge patient while awaiting therapeutic INR.  Goal of Therapy:  INR 2-3 Monitor platelets by anticoagulation protocol: Yes   Plan:  Give warfarin 5mg  x1 Start enoxaparin 40mg  daily  and continue until INR > 2 F/u daily INR and CBC  Nicole Kindred, PharmD PGY1 Pharmacy Resident 03/01/2023 7:59 AM

## 2023-03-01 NOTE — Progress Notes (Deleted)
     Daily Progress Note Intern Pager: [phone]  Patient name: Sierra [name] Medical record number: [id number] Date of birth: Sep 07, 1945 Age: 77 y.o. Gender: female  Primary Care Provider: Emilio Aspen, MD Consultants: *** Code Status: ***  Pt Overview and Major Events to Date:  ***  Assessment and Plan:  ***. Pertinent PMH/PSH includes ***.  Assessment & Plan Femur fracture (HCC) Pain controlled with regimen below. S/p ORIF 9/9 with orthopedics.  Surgical risk overall above average as per H&P.  Previously medically optimized for surgery from cardiac standpoint in April 2024. -Orthopedics following, appreciate recommendations -Consider further cardiac optimization with updated echo (last 2018) -Pain control with Dilaudid 1 mg every 3, scheduled Tylenol 650 every 6 -PT/OT once able -Regular diet for now, will need to be n.p.o. Monday morning 9/9 at 12 AM -Follow up AM labs: CBC, BMP, PT INR  Atrial fibrillation (HCC) Holding Coumadin prior to surgery, INR 2.8 this AM. Discuss VTE ppx with surgery   Chronic and Stable Conditions: Atrial fibrillation: Rate controlled, continue home metoprolol 100 BID; hold coumadin 5 mg daily Hypertension: Mildly elevated, likely due to pain, continue formulary equivalent for home valsartan and torsemide Chronic diastolic heart failure: Last Echo in 2018 EF 55-60%, continue home meds as above Depression: Continue home Celexa Allergies: Continue formula equivalent for home Allegra GERD: Continue formulary equivalent for home rabeprazole   FEN/GI: NPO>regular diet post op, miralax 17 g daily VTE Prophylaxis: Holding warfarin, no heparin due to INR 3 (can initiate if <2 per pharmacy)  Subjective:  ***  Objective: Temp:  [97.8 F (36.6 C)-99 F (37.2 C)] 98.8 F (37.1 C) (09/10 0516) Pulse Rate:  [81-94] 91 (09/10 0516) Resp:  [11-21] 18 (09/10 0516) BP: (124-165)/(60-86) 147/67 (09/10 0516) SpO2:  [87 %-100 %] 98 % (09/10  0516)  Physical Exam: General: *** Cardiovascular: *** Respiratory: *** Abdomen: *** Extremities: ***  Laboratory: Most recent CBC Lab Results  Component Value Date   WBC 7.4 02/28/2023   HGB 10.4 (L) 02/28/2023   HCT 32.1 (L) 02/28/2023   MCV 96.7 02/28/2023   PLT 217 02/28/2023   Most recent BMP    Latest Ref Rng & Units 02/28/2023    9:47 AM  BMP  Glucose 70 - 99 mg/dL 034   BUN 8 - 23 mg/dL 32   Creatinine 7.42 - 1.00 mg/dL 5.95   Sodium 638 - 756 mmol/L 137   Potassium 3.5 - 5.1 mmol/L 4.1   Chloride 98 - 111 mmol/L 100   CO2 22 - 32 mmol/L 26   Calcium 8.9 - 10.3 mg/dL 8.6     Other pertinent labs ***   Imaging/Diagnostic Tests: Radiologist Impression: *** My interpretation: *** Margaretmary Dys, MD 03/01/2023, 7:42 AM  PGY-1, [facility] FPTS Intern pager: [phone], text pages welcome Secure chat group [facility] Teaching Service

## 2023-03-01 NOTE — Assessment & Plan Note (Deleted)
Holding Coumadin prior to surgery, INR 2.8 this AM. Discuss VTE ppx with surgery

## 2023-03-01 NOTE — Assessment & Plan Note (Signed)
Held Coumadin prior to surgery,. Discuss VTE ppx with surgery - Restarted 5 mg coumadin

## 2023-03-01 NOTE — Evaluation (Signed)
Occupational Therapy Evaluation Patient Details Name: Sierra [name] MRN: [id number] DOB: [birth year] Today's Date: 03/01/2023   History of Present Illness 77 yo female admitted 9/7 after fall with Rt femur fx. 9/9 ORIf femur. PMhx: HTN, HLD, Afib, depression, Lt reverse TSA, CHF, Bil THA, Rt TKA   Clinical Impression   Sierra [name] was evaluated s/p the above admission list. She is mod I with SPC and lives with her husband at baseline. Upon evaluation the pt was limited by RLE pain, L knee pain, generalized weakness, anxiety in anticipation of pain and poor activity tolerance. Overall she needed mod A to stand from the recliner and mod A to take pivotal steps with the RW to the bed. RLE noted to buckle with weightbearing, needed support to prevent full knee buckle. Due to the deficits listed below the pt also needs max A for LB ADL and set up A for UB ADLs in sitting. Pt will benefit from continued acute OT services, HHOT and support from husband.         If plan is discharge home, recommend the following: A lot of help with walking and/or transfers;A lot of help with bathing/dressing/bathroom;Assistance with cooking/housework;Direct supervision/assist for medications management;Direct supervision/assist for financial management;Assist for transportation;Help with stairs or ramp for entrance    Functional Status Assessment  Patient has had a recent decline in their functional status and demonstrates the ability to make significant improvements in function in a reasonable and predictable amount of time.  Equipment Recommendations  None recommended by OT       Precautions / Restrictions Precautions Precautions: Fall Restrictions Weight Bearing Restrictions: Yes RLE Weight Bearing: Weight bearing as tolerated      Mobility Bed Mobility Overal bed mobility: Needs Assistance Bed Mobility: Sit to Supine       Sit to supine: Mod assist   General bed mobility comments: BLE management     Transfers Overall transfer level: Needs assistance Equipment used: Rolling walker (2 wheels) Transfers: Sit to/from Stand, Bed to chair/wheelchair/BSC Sit to Stand: Mod assist     Step pivot transfers: Mod assist     General transfer comment: increased time and cues needed; R knee blocked to prevent buckling during weight bearing      Balance Overall balance assessment: Needs assistance Sitting-balance support: Feet supported Sitting balance-Leahy Scale: Good     Standing balance support: Bilateral upper extremity supported, During functional activity Standing balance-Leahy Scale: Poor                             ADL either performed or assessed with clinical judgement   ADL Overall ADL's : Needs assistance/impaired Eating/Feeding: Independent   Grooming: Set up;Sitting   Upper Body Bathing: Set up;Sitting   Lower Body Bathing: Maximal assistance;Sit to/from stand   Upper Body Dressing : Set up;Sitting   Lower Body Dressing: Maximal assistance;Sit to/from stand   Toilet Transfer: Moderate assistance;Stand-pivot;Rolling walker (2 wheels)   Toileting- Clothing Manipulation and Hygiene: Contact guard assist;Sitting/lateral lean       Functional mobility during ADLs: Moderate assistance;Rolling walker (2 wheels) General ADL Comments: limited by pain adn weakness, needed R knee blocked to prevent buckling with weight bearing     Vision Baseline Vision/History: 1 Wears glasses Vision Assessment?: No apparent visual deficits     Perception Perception: Within Functional Limits       Praxis Praxis: WFL       Pertinent Vitals/Pain Pain Assessment Pain  Assessment: Faces Faces Pain Scale: Hurts even more Pain Location: RLE and L knee Pain Descriptors / Indicators: Discomfort Pain Intervention(s): Limited activity within patient's tolerance, Monitored during session     Extremity/Trunk Assessment Upper Extremity Assessment Upper Extremity  Assessment: Generalized weakness   Lower Extremity Assessment Lower Extremity Assessment: RLE deficits/detail;LLE deficits/detail RLE Deficits / Details: decreased strength and ROM post op LLE Deficits / Details: decreased strength grossly 3/5 with limited knee flexion, pt reports awaiting Lt TKA   Cervical / Trunk Assessment Cervical / Trunk Assessment: Normal   Communication Communication Communication: No apparent difficulties   Cognition Arousal: Alert Behavior During Therapy: Anxious Overall Cognitive Status: Within Functional Limits for tasks assessed                                 General Comments: WFL for basic orientation and functional tasks. Anxious in anticipation of pain/falling     General Comments  VSS on RA     Home Living Family/patient expects to be discharged to:: Private residence Living Arrangements: Spouse/significant other Available Help at Discharge: Family;Available 24 hours/day Type of Home: House Home Access: Stairs to enter [employer] of Steps: 4 Entrance Stairs-Rails: Right;Left Home Layout: One level     Bathroom Shower/Tub: Producer, television/film/video: Handicapped height Bathroom Accessibility: Yes   Home Equipment: Cane - single point;Toilet riser;Shower seat;Grab bars - Chartered loss adjuster (2 wheels);BSC/3in1   Additional Comments: sleeping in lift chair      Prior Functioning/Environment Prior Level of Function : Independent/Modified Independent             Mobility Comments: SPC with gait in home and community, ADLs Comments: mod I with ADLs, self care tasks, self care tasks. Husband assists with IADLs and household management.        OT Problem List: Decreased strength;Decreased range of motion;Decreased activity tolerance;Impaired balance (sitting and/or standing);Decreased safety awareness;Decreased knowledge of precautions;Decreased knowledge of use of DME or AE;Pain      OT  Treatment/Interventions: Self-care/ADL training;DME and/or AE instruction;Therapeutic exercise;Therapeutic activities;Patient/family education;Balance training    OT Goals(Current goals can be found in the care plan section) Acute Rehab OT Goals Patient Stated Goal: less pain OT Goal Formulation: With patient Time For Goal Achievement: 03/15/23 Potential to Achieve Goals: Good ADL Goals Pt Will Perform Lower Body Bathing: with min assist;sit to/from stand Pt Will Perform Lower Body Dressing: with min assist;sit to/from stand Pt Will Transfer to Toilet: with contact guard assist;bedside commode  OT Frequency: Min 1X/week    Co-evaluation              AM-PAC OT "6 Clicks" Daily Activity     Outcome Measure Help from another person eating meals?: None Help from another person taking care of personal grooming?: A Little Help from another person toileting, which includes using toliet, bedpan, or urinal?: A Lot Help from another person bathing (including washing, rinsing, drying)?: A Lot Help from another person to put on and taking off regular upper body clothing?: A Little Help from another person to put on and taking off regular lower body clothing?: A Lot 6 Click Score: 16   End of Session Equipment Utilized During Treatment: Gait belt;Rolling walker (2 wheels) Nurse Communication: Mobility status  Activity Tolerance: Patient tolerated treatment well;Patient limited by pain Patient left: in bed;with bed alarm set;with call bell/phone within reach  OT Visit Diagnosis: Unsteadiness on feet (R26.81);Other abnormalities of  gait and mobility (R26.89);Muscle weakness (generalized) (M62.81);Pain                Time: 1610-9604 OT Time Calculation (min): 18 min Charges:  OT General Charges $OT Visit: 1 Visit OT Evaluation $OT Eval Moderate Complexity: 1 Mod  Derenda Mis, OTR/L Acute Rehabilitation Services Office [phone] Secure Chat Communication Preferred   Donia Pounds 03/01/2023, 9:53 AM

## 2023-03-01 NOTE — Progress Notes (Signed)
Transition of Care [facility]) - Inpatient Brief Assessment   Patient Details  Name: ZAFIRAH CAISSE MRN: [id number] Date of Birth: [birth year]  Transition of Care [facility]) CM/SW Contact:    Janae Bridgeman, RN Phone Number: 03/01/2023, 11:52 AM   Clinical Narrative: Patient admitted to the hospital for femur fracture and S/P ORIF.  The patient lives with her husband and plans to discharge home when medically stable for discharge.  The patient was offered Medicare choice regarding home health services and the patient chose [facility].  Home health orders were placed to be co-signed by the MD.  [facility] for [facility] services.  No other DME is needed.  Patient's husband will provide transportation to home via car when stable.   Transition of Care Asessment: Insurance and Status: (P) Insurance coverage has been reviewed Patient has primary care physician: (P) Yes Home environment has been reviewed: (P) From home with spouse Prior level of function:: (P) Independent Prior/Current Home Services: (P) No current home services Social Determinants of Health Reivew: (P) SDOH reviewed interventions complete Readmission risk has been reviewed: (P) Yes Transition of care needs: (P) transition of care needs identified, TOC will continue to follow

## 2023-03-01 NOTE — Assessment & Plan Note (Signed)
Pain controlled with regimen below. S/p ORIF 9/9 with orthopedics.   - Pain control with: oxycodone 5 mg, Q4, scheduled Tylenol 650 every 6 - PT/OT once able - Cefazolin - Surgical prophylaxis 3 doses, 2 g, Q8 - 9/10-9/11 - Follow up AM labs: CBC, BMP, PT INR

## 2023-03-02 ENCOUNTER — Encounter (HOSPITAL_COMMUNITY): Payer: Self-pay | Admitting: Student

## 2023-03-02 DIAGNOSIS — Z8781 Personal history of (healed) traumatic fracture: Secondary | ICD-10-CM | POA: Diagnosis not present

## 2023-03-02 LAB — CBC
HCT: 27.2 % — ABNORMAL LOW (ref 36.0–46.0)
Hemoglobin: 8.8 g/dL — ABNORMAL LOW (ref 12.0–15.0)
MCH: 30.8 pg (ref 26.0–34.0)
MCHC: 32.4 g/dL (ref 30.0–36.0)
MCV: 95.1 fL (ref 80.0–100.0)
Platelets: 170 10*3/uL (ref 150–400)
RBC: 2.86 MIL/uL — ABNORMAL LOW (ref 3.87–5.11)
RDW: 14 % (ref 11.5–15.5)
WBC: 6.4 10*3/uL (ref 4.0–10.5)
nRBC: 0 % (ref 0.0–0.2)

## 2023-03-02 LAB — BASIC METABOLIC PANEL
Anion gap: 7 (ref 5–15)
BUN: 38 mg/dL — ABNORMAL HIGH (ref 8–23)
CO2: 27 mmol/L (ref 22–32)
Calcium: 8.4 mg/dL — ABNORMAL LOW (ref 8.9–10.3)
Chloride: 103 mmol/L (ref 98–111)
Creatinine, Ser: 1.15 mg/dL — ABNORMAL HIGH (ref 0.44–1.00)
GFR, Estimated: 49 mL/min — ABNORMAL LOW (ref >60)
Glucose, Bld: 111 mg/dL — ABNORMAL HIGH (ref 70–99)
Potassium: 4 mmol/L (ref 3.5–5.1)
Sodium: 137 mmol/L (ref 135–145)

## 2023-03-02 LAB — PROTIME-INR
INR: 1.1 (ref 0.8–1.2)
Prothrombin Time: 14.8 s (ref 11.4–15.2)

## 2023-03-02 MED ORDER — WARFARIN SODIUM 5 MG PO TABS
5.0000 mg | ORAL_TABLET | Freq: Once | ORAL | Status: AC
  Administered 2023-03-02: 5 mg via ORAL
  Filled 2023-03-02: qty 1

## 2023-03-02 MED ORDER — DOCUSATE SODIUM 100 MG PO CAPS: 100.0000 mg | ORAL_CAPSULE | Freq: Two times a day (BID) | ORAL | 0 refills | Status: DC

## 2023-03-02 MED ORDER — OXYCODONE HCL 5 MG PO TABS: 5.0000 mg | ORAL_TABLET | ORAL | 0 refills | Status: DC | PRN

## 2023-03-02 NOTE — Progress Notes (Signed)
Physical Therapy Treatment Patient Details Name: Sierra [name] MRN: [id number] DOB: [birth year] Today's Date: 03/02/2023   History of Present Illness 77 yo female admitted 9/7 after fall with Rt femur fx. 9/9 ORIf femur. PMhx: HTN, HLD, Afib, depression, Lt reverse TSA, CHF, Bil THA, Rt TKA    PT Comments  Pt greeted up on BSC verbalizing in pain, despite pre-medication with slow progress towards acute goals, however pt pleasant and with good effort during session. Pt needing cues throughout session for breathing techniques to slow RR as pt with noted anxiousness in anticipation of pain and with a fear of falling. Pt able to come to stand with mod down to min A with cues for hand placement and optimal RLE placement for pain mitigation on rise and when coming to sit. Pt able to take steps from [facility] to EOB and along EOB to [facility] with cues for LE sequencing and min A to steady and manage RW. Pt performing LE exercises seated EOB for increased ROM with cues for technique and hands on assist for increased ROM. Discussed with supervising PT and recommendation updated as pt unsafe to return home and will benefit from continued inpatient follow up therapy, <3 hours/day. Pt continues to benefit from skilled PT services to progress toward functional mobility goals.      If plan is discharge home, recommend the following: A lot of help with walking and/or transfers;A lot of help with bathing/dressing/bathroom;Assist for transportation;Help with stairs or ramp for entrance   Can travel by private vehicle     No  Equipment Recommendations  None recommended by PT    Recommendations for Other Services       Precautions / Restrictions Precautions Precautions: Fall Restrictions Weight Bearing Restrictions: Yes RLE Weight Bearing: Weight bearing as tolerated     Mobility  Bed Mobility Overal bed mobility: Needs Assistance Bed Mobility: Sit to Supine       Sit to supine: Mod assist   General bed  mobility comments: mod A to manage BLEs back to bed    Transfers Overall transfer level: Needs assistance Equipment used: Rolling walker (2 wheels) Transfers: Sit to/from Stand, Bed to chair/wheelchair/BSC Sit to Stand: Min assist, Mod assist   Step pivot transfers: Min assist       General transfer comment: cues for hand placement, sequence and safety with assist to rise from surface, increased time to gain balance in standing. x2 attempts needed to come to full standing, min A to take small steps to EOB and along EOB to [facility],    Ambulation/Gait               General Gait Details: pt declining atttempts despite max encouragement   Stairs             Wheelchair Mobility     Tilt Bed    Modified Rankin (Stroke Patients Only)       Balance Overall balance assessment: Needs assistance Sitting-balance support: Feet supported Sitting balance-Leahy Scale: Good     Standing balance support: Bilateral upper extremity supported, During functional activity Standing balance-Leahy Scale: Poor Standing balance comment: reliant on RW in standing                            Cognition Arousal: Alert Behavior During Therapy: WFL for tasks assessed/performed Overall Cognitive Status: Within Functional Limits for tasks assessed  General Comments: WFL for basic orientation and functional tasks. Anxious in anticipation of pain/falling        Exercises General Exercises - Lower Extremity Long Arc Quad: AROM, Right, 10 reps, Seated Hip Flexion/Marching: AROM, Right, 10 reps, Seated    General Comments General comments (skin integrity, edema, etc.): VSS on RA      Pertinent Vitals/Pain Pain Assessment Pain Assessment: Faces Faces Pain Scale: Hurts whole lot Pain Location: RLE and L knee Pain Descriptors / Indicators: Discomfort Pain Intervention(s): Premedicated before session, Limited activity within  patient's tolerance, Monitored during session    Home Living                          Prior Function            PT Goals (current goals can now be found in the care plan section) Acute Rehab PT Goals PT Goal Formulation: With patient Time For Goal Achievement: 03/15/23 Progress towards PT goals: Not progressing toward goals - comment (pain/anxiety)    Frequency    Min 1X/week      PT Plan      Co-evaluation              AM-PAC PT "6 Clicks" Mobility   Outcome Measure  Help needed turning from your back to your side while in a flat bed without using bedrails?: A Little Help needed moving from lying on your back to sitting on the side of a flat bed without using bedrails?: A Lot Help needed moving to and from a bed to a chair (including a wheelchair)?: A Lot Help needed standing up from a chair using your arms (e.g., wheelchair or bedside chair)?: A Lot Help needed to walk in hospital room?: Total Help needed climbing 3-5 steps with a railing? : Total 6 Click Score: 11    End of Session Equipment Utilized During Treatment: Gait belt Activity Tolerance: Patient tolerated treatment well Patient left: in bed;with call bell/phone within reach Nurse Communication: Mobility status PT Visit Diagnosis: Other abnormalities of gait and mobility (R26.89);Muscle weakness (generalized) (M62.81)     Time: 1610-9604 PT Time Calculation (min) (ACUTE ONLY): 23 min  Charges:    $Therapeutic Activity: 23-37 mins PT General Charges $$ ACUTE PT VISIT: 1 Visit                     Ly Bacchi R. PTA Acute Rehabilitation Services Office: [phone]   Catalina [place] 03/02/2023, 3:42 PM

## 2023-03-02 NOTE — TOC Progression Note (Addendum)
Transition of Care [facility]) - Progression Note    Patient Details  Name: Sierra [name] MRN: [id number] Date of Birth: [birth year]  [facility] CM/SW Contact  Janae Bridgeman, RN Phone Number: 03/02/2023, 1:56 PM  Clinical Narrative:    CM met with the patient at the bedside to discuss TOC needs.  The patient states that she is unable to mobilize well and will need Short term rehabilitation before returning home.  FL2 completed and the patient was faxed out in the hub.  [place], MSW was updated and will follow up with the patient for bed offers tomorrow when beds available in the hub.  03/02/23 1618 - Patient presented with bed offers and chose [facility] - I called and left a message with Lowella Bandy, CM at [facility].  [place] , MSW is aware and will follow up with the facility tomorrow.        Expected Discharge Plan and Services                                               Social Determinants of Health (SDOH) Interventions SDOH Screenings   Food Insecurity: No Food Insecurity (02/26/2023)  Housing: Patient Declined (02/26/2023)  Transportation Needs: No Transportation Needs (02/26/2023)  Utilities: Not At Risk (02/26/2023)  Tobacco Use: Low Risk  (02/28/2023)    Readmission Risk Interventions    03/01/2023   11:51 AM  Readmission Risk Prevention Plan  Transportation Screening Complete  PCP or Specialist Appt within 5-7 Days Complete  Home Care Screening Complete  Medication Review (RN CM) Complete

## 2023-03-02 NOTE — Discharge Instructions (Addendum)
Dear Sierra [name],   Thank you so much for allowing [place] to be part of your care!  You were admitted to [facility] for a fracture and had surgery to fix this. Continue with your home warfarin and regular visits to change as needed.   We will send you home with a few days of pain medication and medication to help move your bowels   Follow up with your orthopedic physician and PCP.    POST-HOSPITAL & CARE INSTRUCTIONS Please let PCP/Specialists know of any changes that were made.  Please see medications section of this packet for any medication changes.   DOCTOR'S APPOINTMENT & FOLLOW UP CARE INSTRUCTIONS  Future Appointments  Date Time Provider Department Center  03/22/2023  2:15 PM [facility] None  05/17/2023  3:10 PM Alver Sorrow, NP [facility]    Take care and be well!  Family Medicine Teaching Service  [place]  [facility]  [address] [place], [place] [address] [phone]     Orthopaedic Trauma Service Discharge Instructions   General Discharge Instructions  WEIGHT BEARING STATUS:weightbearing as tolerated right lower extremity  RANGE OF MOTION/ACTIVITY: ok for unrestricted hip and knee range of motion  Wound Care: You may remove your surgical dressing on post op day 2, (Wednesday 03/02/23). Incisions can be left open to air if there is no drainage. Once the incision is completely dry and without drainage, it may be left open to air out.  Showering may begin post-op day 3, (Thursday 03/03/23).  Clean incision gently with soap and water.  DVT/PE prophylaxis: Coumadin  Diet: as you were eating previously.  Can use over the counter stool softeners and bowel preparations, such as Miralax, to help with bowel movements.  Narcotics can be constipating.  Be sure to drink plenty of fluids  PAIN MEDICATION USE AND EXPECTATIONS  You have likely been given narcotic medications to help control your pain.  After a  traumatic event that results in an fracture (broken bone) with or without surgery, it is ok to use narcotic pain medications to help control one's pain.  We understand that everyone responds to pain differently and each individual patient will be evaluated on a regular basis for the continued need for narcotic medications. Ideally, narcotic medication use should last no more than 6-8 weeks (coinciding with fracture healing).   As a patient it is your responsibility as well to monitor narcotic medication use and report the amount and frequency you use these medications when you come to your office visit.   We would also advise that if you are using narcotic medications, you should take a dose prior to therapy to maximize you participation.  IF YOU ARE ON NARCOTIC MEDICATIONS IT IS NOT PERMISSIBLE TO OPERATE A MOTOR VEHICLE (MOTORCYCLE/CAR/TRUCK/MOPED) OR HEAVY MACHINERY DO NOT MIX NARCOTICS WITH OTHER CNS (CENTRAL NERVOUS SYSTEM) DEPRESSANTS SUCH AS ALCOHOL   STOP SMOKING OR USING NICOTINE PRODUCTS!!!!  As discussed nicotine severely impairs your body's ability to heal surgical and traumatic wounds but also impairs bone healing.  Wounds and bone heal by forming microscopic blood vessels (angiogenesis) and nicotine is a vasoconstrictor (essentially, shrinks blood vessels).  Therefore, if vasoconstriction occurs to these microscopic blood vessels they essentially disappear and are unable to deliver necessary nutrients to the healing tissue.  This is one modifiable factor that you can do to dramatically increase your chances of healing your injury.    (This means no smoking,  no nicotine gum, patches, etc)  DO NOT USE NONSTEROIDAL ANTI-INFLAMMATORY DRUGS (NSAID'S)  Using products such as Advil (ibuprofen), Aleve (naproxen), Motrin (ibuprofen) for additional pain control during fracture healing can delay and/or prevent the healing response.  If you would like to take over the counter (OTC) medication, Tylenol  (acetaminophen) is ok.  However, some narcotic medications that are given for pain control contain acetaminophen as well. Therefore, you should not exceed more than 4000 mg of tylenol in a day if you do not have liver disease.  Also note that there are may OTC medicines, such as cold medicines and allergy medicines that my contain tylenol as well.  If you have any questions about medications and/or interactions please ask your doctor/PA or your pharmacist.      ICE AND ELEVATE INJURED/OPERATIVE EXTREMITY  Using ice and elevating the injured extremity above your heart can help with swelling and pain control.  Icing in a pulsatile fashion, such as 20 minutes on and 20 minutes off, can be followed.    Do not place ice directly on skin. Make sure there is a barrier between to skin and the ice pack.    Using frozen items such as frozen peas works well as the conform nicely to the are that needs to be iced.  USE AN ACE WRAP OR TED HOSE FOR SWELLING CONTROL  In addition to icing and elevation, Ace wraps or TED hose are used to help limit and resolve swelling.  It is recommended to use Ace wraps or TED hose until you are informed to stop.    When using Ace Wraps start the wrapping distally (farthest away from the body) and wrap proximally (closer to the body)   Example: If you had surgery on your leg or thing and you do not have a splint on, start the ace wrap at the toes and work your way up to the thigh        If you had surgery on your upper extremity and do not have a splint on, start the ace wrap at your fingers and work your way up to the upper arm  CALL THE OFFICE FOR MEDICATION REFILLS OR WITH ANY QUESTIONS/CONCERNS: [phone]   VISIT OUR WEBSITE FOR ADDITIONAL INFORMATION: [email or web address]     Discharge Wound Care Instructions  Do NOT apply any ointments, solutions or lotions to pin sites or surgical wounds.  These prevent needed drainage and even though solutions like hydrogen peroxide  kill bacteria, they also damage cells lining the pin sites that help fight infection.  Applying lotions or ointments can keep the wounds moist and can cause them to breakdown and open up as well. This can increase the risk for infection. When in doubt call the office.  If any drainage is noted, use one layer of adaptic or Mepitel, then gauze, Kerlix, and an ace wrap. - These dressing supplies should be available at local medical supply stores [facility], [facility], etc) as well as Insurance claims handler ([employer], [facility], [place], etc)  Once the incision is completely dry and without drainage, it may be left open to air out.  Showering may begin 36-48 hours later.  Cleaning gently with soap and water. ________________________________________________________  Warfarin Dosing & Monitoring  Please continue warfarin 2.5mg  daily except 5mg  Mon/Fri.  Recommend next INR checks Friday 9/13 and Monday 9/16.  Can discontinue Lovenox when INR >2.

## 2023-03-02 NOTE — Assessment & Plan Note (Signed)
Pain controlled with regimen below. S/p ORIF 9/9 with orthopedics.   - Pain control with: oxycodone 5 mg, Q4, scheduled Tylenol 650 every 6 - PT/OT once able - Cefazolin - Surgical prophylaxis 3 doses, 2 g, Q8 - 9/10-9/11 - Follow up AM labs: CBC, BMP, PT INR

## 2023-03-02 NOTE — Progress Notes (Signed)
Daily Progress Note Intern Pager: [phone]  Patient name: Sierra [name] Medical record number: [id number] Date of birth: [birth year] Age: 77 y.o. Gender: female  Primary Care Provider: Emilio Aspen, MD Consultants: Gaylord Shih, PT, OT Code Status: Full  Pt Overview and Major Events to Date: Sierra [name] is a 77 y.o. female who was admitted to the [facility] Medicine Teaching Service at [facility] for oblique angulated fracture of the right distal femur. PMH of osteoarthritis status post multiple shoulder/hip/knee arthroplasties, chronic diastolic heart failure, paroxysmal A-fib, hypertension. Hospital course is outlined below by problem.   9/7: Admitted with hip fracture, Ortho consulted 9/8: Open Reduction delayed due to above average surgical risk from anticoagulation 9/9: Open Reduction Internal Fixation completed 9/10: Restarted on warfarin, PT/OT consulted 9/11: Pt still not stooling, consult placed to [facility] for SNF assistance. Pt completed ABX therapy.  ABX Overview: - Cefazolin - Surgical Pre-op, 1 dose 02/28/2023 - Cefazolin - Surgical prophylaxis 3 doses, 2 g, Q8 - 9/10-9/11  Assessment and Plan: Assessment & Plan Status post type I or II open fracture of femur Pain controlled with regimen below. S/p ORIF 9/9 with orthopedics.   - Pain control with: oxycodone 5 mg, Q4, scheduled Tylenol 650 every 6 - PT/OT once able - Cefazolin - Surgical prophylaxis 3 doses, 2 g, Q8 - 9/10-9/11 - Follow up AM labs: CBC, BMP, PT INR  Atrial fibrillation (HCC) Held Coumadin prior to surgery,. Discuss VTE ppx with surgery - Restarted 5 mg coumadin   Chronic and Stable Issues: A-fib, hypertension, heart failure, depression, allergies, GERD-continue home regimen   FEN/GI: Regular diet, MiraLAX 17 g daily PPx: Lovenox 40 mg subcu daily Dispo: Pending PT recommendations pending clinical improvement .   Subjective:  Pt has some concerns about going home today, Dr Pollie Meyer talked with her  about the possibility of a skilled nursing facility. She slept well overall, but her pain is still significant in her leg. She also still has some residual discomfort in her abdomen from not stooling. Increased her bowel regimen to reflect this.   Objective: Temp:  [98.1 F (36.7 C)-98.4 F (36.9 C)] 98.1 F (36.7 C) (09/11 0729) Pulse Rate:  [94-101] 94 (09/11 0729) Resp:  [17-18] 18 (09/11 0729) BP: (112-127)/(55-60) 127/60 (09/11 0729) SpO2:  [92 %-95 %] 95 % (09/11 0729)  Physical Exam: General: Pt awake and alert. No major concerns, NAD Cardiovascular: RRR without m/r/g Respiratory: CTAB anteriorly without any wheezes Abdomen: Soft, NTTP. Pt reports constipation. Extremities: Pt having difficulties moving her injured leg.   Laboratory: Most recent CBC Lab Results  Component Value Date   WBC 6.4 03/02/2023   HGB 8.8 (L) 03/02/2023   HCT 27.2 (L) 03/02/2023   MCV 95.1 03/02/2023   PLT 170 03/02/2023   Most recent BMP    Latest Ref Rng & Units 03/02/2023    8:42 AM  BMP  Glucose 70 - 99 mg/dL 301   BUN 8 - 23 mg/dL 38   Creatinine 6.01 - 1.00 mg/dL 0.93   Sodium 235 - 573 mmol/L 137   Potassium 3.5 - 5.1 mmol/L 4.0   Chloride 98 - 111 mmol/L 103   CO2 22 - 32 mmol/L 27   Calcium 8.9 - 10.3 mg/dL 8.4     Imaging/Diagnostic Tests: Radiologist Impression: no interval xrays My interpretation: na  Margaretmary Dys, MD 03/02/2023, 11:58 AM  PGY-1, [place] FPTS Intern pager: [phone], text pages welcome Secure chat group [facility] Family  Medicine [place] Teaching Service

## 2023-03-02 NOTE — Plan of Care (Signed)
  Problem: Education: Goal: Knowledge of General Education information will improve Description: Including pain rating scale, medication(s)/side effects and non-pharmacologic comfort measures Outcome: Progressing   Problem: Health Behavior/Discharge Planning: Goal: Ability to manage health-related needs will improve Outcome: Progressing   Problem: Clinical Measurements: Goal: Ability to maintain clinical measurements within normal limits will improve Outcome: Progressing Goal: Will remain free from infection Outcome: Progressing Goal: Diagnostic test results will improve Outcome: Progressing Goal: Respiratory complications will improve Outcome: Progressing Goal: Cardiovascular complication will be avoided Outcome: Progressing   Problem: Activity: Goal: Risk for activity intolerance will decrease Outcome: Progressing   Problem: Nutrition: Goal: Adequate nutrition will be maintained Outcome: Progressing   Problem: Coping: Goal: Level of anxiety will decrease Outcome: Progressing   Problem: Elimination: Goal: Will not experience complications related to bowel motility Outcome: Progressing Goal: Will not experience complications related to urinary retention Outcome: Progressing   Problem: Pain Managment: Goal: General experience of comfort will improve Outcome: Progressing   Problem: Safety: Goal: Ability to remain free from injury will improve Outcome: Progressing   Problem: Skin Integrity: Goal: Risk for impaired skin integrity will decrease Outcome: Progressing   Problem: Education: Goal: Knowledge of the prescribed therapeutic regimen will improve Outcome: Progressing Goal: Individualized Educational Video(s) Outcome: Progressing   Problem: Activity: Goal: Ability to avoid complications of mobility impairment will improve Outcome: Progressing Goal: Range of joint motion will improve Outcome: Progressing   Problem: Clinical Measurements: Goal:  Postoperative complications will be avoided or minimized Outcome: Progressing   Problem: Pain Management: Goal: Pain level will decrease with appropriate interventions Outcome: Progressing   Problem: Skin Integrity: Goal: Will show signs of wound healing Outcome: Progressing   Problem: Education: Goal: Knowledge of the prescribed therapeutic regimen will improve Outcome: Progressing Goal: Understanding of activity limitations/precautions following surgery will improve Outcome: Progressing Goal: Individualized Educational Video(s) Outcome: Progressing   Problem: Activity: Goal: Ability to tolerate increased activity will improve Outcome: Progressing   Problem: Pain Management: Goal: Pain level will decrease with appropriate interventions Outcome: Progressing

## 2023-03-02 NOTE — Assessment & Plan Note (Signed)
Held Coumadin prior to surgery,. Discuss VTE ppx with surgery - Restarted 5 mg coumadin

## 2023-03-02 NOTE — Discharge Summary (Shared)
Family Medicine Teaching [facility] Discharge Summary  Patient name: Sierra [name] Medical record number: [id number] Date of birth: 07/07/1945 Age: 77 y.o. Gender: female Date of Admission: 02/26/2023  Date of Discharge: 03/03/2023 Admitting Physician: Evette Georges, MD  Primary Care Provider: Emilio Aspen, MD Consultants: Orthopedic Surgery  Indication for Hospitalization: Femur fracture  Brief Hospital Course:  Pt Overview and Major Events to Date: Sierra [name] is a 77 y.o. female who was admitted to the [facility] Medicine Teaching Service at [facility] for oblique angulated fracture of the right distal femur. PMH of osteoarthritis status post multiple shoulder/hip/knee arthroplasties, chronic diastolic heart failure, paroxysmal A-fib, hypertension. Hospital course is outlined below by problem.   Femur fracture Presented after a fall due to tripping over an object in her home. No evidence of cardiac or neurological involvement.  Presented to ED with GCS 15 with intact ABCs.   Orthopedics consulted, performed Open reduction with internal fixation on 9/9. Surgical site well-healing. Pain was controlled w/ Tylenol, robaxin, and oxycodone. At time of discharge, pain was controlled, VSS. Will discharge to SNF and has Ortho f/u in 2weeks.    Paroxysmal atrial fibrillation Rate controlled on arrival. Metoprolol continued.  Coumadin restarted after surgery.  Chronic and stable conditions: Home meds are formulary equivalents continued for hypertension, heart failure, depression, allergies, GERD.  Issues for follow up: Follow up with Ortho in 2 weeks after discharge for wound check and repeat x-rays   Disposition: [facility] [address] [place], [place] [address]   Discharge Condition: Stable. Recovering well from surgery.  Discharge Exam:  VITALS: Reviewed. GEN: Healthy appearing, older woman who appears younger than stated age. Asleep, but easily awoken.  NAD. CV: RRR, no  m/r/g. LUNGS: CTAB, no w/r. Pt did report new non-productive cough. Using the incentive spirometer through the day. ABD: Soft, NT/ND, NBS. GU: N/A SKIN: Warm, well perfused. No skin rashes or abnormal lesions. MSK: Pt has surgical scar on R femur at site of surgical repair. Wound is closed and appears to be well. Area around it is non-tender to palpation.  EXT: No clubbing, cyanosis, or edema. Pt has full movement of leg below injury.  NEURO:  No pain or changes to sensation. Normal muscle strength and tone. No focal deficits.  PSYCH: Good Judgment. AOx3. Normal memory, anxious mood and affect.   Significant Procedures: Open reduction and fixation CPT 27511-Open reduction internal fixation of right periprosthetic distal femur fracture   Surgeons : Primary: Roby Lofts, MD Assistant: Ulyses Southward, PA-C   Significant Labs and Imaging:  Recent Labs  Lab 03/02/23 0842 03/03/23 0603  WBC 6.4 5.8  HGB 8.8* 8.1*  HCT 27.2* 24.5*  PLT 170 218   Recent Labs  Lab 03/02/23 0842 03/03/23 0603  NA 137 138  K 4.0 3.9  CL 103 101  CO2 27 25  GLUCOSE 111* 120*  BUN 38* 40*  CREATININE 1.15* 1.17*  CALCIUM 8.4* 8.2*  ALKPHOS  --  80  AST  --  27  ALT  --  15  ALBUMIN  --  2.9*    Latest Reference Range & Units 02/27/23 07:42 02/28/23 09:47 03/02/23 08:42 03/03/23 06:03  Hemoglobin 12.0 - 15.0 g/dL 25.9 (L) 56.3 (L) 8.8 (L) 8.1 (L)  (L): Data is abnormally low Pt has downward trend in hemoglobin.  Still hemodynamically stable.   Latest Reference Range & Units 02/27/23 07:42 02/28/23 09:47 03/01/23 05:01 03/02/23 08:42 03/03/23 06:03  Calcium 8.9 - 10.3  mg/dL 9.0 8.6 (L) 8.7 (L) 8.4 (L) 8.2 (L)  (L): Data is abnormally low  Results/Tests Pending at Time of Discharge: NA  Discharge Medications:  Allergies as of 03/03/2023       Reactions   Ace Inhibitors Cough   Codeine Nausea Only    GI Intolerance   Erythromycin Other (See Comments)   Stomach pain   Iohexol Hives     Code: HIVES, Desc: patient states develops hives w/ iv contrast/mms   Iodinated Contrast Media Other (See Comments)   Hives        Medication List     STOP taking these medications    Magnesium 250 MG Tabs       TAKE these medications    acetaminophen 650 MG CR tablet Commonly known as: TYLENOL Take 1,300 mg by mouth 2 (two) times daily.   Biofreeze 4 % Gel Generic drug: Menthol (Topical Analgesic) Apply 1 application. topically daily as needed (pain).   citalopram 20 MG tablet Commonly known as: CELEXA Take 20 mg by mouth daily.   Co Q-10 100 MG Caps Take 100 mg by mouth at bedtime.   colestipol 5 g packet Commonly known as: COLESTID Take 5 g by mouth 2 (two) times daily.   docusate sodium 100 MG capsule Commonly known as: COLACE Take 1 capsule (100 mg total) by mouth 2 (two) times daily.   enoxaparin 40 MG/0.4ML injection Commonly known as: LOVENOX Inject 0.4 mLs (40 mg total) into the skin daily for 4 days.   ferrous sulfate 325 (65 FE) MG EC tablet Take 1 tablet (325 mg total) by mouth every other day.   fexofenadine 180 MG tablet Commonly known as: ALLEGRA Take 180 mg by mouth at bedtime.   methocarbamol 500 MG tablet Commonly known as: ROBAXIN Take 1 tablet (500 mg total) by mouth every 6 (six) hours as needed for muscle spasms.   metoprolol succinate 50 MG 24 hr tablet Commonly known as: TOPROL-XL TAKE 2 TABLETS BY MOUTH 2 TIMES DAILY.   multivitamin with minerals tablet Take 1 tablet by mouth daily. 50+ for her What changed: Another medication with the same name was removed. Continue taking this medication, and follow the directions you see here.   oxyCODONE 5 MG immediate release tablet Commonly known as: Oxy IR/ROXICODONE Take 1 tablet (5 mg total) by mouth every 4 (four) hours as needed for moderate pain or severe pain.   potassium chloride 10 MEQ tablet Commonly known as: KLOR-CON Take 10 mEq by mouth at bedtime.   RABEprazole 20  MG tablet Commonly known as: ACIPHEX Take 20 mg by mouth daily.   torsemide 20 MG tablet Commonly known as: DEMADEX Take 20 mg by mouth daily.   valsartan 160 MG tablet Commonly known as: DIOVAN Take 160 mg by mouth at bedtime.   Vitamin D-3 125 MCG (5000 UT) Tabs Take 5,000 Units by mouth every Monday, Wednesday, and Friday.   warfarin 5 MG tablet Commonly known as: COUMADIN Take as directed. If you are unsure how to take this medication, talk to your nurse or doctor. Original instructions: 2.5mg  PO daily every day, except 5mg  PO daily on Monday and Friday What changed: See the new instructions.        Discharge Instructions: Please refer to Patient Instructions section of EMR for full details.  Patient was counseled important signs and symptoms that should prompt return to medical care, changes in medications, dietary instructions, activity restrictions, and follow up appointments.  Follow-Up Appointments:  Contact information for follow-up providers     [place], [facility] Follow up.   Why: [facility] will be providing home health services.  They will call you to set up services in the next 24-48 hours after you are discharged home. Contact information: [address] [place] [address] [phone]         Haddix, Gillie Manners, MD. Schedule an appointment as soon as possible for a visit in 2 week(s).   Specialty: Orthopedic Surgery Why: for wound check and repeat x-rays Contact information: [address] [place] [address] [phone]         Emilio Aspen, MD. Call in 1 week(s).   Specialty: Internal Medicine Contact information: [address] [place] [address] [phone]              Contact information for after-discharge care     Destination     [facility] Preferred SNF .   Service: Skilled Nursing Contact information: [address] [place] [address] [phone]                     Lincoln Brigham, MD 03/03/2023, 12:15 PM PGY-1, [facility]

## 2023-03-02 NOTE — Progress Notes (Addendum)
ANTICOAGULATION CONSULT NOTE - Initial Consult  Pharmacy Consult for Warfarin Indication: atrial fibrillation  Allergies  Allergen Reactions   Ace Inhibitors Cough   Codeine Nausea Only     GI Intolerance   Erythromycin Other (See Comments)    Stomach pain    Iohexol Hives     Code: HIVES, Desc: patient states develops hives w/ iv contrast/mms    Iodinated Contrast Media Other (See Comments)    Hives    Patient Measurements: Height: 5\' 5"  (165.1 cm) Weight: 81.6 kg (180 lb) IBW/kg (Calculated) : 57  Vital Signs: Temp: 98.1 F (36.7 C) (09/11 0729) Temp Source: Oral (09/11 0729) BP: 127/60 (09/11 0729) Pulse Rate: 94 (09/11 0729)  Labs: Recent Labs    02/28/23 0947 02/28/23 1035 03/01/23 0501 03/02/23 0842  HGB 10.4*  --   --  8.8*  HCT 32.1*  --   --  27.2*  PLT 217  --   --  170  LABPROT  --  30.0* 16.8* 14.8  INR  --  2.8* 1.3* 1.1  CREATININE 1.26*  --  1.19* 1.15*    Estimated Creatinine Clearance: 42.5 mL/min (A) (by C-G formula based on SCr of 1.15 mg/dL (H)).   Medical History: Past Medical History:  Diagnosis Date   Anxiety    Arthritis    "joints" (09/19/2013)   Atrial fibrillation (HCC)    Basal cell carcinoma of cheek    left   CHF (congestive heart failure) (HCC)    Chronic lower back pain    Dysrhythmia    GERD (gastroesophageal reflux disease)    H/O hiatal hernia    Hypertension    Migraine    "haven't had any in a long time" (09/19/2013)   Pneumonia ~ 2012    Medications:  Scheduled:   acetaminophen  650 mg Oral Q6H   Chlorhexidine Gluconate Cloth  6 each Topical Daily   cholecalciferol  5,000 Units Oral Daily   citalopram  20 mg Oral Daily   colestipol  5 g Oral BID   docusate sodium  100 mg Oral BID   enoxaparin (LOVENOX) injection  40 mg Subcutaneous Q24H   irbesartan  150 mg Oral Daily   loratadine  10 mg Oral Daily   metoprolol succinate  100 mg Oral BID   multivitamin  1 tablet Oral Daily   multivitamin with minerals   1 tablet Oral Daily   pantoprazole  40 mg Oral Daily   polyethylene glycol  17 g Oral Daily   torsemide  20 mg Oral Daily   Warfarin - Pharmacist Dosing Inpatient   Does not apply q1600   Assessment: 76YOF with atrial fibrillation on warfarin 2.5mg  daily, except 5mg  M/F who presented with a femur fracture after a fall. Warfarin was held on admission due to ORIF on 9/9. Since INR was 2.8 on day of procedure, patient received vitamin K 2.5mg  IV x1. Pharmacy has been consulted for warfarin dosing after receiving ortho clearance. After discussion with FM team, no need to bridge patient with full AC while awaiting therapeutic INR.  INR is subtherapeutic at 1.1. Usual home dose for today would typically be 2.5mg , however will give higher dose.  Goal of Therapy:  INR 2-3 Monitor platelets by anticoagulation protocol: Yes   Plan:  Give warfarin 5mg  x1 Continue enoxaparin 40mg  daily until INR > 2 F/u daily INR and CBC  Nicole Kindred, PharmD PGY1 Pharmacy Resident 03/02/2023 1:02 PM

## 2023-03-02 NOTE — NC FL2 (Signed)
MEDICAID FL2 LEVEL OF CARE FORM     IDENTIFICATION  Patient Name: Sierra [name] Birthdate: 03/10/1946 Sex: female Admission Date (Current Location): 02/26/2023  [facility] and [place] Number:  Producer, television/film/video and Address:  [place], [address], [place], [place] [address]      Provider Number: [id number]  Attending Physician Name and Address:  Latrelle Dodrill, MD  Relative Name and Phone Number:  Januari Janos, spouse - [phone]    Current Level of Care: Hospital Recommended Level of Care: Skilled Nursing Facility Prior Approval Number:    Date Approved/Denied:   PASRR Number: [id number]  Discharge Plan: SNF    Current Diagnoses: Patient Active Problem List   Diagnosis Date Noted   Status post type I or II open fracture of femur 02/26/2023   Primary osteoarthritis of right knee 09/21/2022   Primary localized osteoarthritis of right knee 09/21/2022   S/P reverse total shoulder arthroplasty, left 03/11/2022   Intractable pain 09/07/2021   Weakness 09/06/2021   Back pain 09/06/2021   Low back pain 09/06/2021   Persistent atrial fibrillation (HCC)    Chronic diastolic heart failure (HCC) 05/10/2017   Bilateral leg edema 05/10/2017   Encounter for therapeutic drug monitoring 04/18/2017   Depression 04/07/2017   HLD (hyperlipidemia) 04/07/2017   Arthritis    Essential hypertension 07/29/2015   PAC (premature atrial contraction) 07/29/2015   Atrial fibrillation (HCC) 06/11/2015   Left hip pain 09/19/2013   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    Orientation RESPIRATION BLADDER Height & Weight     Self, Time, Situation, Place  Normal Continent Weight: 81.6 kg Height:  5\' 5"  (165.1 cm)  BEHAVIORAL SYMPTOMS/MOOD NEUROLOGICAL BOWEL NUTRITION STATUS      Continent Diet (See Discharge Summary)  AMBULATORY STATUS COMMUNICATION OF NEEDS Skin   Extensive Assist Verbally Surgical wounds (Right thigh surgical dressing,  S/P femur fracture repair)                       Personal Care Assistance Level of Assistance  Bathing, Feeding, Dressing Bathing Assistance: Limited assistance Feeding assistance: Independent Dressing Assistance: Limited assistance     Functional Limitations Info  Sight, Hearing, Speech Sight Info: Adequate Hearing Info: Adequate Speech Info: Impaired (wears glasses)    SPECIAL CARE FACTORS FREQUENCY  PT (By licensed PT), OT (By licensed OT)     PT Frequency: 5 x per week OT Frequency: 5 x per week            Contractures Contractures Info: Not present    Additional Factors Info  Code Status, Allergies, Psychotropic Code Status Info: Full Code Allergies Info: Ace Inhibitors, Codeine, Erythromycin, Iohexol, Iodinated contrast media Psychotropic Info: Celexa         Current Medications (03/02/2023):  This is the current hospital active medication list Current Facility-Administered Medications  Medication Dose Route Frequency Provider Last Rate Last Admin   0.9 %  sodium chloride infusion   Intravenous Continuous West Bali, PA-C 10 mL/hr at 03/01/23 0435 New Bag at 03/01/23 0435   acetaminophen (TYLENOL) tablet 650 mg  650 mg Oral Q6H West Bali, PA-C   650 mg at 03/02/23 1001   Chlorhexidine Gluconate Cloth 2 % PADS 6 each  6 each Topical Daily Latrelle Dodrill, MD   6 each at 03/02/23 1003   cholecalciferol (VITAMIN D3) 25 MCG (1000 UNIT) tablet 5,000 Units  5,000 Units Oral Daily  West Bali, PA-C   5,000 Units at 03/02/23 1001   citalopram (CELEXA) tablet 20 mg  20 mg Oral Daily West Bali, PA-C   20 mg at 03/02/23 1003   colestipol (COLESTID) tablet 5 g  5 g Oral BID West Bali, PA-C   5 g at 03/02/23 1308   diphenhydrAMINE (BENADRYL) 12.5 MG/5ML elixir 12.5-25 mg  12.5-25 mg Oral Q4H PRN West Bali, PA-C       docusate sodium (COLACE) capsule 100 mg  100 mg Oral BID West Bali, PA-C   100 mg at 03/02/23 1001    enoxaparin (LOVENOX) injection 40 mg  40 mg Subcutaneous Q24H Jenita Seashore, RPH   40 mg at 03/02/23 1001   irbesartan (AVAPRO) tablet 150 mg  150 mg Oral Daily West Bali, PA-C   150 mg at 03/02/23 1003   loratadine (CLARITIN) tablet 10 mg  10 mg Oral Daily West Bali, PA-C   10 mg at 03/02/23 1002   methocarbamol (ROBAXIN) tablet 500 mg  500 mg Oral Q6H PRN West Bali, PA-C       Or   methocarbamol (ROBAXIN) 500 mg in dextrose 5 % 50 mL IVPB  500 mg Intravenous Q6H PRN McClung, Sarah A, PA-C       metoCLOPramide (REGLAN) tablet 5-10 mg  5-10 mg Oral Q8H PRN Sharon Seller, Sarah A, PA-C       Or   metoCLOPramide (REGLAN) injection 5-10 mg  5-10 mg Intravenous Q8H PRN West Bali, PA-C       metoprolol succinate (TOPROL-XL) 24 hr tablet 100 mg  100 mg Oral BID West Bali, PA-C   100 mg at 03/02/23 1002   multivitamin (PROSIGHT) tablet 1 tablet  1 tablet Oral Daily West Bali, PA-C   1 tablet at 03/02/23 1000   multivitamin with minerals tablet 1 tablet  1 tablet Oral Daily West Bali, PA-C   1 tablet at 03/02/23 1004   ondansetron (ZOFRAN) tablet 4 mg  4 mg Oral Q6H PRN West Bali, PA-C       Or   ondansetron (ZOFRAN) injection 4 mg  4 mg Intravenous Q6H PRN Sharon Seller, Sarah A, PA-C       oxyCODONE (Oxy IR/ROXICODONE) immediate release tablet 5 mg  5 mg Oral Q4H PRN West Bali, PA-C   5 mg at 03/02/23 1309   pantoprazole (PROTONIX) EC tablet 40 mg  40 mg Oral Daily Thyra Breed A, PA-C   40 mg at 03/02/23 1002   polyethylene glycol (MIRALAX / GLYCOLAX) packet 17 g  17 g Oral Daily West Bali, PA-C   17 g at 03/02/23 1001   torsemide (DEMADEX) tablet 20 mg  20 mg Oral Daily Thyra Breed A, PA-C   20 mg at 03/02/23 1002   warfarin (COUMADIN) tablet 5 mg  5 mg Oral ONCE-1600 Jenita Seashore, [facility]       Warfarin - Pharmacist Dosing Inpatient   Does not apply q1600 Jenita Seashore [facility]   Given at 03/01/23 1548     Discharge  Medications: Please see discharge summary for a list of discharge medications.  Relevant Imaging Results:  Relevant Lab Results:   Additional Information SS# [id number]  Janae Bridgeman, RN

## 2023-03-02 NOTE — Progress Notes (Cosign Needed Addendum)
Orthopaedic Trauma Progress Note  SUBJECTIVE: Doing okay this morning.  Surgical pain controlled. Notes pain/discomfort through the right calf. Has not been up with therapies yet today. Is about to eat breakfast and take her morning meds. Patient currently on a heart healthy diet, asking to be switched back to a regular diet.  No chest pain. No SOB. No nausea/vomiting. No other complaints.  Tolerating diet and fluids. NO BM since admission  OBJECTIVE:  Vitals:   03/02/23 0500 03/02/23 0729  BP: 113/60 127/60  Pulse: 98 94  Resp:  18  Temp: 98.4 F (36.9 C) 98.1 F (36.7 C)  SpO2: 93% 95%    General: Sitting up in bed, no acute distress Respiratory: No increased work of breathing.  RLE: Dressing removed, incisions clean, dry, intact.  Mild bruising around incisions. Mild tenderness throughout the distal thigh as expected.  Significant calf tenderness  with palpation. Ankle dorsiflexion/plantarflexion are intact.  Able to wiggle toes.  + EHL/FHL.  Foot warm well-perfused.  Compartment soft compressible.  Endorses sensation to light touch throughout all aspects of the foot. +DP pulse  IMAGING: Stable post op imaging.   LABS:  Results for orders placed or performed during the hospital encounter of 02/26/23 (from the past 24 hour(s))  CBC     Status: Abnormal   Collection Time: 03/02/23  8:42 AM  Result Value Ref Range   WBC 6.4 4.0 - 10.5 K/uL   RBC 2.86 (L) 3.87 - 5.11 MIL/uL   Hemoglobin 8.8 (L) 12.0 - 15.0 g/dL   HCT 44.0 (L) 34.7 - 42.5 %   MCV 95.1 80.0 - 100.0 fL   MCH 30.8 26.0 - 34.0 pg   MCHC 32.4 30.0 - 36.0 g/dL   RDW 95.6 38.7 - 56.4 %   Platelets 170 150 - 400 K/uL   nRBC 0.0 0.0 - 0.2 %    ASSESSMENT: Sierra [name] is a 77 y.o. female, 2 Days Post-Op s/p OPEN REDUCTION INTERNAL FIXATION RIGHT DISTAL FEMUR FRACTURE  CV/Blood loss: ABLA. Hemoglobin 8.8 this AM. Hemodynamically stable  PLAN: Weightbearing: WBAT RLE ROM: Unrestricted ROM Incisional and dressing  care: Ok to leave incisions open to air Showering: Okay to begin showering and getting incisions wet 03/03/2023 Orthopedic device(s): None  Pain management:  1. Tylenol 650 mg q 6 hours scheduled 2. Robaxin 500 mg q 6 hours PRN 3. Oxycodone 5 mg q 4 hours PRN 4. Dilaudid 0.5-1 mg q 4 hours PRN VTE prophylaxis: Lovenox bridge to Coumadin.  SCDs ID:  Ancef 2gm post op completed Foley/Lines:  D/C foley this AM. KVO IVFs Impediments to Fracture Healing: Vitamin D level 53, continue home dose of  5000 units vitamin D3 MWF   Dispo: Given significant calf tenderness, will order venous U/S although suspicion for DVT is low given she is on Coumadin and Lovenox. PT/OT evaluation eval ongoing, currently recommending HH therapies. Would like patient to be more mobile prior to discharge. Continue to monitor CBC. I have sent d/c rx for pain medication to the pharmacy on file.   D/C recommendations: -Oxycodone for pain control -Home dose Coumadin for DVT prophylaxis -Resume home dosing of Vit D supplementation  Follow - up plan: 2 weeks after discharge for wound check and repeat x-rays   Contact information:  Truitt Merle MD, Thyra Breed PA-C. After hours and holidays please check [email or web address] for group call information for [facility]   Thompson Caul, PA-C [phone] (office) [email or web address]

## 2023-03-03 ENCOUNTER — Inpatient Hospital Stay (HOSPITAL_COMMUNITY): Payer: Medicare Other

## 2023-03-03 DIAGNOSIS — Z8701 Personal history of pneumonia (recurrent): Secondary | ICD-10-CM | POA: Diagnosis not present

## 2023-03-03 DIAGNOSIS — S72491D Other fracture of lower end of right femur, subsequent encounter for closed fracture with routine healing: Secondary | ICD-10-CM | POA: Diagnosis not present

## 2023-03-03 DIAGNOSIS — I499 Cardiac arrhythmia, unspecified: Secondary | ICD-10-CM | POA: Diagnosis not present

## 2023-03-03 DIAGNOSIS — G8929 Other chronic pain: Secondary | ICD-10-CM | POA: Diagnosis not present

## 2023-03-03 DIAGNOSIS — M6281 Muscle weakness (generalized): Secondary | ICD-10-CM | POA: Diagnosis not present

## 2023-03-03 DIAGNOSIS — R41841 Cognitive communication deficit: Secondary | ICD-10-CM | POA: Diagnosis not present

## 2023-03-03 DIAGNOSIS — I10 Essential (primary) hypertension: Secondary | ICD-10-CM | POA: Diagnosis not present

## 2023-03-03 DIAGNOSIS — F32A Depression, unspecified: Secondary | ICD-10-CM | POA: Diagnosis not present

## 2023-03-03 DIAGNOSIS — D638 Anemia in other chronic diseases classified elsewhere: Secondary | ICD-10-CM | POA: Diagnosis not present

## 2023-03-03 DIAGNOSIS — R531 Weakness: Secondary | ICD-10-CM | POA: Diagnosis not present

## 2023-03-03 DIAGNOSIS — M1990 Unspecified osteoarthritis, unspecified site: Secondary | ICD-10-CM | POA: Diagnosis not present

## 2023-03-03 DIAGNOSIS — Z4789 Encounter for other orthopedic aftercare: Secondary | ICD-10-CM | POA: Diagnosis not present

## 2023-03-03 DIAGNOSIS — K219 Gastro-esophageal reflux disease without esophagitis: Secondary | ICD-10-CM | POA: Diagnosis not present

## 2023-03-03 DIAGNOSIS — Z7401 Bed confinement status: Secondary | ICD-10-CM | POA: Diagnosis not present

## 2023-03-03 DIAGNOSIS — I480 Paroxysmal atrial fibrillation: Secondary | ICD-10-CM | POA: Diagnosis not present

## 2023-03-03 DIAGNOSIS — M545 Low back pain, unspecified: Secondary | ICD-10-CM | POA: Diagnosis not present

## 2023-03-03 DIAGNOSIS — E785 Hyperlipidemia, unspecified: Secondary | ICD-10-CM | POA: Diagnosis not present

## 2023-03-03 DIAGNOSIS — Z85828 Personal history of other malignant neoplasm of skin: Secondary | ICD-10-CM | POA: Diagnosis not present

## 2023-03-03 DIAGNOSIS — I5032 Chronic diastolic (congestive) heart failure: Secondary | ICD-10-CM | POA: Diagnosis not present

## 2023-03-03 DIAGNOSIS — S72301D Unspecified fracture of shaft of right femur, subsequent encounter for closed fracture with routine healing: Secondary | ICD-10-CM | POA: Diagnosis not present

## 2023-03-03 DIAGNOSIS — F419 Anxiety disorder, unspecified: Secondary | ICD-10-CM | POA: Diagnosis not present

## 2023-03-03 DIAGNOSIS — R2681 Unsteadiness on feet: Secondary | ICD-10-CM | POA: Diagnosis not present

## 2023-03-03 DIAGNOSIS — R262 Difficulty in walking, not elsewhere classified: Secondary | ICD-10-CM | POA: Diagnosis not present

## 2023-03-03 DIAGNOSIS — M1711 Unilateral primary osteoarthritis, right knee: Secondary | ICD-10-CM | POA: Diagnosis not present

## 2023-03-03 DIAGNOSIS — Z8781 Personal history of (healed) traumatic fracture: Secondary | ICD-10-CM | POA: Diagnosis not present

## 2023-03-03 DIAGNOSIS — R2689 Other abnormalities of gait and mobility: Secondary | ICD-10-CM | POA: Diagnosis not present

## 2023-03-03 DIAGNOSIS — M79661 Pain in right lower leg: Secondary | ICD-10-CM

## 2023-03-03 DIAGNOSIS — D696 Thrombocytopenia, unspecified: Secondary | ICD-10-CM | POA: Diagnosis not present

## 2023-03-03 DIAGNOSIS — B029 Zoster without complications: Secondary | ICD-10-CM | POA: Diagnosis not present

## 2023-03-03 DIAGNOSIS — Z9181 History of falling: Secondary | ICD-10-CM | POA: Diagnosis not present

## 2023-03-03 DIAGNOSIS — G43919 Migraine, unspecified, intractable, without status migrainosus: Secondary | ICD-10-CM | POA: Diagnosis not present

## 2023-03-03 LAB — CBC
HCT: 24.5 % — ABNORMAL LOW (ref 36.0–46.0)
Hemoglobin: 8.1 g/dL — ABNORMAL LOW (ref 12.0–15.0)
MCH: 31 pg (ref 26.0–34.0)
MCHC: 33.1 g/dL (ref 30.0–36.0)
MCV: 93.9 fL (ref 80.0–100.0)
Platelets: 218 10*3/uL (ref 150–400)
RBC: 2.61 MIL/uL — ABNORMAL LOW (ref 3.87–5.11)
RDW: 14.2 % (ref 11.5–15.5)
WBC: 5.8 10*3/uL (ref 4.0–10.5)
nRBC: 0 % (ref 0.0–0.2)

## 2023-03-03 LAB — COMPREHENSIVE METABOLIC PANEL
ALT: 15 U/L (ref 0–44)
AST: 27 U/L (ref 15–41)
Albumin: 2.9 g/dL — ABNORMAL LOW (ref 3.5–5.0)
Alkaline Phosphatase: 80 U/L (ref 38–126)
Anion gap: 12 (ref 5–15)
BUN: 40 mg/dL — ABNORMAL HIGH (ref 8–23)
CO2: 25 mmol/L (ref 22–32)
Calcium: 8.2 mg/dL — ABNORMAL LOW (ref 8.9–10.3)
Chloride: 101 mmol/L (ref 98–111)
Creatinine, Ser: 1.17 mg/dL — ABNORMAL HIGH (ref 0.44–1.00)
GFR, Estimated: 48 mL/min — ABNORMAL LOW (ref >60)
Glucose, Bld: 120 mg/dL — ABNORMAL HIGH (ref 70–99)
Potassium: 3.9 mmol/L (ref 3.5–5.1)
Sodium: 138 mmol/L (ref 135–145)
Total Bilirubin: 1.2 mg/dL (ref 0.3–1.2)
Total Protein: 5.7 g/dL — ABNORMAL LOW (ref 6.5–8.1)

## 2023-03-03 LAB — PROTIME-INR
INR: 1.4 — ABNORMAL HIGH (ref 0.8–1.2)
Prothrombin Time: 17.3 s — ABNORMAL HIGH (ref 11.4–15.2)

## 2023-03-03 MED ORDER — DOCUSATE SODIUM 100 MG PO CAPS: 100.0000 mg | ORAL_CAPSULE | Freq: Two times a day (BID) | ORAL | 0 refills | Status: DC

## 2023-03-03 MED ORDER — FERROUS SULFATE 325 (65 FE) MG PO TBEC: 325.0000 mg | DELAYED_RELEASE_TABLET | ORAL | Status: DC

## 2023-03-03 MED ORDER — OXYCODONE HCL 5 MG PO TABS: 5.0000 mg | ORAL_TABLET | ORAL | 0 refills | Status: DC | PRN

## 2023-03-03 MED ORDER — ENOXAPARIN SODIUM 40 MG/0.4ML IJ SOSY: 40.0000 mg | PREFILLED_SYRINGE | INTRAMUSCULAR | Status: DC

## 2023-03-03 MED ORDER — METHOCARBAMOL 500 MG PO TABS: 500.0000 mg | ORAL_TABLET | Freq: Four times a day (QID) | ORAL | 0 refills | Status: AC | PRN

## 2023-03-03 MED ORDER — WARFARIN SODIUM 5 MG PO TABS: ORAL_TABLET | ORAL | 1 refills | Status: DC

## 2023-03-03 MED ORDER — WARFARIN SODIUM 2.5 MG PO TABS
2.5000 mg | ORAL_TABLET | Freq: Once | ORAL | Status: AC
  Administered 2023-03-03: 2.5 mg via ORAL
  Filled 2023-03-03: qty 1

## 2023-03-03 NOTE — TOC Transition Note (Addendum)
Transition of Care [facility]) - CM/SW Discharge Note   Patient Details  Name: Sierra [name] MRN: [id number] Date of Birth: December 09, 1945  [facility] CM/SW Contact:  Sutton Plake A [place], Theresia Majors Phone Number: 03/03/2023, 11:13 AM   Clinical Narrative:     Patient will DC to: [facility]  Anticipated DC date: 03/03/23  Family notified: Gordy Savers  Transport by: Sharin Mons      Per MD patient ready for DC to [facility]. RN, patient, patient's family, and facility notified of DC. Discharge Summary and FL2 sent to facility. RN to call report prior to discharge (Room 110, [phone]). DC packet on chart. Ambulance transport requested for patient.     CSW will sign off for now as social work intervention is no longer needed. Please consult [place] again if new needs arise.   Final next level of care: Skilled Nursing Facility Barriers to Discharge: Barriers Resolved   Patient Goals and CMS Choice      Discharge Placement                Patient chooses bed at: [facility] Patient to be transferred to facility by: [facility] Name of family member notified: Ayrah Erdos Patient and family notified of of transfer: 03/03/23  Discharge Plan and Services Additional resources added to the After Visit Summary for                                       Social Determinants of Health (SDOH) Interventions SDOH Screenings   Food Insecurity: No Food Insecurity (02/26/2023)  Housing: Patient Declined (02/26/2023)  Transportation Needs: No Transportation Needs (02/26/2023)  Utilities: Not At Risk (02/26/2023)  Tobacco Use: Low Risk  (02/28/2023)     Readmission Risk Interventions    03/01/2023   11:51 AM  Readmission Risk Prevention Plan  Transportation Screening Complete  PCP or Specialist Appt within 5-7 Days Complete  Home Care Screening Complete  Medication Review (RN CM) Complete

## 2023-03-03 NOTE — Assessment & Plan Note (Signed)
Held Coumadin prior to surgery,. Discuss VTE ppx with surgery - Restarted 5 mg coumadin

## 2023-03-03 NOTE — Plan of Care (Signed)
  Problem: Education: Goal: Knowledge of General Education information will improve Description: Including pain rating scale, medication(s)/side effects and non-pharmacologic comfort measures Outcome: Progressing   Problem: Health Behavior/Discharge Planning: Goal: Ability to manage health-related needs will improve Outcome: Progressing   Problem: Clinical Measurements: Goal: Ability to maintain clinical measurements within normal limits will improve Outcome: Progressing Goal: Will remain free from infection Outcome: Progressing   Problem: Activity: Goal: Risk for activity intolerance will decrease Outcome: Progressing   Problem: Nutrition: Goal: Adequate nutrition will be maintained Outcome: Progressing   Problem: Coping: Goal: Level of anxiety will decrease Outcome: Progressing   Problem: Elimination: Goal: Will not experience complications related to bowel motility Outcome: Progressing   Problem: Pain Managment: Goal: General experience of comfort will improve Outcome: Progressing   Problem: Safety: Goal: Ability to remain free from injury will improve Outcome: Progressing   Problem: Skin Integrity: Goal: Risk for impaired skin integrity will decrease Outcome: Progressing   Problem: Activity: Goal: Ability to avoid complications of mobility impairment will improve Outcome: Progressing   Problem: Clinical Measurements: Goal: Postoperative complications will be avoided or minimized Outcome: Progressing   Problem: Pain Management: Goal: Pain level will decrease with appropriate interventions Outcome: Progressing   Problem: Skin Integrity: Goal: Will show signs of wound healing Outcome: Progressing   Problem: Education: Goal: Knowledge of the prescribed therapeutic regimen will improve Outcome: Progressing   Problem: Activity: Goal: Ability to tolerate increased activity will improve Outcome: Progressing   Problem: Pain Management: Goal: Pain level  will decrease with appropriate interventions Outcome: Progressing

## 2023-03-03 NOTE — Progress Notes (Signed)
Occupational Therapy Treatment Patient Details Name: Sierra [name] MRN: [id number] DOB: 16-Sep-1945 Today's Date: 03/03/2023   History of present illness 77 yo female admitted 9/7 after fall with Rt femur fx. 9/9 ORIf femur. PMhx: HTN, HLD, Afib, depression, Lt reverse TSA, CHF, Bil THA, Rt TKA   OT comments  Pt progressing slowly toward established OT goals and medically stable to dc, thus, updating discharge recommendation to inpatient rehabilitation <3 hours/day to optimize safety and independence prior to return home. Challenging return to ADL, balance, and strength this session. Pt performing toileting and toilet transfer with intermittent min A this session and cues for optimizing techniques. Pt with good carryover of foot placement during transfers. Pt performing LB ADL as well this session. Pt reporting she has sock aid, but very appreciative of education regarding doffing socks. Will continue to follow acutely.       If plan is discharge home, recommend the following:  A lot of help with walking and/or transfers;A lot of help with bathing/dressing/bathroom;Assistance with cooking/housework;Direct supervision/assist for medications management;Direct supervision/assist for financial management;Assist for transportation;Help with stairs or ramp for entrance   Equipment Recommendations  None recommended by OT    Recommendations for Other Services      Precautions / Restrictions Precautions Precautions: Fall Restrictions Weight Bearing Restrictions: Yes RLE Weight Bearing: Weight bearing as tolerated       Mobility Bed Mobility Overal bed mobility: Needs Assistance Bed Mobility: Supine to Sit       Sit to supine: Min assist   General bed mobility comments: light A to perform last 25% of bringing legs into bed    Transfers Overall transfer level: Needs assistance Equipment used: Rolling walker (2 wheels) Transfers: Sit to/from Stand, Bed to chair/wheelchair/BSC Sit to  Stand: Min assist     Step pivot transfers: Contact guard assist, Min assist     General transfer comment: Increaesd time for processing where to put hands but no cues needed this session and pt with good recall to lead with RLE to offload weight. pt needing increased time and light intermittent assist for steadying     Balance Overall balance assessment: Needs assistance Sitting-balance support: Feet supported Sitting balance-Leahy Scale: Good     Standing balance support: Bilateral upper extremity supported, During functional activity Standing balance-Leahy Scale: Poor Standing balance comment: reliant on RW in standing                           ADL either performed or assessed with clinical judgement   ADL Overall ADL's : Needs assistance/impaired                     Lower Body Dressing: Minimal assistance;Sit to/from stand Lower Body Dressing Details (indicate cue type and reason): Min A for management of pants in standing, but CGA for socks with use of AE to don and doff Toilet Transfer: BSC/3in1;Rolling walker (2 wheels);Minimal assistance Toilet Transfer Details (indicate cue type and reason): Min A for steadying only Toileting- Clothing Manipulation and Hygiene: Minimal assistance;Sit to/from stand Toileting - Clothing Manipulation Details (indicate cue type and reason): for thoroughness and management of gown. Performing from standing position with reaching between legs from front to bottom     Functional mobility during ADLs: Minimal assistance;Rolling walker (2 wheels) General ADL Comments: limited by pain and nausea from pain    Extremity/Trunk Assessment Upper Extremity Assessment Upper Extremity Assessment: Generalized weakness   Lower Extremity  Assessment Lower Extremity Assessment: Defer to PT evaluation        Vision   Vision Assessment?: No apparent visual deficits   Perception Perception Perception: Within Functional Limits    Praxis Praxis Praxis: WFL    Cognition Arousal: Alert Behavior During Therapy: WFL for tasks assessed/performed Overall Cognitive Status: Within Functional Limits for tasks assessed                                 General Comments: WFL for basic orientation and functional tasks. Anxious in anticipation of pain/falling. Fair carryover of techniques to offload RLE as needed for comfort        Exercises      Shoulder Instructions       General Comments VSS on RA    Pertinent Vitals/ Pain       Pain Assessment Pain Assessment: Faces Faces Pain Scale: Hurts whole lot Pain Location: RLE and L knee Pain Descriptors / Indicators: Discomfort Pain Intervention(s): Limited activity within patient's tolerance, Monitored during session, Premedicated before session  Home Living                                          Prior Functioning/Environment              Frequency  Min 1X/week        Progress Toward Goals  OT Goals(current goals can now be found in the care plan section)  Progress towards OT goals: Progressing toward goals  Acute Rehab OT Goals Patient Stated Goal: get better OT Goal Formulation: With patient Time For Goal Achievement: 03/15/23 Potential to Achieve Goals: Good ADL Goals Pt Will Perform Lower Body Bathing: with min assist;sit to/from stand Pt Will Perform Lower Body Dressing: with min assist;sit to/from stand Pt Will Transfer to Toilet: with contact guard assist;bedside commode  Plan      Co-evaluation                 AM-PAC OT "6 Clicks" Daily Activity     Outcome Measure   Help from another person eating meals?: None Help from another person taking care of personal grooming?: A Little Help from another person toileting, which includes using toliet, bedpan, or urinal?: A Lot Help from another person bathing (including washing, rinsing, drying)?: A Lot Help from another person to put on and taking  off regular upper body clothing?: A Little Help from another person to put on and taking off regular lower body clothing?: A Lot 6 Click Score: 16    End of Session Equipment Utilized During Treatment: Gait belt;Rolling walker (2 wheels)  OT Visit Diagnosis: Unsteadiness on feet (R26.81);Other abnormalities of gait and mobility (R26.89);Muscle weakness (generalized) (M62.81);Pain   Activity Tolerance Patient tolerated treatment well;Patient limited by pain   Patient Left in bed;with bed alarm set;with call bell/phone within reach   Nurse Communication Mobility status        Time: 3664-4034 OT Time Calculation (min): 28 min  Charges: OT General Charges $OT Visit: 1 Visit OT Treatments $Self Care/Home Management : 23-37 mins  Tyler Deis, OTR/L [facility] Acute Rehabilitation Office: [phone]   Myrla Halsted 03/03/2023, 11:53 AM

## 2023-03-03 NOTE — Progress Notes (Addendum)
     Daily Progress Note Intern Pager: [phone]  Patient name: Sierra [name] Medical record number: [id number] Date of birth: Jun 09, 1946 Age: 77 y.o. Gender: female  Primary Care Provider: Emilio Aspen, MD Consultants: Gaylord Shih, PT, OT Code Status: Full Code  Pt Overview and Major Events to Date:  9/7: Admitted with hip fracture, Ortho consulted 9/8: Open Reduction delayed due to above average surgical risk from anticoagulation 9/9: Open Reduction Internal Fixation completed 9/10: Restarted on warfarin, PT/OT consulted 9/11: Pt still not stooling, consult placed to [facility] for SNF assistance. Pt completed ABX therapy.  Assessment and Plan: Sierra [name] is a 77 y.o. female with past medical history of osteoarthritis s/p multiple shoulder/hip/knee arthroplasties, chronic diastolic heart failure, paroxysmal A-fib, hypertension admitted for oblique angulated fracture of the right distal femur.  Assessment & Plan Status post type I or II open fracture of femur Pain controlled with regimen below. S/p ORIF 9/9 with orthopedics. Pending transport to SNF. - Pain control with: oxycodone 5 mg, Q4, scheduled Tylenol 650 every 6 Atrial fibrillation (HCC) Held Coumadin prior to surgery,. Discuss VTE ppx with surgery - Restarted 5 mg coumadin  Chronic and Stable Problems: A-fib, HTN, HF, depression, allergies, GERD - continue home regimen   FEN/GI: Regular diet PPx: Lovenox Dispo: SNF  Subjective:  Patient is doing well today, pain persists, but understandably due to her recent hip fractures and surgery. Patient is ready to go to SNF today.   Objective: Temp:  [98 F (36.7 C)-98.3 F (36.8 C)] 98.1 F (36.7 C) (09/12 1546) Pulse Rate:  [90-100] 92 (09/12 1546) Resp:  [16-19] 17 (09/12 1546) BP: (122-146)/(50-64) 122/50 (09/12 1546) SpO2:  [94 %-97 %] 96 % (09/12 1546) Physical Exam: per Dr. Weston Settle GEN: Healthy appearing, older woman who appears younger than stated age. Asleep, but  easily awoken.  NAD. CV: RRR, no m/r/g. LUNGS: CTAB, no w/r. Pt did report new non-productive cough. Using the incentive spirometer through the day. ABD: Soft, NT/ND, NBS. GU: N/A SKIN: Warm, well perfused. No skin rashes or abnormal lesions. MSK: Pt has surgical scar on R femur at site of surgical repair. Wound is closed and appears to be well. Area around it is non-tender to palpation.  EXT: No clubbing, cyanosis, or edema. Pt has full movement of leg below injury.  NEURO:  No pain or changes to sensation. Normal muscle strength and tone. No focal deficits.  PSYCH: Good Judgment. AOx3. Normal memory, anxious mood and affect.  Laboratory: Most recent CBC Lab Results  Component Value Date   WBC 5.8 03/03/2023   HGB 8.1 (L) 03/03/2023   HCT 24.5 (L) 03/03/2023   MCV 93.9 03/03/2023   PLT 218 03/03/2023   Most recent BMP    Latest Ref Rng & Units 03/03/2023    6:03 AM  BMP  Glucose 70 - 99 mg/dL 147   BUN 8 - 23 mg/dL 40   Creatinine 8.29 - 1.00 mg/dL 5.62   Sodium 130 - 865 mmol/L 138   Potassium 3.5 - 5.1 mmol/L 3.9   Chloride 98 - 111 mmol/L 101   CO2 22 - 32 mmol/L 25   Calcium 8.9 - 10.3 mg/dL 8.2     Imaging/Diagnostic Tests: No new imaging.  Fortunato Curling, DO 03/03/2023, 6:51 PM  PGY-1, [facility] FPTS Intern pager: [phone], text pages welcome Secure chat group [facility] Teaching Service

## 2023-03-03 NOTE — Assessment & Plan Note (Addendum)
Pain controlled with regimen below. S/p ORIF 9/9 with orthopedics. Pending transport to SNF. - Pain control with: oxycodone 5 mg, Q4, scheduled Tylenol 650 every 6

## 2023-03-03 NOTE — Progress Notes (Signed)
Physical Therapy Treatment Patient Details Name: Sierra [name] MRN: [id number] DOB: 15-Jun-1946 Today's Date: 03/03/2023   History of Present Illness 77 yo female admitted 9/7 after fall with Rt femur fx. 9/9 ORIf femur. PMhx: HTN, HLD, Afib, depression, Lt reverse TSA, CHF, Bil THA, Rt TKA    PT Comments  Pt greeted resting in bed and agreeable to session with good progress towards acute goals. Pt able to progress to gait trial this session in room ~18' with mod A fading to min A with increased distance with RW for support. Pt requiring cues for LE/RW sequencing throughout as well as cues for upright posture and RW proximity to aide in offloading painful RLE. Pt with fair carryover for hand placement to transfer to standing needing min A to complete. Pt continues to be limited by pain, decreased activity tolerance and impaired balance/postural reactions and current plan remains appropriate. Pt continues to benefit from skilled PT services to progress toward functional mobility goals.      If plan is discharge home, recommend the following: A lot of help with walking and/or transfers;A lot of help with bathing/dressing/bathroom;Assist for transportation;Help with stairs or ramp for entrance   Can travel by private vehicle     No  Equipment Recommendations  None recommended by PT    Recommendations for Other Services       Precautions / Restrictions Precautions Precautions: Fall Restrictions Weight Bearing Restrictions: Yes RLE Weight Bearing: Weight bearing as tolerated     Mobility  Bed Mobility Overal bed mobility: Needs Assistance Bed Mobility: Supine to Sit     Supine to sit: Min assist     General bed mobility comments: min A to bring LE to EOB and scoot out fully to EOB    Transfers Overall transfer level: Needs assistance Equipment used: Rolling walker (2 wheels) Transfers: Sit to/from Stand Sit to Stand: Min assist           General transfer comment: light  cues for hand placement and RLE placement for pain mitigation, min A to steady on rise    Ambulation/Gait Ambulation/Gait assistance: Min assist, Mod assist Gait Distance (Feet): 18 Feet Assistive device: Rolling walker (2 wheels) Gait Pattern/deviations: Step-to pattern, Decreased stride length, Decreased weight shift to right, Trunk flexed Gait velocity: decr     General Gait Details: slow step to gait with cues for sequencing, mod A fading to min A with distance and increase in pt confidence, cues for breathing techniques throughout   Stairs             Wheelchair Mobility     Tilt Bed    Modified Rankin (Stroke Patients Only)       Balance Overall balance assessment: Needs assistance Sitting-balance support: Feet supported Sitting balance-Leahy Scale: Good     Standing balance support: Bilateral upper extremity supported, During functional activity Standing balance-Leahy Scale: Poor Standing balance comment: reliant on RW in standing                            Cognition Arousal: Alert Behavior During Therapy: WFL for tasks assessed/performed Overall Cognitive Status: Within Functional Limits for tasks assessed                                 General Comments: [facility] for basic orientation and functional tasks. Anxious in anticipation of pain/falling.  Exercises      General Comments General comments (skin integrity, edema, etc.): VSS on RA      Pertinent Vitals/Pain Pain Assessment Pain Assessment: Faces Faces Pain Scale: Hurts whole lot Pain Location: RLE and L knee Pain Descriptors / Indicators: Discomfort Pain Intervention(s): Premedicated before session, Monitored during session, Limited activity within patient's tolerance    Home Living                          Prior Function            PT Goals (current goals can now be found in the care plan section) Acute Rehab PT Goals PT Goal Formulation:  With patient Time For Goal Achievement: 03/15/23 Progress towards PT goals: Progressing toward goals    Frequency    Min 1X/week      PT Plan      Co-evaluation              AM-PAC PT "6 Clicks" Mobility   Outcome Measure  Help needed turning from your back to your side while in a flat bed without using bedrails?: A Little Help needed moving from lying on your back to sitting on the side of a flat bed without using bedrails?: A Lot Help needed moving to and from a bed to a chair (including a wheelchair)?: A Lot Help needed standing up from a chair using your arms (e.g., wheelchair or bedside chair)?: A Lot Help needed to walk in hospital room?: Total Help needed climbing 3-5 steps with a railing? : Total 6 Click Score: 11    End of Session Equipment Utilized During Treatment: Gait belt Activity Tolerance: Patient tolerated treatment well Patient left: with call bell/phone within reach;Other (comment) (on [facility] with pt verbalizing understanding to call when finished) Nurse Communication: Mobility status PT Visit Diagnosis: Other abnormalities of gait and mobility (R26.89);Muscle weakness (generalized) (M62.81)     Time: 4098-1191 PT Time Calculation (min) (ACUTE ONLY): 18 min  Charges:    $Gait Training: 8-22 mins PT General Charges $$ ACUTE PT VISIT: 1 Visit                     Janeisha Ryle R. PTA Acute Rehabilitation Services Office: [phone]   Catalina Antigua 03/03/2023, 12:01 PM

## 2023-03-03 NOTE — Progress Notes (Signed)
Bilateral lower extremity venous duplex has been completed. Preliminary results can be found in CV Proc through chart review.   03/03/23 3:54 PM Olen Cordial RVT

## 2023-03-03 NOTE — Progress Notes (Addendum)
ANTICOAGULATION CONSULT NOTE - Initial Consult  Pharmacy Consult for Warfarin Indication: atrial fibrillation  Allergies  Allergen Reactions   Ace Inhibitors Cough   Codeine Nausea Only     GI Intolerance   Erythromycin Other (See Comments)    Stomach pain    Iohexol Hives     Code: HIVES, Desc: patient states develops hives w/ iv contrast/mms    Iodinated Contrast Media Other (See Comments)    Hives    Patient Measurements: Height: 5\' 5"  (165.1 cm) Weight: 81.6 kg (180 lb) IBW/kg (Calculated) : 57  Vital Signs: Temp: 98 F (36.7 C) (09/12 0727) Temp Source: Oral (09/12 0727) BP: 123/52 (09/12 0727) Pulse Rate: 90 (09/12 0727)  Labs: Recent Labs    03/01/23 0501 03/02/23 0842 03/03/23 0603  HGB  --  8.8* 8.1*  HCT  --  27.2* 24.5*  PLT  --  170 218  LABPROT 16.8* 14.8 17.3*  INR 1.3* 1.1 1.4*  CREATININE 1.19* 1.15* 1.17*    Estimated Creatinine Clearance: 41.8 mL/min (A) (by C-G formula based on SCr of 1.17 mg/dL (H)).   Medical History: Past Medical History:  Diagnosis Date   Anxiety    Arthritis    "joints" (09/19/2013)   Atrial fibrillation (HCC)    Basal cell carcinoma of cheek    left   CHF (congestive heart failure) (HCC)    Chronic lower back pain    Dysrhythmia    GERD (gastroesophageal reflux disease)    H/O hiatal hernia    Hypertension    Migraine    "haven't had any in a long time" (09/19/2013)   Pneumonia ~ 2012    Medications:  Scheduled:   acetaminophen  650 mg Oral Q6H   Chlorhexidine Gluconate Cloth  6 each Topical Daily   cholecalciferol  5,000 Units Oral Daily   citalopram  20 mg Oral Daily   colestipol  5 g Oral BID   docusate sodium  100 mg Oral BID   enoxaparin (LOVENOX) injection  40 mg Subcutaneous Q24H   irbesartan  150 mg Oral Daily   loratadine  10 mg Oral Daily   metoprolol succinate  100 mg Oral BID   multivitamin  1 tablet Oral Daily   multivitamin with minerals  1 tablet Oral Daily   pantoprazole  40 mg  Oral Daily   polyethylene glycol  17 g Oral Daily   torsemide  20 mg Oral Daily   Warfarin - Pharmacist Dosing Inpatient   Does not apply q1600   Assessment: 76YOF with atrial fibrillation on warfarin 2.5mg  daily, except 5mg  M/F who presented with a femur fracture after a fall. Warfarin was held on admission due to ORIF on 9/9. Since INR was 2.8 on day of procedure, patient received vitamin K 2.5mg  IV x1. Pharmacy has been consulted for warfarin dosing after receiving ortho clearance. After discussion with FM team, no need to bridge patient with full AC while awaiting therapeutic INR.  INR increased from 1.1 to 1.4 but remains subtherapeutic. This is an appropriate increase, therefore will give usual home warfarin dose of 2.5mg  today.  Patient will be discharging to [facility] today. Will have patient continue home warfarin dose on discharge and remain on prophylactic enoxaparin dosing until INR is therapeutic.  Goal of Therapy:  INR 2-3 Monitor platelets by anticoagulation protocol: Yes   Plan:  Initiate warfarin dose today of 2.5mg  daily except for 5mg  on Monday/Friday, and continue on discharge Continue enoxaparin 40mg  daily until  INR > 2 SNF to obtain INR tomorrow (9/13) and Monday (9/16)  Nicole Kindred, PharmD PGY1 Pharmacy Resident 03/03/2023 11:27 AM

## 2023-03-04 DIAGNOSIS — Z4789 Encounter for other orthopedic aftercare: Secondary | ICD-10-CM | POA: Diagnosis not present

## 2023-03-04 DIAGNOSIS — I5032 Chronic diastolic (congestive) heart failure: Secondary | ICD-10-CM | POA: Diagnosis not present

## 2023-03-04 DIAGNOSIS — I480 Paroxysmal atrial fibrillation: Secondary | ICD-10-CM | POA: Diagnosis not present

## 2023-03-04 DIAGNOSIS — F419 Anxiety disorder, unspecified: Secondary | ICD-10-CM | POA: Diagnosis not present

## 2023-03-04 LAB — TYPE AND SCREEN
ABO/RH(D): A POS
Antibody Screen: NEGATIVE
Unit division: 0
Unit division: 0

## 2023-03-04 LAB — BPAM RBC
Blood Product Expiration Date: 202410032359
Blood Product Expiration Date: 202410042359
Unit Type and Rh: 6200
Unit Type and Rh: 6200

## 2023-03-07 DIAGNOSIS — F32A Depression, unspecified: Secondary | ICD-10-CM | POA: Diagnosis not present

## 2023-03-07 DIAGNOSIS — Z9181 History of falling: Secondary | ICD-10-CM | POA: Diagnosis not present

## 2023-03-07 DIAGNOSIS — I480 Paroxysmal atrial fibrillation: Secondary | ICD-10-CM | POA: Diagnosis not present

## 2023-03-07 DIAGNOSIS — R2689 Other abnormalities of gait and mobility: Secondary | ICD-10-CM | POA: Diagnosis not present

## 2023-03-07 DIAGNOSIS — I5032 Chronic diastolic (congestive) heart failure: Secondary | ICD-10-CM | POA: Diagnosis not present

## 2023-03-07 DIAGNOSIS — R2681 Unsteadiness on feet: Secondary | ICD-10-CM | POA: Diagnosis not present

## 2023-03-07 DIAGNOSIS — M6281 Muscle weakness (generalized): Secondary | ICD-10-CM | POA: Diagnosis not present

## 2023-03-07 DIAGNOSIS — D638 Anemia in other chronic diseases classified elsewhere: Secondary | ICD-10-CM | POA: Diagnosis not present

## 2023-03-07 DIAGNOSIS — S72491D Other fracture of lower end of right femur, subsequent encounter for closed fracture with routine healing: Secondary | ICD-10-CM | POA: Diagnosis not present

## 2023-03-09 ENCOUNTER — Other Ambulatory Visit: Payer: Self-pay | Admitting: *Deleted

## 2023-03-09 NOTE — Patient Outreach (Signed)
Mrs. Landeros resides in [facility]. Screening for potential care coordination/ chronic care management services as a benefit of health plan and primary care provider.   Collaboration with [facility] psychotherapist. Mrs. Peak is from home with spouse.   Will plan to follow for potential care coordination/chronic care management needs.   Raiford Noble, MSN, RN, BSN [place]  [facility], [facility] RN Post- Acute Care Coordinator Direct Dial: [phone]

## 2023-03-10 DIAGNOSIS — M6281 Muscle weakness (generalized): Secondary | ICD-10-CM | POA: Diagnosis not present

## 2023-03-10 DIAGNOSIS — R2689 Other abnormalities of gait and mobility: Secondary | ICD-10-CM | POA: Diagnosis not present

## 2023-03-10 DIAGNOSIS — I5032 Chronic diastolic (congestive) heart failure: Secondary | ICD-10-CM | POA: Diagnosis not present

## 2023-03-10 DIAGNOSIS — S72491D Other fracture of lower end of right femur, subsequent encounter for closed fracture with routine healing: Secondary | ICD-10-CM | POA: Diagnosis not present

## 2023-03-10 DIAGNOSIS — Z9181 History of falling: Secondary | ICD-10-CM | POA: Diagnosis not present

## 2023-03-10 DIAGNOSIS — R2681 Unsteadiness on feet: Secondary | ICD-10-CM | POA: Diagnosis not present

## 2023-03-10 DIAGNOSIS — I480 Paroxysmal atrial fibrillation: Secondary | ICD-10-CM | POA: Diagnosis not present

## 2023-03-14 DIAGNOSIS — I5032 Chronic diastolic (congestive) heart failure: Secondary | ICD-10-CM | POA: Diagnosis not present

## 2023-03-14 DIAGNOSIS — R2681 Unsteadiness on feet: Secondary | ICD-10-CM | POA: Diagnosis not present

## 2023-03-14 DIAGNOSIS — D638 Anemia in other chronic diseases classified elsewhere: Secondary | ICD-10-CM | POA: Diagnosis not present

## 2023-03-14 DIAGNOSIS — M6281 Muscle weakness (generalized): Secondary | ICD-10-CM | POA: Diagnosis not present

## 2023-03-14 DIAGNOSIS — I480 Paroxysmal atrial fibrillation: Secondary | ICD-10-CM | POA: Diagnosis not present

## 2023-03-14 DIAGNOSIS — R2689 Other abnormalities of gait and mobility: Secondary | ICD-10-CM | POA: Diagnosis not present

## 2023-03-14 DIAGNOSIS — Z9181 History of falling: Secondary | ICD-10-CM | POA: Diagnosis not present

## 2023-03-14 DIAGNOSIS — S72491D Other fracture of lower end of right femur, subsequent encounter for closed fracture with routine healing: Secondary | ICD-10-CM | POA: Diagnosis not present

## 2023-03-17 DIAGNOSIS — R2689 Other abnormalities of gait and mobility: Secondary | ICD-10-CM | POA: Diagnosis not present

## 2023-03-17 DIAGNOSIS — I5032 Chronic diastolic (congestive) heart failure: Secondary | ICD-10-CM | POA: Diagnosis not present

## 2023-03-17 DIAGNOSIS — R2681 Unsteadiness on feet: Secondary | ICD-10-CM | POA: Diagnosis not present

## 2023-03-17 DIAGNOSIS — M6281 Muscle weakness (generalized): Secondary | ICD-10-CM | POA: Diagnosis not present

## 2023-03-17 DIAGNOSIS — S72491D Other fracture of lower end of right femur, subsequent encounter for closed fracture with routine healing: Secondary | ICD-10-CM | POA: Diagnosis not present

## 2023-03-17 DIAGNOSIS — I480 Paroxysmal atrial fibrillation: Secondary | ICD-10-CM | POA: Diagnosis not present

## 2023-03-17 DIAGNOSIS — Z9181 History of falling: Secondary | ICD-10-CM | POA: Diagnosis not present

## 2023-03-21 DIAGNOSIS — I480 Paroxysmal atrial fibrillation: Secondary | ICD-10-CM | POA: Diagnosis not present

## 2023-03-21 DIAGNOSIS — Z9181 History of falling: Secondary | ICD-10-CM | POA: Diagnosis not present

## 2023-03-21 DIAGNOSIS — S72491D Other fracture of lower end of right femur, subsequent encounter for closed fracture with routine healing: Secondary | ICD-10-CM | POA: Diagnosis not present

## 2023-03-21 DIAGNOSIS — R2681 Unsteadiness on feet: Secondary | ICD-10-CM | POA: Diagnosis not present

## 2023-03-21 DIAGNOSIS — I5032 Chronic diastolic (congestive) heart failure: Secondary | ICD-10-CM | POA: Diagnosis not present

## 2023-03-21 DIAGNOSIS — R2689 Other abnormalities of gait and mobility: Secondary | ICD-10-CM | POA: Diagnosis not present

## 2023-03-21 DIAGNOSIS — M6281 Muscle weakness (generalized): Secondary | ICD-10-CM | POA: Diagnosis not present

## 2023-03-22 DIAGNOSIS — I480 Paroxysmal atrial fibrillation: Secondary | ICD-10-CM | POA: Diagnosis not present

## 2023-03-22 DIAGNOSIS — D638 Anemia in other chronic diseases classified elsewhere: Secondary | ICD-10-CM | POA: Diagnosis not present

## 2023-03-22 DIAGNOSIS — S72491D Other fracture of lower end of right femur, subsequent encounter for closed fracture with routine healing: Secondary | ICD-10-CM | POA: Diagnosis not present

## 2023-03-22 DIAGNOSIS — S72301D Unspecified fracture of shaft of right femur, subsequent encounter for closed fracture with routine healing: Secondary | ICD-10-CM | POA: Diagnosis not present

## 2023-03-22 DIAGNOSIS — I5032 Chronic diastolic (congestive) heart failure: Secondary | ICD-10-CM | POA: Diagnosis not present

## 2023-03-23 DIAGNOSIS — I480 Paroxysmal atrial fibrillation: Secondary | ICD-10-CM | POA: Diagnosis not present

## 2023-03-24 ENCOUNTER — Other Ambulatory Visit: Payer: Self-pay | Admitting: *Deleted

## 2023-03-24 DIAGNOSIS — Z9181 History of falling: Secondary | ICD-10-CM | POA: Diagnosis not present

## 2023-03-24 DIAGNOSIS — S72491D Other fracture of lower end of right femur, subsequent encounter for closed fracture with routine healing: Secondary | ICD-10-CM | POA: Diagnosis not present

## 2023-03-24 DIAGNOSIS — R2681 Unsteadiness on feet: Secondary | ICD-10-CM | POA: Diagnosis not present

## 2023-03-24 DIAGNOSIS — I5032 Chronic diastolic (congestive) heart failure: Secondary | ICD-10-CM | POA: Diagnosis not present

## 2023-03-24 DIAGNOSIS — M6281 Muscle weakness (generalized): Secondary | ICD-10-CM | POA: Diagnosis not present

## 2023-03-24 DIAGNOSIS — I480 Paroxysmal atrial fibrillation: Secondary | ICD-10-CM | POA: Diagnosis not present

## 2023-03-24 DIAGNOSIS — R2689 Other abnormalities of gait and mobility: Secondary | ICD-10-CM | POA: Diagnosis not present

## 2023-03-24 NOTE — Patient Outreach (Signed)
Late entry for 03/23/23. Post-Acute Care Coordinator follow up. Sierra [name] resides in [facility] skilled [facility].  Screening for potential care coordination/ chronic care management services as a benefit of health plan and primary care provider.  Collaboration visit with [name] social worker. Sierra [name] will return home with spouse.   Went to bedside to speak with Sierra [name]. However, she was off the unit.   Will continue to follow for potential chronic care management/ care coordination needs.   Raiford Noble, MSN, RN, BSN [place]  [facility], [facility] RN Post- Acute Care Coordinator Direct Dial: [phone]

## 2023-03-25 DIAGNOSIS — I480 Paroxysmal atrial fibrillation: Secondary | ICD-10-CM | POA: Diagnosis not present

## 2023-03-26 DIAGNOSIS — I5032 Chronic diastolic (congestive) heart failure: Secondary | ICD-10-CM | POA: Diagnosis not present

## 2023-03-26 DIAGNOSIS — D638 Anemia in other chronic diseases classified elsewhere: Secondary | ICD-10-CM | POA: Diagnosis not present

## 2023-03-26 DIAGNOSIS — I480 Paroxysmal atrial fibrillation: Secondary | ICD-10-CM | POA: Diagnosis not present

## 2023-03-26 DIAGNOSIS — S72491D Other fracture of lower end of right femur, subsequent encounter for closed fracture with routine healing: Secondary | ICD-10-CM | POA: Diagnosis not present

## 2023-03-28 DIAGNOSIS — M6281 Muscle weakness (generalized): Secondary | ICD-10-CM | POA: Diagnosis not present

## 2023-03-28 DIAGNOSIS — I480 Paroxysmal atrial fibrillation: Secondary | ICD-10-CM | POA: Diagnosis not present

## 2023-03-28 DIAGNOSIS — S72491D Other fracture of lower end of right femur, subsequent encounter for closed fracture with routine healing: Secondary | ICD-10-CM | POA: Diagnosis not present

## 2023-03-28 DIAGNOSIS — I5032 Chronic diastolic (congestive) heart failure: Secondary | ICD-10-CM | POA: Diagnosis not present

## 2023-03-28 DIAGNOSIS — R2681 Unsteadiness on feet: Secondary | ICD-10-CM | POA: Diagnosis not present

## 2023-03-28 DIAGNOSIS — Z9181 History of falling: Secondary | ICD-10-CM | POA: Diagnosis not present

## 2023-03-28 DIAGNOSIS — R2689 Other abnormalities of gait and mobility: Secondary | ICD-10-CM | POA: Diagnosis not present

## 2023-03-31 DIAGNOSIS — Z9181 History of falling: Secondary | ICD-10-CM | POA: Diagnosis not present

## 2023-03-31 DIAGNOSIS — M6281 Muscle weakness (generalized): Secondary | ICD-10-CM | POA: Diagnosis not present

## 2023-03-31 DIAGNOSIS — R2681 Unsteadiness on feet: Secondary | ICD-10-CM | POA: Diagnosis not present

## 2023-03-31 DIAGNOSIS — R2689 Other abnormalities of gait and mobility: Secondary | ICD-10-CM | POA: Diagnosis not present

## 2023-03-31 DIAGNOSIS — I5032 Chronic diastolic (congestive) heart failure: Secondary | ICD-10-CM | POA: Diagnosis not present

## 2023-03-31 DIAGNOSIS — S72491D Other fracture of lower end of right femur, subsequent encounter for closed fracture with routine healing: Secondary | ICD-10-CM | POA: Diagnosis not present

## 2023-03-31 DIAGNOSIS — I480 Paroxysmal atrial fibrillation: Secondary | ICD-10-CM | POA: Diagnosis not present

## 2023-04-04 DIAGNOSIS — Z9181 History of falling: Secondary | ICD-10-CM | POA: Diagnosis not present

## 2023-04-04 DIAGNOSIS — S72491D Other fracture of lower end of right femur, subsequent encounter for closed fracture with routine healing: Secondary | ICD-10-CM | POA: Diagnosis not present

## 2023-04-04 DIAGNOSIS — R2689 Other abnormalities of gait and mobility: Secondary | ICD-10-CM | POA: Diagnosis not present

## 2023-04-04 DIAGNOSIS — R2681 Unsteadiness on feet: Secondary | ICD-10-CM | POA: Diagnosis not present

## 2023-04-04 DIAGNOSIS — I5032 Chronic diastolic (congestive) heart failure: Secondary | ICD-10-CM | POA: Diagnosis not present

## 2023-04-04 DIAGNOSIS — M6281 Muscle weakness (generalized): Secondary | ICD-10-CM | POA: Diagnosis not present

## 2023-04-04 DIAGNOSIS — I480 Paroxysmal atrial fibrillation: Secondary | ICD-10-CM | POA: Diagnosis not present

## 2023-04-07 DIAGNOSIS — R2689 Other abnormalities of gait and mobility: Secondary | ICD-10-CM | POA: Diagnosis not present

## 2023-04-07 DIAGNOSIS — R2681 Unsteadiness on feet: Secondary | ICD-10-CM | POA: Diagnosis not present

## 2023-04-07 DIAGNOSIS — I5032 Chronic diastolic (congestive) heart failure: Secondary | ICD-10-CM | POA: Diagnosis not present

## 2023-04-07 DIAGNOSIS — Z9181 History of falling: Secondary | ICD-10-CM | POA: Diagnosis not present

## 2023-04-07 DIAGNOSIS — S72491D Other fracture of lower end of right femur, subsequent encounter for closed fracture with routine healing: Secondary | ICD-10-CM | POA: Diagnosis not present

## 2023-04-07 DIAGNOSIS — M6281 Muscle weakness (generalized): Secondary | ICD-10-CM | POA: Diagnosis not present

## 2023-04-07 DIAGNOSIS — I480 Paroxysmal atrial fibrillation: Secondary | ICD-10-CM | POA: Diagnosis not present

## 2023-04-08 ENCOUNTER — Telehealth: Payer: Self-pay | Admitting: Cardiology

## 2023-04-08 DIAGNOSIS — S72491D Other fracture of lower end of right femur, subsequent encounter for closed fracture with routine healing: Secondary | ICD-10-CM | POA: Diagnosis not present

## 2023-04-08 DIAGNOSIS — I5032 Chronic diastolic (congestive) heart failure: Secondary | ICD-10-CM | POA: Diagnosis not present

## 2023-04-08 DIAGNOSIS — K219 Gastro-esophageal reflux disease without esophagitis: Secondary | ICD-10-CM | POA: Diagnosis not present

## 2023-04-08 DIAGNOSIS — I480 Paroxysmal atrial fibrillation: Secondary | ICD-10-CM | POA: Diagnosis not present

## 2023-04-08 NOTE — Telephone Encounter (Signed)
Called and spoke to pt who stated that she was just discharged from a rehab facility.   Prior to being in the rehab facility pt was taking warfarin 5mg  on Mondays and Fridays and 2.5mg  all the other days.   Pt stated when she was at the rehab facility her INR was up and down and her dose changed to warfarin 4mg  daily. Pt stated when she was discharged from the Rehab facility she was given enough warfarin to take 4mg  daily until Monday 10/21.   Instructed pt to continue to take warfarin 4mg  daily and then will recheck her INR on Tuesday 10/22. Appointment made.

## 2023-04-08 NOTE — Telephone Encounter (Signed)
Patient called and said she just got out of rehab and wanted to know how do they want her to take her Warfarin. Right now she is taking 4 MG

## 2023-04-08 NOTE — Telephone Encounter (Signed)
Please advise 

## 2023-04-10 DIAGNOSIS — F419 Anxiety disorder, unspecified: Secondary | ICD-10-CM | POA: Diagnosis not present

## 2023-04-10 DIAGNOSIS — I110 Hypertensive heart disease with heart failure: Secondary | ICD-10-CM | POA: Diagnosis not present

## 2023-04-10 DIAGNOSIS — D638 Anemia in other chronic diseases classified elsewhere: Secondary | ICD-10-CM | POA: Diagnosis not present

## 2023-04-10 DIAGNOSIS — K219 Gastro-esophageal reflux disease without esophagitis: Secondary | ICD-10-CM | POA: Diagnosis not present

## 2023-04-10 DIAGNOSIS — Z9181 History of falling: Secondary | ICD-10-CM | POA: Diagnosis not present

## 2023-04-10 DIAGNOSIS — E785 Hyperlipidemia, unspecified: Secondary | ICD-10-CM | POA: Diagnosis not present

## 2023-04-10 DIAGNOSIS — I5032 Chronic diastolic (congestive) heart failure: Secondary | ICD-10-CM | POA: Diagnosis not present

## 2023-04-10 DIAGNOSIS — Z96643 Presence of artificial hip joint, bilateral: Secondary | ICD-10-CM | POA: Diagnosis not present

## 2023-04-10 DIAGNOSIS — I480 Paroxysmal atrial fibrillation: Secondary | ICD-10-CM | POA: Diagnosis not present

## 2023-04-10 DIAGNOSIS — E559 Vitamin D deficiency, unspecified: Secondary | ICD-10-CM | POA: Diagnosis not present

## 2023-04-10 DIAGNOSIS — M9711XD Periprosthetic fracture around internal prosthetic right knee joint, subsequent encounter: Secondary | ICD-10-CM | POA: Diagnosis not present

## 2023-04-10 DIAGNOSIS — Z7901 Long term (current) use of anticoagulants: Secondary | ICD-10-CM | POA: Diagnosis not present

## 2023-04-10 DIAGNOSIS — T7840XD Allergy, unspecified, subsequent encounter: Secondary | ICD-10-CM | POA: Diagnosis not present

## 2023-04-10 DIAGNOSIS — Z96619 Presence of unspecified artificial shoulder joint: Secondary | ICD-10-CM | POA: Diagnosis not present

## 2023-04-10 DIAGNOSIS — S72491D Other fracture of lower end of right femur, subsequent encounter for closed fracture with routine healing: Secondary | ICD-10-CM | POA: Diagnosis not present

## 2023-04-10 DIAGNOSIS — G43909 Migraine, unspecified, not intractable, without status migrainosus: Secondary | ICD-10-CM | POA: Diagnosis not present

## 2023-04-10 DIAGNOSIS — F32A Depression, unspecified: Secondary | ICD-10-CM | POA: Diagnosis not present

## 2023-04-12 ENCOUNTER — Ambulatory Visit: Payer: Medicare Other | Attending: Interventional Cardiology

## 2023-04-12 DIAGNOSIS — M9711XD Periprosthetic fracture around internal prosthetic right knee joint, subsequent encounter: Secondary | ICD-10-CM | POA: Diagnosis not present

## 2023-04-12 DIAGNOSIS — Z5181 Encounter for therapeutic drug level monitoring: Secondary | ICD-10-CM | POA: Diagnosis not present

## 2023-04-12 DIAGNOSIS — S72491D Other fracture of lower end of right femur, subsequent encounter for closed fracture with routine healing: Secondary | ICD-10-CM | POA: Diagnosis not present

## 2023-04-12 DIAGNOSIS — I480 Paroxysmal atrial fibrillation: Secondary | ICD-10-CM | POA: Diagnosis not present

## 2023-04-12 DIAGNOSIS — D638 Anemia in other chronic diseases classified elsewhere: Secondary | ICD-10-CM | POA: Diagnosis not present

## 2023-04-12 DIAGNOSIS — I110 Hypertensive heart disease with heart failure: Secondary | ICD-10-CM | POA: Diagnosis not present

## 2023-04-12 DIAGNOSIS — I5032 Chronic diastolic (congestive) heart failure: Secondary | ICD-10-CM | POA: Diagnosis not present

## 2023-04-12 LAB — POCT INR: INR: 4 — AB (ref 2.0–3.0)

## 2023-04-12 NOTE — Patient Instructions (Signed)
HOLD TODAY and WEDNESDAY THEN DECREASE TO 0.5 TABLET DAILY.  START EATING GREENS and TAKING YOUR MULTIVITAMIN. INR in 1 week.  Call with any new medications, any new procedures, and with any concerns  [phone] or [phone] or [phone] Procedure Fax [phone] or [phone]

## 2023-04-14 DIAGNOSIS — S72491D Other fracture of lower end of right femur, subsequent encounter for closed fracture with routine healing: Secondary | ICD-10-CM | POA: Diagnosis not present

## 2023-04-14 DIAGNOSIS — I110 Hypertensive heart disease with heart failure: Secondary | ICD-10-CM | POA: Diagnosis not present

## 2023-04-14 DIAGNOSIS — I480 Paroxysmal atrial fibrillation: Secondary | ICD-10-CM | POA: Diagnosis not present

## 2023-04-14 DIAGNOSIS — D638 Anemia in other chronic diseases classified elsewhere: Secondary | ICD-10-CM | POA: Diagnosis not present

## 2023-04-14 DIAGNOSIS — I5032 Chronic diastolic (congestive) heart failure: Secondary | ICD-10-CM | POA: Diagnosis not present

## 2023-04-14 DIAGNOSIS — M9711XD Periprosthetic fracture around internal prosthetic right knee joint, subsequent encounter: Secondary | ICD-10-CM | POA: Diagnosis not present

## 2023-04-18 DIAGNOSIS — D638 Anemia in other chronic diseases classified elsewhere: Secondary | ICD-10-CM | POA: Diagnosis not present

## 2023-04-18 DIAGNOSIS — I110 Hypertensive heart disease with heart failure: Secondary | ICD-10-CM | POA: Diagnosis not present

## 2023-04-18 DIAGNOSIS — I5032 Chronic diastolic (congestive) heart failure: Secondary | ICD-10-CM | POA: Diagnosis not present

## 2023-04-18 DIAGNOSIS — M9711XD Periprosthetic fracture around internal prosthetic right knee joint, subsequent encounter: Secondary | ICD-10-CM | POA: Diagnosis not present

## 2023-04-18 DIAGNOSIS — I480 Paroxysmal atrial fibrillation: Secondary | ICD-10-CM | POA: Diagnosis not present

## 2023-04-18 DIAGNOSIS — S72491D Other fracture of lower end of right femur, subsequent encounter for closed fracture with routine healing: Secondary | ICD-10-CM | POA: Diagnosis not present

## 2023-04-19 DIAGNOSIS — S72301D Unspecified fracture of shaft of right femur, subsequent encounter for closed fracture with routine healing: Secondary | ICD-10-CM | POA: Diagnosis not present

## 2023-04-21 ENCOUNTER — Other Ambulatory Visit: Payer: Self-pay | Admitting: *Deleted

## 2023-04-21 DIAGNOSIS — I5032 Chronic diastolic (congestive) heart failure: Secondary | ICD-10-CM | POA: Diagnosis not present

## 2023-04-21 DIAGNOSIS — D638 Anemia in other chronic diseases classified elsewhere: Secondary | ICD-10-CM | POA: Diagnosis not present

## 2023-04-21 DIAGNOSIS — M9711XD Periprosthetic fracture around internal prosthetic right knee joint, subsequent encounter: Secondary | ICD-10-CM | POA: Diagnosis not present

## 2023-04-21 DIAGNOSIS — I110 Hypertensive heart disease with heart failure: Secondary | ICD-10-CM | POA: Diagnosis not present

## 2023-04-21 DIAGNOSIS — I480 Paroxysmal atrial fibrillation: Secondary | ICD-10-CM | POA: Diagnosis not present

## 2023-04-21 DIAGNOSIS — S72491D Other fracture of lower end of right femur, subsequent encounter for closed fracture with routine healing: Secondary | ICD-10-CM | POA: Diagnosis not present

## 2023-04-21 NOTE — Patient Outreach (Signed)
Post- Acute Care Coordinator follow up.   Verified with [name] social worker. [name] [name] discharged from [facility] on 04/08/23 with [facility] home health. Returned home with spouse.   No identifiable chronic care management services.  Sierra Noble, MSN, RN, BSN [place]  [facility], [facility] RN Post- Acute Care Coordinator Direct Dial: [phone]

## 2023-04-22 ENCOUNTER — Ambulatory Visit: Payer: Medicare Other | Attending: Interventional Cardiology

## 2023-04-22 DIAGNOSIS — I480 Paroxysmal atrial fibrillation: Secondary | ICD-10-CM | POA: Insufficient documentation

## 2023-04-22 DIAGNOSIS — Z5181 Encounter for therapeutic drug level monitoring: Secondary | ICD-10-CM | POA: Diagnosis not present

## 2023-04-22 LAB — POCT INR: INR: 1.6 — AB (ref 2.0–3.0)

## 2023-04-22 NOTE — Patient Instructions (Signed)
TAKE 1 TABLET TONIGHT ONLY THEN CONTINUE 0.5 TABLET DAILY.  INR in 2 weeks.  Call with any new medications, any new procedures, and with any concerns  [phone] or [phone] or [phone] Procedure Fax [phone] or [phone]

## 2023-04-25 ENCOUNTER — Ambulatory Visit: Payer: Medicare Other | Admitting: Interventional Cardiology

## 2023-04-25 DIAGNOSIS — I5032 Chronic diastolic (congestive) heart failure: Secondary | ICD-10-CM | POA: Diagnosis not present

## 2023-04-25 DIAGNOSIS — D638 Anemia in other chronic diseases classified elsewhere: Secondary | ICD-10-CM | POA: Diagnosis not present

## 2023-04-25 DIAGNOSIS — I110 Hypertensive heart disease with heart failure: Secondary | ICD-10-CM | POA: Diagnosis not present

## 2023-04-25 DIAGNOSIS — S72491D Other fracture of lower end of right femur, subsequent encounter for closed fracture with routine healing: Secondary | ICD-10-CM | POA: Diagnosis not present

## 2023-04-25 DIAGNOSIS — I480 Paroxysmal atrial fibrillation: Secondary | ICD-10-CM | POA: Diagnosis not present

## 2023-04-25 DIAGNOSIS — M9711XD Periprosthetic fracture around internal prosthetic right knee joint, subsequent encounter: Secondary | ICD-10-CM | POA: Diagnosis not present

## 2023-04-27 DIAGNOSIS — I480 Paroxysmal atrial fibrillation: Secondary | ICD-10-CM | POA: Diagnosis not present

## 2023-04-27 DIAGNOSIS — D638 Anemia in other chronic diseases classified elsewhere: Secondary | ICD-10-CM | POA: Diagnosis not present

## 2023-04-27 DIAGNOSIS — M9711XD Periprosthetic fracture around internal prosthetic right knee joint, subsequent encounter: Secondary | ICD-10-CM | POA: Diagnosis not present

## 2023-04-27 DIAGNOSIS — I5032 Chronic diastolic (congestive) heart failure: Secondary | ICD-10-CM | POA: Diagnosis not present

## 2023-04-27 DIAGNOSIS — S72491D Other fracture of lower end of right femur, subsequent encounter for closed fracture with routine healing: Secondary | ICD-10-CM | POA: Diagnosis not present

## 2023-04-27 DIAGNOSIS — I110 Hypertensive heart disease with heart failure: Secondary | ICD-10-CM | POA: Diagnosis not present

## 2023-05-03 DIAGNOSIS — I5032 Chronic diastolic (congestive) heart failure: Secondary | ICD-10-CM | POA: Diagnosis not present

## 2023-05-03 DIAGNOSIS — I110 Hypertensive heart disease with heart failure: Secondary | ICD-10-CM | POA: Diagnosis not present

## 2023-05-03 DIAGNOSIS — M9711XD Periprosthetic fracture around internal prosthetic right knee joint, subsequent encounter: Secondary | ICD-10-CM | POA: Diagnosis not present

## 2023-05-03 DIAGNOSIS — I480 Paroxysmal atrial fibrillation: Secondary | ICD-10-CM | POA: Diagnosis not present

## 2023-05-03 DIAGNOSIS — S72491D Other fracture of lower end of right femur, subsequent encounter for closed fracture with routine healing: Secondary | ICD-10-CM | POA: Diagnosis not present

## 2023-05-03 DIAGNOSIS — D638 Anemia in other chronic diseases classified elsewhere: Secondary | ICD-10-CM | POA: Diagnosis not present

## 2023-05-04 DIAGNOSIS — I480 Paroxysmal atrial fibrillation: Secondary | ICD-10-CM | POA: Diagnosis not present

## 2023-05-04 DIAGNOSIS — I110 Hypertensive heart disease with heart failure: Secondary | ICD-10-CM | POA: Diagnosis not present

## 2023-05-04 DIAGNOSIS — D638 Anemia in other chronic diseases classified elsewhere: Secondary | ICD-10-CM | POA: Diagnosis not present

## 2023-05-04 DIAGNOSIS — S72491D Other fracture of lower end of right femur, subsequent encounter for closed fracture with routine healing: Secondary | ICD-10-CM | POA: Diagnosis not present

## 2023-05-04 DIAGNOSIS — M9711XD Periprosthetic fracture around internal prosthetic right knee joint, subsequent encounter: Secondary | ICD-10-CM | POA: Diagnosis not present

## 2023-05-04 DIAGNOSIS — I5032 Chronic diastolic (congestive) heart failure: Secondary | ICD-10-CM | POA: Diagnosis not present

## 2023-05-06 ENCOUNTER — Ambulatory Visit: Payer: Medicare Other | Attending: Internal Medicine

## 2023-05-06 DIAGNOSIS — Z5181 Encounter for therapeutic drug level monitoring: Secondary | ICD-10-CM | POA: Diagnosis not present

## 2023-05-06 DIAGNOSIS — I480 Paroxysmal atrial fibrillation: Secondary | ICD-10-CM | POA: Insufficient documentation

## 2023-05-06 LAB — POCT INR: INR: 1.6 — AB (ref 2.0–3.0)

## 2023-05-06 NOTE — Patient Instructions (Signed)
Description   TAKE 1 TABLET TONIGHT AND THEN START taking 0.5 tablet daily except 1 tablet on Mondays.  Recheck INR in 2 weeks.   Call with any new medications, any new procedures, and with any concerns  [phone] Procedure Fax [phone] or [phone]

## 2023-05-10 DIAGNOSIS — E785 Hyperlipidemia, unspecified: Secondary | ICD-10-CM | POA: Diagnosis not present

## 2023-05-10 DIAGNOSIS — D638 Anemia in other chronic diseases classified elsewhere: Secondary | ICD-10-CM | POA: Diagnosis not present

## 2023-05-10 DIAGNOSIS — I480 Paroxysmal atrial fibrillation: Secondary | ICD-10-CM | POA: Diagnosis not present

## 2023-05-10 DIAGNOSIS — I5032 Chronic diastolic (congestive) heart failure: Secondary | ICD-10-CM | POA: Diagnosis not present

## 2023-05-10 DIAGNOSIS — M9711XD Periprosthetic fracture around internal prosthetic right knee joint, subsequent encounter: Secondary | ICD-10-CM | POA: Diagnosis not present

## 2023-05-10 DIAGNOSIS — E559 Vitamin D deficiency, unspecified: Secondary | ICD-10-CM | POA: Diagnosis not present

## 2023-05-10 DIAGNOSIS — I110 Hypertensive heart disease with heart failure: Secondary | ICD-10-CM | POA: Diagnosis not present

## 2023-05-10 DIAGNOSIS — Z96643 Presence of artificial hip joint, bilateral: Secondary | ICD-10-CM | POA: Diagnosis not present

## 2023-05-10 DIAGNOSIS — F419 Anxiety disorder, unspecified: Secondary | ICD-10-CM | POA: Diagnosis not present

## 2023-05-10 DIAGNOSIS — S72491D Other fracture of lower end of right femur, subsequent encounter for closed fracture with routine healing: Secondary | ICD-10-CM | POA: Diagnosis not present

## 2023-05-10 DIAGNOSIS — F32A Depression, unspecified: Secondary | ICD-10-CM | POA: Diagnosis not present

## 2023-05-10 DIAGNOSIS — Z7901 Long term (current) use of anticoagulants: Secondary | ICD-10-CM | POA: Diagnosis not present

## 2023-05-10 DIAGNOSIS — Z9181 History of falling: Secondary | ICD-10-CM | POA: Diagnosis not present

## 2023-05-10 DIAGNOSIS — Z96619 Presence of unspecified artificial shoulder joint: Secondary | ICD-10-CM | POA: Diagnosis not present

## 2023-05-10 DIAGNOSIS — G43909 Migraine, unspecified, not intractable, without status migrainosus: Secondary | ICD-10-CM | POA: Diagnosis not present

## 2023-05-10 DIAGNOSIS — T7840XD Allergy, unspecified, subsequent encounter: Secondary | ICD-10-CM | POA: Diagnosis not present

## 2023-05-10 DIAGNOSIS — K219 Gastro-esophageal reflux disease without esophagitis: Secondary | ICD-10-CM | POA: Diagnosis not present

## 2023-05-11 DIAGNOSIS — I480 Paroxysmal atrial fibrillation: Secondary | ICD-10-CM | POA: Diagnosis not present

## 2023-05-11 DIAGNOSIS — D638 Anemia in other chronic diseases classified elsewhere: Secondary | ICD-10-CM | POA: Diagnosis not present

## 2023-05-11 DIAGNOSIS — I110 Hypertensive heart disease with heart failure: Secondary | ICD-10-CM | POA: Diagnosis not present

## 2023-05-11 DIAGNOSIS — S72491D Other fracture of lower end of right femur, subsequent encounter for closed fracture with routine healing: Secondary | ICD-10-CM | POA: Diagnosis not present

## 2023-05-11 DIAGNOSIS — M9711XD Periprosthetic fracture around internal prosthetic right knee joint, subsequent encounter: Secondary | ICD-10-CM | POA: Diagnosis not present

## 2023-05-11 DIAGNOSIS — I5032 Chronic diastolic (congestive) heart failure: Secondary | ICD-10-CM | POA: Diagnosis not present

## 2023-05-17 ENCOUNTER — Encounter (HOSPITAL_BASED_OUTPATIENT_CLINIC_OR_DEPARTMENT_OTHER): Payer: Self-pay | Admitting: Family

## 2023-05-17 ENCOUNTER — Ambulatory Visit (INDEPENDENT_AMBULATORY_CARE_PROVIDER_SITE_OTHER): Payer: Medicare Other | Admitting: Family

## 2023-05-17 VITALS — BP 126/60 | HR 95 | Ht 65.0 in | Wt 189.8 lb

## 2023-05-17 DIAGNOSIS — S72491D Other fracture of lower end of right femur, subsequent encounter for closed fracture with routine healing: Secondary | ICD-10-CM | POA: Diagnosis not present

## 2023-05-17 DIAGNOSIS — I5032 Chronic diastolic (congestive) heart failure: Secondary | ICD-10-CM

## 2023-05-17 DIAGNOSIS — I4821 Permanent atrial fibrillation: Secondary | ICD-10-CM | POA: Diagnosis not present

## 2023-05-17 DIAGNOSIS — D6859 Other primary thrombophilia: Secondary | ICD-10-CM

## 2023-05-17 DIAGNOSIS — I10 Essential (primary) hypertension: Secondary | ICD-10-CM | POA: Diagnosis not present

## 2023-05-17 DIAGNOSIS — M9711XD Periprosthetic fracture around internal prosthetic right knee joint, subsequent encounter: Secondary | ICD-10-CM | POA: Diagnosis not present

## 2023-05-17 DIAGNOSIS — I480 Paroxysmal atrial fibrillation: Secondary | ICD-10-CM | POA: Diagnosis not present

## 2023-05-17 DIAGNOSIS — I110 Hypertensive heart disease with heart failure: Secondary | ICD-10-CM | POA: Diagnosis not present

## 2023-05-17 DIAGNOSIS — D638 Anemia in other chronic diseases classified elsewhere: Secondary | ICD-10-CM | POA: Diagnosis not present

## 2023-05-17 MED ORDER — METOPROLOL SUCCINATE ER 100 MG PO TB24
100.0000 mg | ORAL_TABLET | Freq: Two times a day (BID) | ORAL | 3 refills | Status: DC
Start: 2023-05-17 — End: 2023-08-25

## 2023-05-17 NOTE — Progress Notes (Unsigned)
Cardiology Office Note:  .   Date:  05/17/2023  ID:  Sierra [name], DOB Jan 13, 1946, MRN [id number] PCP: Emilio Aspen, MD  [facility] Providers Cardiologist:  Lance Muss, MD { Click to update primary MD,subspecialty MD or APP then REFRESH:1}   History of Present Illness: Sierra [name] is a 77 y.o. female with history of permanent atrial fibrillation, HTN, GERD, chronic diastolic heart failure.   Atrial fibrillation diagnosed 05/2015 in setting of severe GI bug. Underwent successful cardioversion 07/15/17. Had recurrent afib which resolved after increasing Toprol to 150mg  daily. Afib returned early 2023 and has since been rate controlled.   Admitted 9/7-9/12/24 with femur fracture after mechanical fall. Underwent ORIF 02/28/23. Discharged to SNF.   Has been home from SNF about a month. Ambulating in her home with a Jadriel Saxer. Notes right leg at site of femur fracture will "ache" with ambulation. She completed her last in home physical therapy this week. Reports some mild dyspnea which she attributes to longstanding stuffy nose. No significant exertional dyspnea. Denies bleeding complications on Warfarin. Has had labile INR since her surgery. No significant LE edema, controlled on present dose Torsemide 20mg  daily.   ROS: Please see the history of present illness.    All other systems reviewed and are negative.   Studies Reviewed: .        Cardiac Studies & Procedures       ECHOCARDIOGRAM  ECHOCARDIOGRAM COMPLETE 04/09/2017  Narrative *[place]* *[facility]* [address] [place], [place] [address] [phone]  ------------------------------------------------------------------- Transthoracic Echocardiography  Patient:    [name], Sierra MR #:       [id number] Study Date: 04/09/2017 Gender:     F Age:        70 Height:     170.2 cm Weight:     93.1 kg BSA:        2.13 m^2 Pt. Status: Room:       [address], [place] [id number] SONOGRAPHER  Arvil Chaco Lucilla Lame REFERRING    Tyrone Nine PERFORMING   [facility], Inpatient  cc:  ------------------------------------------------------------------- LV EF: 55% -   60%  ------------------------------------------------------------------- Indications:      Atrial fibrillation - 427.31.  ------------------------------------------------------------------- History:   PMH:  Acute respiratory insufficiency. Arthritis. UTI. Hypoxia. Bibasilar crackles. Sepsis. Essential hypertension. Depression. Hyperlipidemia. Bacteremia.  ------------------------------------------------------------------- Study Conclusions  - Left ventricle: The cavity size was normal. There was mild concentric hypertrophy. Systolic function was normal. The estimated ejection fraction was in the range of 55% to 60%. Wall motion was normal; there were no regional wall motion abnormalities. - Aortic valve: Transvalvular velocity was within the normal range. There was no stenosis. There was no regurgitation. - Mitral valve: Transvalvular velocity was within the normal range. There was no evidence for stenosis. There was mild regurgitation. - Left atrium: The atrium was severely dilated. - Right ventricle: The cavity size was normal. Wall thickness was normal. Systolic function was normal. - Right atrium: The atrium was moderately dilated. - Tricuspid valve: There was mild regurgitation. - Pulmonary arteries: Systolic pressure was mildly increased. PA peak pressure: 48 mm Hg (S).  ------------------------------------------------------------------- Study data:  Comparison was made to the study of 08/08/2015.  Study status:  Routine.  Procedure:  The patient reported no pain pre or post test. Transthoracic echocardiography. Image quality was adequate.  Study  completion:  There were no complications. Transthoracic echocardiography.  M-mode, complete  2D, spectral Doppler, and color Doppler.  Birthdate:  Patient birthdate: 14-Apr-1946.  Age:  Patient is 77 yr old.  Sex:  Gender: female. BMI: 32.1 kg/m^2.  Blood pressure:     137/60  Patient status: Inpatient.  Study date:  Study date: 04/09/2017. Study time: 09:32 AM.  Location:  Bedside.  -------------------------------------------------------------------  ------------------------------------------------------------------- Left ventricle:  The cavity size was normal. There was mild concentric hypertrophy. Systolic function was normal. The estimated ejection fraction was in the range of 55% to 60%. Wall motion was normal; there were no regional wall motion abnormalities. The study was not technically sufficient to allow evaluation of LV diastolic dysfunction due to atrial fibrillation.  ------------------------------------------------------------------- Aortic valve:  Poorly visualized.  Trileaflet; normal thickness leaflets. Mobility was not restricted.  Doppler:  Transvalvular velocity was within the normal range. There was no stenosis. There was no regurgitation.  ------------------------------------------------------------------- Aorta:  Aortic root: The aortic root was normal in size.  ------------------------------------------------------------------- Mitral valve:   Structurally normal valve.   Mobility was not restricted.  Doppler:  Transvalvular velocity was within the normal range. There was no evidence for stenosis. There was mild regurgitation.  ------------------------------------------------------------------- Left atrium:  The atrium was severely dilated.  ------------------------------------------------------------------- Right ventricle:  The cavity size was normal. Wall thickness was normal. Systolic function was normal.  ------------------------------------------------------------------- Pulmonic valve:    Structurally normal valve.   Cusp separation  was normal.  Doppler:  Transvalvular velocity was within the normal range. There was no evidence for stenosis. There was no regurgitation.  ------------------------------------------------------------------- Tricuspid valve:   Structurally normal valve.    Doppler: Transvalvular velocity was within the normal range. There was mild regurgitation.  ------------------------------------------------------------------- Pulmonary artery:   The main pulmonary artery was normal-sized. Systolic pressure was mildly increased.  ------------------------------------------------------------------- Right atrium:  The atrium was moderately dilated.  ------------------------------------------------------------------- Pericardium:  There was no pericardial effusion.  ------------------------------------------------------------------- Systemic veins: Inferior vena cava: The vessel was dilated. The respirophasic diameter changes were blunted (< 50%), consistent with elevated central venous pressure.  ------------------------------------------------------------------- Measurements  Left ventricle                          Value        Reference LV ID, ED, PLAX chordal         (L)     42.1  mm     43 - 52 LV ID, ES, PLAX chordal                 30.2  mm     23 - 38 LV fx shortening, PLAX chordal  (L)     28    %      >=29 LV PW thickness, ED                     12.8  mm     ---------- IVS/LV PW ratio, ED                     1.05         <=1.3 Stroke volume, 2D                       60    ml     ---------- Stroke volume/bsa, 2D  28    ml/m^2 ----------  Ventricular septum                      Value        Reference IVS thickness, ED                       13.5  mm     ----------  LVOT                                    Value        Reference LVOT ID, S                              20    mm     ---------- LVOT area                               3.14  cm^2   ---------- LVOT mean  velocity, S                   64.7  cm/s   ---------- LVOT VTI, S                             19.2  cm     ----------  Aorta                                   Value        Reference Aortic root ID, ED                      32    mm     ----------  Left atrium                             Value        Reference LA ID, A-P, ES                          52    mm     ---------- LA ID/bsa, A-P                  (H)     2.44  cm/m^2 <=2.2 LA volume, S                            114   ml     ---------- LA volume/bsa, S                        53.5  ml/m^2 ---------- LA volume, ES, 1-p A4C                  112   ml     ---------- LA volume/bsa, ES, 1-p A4C              52.6  ml/m^2 ---------- LA volume, ES, 1-p A2C                  114  ml     ---------- LA volume/bsa, ES, 1-p A2C              53.5  ml/m^2 ----------  Mitral valve                            Value        Reference Mitral regurg VTI, PISA                 153   cm     ---------- Mitral ERO, PISA                        0.18  cm^2   ---------- Mitral regurg volume, PISA              28    ml     ----------  Pulmonary arteries                      Value        Reference PA pressure, S, DP              (H)     48    mm Hg  <=30  Tricuspid valve                         Value        Reference Tricuspid regurg peak velocity          287   cm/s   ---------- Tricuspid peak RV-RA gradient           33    mm Hg  ----------  Right atrium                            Value        Reference RA ID, S-I, ES, A4C             (H)     54.6  mm     34 - 49 RA area, ES, A4C                (H)     24.4  cm^2   8.3 - 19.5 RA volume, ES, A/L                      79.7  ml     ---------- RA volume/bsa, ES, A/L                  37.4  ml/m^2 ----------  Systemic veins                          Value        Reference Estimated CVP                           15    mm Hg  ----------  Right ventricle                         Value        Reference RV ID, minor  axis, ED, A4C base         36.8  mm     ---------- TAPSE  18    mm     ---------- RV pressure, S, DP              (H)     48    mm Hg  <=30 RV s&', lateral, S                       10.8  cm/s   ----------  Legend: (L)  and  (H)  mark values outside specified reference range.  ------------------------------------------------------------------- Prepared and Electronically Authenticated by  Chilton Si, MD 2018-10-20T14:39:24             Risk Assessment/Calculations:    CHA2DS2-VASc Score = 5  {Confirm score is correct.  If not, click here to update score.  REFRESH note.  :1} This indicates a 7.2% annual risk of stroke. The patient's score is based upon: CHF History: 1 HTN History: 1 Diabetes History: 0 Stroke History: 0 Vascular Disease History: 0 Age Score: 2 Gender Score: 1   {This patient has a significant risk of stroke if diagnosed with atrial fibrillation.  Please consider VKA or DOAC agent for anticoagulation if the bleeding risk is acceptable.   You can also use the SmartPhrase .[email or web address] for documentation.   :[id number]}         Physical Exam:   VS:  BP 126/60 (BP Location: Left Arm, Patient Position: Sitting, Cuff Size: Normal)   Pulse 95   Ht 5\' 5"  (1.651 m)   Wt 189 lb 12.8 oz (86.1 kg)   SpO2 97%   BMI 31.58 kg/m    Wt Readings from Last 3 Encounters:  05/17/23 189 lb 12.8 oz (86.1 kg)  02/26/23 180 lb (81.6 kg)  09/21/22 178 lb 9.2 oz (81 kg)    GEN: Well nourished, overweight, well developed in no acute distress NECK: No JVD; No carotid bruits CARDIAC: IRIR, no murmurs, rubs, gallops RESPIRATORY:  Clear to auscultation without rales, wheezing or rhonchi  ABDOMEN: Soft, non-tender, non-distended EXTREMITIES:  No edema; No deformity   ASSESSMENT AND PLAN: .    Permanent atrial fibrillation / Hypercoagulable state - Rate controlled today by auscultation. Reports no palpitations. Follows routinely with Coumadin  clinic. Reports no bleeding complications.   PHysical deconditioning - *** Discussed possible ***.   HTN - BP well controlled. Continue current antihypertensive regimen Torsemide 20mg  daily, Valsartan 320mg  daily, ***  Chronic diastolic heart failure - Euvolemic and well compensated on exam. ***Continue Toprol 100mg  BID, Torsemide 20mg  daily, Valsartan 320mg  daily.        Dispo: follow up as scheduled with Dr. Cristal Deer to establish care  Signed, Alver Sorrow, NP

## 2023-05-17 NOTE — Patient Instructions (Signed)
Medication Instructions:  Your physician recommends that you continue on your current medications as directed. Please refer to the Current Medication list given to you today.  *If you need a refill on your cardiac medications before your next appointment, please call your pharmacy*  Follow-Up: At [facility], you and your health needs are our priority.  As part of our continuing mission to provide you with exceptional heart care, we have created designated Provider Care Teams.  These Care Teams include your primary Cardiologist (physician) and Advanced Practice Providers (APPs -  Physician Assistants and Nurse Practitioners) who all work together to provide you with the care you need, when you need it.  We recommend signing up for the patient portal called "MyChart".  Sign up information is provided on this After Visit Summary.  MyChart is used to connect with patients for Virtual Visits (Telemedicine).  Patients are able to view lab/test results, encounter notes, upcoming appointments, etc.  Non-urgent messages can be sent to your provider as well.   To learn more about what you can do with MyChart, go to [email or web address].    Your next appointment:   Follow up as scheduled

## 2023-05-23 DIAGNOSIS — S72491D Other fracture of lower end of right femur, subsequent encounter for closed fracture with routine healing: Secondary | ICD-10-CM | POA: Diagnosis not present

## 2023-05-23 DIAGNOSIS — I110 Hypertensive heart disease with heart failure: Secondary | ICD-10-CM | POA: Diagnosis not present

## 2023-05-23 DIAGNOSIS — I480 Paroxysmal atrial fibrillation: Secondary | ICD-10-CM | POA: Diagnosis not present

## 2023-05-23 DIAGNOSIS — I5032 Chronic diastolic (congestive) heart failure: Secondary | ICD-10-CM | POA: Diagnosis not present

## 2023-05-23 DIAGNOSIS — D638 Anemia in other chronic diseases classified elsewhere: Secondary | ICD-10-CM | POA: Diagnosis not present

## 2023-05-23 DIAGNOSIS — M9711XD Periprosthetic fracture around internal prosthetic right knee joint, subsequent encounter: Secondary | ICD-10-CM | POA: Diagnosis not present

## 2023-05-24 ENCOUNTER — Ambulatory Visit: Payer: Medicare Other | Attending: Cardiology | Admitting: *Deleted

## 2023-05-24 DIAGNOSIS — I480 Paroxysmal atrial fibrillation: Secondary | ICD-10-CM

## 2023-05-24 DIAGNOSIS — Z5181 Encounter for therapeutic drug level monitoring: Secondary | ICD-10-CM | POA: Diagnosis not present

## 2023-05-24 LAB — POCT INR: INR: 2.5 (ref 2.0–3.0)

## 2023-05-24 NOTE — Patient Instructions (Addendum)
Description   Continue taking warfarin 1/2 tablet daily except 1 tablet on Mondays.  Recheck INR in 4 weeks.   Call with any new medications, any new procedures, and with any concerns [phone] Procedure Fax [phone] or [phone]

## 2023-05-26 DIAGNOSIS — I5032 Chronic diastolic (congestive) heart failure: Secondary | ICD-10-CM | POA: Diagnosis not present

## 2023-05-26 DIAGNOSIS — I480 Paroxysmal atrial fibrillation: Secondary | ICD-10-CM | POA: Diagnosis not present

## 2023-05-26 DIAGNOSIS — K219 Gastro-esophageal reflux disease without esophagitis: Secondary | ICD-10-CM | POA: Diagnosis not present

## 2023-05-26 DIAGNOSIS — S72491D Other fracture of lower end of right femur, subsequent encounter for closed fracture with routine healing: Secondary | ICD-10-CM | POA: Diagnosis not present

## 2023-05-26 DIAGNOSIS — F32A Depression, unspecified: Secondary | ICD-10-CM | POA: Diagnosis not present

## 2023-05-26 DIAGNOSIS — D638 Anemia in other chronic diseases classified elsewhere: Secondary | ICD-10-CM | POA: Diagnosis not present

## 2023-05-26 DIAGNOSIS — E785 Hyperlipidemia, unspecified: Secondary | ICD-10-CM | POA: Diagnosis not present

## 2023-05-26 DIAGNOSIS — R748 Abnormal levels of other serum enzymes: Secondary | ICD-10-CM | POA: Diagnosis not present

## 2023-05-26 DIAGNOSIS — D649 Anemia, unspecified: Secondary | ICD-10-CM | POA: Diagnosis not present

## 2023-05-26 DIAGNOSIS — M810 Age-related osteoporosis without current pathological fracture: Secondary | ICD-10-CM | POA: Diagnosis not present

## 2023-05-26 DIAGNOSIS — M9711XD Periprosthetic fracture around internal prosthetic right knee joint, subsequent encounter: Secondary | ICD-10-CM | POA: Diagnosis not present

## 2023-05-26 DIAGNOSIS — I110 Hypertensive heart disease with heart failure: Secondary | ICD-10-CM | POA: Diagnosis not present

## 2023-05-26 DIAGNOSIS — Z23 Encounter for immunization: Secondary | ICD-10-CM | POA: Diagnosis not present

## 2023-05-27 ENCOUNTER — Other Ambulatory Visit: Payer: Self-pay | Admitting: Internal Medicine

## 2023-05-27 DIAGNOSIS — M9711XD Periprosthetic fracture around internal prosthetic right knee joint, subsequent encounter: Secondary | ICD-10-CM | POA: Diagnosis not present

## 2023-05-27 DIAGNOSIS — I110 Hypertensive heart disease with heart failure: Secondary | ICD-10-CM | POA: Diagnosis not present

## 2023-05-27 DIAGNOSIS — I5032 Chronic diastolic (congestive) heart failure: Secondary | ICD-10-CM | POA: Diagnosis not present

## 2023-05-27 DIAGNOSIS — D638 Anemia in other chronic diseases classified elsewhere: Secondary | ICD-10-CM | POA: Diagnosis not present

## 2023-05-27 DIAGNOSIS — S72491D Other fracture of lower end of right femur, subsequent encounter for closed fracture with routine healing: Secondary | ICD-10-CM | POA: Diagnosis not present

## 2023-05-27 DIAGNOSIS — I480 Paroxysmal atrial fibrillation: Secondary | ICD-10-CM | POA: Diagnosis not present

## 2023-05-27 DIAGNOSIS — Z13820 Encounter for screening for osteoporosis: Secondary | ICD-10-CM

## 2023-06-07 DIAGNOSIS — S72301D Unspecified fracture of shaft of right femur, subsequent encounter for closed fracture with routine healing: Secondary | ICD-10-CM | POA: Diagnosis not present

## 2023-06-08 DIAGNOSIS — S72491D Other fracture of lower end of right femur, subsequent encounter for closed fracture with routine healing: Secondary | ICD-10-CM | POA: Diagnosis not present

## 2023-06-08 DIAGNOSIS — M9711XD Periprosthetic fracture around internal prosthetic right knee joint, subsequent encounter: Secondary | ICD-10-CM | POA: Diagnosis not present

## 2023-06-08 DIAGNOSIS — D638 Anemia in other chronic diseases classified elsewhere: Secondary | ICD-10-CM | POA: Diagnosis not present

## 2023-06-08 DIAGNOSIS — I110 Hypertensive heart disease with heart failure: Secondary | ICD-10-CM | POA: Diagnosis not present

## 2023-06-08 DIAGNOSIS — I480 Paroxysmal atrial fibrillation: Secondary | ICD-10-CM | POA: Diagnosis not present

## 2023-06-08 DIAGNOSIS — I5032 Chronic diastolic (congestive) heart failure: Secondary | ICD-10-CM | POA: Diagnosis not present

## 2023-06-09 DIAGNOSIS — L602 Onychogryphosis: Secondary | ICD-10-CM | POA: Diagnosis not present

## 2023-06-09 DIAGNOSIS — I70203 Unspecified atherosclerosis of native arteries of extremities, bilateral legs: Secondary | ICD-10-CM | POA: Diagnosis not present

## 2023-06-21 ENCOUNTER — Ambulatory Visit: Payer: Medicare Other | Attending: Cardiovascular Disease | Admitting: *Deleted

## 2023-06-21 DIAGNOSIS — I480 Paroxysmal atrial fibrillation: Secondary | ICD-10-CM | POA: Diagnosis not present

## 2023-06-21 DIAGNOSIS — I4891 Unspecified atrial fibrillation: Secondary | ICD-10-CM | POA: Diagnosis not present

## 2023-06-21 DIAGNOSIS — Z5181 Encounter for therapeutic drug level monitoring: Secondary | ICD-10-CM | POA: Diagnosis not present

## 2023-06-21 LAB — POCT INR: INR: 2.5 (ref 2.0–3.0)

## 2023-06-21 NOTE — Patient Instructions (Signed)
Description   Continue taking warfarin 1/2 tablet daily except 1 tablet on Mondays.  Recheck INR in 5 weeks.   Call with any new medications, any new procedures, and with any concerns [phone] Procedure Fax [phone] or [phone]

## 2023-07-04 ENCOUNTER — Ambulatory Visit (HOSPITAL_BASED_OUTPATIENT_CLINIC_OR_DEPARTMENT_OTHER): Payer: Medicare Other | Admitting: Cardiology

## 2023-07-08 DIAGNOSIS — N958 Other specified menopausal and perimenopausal disorders: Secondary | ICD-10-CM | POA: Diagnosis not present

## 2023-07-08 DIAGNOSIS — E2839 Other primary ovarian failure: Secondary | ICD-10-CM | POA: Diagnosis not present

## 2023-07-26 ENCOUNTER — Ambulatory Visit: Payer: Medicare Other | Attending: Cardiology

## 2023-07-26 DIAGNOSIS — I480 Paroxysmal atrial fibrillation: Secondary | ICD-10-CM | POA: Insufficient documentation

## 2023-07-26 DIAGNOSIS — Z5181 Encounter for therapeutic drug level monitoring: Secondary | ICD-10-CM | POA: Insufficient documentation

## 2023-07-26 LAB — POCT INR: INR: 2.5 (ref 2.0–3.0)

## 2023-07-26 NOTE — Patient Instructions (Signed)
Continue taking warfarin 1/2 tablet daily except 1 tablet on Mondays.  Recheck INR in 6 weeks.   Call with any new medications, any new procedures, and with any concerns [phone] Procedure Fax [phone] or [phone]

## 2023-07-28 DIAGNOSIS — Z08 Encounter for follow-up examination after completed treatment for malignant neoplasm: Secondary | ICD-10-CM | POA: Diagnosis not present

## 2023-07-28 DIAGNOSIS — L814 Other melanin hyperpigmentation: Secondary | ICD-10-CM | POA: Diagnosis not present

## 2023-07-28 DIAGNOSIS — D225 Melanocytic nevi of trunk: Secondary | ICD-10-CM | POA: Diagnosis not present

## 2023-07-28 DIAGNOSIS — Z85828 Personal history of other malignant neoplasm of skin: Secondary | ICD-10-CM | POA: Diagnosis not present

## 2023-07-28 DIAGNOSIS — L578 Other skin changes due to chronic exposure to nonionizing radiation: Secondary | ICD-10-CM | POA: Diagnosis not present

## 2023-07-28 DIAGNOSIS — Z872 Personal history of diseases of the skin and subcutaneous tissue: Secondary | ICD-10-CM | POA: Diagnosis not present

## 2023-07-28 DIAGNOSIS — L821 Other seborrheic keratosis: Secondary | ICD-10-CM | POA: Diagnosis not present

## 2023-07-28 DIAGNOSIS — L570 Actinic keratosis: Secondary | ICD-10-CM | POA: Diagnosis not present

## 2023-07-28 DIAGNOSIS — Z09 Encounter for follow-up examination after completed treatment for conditions other than malignant neoplasm: Secondary | ICD-10-CM | POA: Diagnosis not present

## 2023-08-01 ENCOUNTER — Telehealth: Payer: Self-pay

## 2023-08-01 NOTE — Telephone Encounter (Signed)
 Pt called and states she was prescribed FLUOROURACIL. I made pt aware this medication should not increase INR; however, it can increase risk for bleeding. Pt verbalized understanding and states she is unsure if she will take this medication at this time. I advised her to discuss the side effects/medication questions with prescribing provider.

## 2023-08-03 ENCOUNTER — Other Ambulatory Visit: Payer: Self-pay | Admitting: Family Medicine

## 2023-08-03 DIAGNOSIS — I480 Paroxysmal atrial fibrillation: Secondary | ICD-10-CM

## 2023-08-09 DIAGNOSIS — S72301D Unspecified fracture of shaft of right femur, subsequent encounter for closed fracture with routine healing: Secondary | ICD-10-CM | POA: Diagnosis not present

## 2023-08-15 DIAGNOSIS — L820 Inflamed seborrheic keratosis: Secondary | ICD-10-CM | POA: Diagnosis not present

## 2023-08-15 DIAGNOSIS — L570 Actinic keratosis: Secondary | ICD-10-CM | POA: Diagnosis not present

## 2023-08-15 DIAGNOSIS — L905 Scar conditions and fibrosis of skin: Secondary | ICD-10-CM | POA: Diagnosis not present

## 2023-08-15 DIAGNOSIS — D485 Neoplasm of uncertain behavior of skin: Secondary | ICD-10-CM | POA: Diagnosis not present

## 2023-08-19 DIAGNOSIS — Z1231 Encounter for screening mammogram for malignant neoplasm of breast: Secondary | ICD-10-CM | POA: Diagnosis not present

## 2023-08-23 ENCOUNTER — Other Ambulatory Visit: Payer: Self-pay | Admitting: Interventional Cardiology

## 2023-08-23 DIAGNOSIS — I4821 Permanent atrial fibrillation: Secondary | ICD-10-CM

## 2023-08-25 MED ORDER — METOPROLOL SUCCINATE ER 100 MG PO TB24: 100.0000 mg | ORAL_TABLET | Freq: Two times a day (BID) | ORAL | 2 refills | Status: DC

## 2023-08-31 DIAGNOSIS — I5032 Chronic diastolic (congestive) heart failure: Secondary | ICD-10-CM | POA: Diagnosis not present

## 2023-08-31 DIAGNOSIS — Z0000 Encounter for general adult medical examination without abnormal findings: Secondary | ICD-10-CM | POA: Diagnosis not present

## 2023-08-31 DIAGNOSIS — K219 Gastro-esophageal reflux disease without esophagitis: Secondary | ICD-10-CM | POA: Diagnosis not present

## 2023-08-31 DIAGNOSIS — Z79899 Other long term (current) drug therapy: Secondary | ICD-10-CM | POA: Diagnosis not present

## 2023-08-31 DIAGNOSIS — I110 Hypertensive heart disease with heart failure: Secondary | ICD-10-CM | POA: Diagnosis not present

## 2023-08-31 DIAGNOSIS — F32A Depression, unspecified: Secondary | ICD-10-CM | POA: Diagnosis not present

## 2023-08-31 DIAGNOSIS — I480 Paroxysmal atrial fibrillation: Secondary | ICD-10-CM | POA: Diagnosis not present

## 2023-08-31 DIAGNOSIS — E559 Vitamin D deficiency, unspecified: Secondary | ICD-10-CM | POA: Diagnosis not present

## 2023-08-31 DIAGNOSIS — N1831 Chronic kidney disease, stage 3a: Secondary | ICD-10-CM | POA: Diagnosis not present

## 2023-08-31 DIAGNOSIS — E785 Hyperlipidemia, unspecified: Secondary | ICD-10-CM | POA: Diagnosis not present

## 2023-08-31 DIAGNOSIS — M25561 Pain in right knee: Secondary | ICD-10-CM | POA: Diagnosis not present

## 2023-08-31 DIAGNOSIS — S72491D Other fracture of lower end of right femur, subsequent encounter for closed fracture with routine healing: Secondary | ICD-10-CM | POA: Diagnosis not present

## 2023-08-31 DIAGNOSIS — Z1331 Encounter for screening for depression: Secondary | ICD-10-CM | POA: Diagnosis not present

## 2023-08-31 DIAGNOSIS — T7840XD Allergy, unspecified, subsequent encounter: Secondary | ICD-10-CM | POA: Diagnosis not present

## 2023-09-06 ENCOUNTER — Ambulatory Visit: Payer: Medicare Other | Attending: Internal Medicine

## 2023-09-06 DIAGNOSIS — Z5181 Encounter for therapeutic drug level monitoring: Secondary | ICD-10-CM | POA: Diagnosis not present

## 2023-09-06 DIAGNOSIS — I480 Paroxysmal atrial fibrillation: Secondary | ICD-10-CM | POA: Diagnosis not present

## 2023-09-06 LAB — POCT INR: INR: 2.9 (ref 2.0–3.0)

## 2023-09-06 NOTE — Patient Instructions (Signed)
 Continue taking warfarin 1/2 tablet daily except 1 tablet on Mondays.  Recheck INR in 6 weeks.   Call with any new medications, any new procedures, and with any concerns [phone] Procedure Fax [phone] or [phone]

## 2023-09-07 DIAGNOSIS — L84 Corns and callosities: Secondary | ICD-10-CM | POA: Diagnosis not present

## 2023-09-07 DIAGNOSIS — I70203 Unspecified atherosclerosis of native arteries of extremities, bilateral legs: Secondary | ICD-10-CM | POA: Diagnosis not present

## 2023-09-07 DIAGNOSIS — L602 Onychogryphosis: Secondary | ICD-10-CM | POA: Diagnosis not present

## 2023-09-13 DIAGNOSIS — L820 Inflamed seborrheic keratosis: Secondary | ICD-10-CM | POA: Diagnosis not present

## 2023-09-13 DIAGNOSIS — Z789 Other specified health status: Secondary | ICD-10-CM | POA: Diagnosis not present

## 2023-09-13 DIAGNOSIS — L538 Other specified erythematous conditions: Secondary | ICD-10-CM | POA: Diagnosis not present

## 2023-09-13 DIAGNOSIS — R208 Other disturbances of skin sensation: Secondary | ICD-10-CM | POA: Diagnosis not present

## 2023-09-13 DIAGNOSIS — D485 Neoplasm of uncertain behavior of skin: Secondary | ICD-10-CM | POA: Diagnosis not present

## 2023-09-13 DIAGNOSIS — L2989 Other pruritus: Secondary | ICD-10-CM | POA: Diagnosis not present

## 2023-09-14 ENCOUNTER — Ambulatory Visit (HOSPITAL_BASED_OUTPATIENT_CLINIC_OR_DEPARTMENT_OTHER): Payer: Medicare Other | Admitting: Cardiology

## 2023-09-14 ENCOUNTER — Encounter (HOSPITAL_BASED_OUTPATIENT_CLINIC_OR_DEPARTMENT_OTHER): Payer: Self-pay | Admitting: Cardiology

## 2023-09-14 VITALS — BP 128/86 | HR 96 | Ht 65.0 in | Wt 194.1 lb

## 2023-09-14 DIAGNOSIS — R600 Localized edema: Secondary | ICD-10-CM

## 2023-09-14 DIAGNOSIS — D6869 Other thrombophilia: Secondary | ICD-10-CM | POA: Diagnosis not present

## 2023-09-14 DIAGNOSIS — Z7901 Long term (current) use of anticoagulants: Secondary | ICD-10-CM | POA: Diagnosis not present

## 2023-09-14 DIAGNOSIS — I5033 Acute on chronic diastolic (congestive) heart failure: Secondary | ICD-10-CM

## 2023-09-14 DIAGNOSIS — I10 Essential (primary) hypertension: Secondary | ICD-10-CM | POA: Diagnosis not present

## 2023-09-14 DIAGNOSIS — I4821 Permanent atrial fibrillation: Secondary | ICD-10-CM | POA: Diagnosis not present

## 2023-09-14 NOTE — Progress Notes (Signed)
 Cardiology Office Note:  .   Date:  09/14/2023  ID:  Sierra [name] 06-27-1945, MRN [id number] PCP: Emilio Aspen, MD  [facility] Providers Cardiologist:  Jodelle Red, MD {  History of Present Illness: Sierra [name] is a 78 y.o. female with PMH paroxysmal atrial fibrillation, hypertension, chronic diastolic heart failure. She is a former patient of Dr. Eldridge Dace and established care with me on 09/14/23.  Today: Here with her husband (who is also my patient). Saw her PCP at [facility] 08/31/23, when she had severe leg swelling, doubled her torsemide for three days with improvement. Hasn't taken yet today. Scale at home isn't working, so she isn't sure if weight has gone up, but swelling increased rather rapidly. She had not had an episode of severe swelling in a long time, has chronic baseline edema. No clear triggers. No change in diet, no travel, no recent illnesses.  ROS: Denies chest pain, shortness of breath at rest or with normal exertion. No PND, orthopnea, or unexpected weight gain. No syncope or palpitations. ROS otherwise negative except as noted.   Studies Reviewed: Marland Kitchen    EKG:       Physical Exam:   VS:  BP 128/86   Pulse 96   Ht 5\' 5"  (1.651 m)   Wt 194 lb 1.6 oz (88 kg)   SpO2 96%   BMI 32.30 kg/m    Wt Readings from Last 3 Encounters:  09/14/23 194 lb 1.6 oz (88 kg)  05/17/23 189 lb 12.8 oz (86.1 kg)  02/26/23 180 lb (81.6 kg)    GEN: Well nourished, well developed in no acute distress HEENT: Normal, moist mucous membranes NECK: No JVD CARDIAC: irregularly irregular rhythm, normal S1 and S2, no rubs or gallops. No murmur. VASCULAR: Radial and DP pulses 2+ bilaterally. No carotid bruits RESPIRATORY:  Clear to auscultation without rales, wheezing or rhonchi  ABDOMEN: Soft, non-tender, non-distended MUSCULOSKELETAL:  Ambulates independently with rollator SKIN: Warm and dry, bilateral nonpitting 1+ LE edema with chronic venous stasis  changes NEUROLOGIC:  Alert and oriented x 3. No focal neuro deficits noted. PSYCHIATRIC:  Normal affect    ASSESSMENT AND PLAN: .    LE edema Acute on chronic diastolic heart failure -reports acute worsening swelling, now improved after several days of additional torsemide -with acute change, last echo ~7 years ago, will repeat echo to make sure there are no new changes -appears euvolemic from CV standpoint today based on exam. Did discussed LE edema, venous insufficiency and recommendations for management. Specifically recommended compression stockings -gave information on heart failure education in AVS -discussed [place] and [place]. She would like to hold off on these at this time, pending echo results -continue torsemide 20 mg daily -she is on high dose beta blocker, which is typically not ideal for diastolic heart failure, but even with this her heart rate runs in the 90s -discussed atrial fibrillation, see below -consider entresto as below -could also consider spironolactone  Permanent atrial fibrillation -CHA2DS2/VAS Stroke Risk Points=5 -on coumadin. Per Dr. Hoyle Barr notes, declined DOAC -rate controlled on metoprolol succinate 100 mg BID -she has been managed as permanent afib. However, with HF exacerbation, we discussed possible attempt to restore rhythm. Will use size of left atrium on echo to guide further management  Hypertension -continue metoprolol as above, torsemide as above -continue valsartan 160 mg daily. Could consider changing to entresto at follow up based on cost  Dispo: 3 mos with APP  Total  time of encounter: I spent 41 minutes dedicated to the care of this patient on the date of this encounter to include pre-visit review of records, face-to-face time with the patient discussing conditions above, and clinical documentation with the electronic health record. We specifically spent time today discussing diastolic function, heart failure, heart failure  education, other factors involved in LE edema, treatment/management recommendations.   Signed, Jodelle Red, MD   Jodelle Red, MD, PhD, [facility]  [facility]  [facility] at [facility] at [facility] [address] [place], [place] [address] [phone]

## 2023-09-14 NOTE — Patient Instructions (Addendum)
 Medication Instructions:  Your physician recommends that you continue on your current medications as directed. Please refer to the Current Medication list given to you today.   *If you need a refill on your cardiac medications before your next appointment, please call your pharmacy*   Testing/Procedures: Your physician has requested that you have an echocardiogram. Echocardiography is a painless test that uses sound waves to create images of your heart. It provides your doctor with information about the size and shape of your heart and how well your heart's chambers and valves are working. This procedure takes approximately one hour. There are no restrictions for this procedure. Please do NOT wear cologne, perfume, aftershave, or lotions (deodorant is allowed). Please arrive 15 minutes prior to your appointment time.  Please note: We ask at that you not bring children with you during ultrasound (echo/ vascular) testing. Due to room size and safety concerns, children are not allowed in the ultrasound rooms during exams. Our front office staff cannot provide observation of children in our lobby area while testing is being conducted. An adult accompanying a patient to their appointment will only be allowed in the ultrasound room at the discretion of the ultrasound technician under special circumstances. We apologize for any inconvenience.    Follow-Up: At [facility], you and your health needs are our priority.  As part of our continuing mission to provide you with exceptional heart care, we have created designated Provider Care Teams.  These Care Teams include your primary Cardiologist (physician) and Advanced Practice Providers (APPs -  Physician Assistants and Nurse Practitioners) who all work together to provide you with the care you need, when you need it.  We recommend signing up for the patient portal called "MyChart".  Sign up information is provided on this After Visit Summary.   MyChart is used to connect with patients for Virtual Visits (Telemedicine).  Patients are able to view lab/test results, encounter notes, upcoming appointments, etc.  Non-urgent messages can be sent to your provider as well.   To learn more about what you can do with MyChart, go to [email or web address].    Your next appointment:   3 month(s)  Provider:   Gillian Shields, NP    Other Instructions Do the following things EVERY DAY:  Weigh yourself EVERY morning after you go to the bathroom but before you eat or drink anything. Write this number down in a weight log/diary. If you gain 3 pounds overnight or 5 pounds in a week, call the office.  Take your medicines as prescribed. If you have concerns about your medications, please call us before you stop taking them.   Eat low salt foods--Limit salt (sodium) to 2000 mg per day. This will help prevent your body from holding onto fluid. Read food labels as many processed foods have a lot of sodium, especially canned goods and prepackaged meats. If you would like some assistance choosing low sodium foods, we would be happy to set you up with a nutritionist.  Stay as active as you can everyday. Staying active will give you more energy and make your muscles stronger. Start with 5 minutes at a time and work your way up to 30 minutes a day. Break up your activities--do some in the morning and some in the afternoon. Start with 3 days per week and work your way up to 5 days as you can.  If you have chest pain, feel short of breath, dizzy, or lightheaded, STOP. If you don't feel better  after a short rest, call 911. If you do feel better, call the office to let us know you have symptoms with exercise.  Limit all fluids for the day to less than 2 liters. Fluid includes all drinks, coffee, juice, ice chips, soup, jello, and all other liquids.    Look into [employer] for fitted compression stockings: [address] [place], [place], [place] [address] [phone] [email or web address]   Otherwise you can try to see if there are other store bought stockings that are more comfortable.

## 2023-09-19 DIAGNOSIS — M6281 Muscle weakness (generalized): Secondary | ICD-10-CM | POA: Diagnosis not present

## 2023-09-19 DIAGNOSIS — S72301D Unspecified fracture of shaft of right femur, subsequent encounter for closed fracture with routine healing: Secondary | ICD-10-CM | POA: Diagnosis not present

## 2023-09-28 DIAGNOSIS — H26492 Other secondary cataract, left eye: Secondary | ICD-10-CM | POA: Diagnosis not present

## 2023-09-28 DIAGNOSIS — H524 Presbyopia: Secondary | ICD-10-CM | POA: Diagnosis not present

## 2023-09-28 DIAGNOSIS — H43813 Vitreous degeneration, bilateral: Secondary | ICD-10-CM | POA: Diagnosis not present

## 2023-09-28 DIAGNOSIS — H04123 Dry eye syndrome of bilateral lacrimal glands: Secondary | ICD-10-CM | POA: Diagnosis not present

## 2023-09-28 DIAGNOSIS — H353132 Nonexudative age-related macular degeneration, bilateral, intermediate dry stage: Secondary | ICD-10-CM | POA: Diagnosis not present

## 2023-09-28 DIAGNOSIS — H52203 Unspecified astigmatism, bilateral: Secondary | ICD-10-CM | POA: Diagnosis not present

## 2023-09-29 DIAGNOSIS — M6281 Muscle weakness (generalized): Secondary | ICD-10-CM | POA: Diagnosis not present

## 2023-09-29 DIAGNOSIS — S72301D Unspecified fracture of shaft of right femur, subsequent encounter for closed fracture with routine healing: Secondary | ICD-10-CM | POA: Diagnosis not present

## 2023-10-03 DIAGNOSIS — S72301D Unspecified fracture of shaft of right femur, subsequent encounter for closed fracture with routine healing: Secondary | ICD-10-CM | POA: Diagnosis not present

## 2023-10-03 DIAGNOSIS — M6281 Muscle weakness (generalized): Secondary | ICD-10-CM | POA: Diagnosis not present

## 2023-10-04 ENCOUNTER — Ambulatory Visit (HOSPITAL_BASED_OUTPATIENT_CLINIC_OR_DEPARTMENT_OTHER)

## 2023-10-04 DIAGNOSIS — I5033 Acute on chronic diastolic (congestive) heart failure: Secondary | ICD-10-CM | POA: Diagnosis not present

## 2023-10-04 LAB — ECHOCARDIOGRAM COMPLETE
Area-P 1/2: 2.95 cm2
S' Lateral: 2.59 cm

## 2023-10-06 DIAGNOSIS — M6218 Other rupture of muscle (nontraumatic), other site: Secondary | ICD-10-CM | POA: Diagnosis not present

## 2023-10-06 DIAGNOSIS — S72301D Unspecified fracture of shaft of right femur, subsequent encounter for closed fracture with routine healing: Secondary | ICD-10-CM | POA: Diagnosis not present

## 2023-10-07 ENCOUNTER — Encounter (HOSPITAL_BASED_OUTPATIENT_CLINIC_OR_DEPARTMENT_OTHER): Payer: Self-pay

## 2023-10-12 DIAGNOSIS — S72301D Unspecified fracture of shaft of right femur, subsequent encounter for closed fracture with routine healing: Secondary | ICD-10-CM | POA: Diagnosis not present

## 2023-10-12 DIAGNOSIS — M6281 Muscle weakness (generalized): Secondary | ICD-10-CM | POA: Diagnosis not present

## 2023-10-14 DIAGNOSIS — S72301D Unspecified fracture of shaft of right femur, subsequent encounter for closed fracture with routine healing: Secondary | ICD-10-CM | POA: Diagnosis not present

## 2023-10-14 DIAGNOSIS — M6281 Muscle weakness (generalized): Secondary | ICD-10-CM | POA: Diagnosis not present

## 2023-10-17 ENCOUNTER — Telehealth: Payer: Self-pay | Admitting: *Deleted

## 2023-10-17 DIAGNOSIS — S72301D Unspecified fracture of shaft of right femur, subsequent encounter for closed fracture with routine healing: Secondary | ICD-10-CM | POA: Diagnosis not present

## 2023-10-17 DIAGNOSIS — M6281 Muscle weakness (generalized): Secondary | ICD-10-CM | POA: Diagnosis not present

## 2023-10-17 NOTE — Telephone Encounter (Signed)
 Called pt's primary line and had to leave a message to remind her of the upcoming appointment tomorrow at the new location [address] level 5 inside the building and not parking deck.

## 2023-10-18 ENCOUNTER — Ambulatory Visit: Attending: Internal Medicine | Admitting: *Deleted

## 2023-10-18 DIAGNOSIS — Z5181 Encounter for therapeutic drug level monitoring: Secondary | ICD-10-CM | POA: Diagnosis not present

## 2023-10-18 DIAGNOSIS — I4891 Unspecified atrial fibrillation: Secondary | ICD-10-CM | POA: Insufficient documentation

## 2023-10-18 DIAGNOSIS — I480 Paroxysmal atrial fibrillation: Secondary | ICD-10-CM | POA: Diagnosis not present

## 2023-10-18 LAB — POCT INR: INR: 2.7 (ref 2.0–3.0)

## 2023-10-18 NOTE — Patient Instructions (Signed)
 Description   Continue taking warfarin 1/2 tablet daily except 1 tablet on Mondays.  Recheck INR in 6 weeks.   Call with any new medications, any new procedures, and with any concerns [phone] Procedure Fax [phone] or [phone]

## 2023-10-25 DIAGNOSIS — S72301D Unspecified fracture of shaft of right femur, subsequent encounter for closed fracture with routine healing: Secondary | ICD-10-CM | POA: Diagnosis not present

## 2023-10-25 DIAGNOSIS — M6281 Muscle weakness (generalized): Secondary | ICD-10-CM | POA: Diagnosis not present

## 2023-10-27 DIAGNOSIS — S72301D Unspecified fracture of shaft of right femur, subsequent encounter for closed fracture with routine healing: Secondary | ICD-10-CM | POA: Diagnosis not present

## 2023-10-27 DIAGNOSIS — M6281 Muscle weakness (generalized): Secondary | ICD-10-CM | POA: Diagnosis not present

## 2023-10-31 DIAGNOSIS — S72301D Unspecified fracture of shaft of right femur, subsequent encounter for closed fracture with routine healing: Secondary | ICD-10-CM | POA: Diagnosis not present

## 2023-10-31 DIAGNOSIS — M6281 Muscle weakness (generalized): Secondary | ICD-10-CM | POA: Diagnosis not present

## 2023-11-01 DIAGNOSIS — C44722 Squamous cell carcinoma of skin of right lower limb, including hip: Secondary | ICD-10-CM | POA: Diagnosis not present

## 2023-11-01 DIAGNOSIS — D485 Neoplasm of uncertain behavior of skin: Secondary | ICD-10-CM | POA: Diagnosis not present

## 2023-11-03 DIAGNOSIS — S72301D Unspecified fracture of shaft of right femur, subsequent encounter for closed fracture with routine healing: Secondary | ICD-10-CM | POA: Diagnosis not present

## 2023-11-03 DIAGNOSIS — M6281 Muscle weakness (generalized): Secondary | ICD-10-CM | POA: Diagnosis not present

## 2023-11-07 DIAGNOSIS — S72301D Unspecified fracture of shaft of right femur, subsequent encounter for closed fracture with routine healing: Secondary | ICD-10-CM | POA: Diagnosis not present

## 2023-11-07 DIAGNOSIS — M6281 Muscle weakness (generalized): Secondary | ICD-10-CM | POA: Diagnosis not present

## 2023-11-10 DIAGNOSIS — M6281 Muscle weakness (generalized): Secondary | ICD-10-CM | POA: Diagnosis not present

## 2023-11-10 DIAGNOSIS — S72301D Unspecified fracture of shaft of right femur, subsequent encounter for closed fracture with routine healing: Secondary | ICD-10-CM | POA: Diagnosis not present

## 2023-11-16 DIAGNOSIS — M6281 Muscle weakness (generalized): Secondary | ICD-10-CM | POA: Diagnosis not present

## 2023-11-16 DIAGNOSIS — S72301D Unspecified fracture of shaft of right femur, subsequent encounter for closed fracture with routine healing: Secondary | ICD-10-CM | POA: Diagnosis not present

## 2023-11-18 DIAGNOSIS — S72301D Unspecified fracture of shaft of right femur, subsequent encounter for closed fracture with routine healing: Secondary | ICD-10-CM | POA: Diagnosis not present

## 2023-11-18 DIAGNOSIS — M6281 Muscle weakness (generalized): Secondary | ICD-10-CM | POA: Diagnosis not present

## 2023-11-23 DIAGNOSIS — S72301D Unspecified fracture of shaft of right femur, subsequent encounter for closed fracture with routine healing: Secondary | ICD-10-CM | POA: Diagnosis not present

## 2023-11-23 DIAGNOSIS — M6281 Muscle weakness (generalized): Secondary | ICD-10-CM | POA: Diagnosis not present

## 2023-11-25 DIAGNOSIS — M6281 Muscle weakness (generalized): Secondary | ICD-10-CM | POA: Diagnosis not present

## 2023-11-25 DIAGNOSIS — S72301D Unspecified fracture of shaft of right femur, subsequent encounter for closed fracture with routine healing: Secondary | ICD-10-CM | POA: Diagnosis not present

## 2023-11-28 DIAGNOSIS — S72301D Unspecified fracture of shaft of right femur, subsequent encounter for closed fracture with routine healing: Secondary | ICD-10-CM | POA: Diagnosis not present

## 2023-11-28 DIAGNOSIS — M6281 Muscle weakness (generalized): Secondary | ICD-10-CM | POA: Diagnosis not present

## 2023-11-29 ENCOUNTER — Ambulatory Visit

## 2023-11-29 DIAGNOSIS — L249 Irritant contact dermatitis, unspecified cause: Secondary | ICD-10-CM | POA: Diagnosis not present

## 2023-11-29 DIAGNOSIS — C44722 Squamous cell carcinoma of skin of right lower limb, including hip: Secondary | ICD-10-CM | POA: Diagnosis not present

## 2023-11-29 DIAGNOSIS — I872 Venous insufficiency (chronic) (peripheral): Secondary | ICD-10-CM | POA: Diagnosis not present

## 2023-12-08 DIAGNOSIS — L602 Onychogryphosis: Secondary | ICD-10-CM | POA: Diagnosis not present

## 2023-12-08 DIAGNOSIS — I70203 Unspecified atherosclerosis of native arteries of extremities, bilateral legs: Secondary | ICD-10-CM | POA: Diagnosis not present

## 2023-12-08 DIAGNOSIS — L84 Corns and callosities: Secondary | ICD-10-CM | POA: Diagnosis not present

## 2023-12-13 ENCOUNTER — Ambulatory Visit: Attending: Interventional Cardiology | Admitting: *Deleted

## 2023-12-13 DIAGNOSIS — I4891 Unspecified atrial fibrillation: Secondary | ICD-10-CM | POA: Diagnosis not present

## 2023-12-13 DIAGNOSIS — Z5181 Encounter for therapeutic drug level monitoring: Secondary | ICD-10-CM | POA: Diagnosis not present

## 2023-12-13 DIAGNOSIS — I480 Paroxysmal atrial fibrillation: Secondary | ICD-10-CM | POA: Insufficient documentation

## 2023-12-13 LAB — POCT INR: INR: 2.6 (ref 2.0–3.0)

## 2023-12-13 NOTE — Progress Notes (Signed)
Please see anticoagulation encounter.

## 2023-12-13 NOTE — Patient Instructions (Signed)
 Description   Continue taking warfarin 1/2 tablet daily except 1 tablet on Mondays.  Recheck INR in 6 weeks.   Call with any new medications, any new procedures, and with any concerns [phone] Procedure Fax [phone] or [phone]

## 2023-12-15 DIAGNOSIS — Z09 Encounter for follow-up examination after completed treatment for conditions other than malignant neoplasm: Secondary | ICD-10-CM | POA: Diagnosis not present

## 2023-12-15 DIAGNOSIS — Z872 Personal history of diseases of the skin and subcutaneous tissue: Secondary | ICD-10-CM | POA: Diagnosis not present

## 2023-12-15 DIAGNOSIS — D0471 Carcinoma in situ of skin of right lower limb, including hip: Secondary | ICD-10-CM | POA: Diagnosis not present

## 2023-12-15 DIAGNOSIS — Z48817 Encounter for surgical aftercare following surgery on the skin and subcutaneous tissue: Secondary | ICD-10-CM | POA: Diagnosis not present

## 2023-12-15 DIAGNOSIS — Z08 Encounter for follow-up examination after completed treatment for malignant neoplasm: Secondary | ICD-10-CM | POA: Diagnosis not present

## 2023-12-15 DIAGNOSIS — Z85828 Personal history of other malignant neoplasm of skin: Secondary | ICD-10-CM | POA: Diagnosis not present

## 2023-12-15 DIAGNOSIS — D485 Neoplasm of uncertain behavior of skin: Secondary | ICD-10-CM | POA: Diagnosis not present

## 2023-12-26 ENCOUNTER — Ambulatory Visit (HOSPITAL_BASED_OUTPATIENT_CLINIC_OR_DEPARTMENT_OTHER): Admitting: Family

## 2023-12-26 ENCOUNTER — Encounter (HOSPITAL_BASED_OUTPATIENT_CLINIC_OR_DEPARTMENT_OTHER): Payer: Self-pay | Admitting: Family

## 2023-12-26 VITALS — BP 118/72 | HR 96 | Ht 65.0 in | Wt 193.0 lb

## 2023-12-26 DIAGNOSIS — I10 Essential (primary) hypertension: Secondary | ICD-10-CM

## 2023-12-26 DIAGNOSIS — I4821 Permanent atrial fibrillation: Secondary | ICD-10-CM

## 2023-12-26 DIAGNOSIS — I5032 Chronic diastolic (congestive) heart failure: Secondary | ICD-10-CM | POA: Diagnosis not present

## 2023-12-26 DIAGNOSIS — D6859 Other primary thrombophilia: Secondary | ICD-10-CM

## 2023-12-26 NOTE — Progress Notes (Signed)
 Cardiology Office Note   Date:  01/07/2024  ID:  Sierra [name], Sierra [name] December 15, 1945, MRN [id number] PCP: Sierra [name] LABOR, MD  [facility] Providers Cardiologist:  Sierra Bruckner, MD     History of Present Illness  Discussed the use of AI scribe software for clinical note transcription with the patient, who gave verbal consent to proceed.  History of Present Illness Sierra [name] is a 78 year old female who presents for a cardiovascular follow-up. She has a hx of permanent atrial fibrillation, HTN, HFpEF. Preivously followed by Dr. Dann having since established with Dr. Bruckner.   Last seen 09/14/23 with LE edema. Torsemide  20mg  daily continued. Echo 10/04/23 LVEF 50-55%, no RWMA, indeterminate diastolic parameters, RV normal, moderate RAE/LAE, mild MR, mild to moderate TR, aortic valve sclerosis without stenosis. Overall stable from 2017.   Presents today for follow up. We reviewed echocardiogram in detail. Not monitoring BP routinely at home but reports it has been good at recent clinic visits. Reports her breathing is overall stable, no new or worsening dyspnea. 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 Exercise limited by orthopedic issues but does her best to stay active.  ROS: Please see the history of present illness.    All other systems reviewed and are negative.   Studies Reviewed EKG Interpretation Date/Time:  Monday December 26 2023 15:42:46 EDT Ventricular Rate:  97 PR Interval:    QRS Duration:  94 QT Interval:  356 QTC Calculation: 452 R Axis:   89  Text Interpretation: Rate controlled atrial fibrillation Confirmed by Sierra [name] ([id number]) on 12/26/2023 4:23:45 PM    Cardiac Studies & Procedures   ______________________________________________________________________________________________     ECHOCARDIOGRAM  ECHOCARDIOGRAM COMPLETE  10/04/2023  Narrative ECHOCARDIOGRAM REPORT    Patient Name:   Sierra [name] Date of Exam: 10/04/2023 Medical Rec #:  [id number]     Height:       65.0 in Accession #:    [id number]    Weight:       194.1 lb Date of Birth:  09/04/1945    BSA:          1.953 m Patient Age:    77 years      BP:           128/86 mmHg Patient Gender: F             HR:           97 bpm. Exam Location:  Outpatient  Procedure: 2D Echo, 3D Echo, Cardiac Doppler, Color Doppler and Strain Analysis (Both Spectral and Color Flow Doppler were utilized during procedure).  Indications:    CHF  History:        Patient has prior history of Echocardiogram examinations, most recent 04/09/2017. Arrythmias:Atrial Fibrillation and PAC; Risk Factors:Non-Smoker, Dyslipidemia and Hypertension.  Sonographer:    Sierra [name] RDCS Referring Phys: [id number] Sierra [name]  IMPRESSIONS   1. Abnormal strain but appears to have poor tracking . Left ventricular ejection fraction, by estimation, is 50 to 55%. The left ventricle has low normal function. The left ventricle has no regional wall motion abnormalities. Left ventricular diastolic parameters are indeterminate. 2. Right ventricular systolic function is normal. The right ventricular size is normal. 3. Left atrial size was moderately dilated. 4. Right atrial size was moderately dilated. 5. The mitral valve is abnormal. Mild mitral valve regurgitation. No evidence of mitral stenosis. Moderate mitral  annular calcification. 6. Tricuspid valve regurgitation is mild to moderate. 7. The aortic valve is tricuspid. There is moderate calcification of the aortic valve. There is moderate thickening of the aortic valve. Aortic valve regurgitation is not visualized. Aortic valve sclerosis is present, with no evidence of aortic valve stenosis. 8. The inferior vena cava is normal in size with greater than 50% respiratory variability, suggesting right atrial pressure of 3  mmHg.  FINDINGS Left Ventricle: Abnormal strain but appears to have poor tracking. Left ventricular ejection fraction, by estimation, is 50 to 55%. The left ventricle has low normal function. The left ventricle has no regional wall motion abnormalities. Strain was performed and the global longitudinal strain is indeterminate. The left ventricular internal cavity size was normal in size. There is no left ventricular hypertrophy. Left ventricular diastolic parameters are indeterminate.  Right Ventricle: The right ventricular size is normal. No increase in right ventricular wall thickness. Right ventricular systolic function is normal.  Left Atrium: Left atrial size was moderately dilated.  Right Atrium: Right atrial size was moderately dilated.  Pericardium: There is no evidence of pericardial effusion.  Mitral Valve: The mitral valve is abnormal. There is mild thickening of the mitral valve leaflet(s). There is mild calcification of the mitral valve leaflet(s). Moderate mitral annular calcification. Mild mitral valve regurgitation. No evidence of mitral valve stenosis.  Tricuspid Valve: The tricuspid valve is normal in structure. Tricuspid valve regurgitation is mild to moderate. No evidence of tricuspid stenosis.  Aortic Valve: The aortic valve is tricuspid. There is moderate calcification of the aortic valve. There is moderate thickening of the aortic valve. Aortic valve regurgitation is not visualized. Aortic valve sclerosis is present, with no evidence of aortic valve stenosis.  Pulmonic Valve: The pulmonic valve was normal in structure. Pulmonic valve regurgitation is not visualized. No evidence of pulmonic stenosis.  Aorta: The aortic root is normal in size and structure.  Venous: The inferior vena cava is normal in size with greater than 50% respiratory variability, suggesting right atrial pressure of 3 mmHg.  IAS/Shunts: No atrial level shunt detected by color flow  Doppler.  Additional Comments: 3D was performed not requiring image post processing on an independent workstation and was abnormal.   LEFT VENTRICLE PLAX 2D LVIDd:         4.39 cm   Diastology LVIDs:         2.59 cm   LV e' medial:    8.59 cm/s LV PW:         1.28 cm   LV E/e' medial:  14.0 LV IVS:        1.06 cm   LV e' lateral:   11.00 cm/s LVOT diam:     1.90 cm   LV E/e' lateral: 10.9 LV SV:         41 LV SV Index:   21 LVOT Area:     2.84 cm  3D Volume EF: 3D EF:        53 % LV EDV:       117 ml LV ESV:       55 ml LV SV:        63 ml  RIGHT VENTRICLE RV Basal diam:  4.32 cm RV Mid diam:    3.61 cm RV S prime:     8.59 cm/s TAPSE (M-mode): 1.7 cm  LEFT ATRIUM             Index        RIGHT  ATRIUM           Index LA diam:        4.50 cm 2.30 cm/m   RA Area:     16.60 cm LA Vol (A2C):   58.1 ml 29.75 ml/m  RA Volume:   47.80 ml  24.47 ml/m LA Vol (A4C):   68.7 ml 35.17 ml/m LA Biplane Vol: 65.4 ml 33.48 ml/m AORTIC VALVE LVOT Vmax:   83.70 cm/s LVOT Vmean:  55.600 cm/s LVOT VTI:    0.144 m  AORTA Ao Root diam: 2.80 cm Ao Asc diam:  3.10 cm  MITRAL VALVE                TRICUSPID VALVE MV Area (PHT): 2.95 cm     TR Peak grad:   42.0 mmHg MV Decel Time: 257 msec     TR Vmax:        324.00 cm/s MV E velocity: 120.00 cm/s MV A velocity: 42.90 cm/s   SHUNTS MV E/A ratio:  2.80         Systemic VTI:  0.14 m Systemic Diam: 1.90 cm  Maude Emmer MD Electronically signed by Maude Emmer MD Signature Date/Time: 10/04/2023/5:12:01 PM    Final          ______________________________________________________________________________________________      Risk Assessment/Calculations  CHA2DS2-VASc Score = 5   This indicates a 7.2% annual risk of stroke. The patient's score is based upon: CHF History: 1 HTN History: 1 Diabetes History: 0 Stroke History: 0 Vascular Disease History: 0 Age Score: 2 Gender Score: 1           Physical Exam VS:  BP  118/72 (BP Location: Left Arm, Patient Position: Sitting, Cuff Size: Normal)   Pulse 96   Ht 5' 5 (1.651 m)   Wt 193 lb (87.5 kg)   SpO2 96%   BMI 32.12 kg/m        Wt Readings from Last 3 Encounters:  12/29/23 193 lb (87.5 kg)  12/26/23 193 lb (87.5 kg)  09/14/23 194 lb 1.6 oz (88 kg)    GEN: Well nourished, well developed in no acute distress NECK: No JVD; No carotid bruits CARDIAC: IRIR, no murmurs, rubs, gallops RESPIRATORY:  Clear to auscultation without rales, wheezing or rhonchi  ABDOMEN: Soft, non-tender, non-distended EXTREMITIES:  No edema; No deformity   ASSESSMENT AND PLAN  HFpEF / LE edema - overall euvolemic on exam. Stable echocardiogram 09/2023. Continue toprol  100mg  daily, torsemide  20mg  daily. As her symptoms are overall stable, she prefers to defer addition of SLGT2i at this time but could be considered in the future.  Permanent atrial fibrillation / hypercoagulable state - asymtpomatic, rate controlled. Continue toprol  100mg  daily. Continue warfarin. Denies bleeding complications.  HTN - BP well controlled. Continue current antihypertensive regimen.         Dispo: follow up in 4-6 mos   Signed, Reche GORMAN Finder, NP

## 2023-12-26 NOTE — Patient Instructions (Signed)
 Medication Instructions:  Continue your current medications   *If you need a refill on your cardiac medications before your next appointment, please call your pharmacy*  Follow-Up: At [facility], you and your health needs are our priority.  As part of our continuing mission to provide you with exceptional heart care, our providers are all part of one team.  This team includes your primary Cardiologist (physician) and Advanced Practice Providers or APPs (Physician Assistants and Nurse Practitioners) who all work together to provide you with the care you need, when you need it.  Your next appointment:   4-6 month(s)  Provider:   Shelda Bruckner, MD, Rosaline Bane, NP, or Reche Finder, NP    We recommend signing up for the patient portal called MyChart.  Sign up information is provided on this After Visit Summary.  MyChart is used to connect with patients for Virtual Visits (Telemedicine).  Patients are able to view lab/test results, encounter notes, upcoming appointments, etc.  Non-urgent messages can be sent to your provider as well.   To learn more about what you can do with MyChart, go to [email or web address].   Other Instructions  Heart Healthy Diet Recommendations: A low-salt diet is recommended. Meats should be grilled, baked, or boiled. Avoid fried foods. Focus on lean protein sources like fish or chicken with vegetables and fruits. The American Heart Association is a Chief Technology Officer!  American Heart Association Diet and Lifeystyle Recommendations   Exercise recommendations: The American Heart Association recommends 150 minutes of moderate intensity exercise weekly. Try 30 minutes of moderate intensity exercise 4-5 times per week. This could include walking, jogging, or swimming.  To prevent or reduce lower extremity swelling: Eat a low salt diet. Salt makes the body hold onto extra fluid which causes swelling. Sit with legs elevated. For example, in the  recliner or on an ottoman.  Wear knee-high compression stockings during the daytime. Ones labeled 15-20 mmHg provide good compression.

## 2023-12-29 ENCOUNTER — Other Ambulatory Visit: Payer: Self-pay

## 2023-12-29 ENCOUNTER — Encounter (HOSPITAL_BASED_OUTPATIENT_CLINIC_OR_DEPARTMENT_OTHER): Payer: Self-pay | Admitting: Emergency Medicine

## 2023-12-29 ENCOUNTER — Other Ambulatory Visit (HOSPITAL_BASED_OUTPATIENT_CLINIC_OR_DEPARTMENT_OTHER): Payer: Self-pay

## 2023-12-29 ENCOUNTER — Emergency Department (HOSPITAL_BASED_OUTPATIENT_CLINIC_OR_DEPARTMENT_OTHER)
Admission: EM | Admit: 2023-12-29 | Discharge: 2023-12-29 | Disposition: A | Discharge status: Home / Self Care | Source: Ambulatory Visit | Attending: Emergency Medicine | Admitting: Emergency Medicine

## 2023-12-29 DIAGNOSIS — T8149XA Infection following a procedure, other surgical site, initial encounter: Secondary | ICD-10-CM | POA: Insufficient documentation

## 2023-12-29 DIAGNOSIS — Y828 Other medical devices associated with adverse incidents: Secondary | ICD-10-CM | POA: Insufficient documentation

## 2023-12-29 DIAGNOSIS — Z79899 Other long term (current) drug therapy: Secondary | ICD-10-CM | POA: Insufficient documentation

## 2023-12-29 DIAGNOSIS — L089 Local infection of the skin and subcutaneous tissue, unspecified: Secondary | ICD-10-CM | POA: Diagnosis not present

## 2023-12-29 DIAGNOSIS — M7989 Other specified soft tissue disorders: Secondary | ICD-10-CM | POA: Diagnosis present

## 2023-12-29 LAB — CBC WITH DIFFERENTIAL/PLATELET
Abs Immature Granulocytes: 0.01 K/uL (ref 0.00–0.07)
Basophils Absolute: 0.1 K/uL (ref 0.0–0.1)
Basophils Relative: 1 %
Eosinophils Absolute: 0.1 K/uL (ref 0.0–0.5)
Eosinophils Relative: 2 %
HCT: 33.3 % — ABNORMAL LOW (ref 36.0–46.0)
Hemoglobin: 11.2 g/dL — ABNORMAL LOW (ref 12.0–15.0)
Immature Granulocytes: 0 %
Lymphocytes Relative: 11 %
Lymphs Abs: 0.7 K/uL (ref 0.7–4.0)
MCH: 31.5 pg (ref 26.0–34.0)
MCHC: 33.6 g/dL (ref 30.0–36.0)
MCV: 93.5 fL (ref 80.0–100.0)
Monocytes Absolute: 0.5 K/uL (ref 0.1–1.0)
Monocytes Relative: 8 %
Neutro Abs: 4.6 K/uL (ref 1.7–7.7)
Neutrophils Relative %: 78 %
Platelets: 199 K/uL (ref 150–400)
RBC: 3.56 MIL/uL — ABNORMAL LOW (ref 3.87–5.11)
RDW: 13.4 % (ref 11.5–15.5)
WBC: 6 K/uL (ref 4.0–10.5)
nRBC: 0 % (ref 0.0–0.2)

## 2023-12-29 LAB — BASIC METABOLIC PANEL WITH GFR
Anion gap: 13 (ref 5–15)
BUN: 31 mg/dL — ABNORMAL HIGH (ref 8–23)
CO2: 22 mmol/L (ref 22–32)
Calcium: 9.8 mg/dL (ref 8.9–10.3)
Chloride: 105 mmol/L (ref 98–111)
Creatinine, Ser: 0.93 mg/dL (ref 0.44–1.00)
GFR, Estimated: >60 mL/min (ref >60)
Glucose, Bld: 109 mg/dL — ABNORMAL HIGH (ref 70–99)
Potassium: 3.8 mmol/L (ref 3.5–5.1)
Sodium: 140 mmol/L (ref 135–145)

## 2023-12-29 MED ORDER — DOXYCYCLINE HYCLATE 100 MG PO CAPS
100.0000 mg | ORAL_CAPSULE | Freq: Two times a day (BID) | ORAL | 0 refills | Status: AC
  Filled 2023-12-29: qty 20, 10d supply, fill #0

## 2023-12-29 MED ORDER — BACITRACIN 500 UNIT/GM EX OINT
1.0000 | TOPICAL_OINTMENT | Freq: Every day | CUTANEOUS | 0 refills | Status: AC
Start: 2023-12-29 — End: ?
  Filled 2023-12-29: qty 28, 31d supply, fill #0

## 2023-12-29 MED ORDER — BACITRACIN ZINC 500 UNIT/GM EX OINT: TOPICAL_OINTMENT | Freq: Once | CUTANEOUS | Status: DC

## 2023-12-29 NOTE — ED Notes (Signed)
 Reviewed AVS/discharge instruction with patient. Time allotted for and all questions answered. Patient is agreeable for d/c and escorted to ed exit by staff.

## 2023-12-29 NOTE — ED Provider Notes (Signed)
 [facility] EMERGENCY DEPARTMENT AT [facility] Provider Note   CSN: [id number] Arrival date & time: 12/29/23  1353     Patient presents with: Wound Infection   Sierra [name] is a 78 y.o. female.   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  She was in [employer] for about 2 weeks.  She reports that the wound had not healed when they remove the dressing.  She reports she also had leg swelling at that time.  The swelling has improved since the boot came off and elevating and activity.  She does not have pain but the wound has increasing yellow-green drainage and surrounding redness.  They are concerned for infection associated with a nonhealing wound.  She has not able to be seen by her dermatologist until next week.  No fever no chills no malaise.  Otherwise feels well.    Prior to Admission medications   Medication Sig Start Date End Date Taking? Authorizing Provider  bacitracin  500 UNIT/GM ointment Apply to the affected area(s) topically daily. 12/29/23  Yes Armenta Canning, MD  doxycycline  (VIBRAMYCIN ) 100 MG capsule Take 1 capsule (100 mg total) by mouth 2 (two) times daily. 12/29/23  Yes Armenta Canning, MD  acetaminophen  (TYLENOL ) 650 MG CR tablet Take 1,300 mg by mouth 2 (two) times daily. 07/16/20   [provider]  Cholecalciferol  (VITAMIN D -3) 5000 UNITS TABS Take 5,000 Units by mouth every Monday, Wednesday, and Friday.     [provider]  citalopram  (CELEXA ) 20 MG tablet Take 20 mg by mouth daily. 07/31/21   [provider]  Coenzyme Q10 (CO Q-10) 100 MG CAPS Take 100 mg by mouth at bedtime.    [provider]  colestipol  (COLESTID ) 5 g packet Take 5 g by mouth 2 (two) times daily.  01/06/17   [provider]  docusate sodium  (COLACE) 100 MG capsule Take 1 capsule (100 mg total) by mouth 2 (two) times daily. 03/03/23   Danton Lauraine LABOR, PA-C  ferrous sulfate  325 (65 FE) MG EC tablet Take 1  tablet (325 mg total) by mouth every other day. Patient not taking: Reported on 12/26/2023 03/03/23 04/12/23  Elicia Hamlet, MD  fexofenadine (ALLEGRA) 180 MG tablet Take 180 mg by mouth at bedtime.    [provider]  Menthol , Topical Analgesic, (BIOFREEZE) 4 % GEL Apply 1 application. topically daily as needed (pain).    [provider]  methocarbamol  (ROBAXIN ) 500 MG tablet Take 1 tablet (500 mg total) by mouth every 6 (six) hours as needed for muscle spasms. 03/03/23   Danton Lauraine LABOR, PA-C  metoprolol  succinate (TOPROL -XL) 100 MG 24 hr tablet Take 1 tablet (100 mg total) by mouth in the morning and at bedtime. Take with or immediately following a meal. 08/25/23   Walker, Caitlin S, NP  Multiple Vitamins-Minerals (MULTIVITAMIN WITH MINERALS) tablet Take 1 tablet by mouth daily. 50+ for her    [provider]  Multiple Vitamins-Minerals (PRESERVISION AREDS 2 PO) Take by mouth.    [provider]  oxyCODONE  (OXY IR/ROXICODONE ) 5 MG immediate release tablet Take 1 tablet (5 mg total) by mouth every 4 (four) hours as needed for moderate pain or severe pain. 03/03/23   Danton Lauraine LABOR, PA-C  potassium chloride  (KLOR-CON ) 10 MEQ tablet Take 10 mEq by mouth at bedtime.    [provider]  RABEprazole (ACIPHEX) 20 MG tablet Take 20 mg by mouth daily.  [provider]  torsemide  (DEMADEX ) 20 MG tablet Take 20 mg by mouth daily.    [provider]  valsartan  (DIOVAN ) 160 MG tablet Take 160 mg by mouth at bedtime. 12/18/21   [provider]  warfarin (COUMADIN ) 5 MG tablet 2.5mg  PO daily every day, except 5mg  PO daily on Monday and Friday 03/03/23   Zheng, Jacky, MD    Allergies: Ace inhibitors, Codeine, Erythromycin, Iohexol , and Iodinated contrast media    Review of Systems  Updated Vital Signs BP (!) 144/87 (BP Location: Right Arm)   Pulse 98   Temp 98 F (36.7 C)   Resp 17   Ht 5' 5 (1.651 m)   Wt 87.5 kg   SpO2 94%   BMI  32.12 kg/m   Physical Exam Constitutional:      Comments: Alert nontoxic clinically well appearance.  No respiratory distress.  Pulmonary:     Effort: Pulmonary effort is normal.  Musculoskeletal:     Comments: Ulcerated wound with exudative discharge to the right lower leg.  Surrounding blanching warm erythema.  See attached images.  Neurological:     General: No focal deficit present.     Mental Status: She is oriented to person, place, and time.     Coordination: Coordination normal.  Psychiatric:        Mood and Affect: Mood normal.     (all labs ordered are listed, but only abnormal results are displayed) Labs Reviewed  CBC WITH DIFFERENTIAL/PLATELET - Abnormal; Notable for the following components:      Result Value   RBC 3.56 (*)    Hemoglobin 11.2 (*)    HCT 33.3 (*)    All other components within normal limits  BASIC METABOLIC PANEL WITH GFR - Abnormal; Notable for the following components:   Glucose, Bld 109 (*)    BUN 31 (*)    All other components within normal limits    EKG: None  Radiology: No results found.   Procedures   Medications Ordered in the ED  bacitracin  ointment (has no administration in time range)                                    Medical Decision Making Risk OTC drugs. Prescription drug management.   Patient presents with a 18-week-old nonhealing biopsy site.  At this time she has developed green and yellow discharge and blanching surrounding erythema suggestive of cellulitis.  Patient is nontoxic.  No constitutional symptoms.  Otherwise feels well.  At this time will initiate antibiotic therapy and start doxycycline  twice daily with bacitracin  dressing changes daily.  They have established follow-up with dermatology next week.  Return precautions and home care reviewed.  Patient discharged in good condition.     Final diagnoses:  Wound infection    ED Discharge Orders          Ordered    doxycycline  (VIBRAMYCIN ) 100 MG  capsule  2 times daily        12/29/23 1530    bacitracin  500 UNIT/GM ointment  Daily        12/29/23 1530               Armenta Canning, MD 12/29/23 1535

## 2023-12-29 NOTE — ED Triage Notes (Signed)
 About 4 weeks ago , had 1 area of squamous cell removed. Had a una boot for 2 weeks. Wound was red/inflamed,seen by dermatologist who was reluctant to treat. Is here today because not getting better and green drainage on bandage.  No fevers. No n/v

## 2023-12-29 NOTE — Discharge Instructions (Addendum)
 1.  Gently wash the wound daily with mild soap and water .  Rinse well.  Pat dry with clean gauze.  Apply bacitracin  ointment and nonstick dressing daily. 2.  Take doxycycline  twice daily as prescribed. 3.  Try to elevate your leg is much as possible. 4.  Follow-up with your dermatologist next week as planned.  Return to the emergency department if you are developing fever, increasing redness and pain or other concerning changes.

## 2023-12-30 ENCOUNTER — Telehealth: Payer: Self-pay

## 2023-12-30 NOTE — Telephone Encounter (Signed)
 Pt called and stated she was in ED yesterday due to wound on her leg and was prescribed doxycycline  100mg  BID x 10 days. She will start this medication today, 12/30/23. I instructed pt to only take 1/2 tablet daily, eat extra greens (Vit K), and come to coumadin  clinic on Wednesday, 01/04/24 at 2:30pm. Pt verbalized understanding.

## 2024-01-04 ENCOUNTER — Ambulatory Visit: Attending: Interventional Cardiology | Admitting: *Deleted

## 2024-01-04 DIAGNOSIS — Z5181 Encounter for therapeutic drug level monitoring: Secondary | ICD-10-CM | POA: Diagnosis not present

## 2024-01-04 DIAGNOSIS — I4891 Unspecified atrial fibrillation: Secondary | ICD-10-CM | POA: Insufficient documentation

## 2024-01-04 DIAGNOSIS — I480 Paroxysmal atrial fibrillation: Secondary | ICD-10-CM | POA: Diagnosis not present

## 2024-01-04 LAB — POCT INR: INR: 1.9 — AB (ref 2.0–3.0)

## 2024-01-04 NOTE — Progress Notes (Signed)
Please see anticoagulation encounter.

## 2024-01-04 NOTE — Patient Instructions (Signed)
 Description   Since you will be finishing doxycyline on Sunday continue taking warfarin 1/2 tablet daily except 1 tablet on Mondays.  Recheck INR in 3 weeks.   Call with any new medications, any new procedures, and with any concerns [phone] Procedure Fax [phone] or [phone]

## 2024-01-05 DIAGNOSIS — X32XXXA Exposure to sunlight, initial encounter: Secondary | ICD-10-CM | POA: Diagnosis not present

## 2024-01-05 DIAGNOSIS — Z08 Encounter for follow-up examination after completed treatment for malignant neoplasm: Secondary | ICD-10-CM | POA: Diagnosis not present

## 2024-01-05 DIAGNOSIS — Z09 Encounter for follow-up examination after completed treatment for conditions other than malignant neoplasm: Secondary | ICD-10-CM | POA: Diagnosis not present

## 2024-01-05 DIAGNOSIS — Z872 Personal history of diseases of the skin and subcutaneous tissue: Secondary | ICD-10-CM | POA: Diagnosis not present

## 2024-01-05 DIAGNOSIS — L570 Actinic keratosis: Secondary | ICD-10-CM | POA: Diagnosis not present

## 2024-01-05 DIAGNOSIS — Z85828 Personal history of other malignant neoplasm of skin: Secondary | ICD-10-CM | POA: Diagnosis not present

## 2024-01-05 DIAGNOSIS — Z48817 Encounter for surgical aftercare following surgery on the skin and subcutaneous tissue: Secondary | ICD-10-CM | POA: Diagnosis not present

## 2024-01-05 DIAGNOSIS — D049 Carcinoma in situ of skin, unspecified: Secondary | ICD-10-CM | POA: Diagnosis not present

## 2024-01-05 DIAGNOSIS — L814 Other melanin hyperpigmentation: Secondary | ICD-10-CM | POA: Diagnosis not present

## 2024-01-05 DIAGNOSIS — D225 Melanocytic nevi of trunk: Secondary | ICD-10-CM | POA: Diagnosis not present

## 2024-01-05 DIAGNOSIS — L821 Other seborrheic keratosis: Secondary | ICD-10-CM | POA: Diagnosis not present

## 2024-01-24 ENCOUNTER — Ambulatory Visit

## 2024-01-26 ENCOUNTER — Ambulatory Visit: Attending: Interventional Cardiology

## 2024-01-26 DIAGNOSIS — Z5181 Encounter for therapeutic drug level monitoring: Secondary | ICD-10-CM | POA: Insufficient documentation

## 2024-01-26 DIAGNOSIS — I480 Paroxysmal atrial fibrillation: Secondary | ICD-10-CM | POA: Diagnosis not present

## 2024-01-26 LAB — POCT INR: INR: 2.7 (ref 2.0–3.0)

## 2024-01-26 NOTE — Patient Instructions (Signed)
 Description   Continue taking warfarin 1/2 tablet daily except 1 tablet on Mondays.  Recheck INR in 5 weeks.   Call with any new medications, any new procedures, and with any concerns [phone] Procedure Fax [phone] or [phone]

## 2024-01-26 NOTE — Progress Notes (Signed)
 INR 2.7  Please see anticoagulation encounter

## 2024-02-09 ENCOUNTER — Encounter (HOSPITAL_BASED_OUTPATIENT_CLINIC_OR_DEPARTMENT_OTHER): Attending: Internal Medicine | Admitting: Internal Medicine

## 2024-02-09 DIAGNOSIS — D0471 Carcinoma in situ of skin of right lower limb, including hip: Secondary | ICD-10-CM | POA: Diagnosis not present

## 2024-02-09 DIAGNOSIS — L97812 Non-pressure chronic ulcer of other part of right lower leg with fat layer exposed: Secondary | ICD-10-CM | POA: Insufficient documentation

## 2024-02-09 DIAGNOSIS — T8131XA Disruption of external operation (surgical) wound, not elsewhere classified, initial encounter: Secondary | ICD-10-CM | POA: Insufficient documentation

## 2024-02-09 DIAGNOSIS — I87311 Chronic venous hypertension (idiopathic) with ulcer of right lower extremity: Secondary | ICD-10-CM | POA: Insufficient documentation

## 2024-02-09 DIAGNOSIS — X58XXXA Exposure to other specified factors, initial encounter: Secondary | ICD-10-CM | POA: Insufficient documentation

## 2024-02-16 ENCOUNTER — Encounter (HOSPITAL_BASED_OUTPATIENT_CLINIC_OR_DEPARTMENT_OTHER): Admitting: Internal Medicine

## 2024-02-16 DIAGNOSIS — T8131XA Disruption of external operation (surgical) wound, not elsewhere classified, initial encounter: Secondary | ICD-10-CM

## 2024-02-16 DIAGNOSIS — L97812 Non-pressure chronic ulcer of other part of right lower leg with fat layer exposed: Secondary | ICD-10-CM | POA: Diagnosis not present

## 2024-02-16 DIAGNOSIS — D0471 Carcinoma in situ of skin of right lower limb, including hip: Secondary | ICD-10-CM | POA: Diagnosis not present

## 2024-02-16 DIAGNOSIS — I87311 Chronic venous hypertension (idiopathic) with ulcer of right lower extremity: Secondary | ICD-10-CM | POA: Diagnosis not present

## 2024-02-17 DIAGNOSIS — L84 Corns and callosities: Secondary | ICD-10-CM | POA: Diagnosis not present

## 2024-02-17 DIAGNOSIS — I70203 Unspecified atherosclerosis of native arteries of extremities, bilateral legs: Secondary | ICD-10-CM | POA: Diagnosis not present

## 2024-02-17 DIAGNOSIS — L602 Onychogryphosis: Secondary | ICD-10-CM | POA: Diagnosis not present

## 2024-02-22 ENCOUNTER — Encounter (HOSPITAL_BASED_OUTPATIENT_CLINIC_OR_DEPARTMENT_OTHER): Attending: Internal Medicine | Admitting: Internal Medicine

## 2024-02-22 DIAGNOSIS — T8131XA Disruption of external operation (surgical) wound, not elsewhere classified, initial encounter: Secondary | ICD-10-CM | POA: Diagnosis not present

## 2024-02-22 DIAGNOSIS — I87311 Chronic venous hypertension (idiopathic) with ulcer of right lower extremity: Secondary | ICD-10-CM | POA: Insufficient documentation

## 2024-02-22 DIAGNOSIS — Y838 Other surgical procedures as the cause of abnormal reaction of the patient, or of later complication, without mention of misadventure at the time of the procedure: Secondary | ICD-10-CM | POA: Diagnosis not present

## 2024-02-22 DIAGNOSIS — Z85828 Personal history of other malignant neoplasm of skin: Secondary | ICD-10-CM | POA: Diagnosis not present

## 2024-02-22 DIAGNOSIS — L97812 Non-pressure chronic ulcer of other part of right lower leg with fat layer exposed: Secondary | ICD-10-CM | POA: Insufficient documentation

## 2024-02-29 ENCOUNTER — Encounter (HOSPITAL_BASED_OUTPATIENT_CLINIC_OR_DEPARTMENT_OTHER): Admitting: Internal Medicine

## 2024-02-29 DIAGNOSIS — T8131XA Disruption of external operation (surgical) wound, not elsewhere classified, initial encounter: Secondary | ICD-10-CM

## 2024-02-29 DIAGNOSIS — Z85828 Personal history of other malignant neoplasm of skin: Secondary | ICD-10-CM | POA: Diagnosis not present

## 2024-02-29 DIAGNOSIS — I87311 Chronic venous hypertension (idiopathic) with ulcer of right lower extremity: Secondary | ICD-10-CM

## 2024-02-29 DIAGNOSIS — L97812 Non-pressure chronic ulcer of other part of right lower leg with fat layer exposed: Secondary | ICD-10-CM

## 2024-03-01 ENCOUNTER — Ambulatory Visit: Attending: Interventional Cardiology

## 2024-03-01 DIAGNOSIS — I480 Paroxysmal atrial fibrillation: Secondary | ICD-10-CM | POA: Diagnosis not present

## 2024-03-01 DIAGNOSIS — Z5181 Encounter for therapeutic drug level monitoring: Secondary | ICD-10-CM | POA: Insufficient documentation

## 2024-03-01 LAB — POCT INR: INR: 3.2 — AB (ref 2.0–3.0)

## 2024-03-01 NOTE — Patient Instructions (Signed)
 Continue taking warfarin 1/2 tablet daily except 1 tablet on Mondays. Eat greens tonight or tomorrow. Recheck INR in 5 weeks.   Call with any new medications, any new procedures, and with any concerns [phone] Procedure Fax [phone] or [phone]

## 2024-03-01 NOTE — Progress Notes (Signed)
 INR 3.2 Please see anticoagulation encounter Continue taking warfarin 1/2 tablet daily except 1 tablet on Mondays. Eat greens tonight or tomorrow. Recheck INR in 5 weeks.   Call with any new medications, any new procedures, and with any concerns [phone] Procedure Fax [phone] or [phone]

## 2024-03-03 ENCOUNTER — Other Ambulatory Visit: Payer: Self-pay | Admitting: Family Medicine

## 2024-03-03 DIAGNOSIS — I480 Paroxysmal atrial fibrillation: Secondary | ICD-10-CM

## 2024-03-05 ENCOUNTER — Other Ambulatory Visit: Payer: Self-pay | Admitting: Family

## 2024-03-05 DIAGNOSIS — I4821 Permanent atrial fibrillation: Secondary | ICD-10-CM

## 2024-03-05 MED ORDER — METOPROLOL SUCCINATE ER 100 MG PO TB24: 100.0000 mg | ORAL_TABLET | Freq: Two times a day (BID) | ORAL | 2 refills | Status: AC

## 2024-03-07 DIAGNOSIS — Z23 Encounter for immunization: Secondary | ICD-10-CM | POA: Diagnosis not present

## 2024-03-07 DIAGNOSIS — I480 Paroxysmal atrial fibrillation: Secondary | ICD-10-CM | POA: Diagnosis not present

## 2024-03-07 DIAGNOSIS — T148XXA Other injury of unspecified body region, initial encounter: Secondary | ICD-10-CM | POA: Diagnosis not present

## 2024-03-07 DIAGNOSIS — Z789 Other specified health status: Secondary | ICD-10-CM | POA: Diagnosis not present

## 2024-03-07 DIAGNOSIS — Z7409 Other reduced mobility: Secondary | ICD-10-CM | POA: Diagnosis not present

## 2024-03-07 DIAGNOSIS — I110 Hypertensive heart disease with heart failure: Secondary | ICD-10-CM | POA: Diagnosis not present

## 2024-03-07 DIAGNOSIS — E785 Hyperlipidemia, unspecified: Secondary | ICD-10-CM | POA: Diagnosis not present

## 2024-03-07 DIAGNOSIS — E559 Vitamin D deficiency, unspecified: Secondary | ICD-10-CM | POA: Diagnosis not present

## 2024-03-07 DIAGNOSIS — F32A Depression, unspecified: Secondary | ICD-10-CM | POA: Diagnosis not present

## 2024-03-07 DIAGNOSIS — N1831 Chronic kidney disease, stage 3a: Secondary | ICD-10-CM | POA: Diagnosis not present

## 2024-03-07 DIAGNOSIS — S72491D Other fracture of lower end of right femur, subsequent encounter for closed fracture with routine healing: Secondary | ICD-10-CM | POA: Diagnosis not present

## 2024-03-07 DIAGNOSIS — I5032 Chronic diastolic (congestive) heart failure: Secondary | ICD-10-CM | POA: Diagnosis not present

## 2024-03-07 DIAGNOSIS — T7840XD Allergy, unspecified, subsequent encounter: Secondary | ICD-10-CM | POA: Diagnosis not present

## 2024-03-08 ENCOUNTER — Encounter (HOSPITAL_BASED_OUTPATIENT_CLINIC_OR_DEPARTMENT_OTHER): Admitting: Internal Medicine

## 2024-03-08 DIAGNOSIS — L97812 Non-pressure chronic ulcer of other part of right lower leg with fat layer exposed: Secondary | ICD-10-CM | POA: Diagnosis not present

## 2024-03-08 DIAGNOSIS — D0471 Carcinoma in situ of skin of right lower limb, including hip: Secondary | ICD-10-CM | POA: Diagnosis not present

## 2024-03-08 DIAGNOSIS — I87311 Chronic venous hypertension (idiopathic) with ulcer of right lower extremity: Secondary | ICD-10-CM | POA: Diagnosis not present

## 2024-03-08 DIAGNOSIS — T8131XA Disruption of external operation (surgical) wound, not elsewhere classified, initial encounter: Secondary | ICD-10-CM

## 2024-03-08 DIAGNOSIS — D485 Neoplasm of uncertain behavior of skin: Secondary | ICD-10-CM | POA: Diagnosis not present

## 2024-03-08 DIAGNOSIS — Z85828 Personal history of other malignant neoplasm of skin: Secondary | ICD-10-CM | POA: Diagnosis not present

## 2024-03-09 ENCOUNTER — Telehealth: Payer: Self-pay | Admitting: Cardiology

## 2024-03-09 ENCOUNTER — Other Ambulatory Visit: Payer: Self-pay

## 2024-03-09 DIAGNOSIS — I480 Paroxysmal atrial fibrillation: Secondary | ICD-10-CM

## 2024-03-09 MED ORDER — WARFARIN SODIUM 5 MG PO TABS: ORAL_TABLET | ORAL | 1 refills | Status: DC

## 2024-03-09 NOTE — Telephone Encounter (Signed)
*  STAT* If patient is at the pharmacy, call can be transferred to refill team.   1. Which medications need to be refilled? (please list name of each medication and dose if known)   warfarin (COUMADIN ) 5 MG tablet   2. Which pharmacy/location (including street and city if local pharmacy) is medication to be sent to?  [id number] - [place], [place] - [address]      3. Do they need a 30 day or 90 day supply? 90 day

## 2024-03-15 ENCOUNTER — Encounter (HOSPITAL_BASED_OUTPATIENT_CLINIC_OR_DEPARTMENT_OTHER): Admitting: Internal Medicine

## 2024-03-15 DIAGNOSIS — T8131XA Disruption of external operation (surgical) wound, not elsewhere classified, initial encounter: Secondary | ICD-10-CM | POA: Diagnosis not present

## 2024-03-15 DIAGNOSIS — I87311 Chronic venous hypertension (idiopathic) with ulcer of right lower extremity: Secondary | ICD-10-CM

## 2024-03-15 DIAGNOSIS — L97812 Non-pressure chronic ulcer of other part of right lower leg with fat layer exposed: Secondary | ICD-10-CM | POA: Diagnosis not present

## 2024-03-15 DIAGNOSIS — Z85828 Personal history of other malignant neoplasm of skin: Secondary | ICD-10-CM | POA: Diagnosis not present

## 2024-03-21 ENCOUNTER — Encounter (HOSPITAL_BASED_OUTPATIENT_CLINIC_OR_DEPARTMENT_OTHER): Attending: Internal Medicine | Admitting: Internal Medicine

## 2024-03-21 DIAGNOSIS — I87311 Chronic venous hypertension (idiopathic) with ulcer of right lower extremity: Secondary | ICD-10-CM | POA: Insufficient documentation

## 2024-03-21 DIAGNOSIS — T8131XA Disruption of external operation (surgical) wound, not elsewhere classified, initial encounter: Secondary | ICD-10-CM | POA: Insufficient documentation

## 2024-03-21 DIAGNOSIS — D0471 Carcinoma in situ of skin of right lower limb, including hip: Secondary | ICD-10-CM | POA: Insufficient documentation

## 2024-03-21 DIAGNOSIS — Z23 Encounter for immunization: Secondary | ICD-10-CM | POA: Diagnosis not present

## 2024-03-21 DIAGNOSIS — L97812 Non-pressure chronic ulcer of other part of right lower leg with fat layer exposed: Secondary | ICD-10-CM | POA: Diagnosis not present

## 2024-03-21 DIAGNOSIS — L97818 Non-pressure chronic ulcer of other part of right lower leg with other specified severity: Secondary | ICD-10-CM | POA: Insufficient documentation

## 2024-03-28 ENCOUNTER — Encounter (HOSPITAL_BASED_OUTPATIENT_CLINIC_OR_DEPARTMENT_OTHER): Admitting: Internal Medicine

## 2024-03-28 DIAGNOSIS — I87311 Chronic venous hypertension (idiopathic) with ulcer of right lower extremity: Secondary | ICD-10-CM

## 2024-03-28 DIAGNOSIS — L97812 Non-pressure chronic ulcer of other part of right lower leg with fat layer exposed: Secondary | ICD-10-CM

## 2024-03-28 DIAGNOSIS — D0471 Carcinoma in situ of skin of right lower limb, including hip: Secondary | ICD-10-CM | POA: Diagnosis not present

## 2024-03-28 DIAGNOSIS — T8131XA Disruption of external operation (surgical) wound, not elsewhere classified, initial encounter: Secondary | ICD-10-CM

## 2024-03-28 DIAGNOSIS — L97818 Non-pressure chronic ulcer of other part of right lower leg with other specified severity: Secondary | ICD-10-CM | POA: Diagnosis not present

## 2024-04-02 DIAGNOSIS — M25551 Pain in right hip: Secondary | ICD-10-CM | POA: Diagnosis not present

## 2024-04-02 DIAGNOSIS — M25561 Pain in right knee: Secondary | ICD-10-CM | POA: Diagnosis not present

## 2024-04-04 ENCOUNTER — Encounter (HOSPITAL_BASED_OUTPATIENT_CLINIC_OR_DEPARTMENT_OTHER): Admitting: Internal Medicine

## 2024-04-04 ENCOUNTER — Other Ambulatory Visit: Payer: Self-pay | Admitting: Cardiology

## 2024-04-04 DIAGNOSIS — I480 Paroxysmal atrial fibrillation: Secondary | ICD-10-CM

## 2024-04-04 DIAGNOSIS — L97812 Non-pressure chronic ulcer of other part of right lower leg with fat layer exposed: Secondary | ICD-10-CM

## 2024-04-04 DIAGNOSIS — D0471 Carcinoma in situ of skin of right lower limb, including hip: Secondary | ICD-10-CM | POA: Diagnosis not present

## 2024-04-04 DIAGNOSIS — I87311 Chronic venous hypertension (idiopathic) with ulcer of right lower extremity: Secondary | ICD-10-CM

## 2024-04-04 DIAGNOSIS — T8131XA Disruption of external operation (surgical) wound, not elsewhere classified, initial encounter: Secondary | ICD-10-CM | POA: Diagnosis not present

## 2024-04-04 DIAGNOSIS — L97818 Non-pressure chronic ulcer of other part of right lower leg with other specified severity: Secondary | ICD-10-CM | POA: Diagnosis not present

## 2024-04-04 NOTE — Telephone Encounter (Signed)
 Warfarin 5mg  refill Afib Last INR 03/01/24 Last OV 12/26/23

## 2024-04-05 ENCOUNTER — Ambulatory Visit: Attending: Interventional Cardiology

## 2024-04-05 DIAGNOSIS — Z5181 Encounter for therapeutic drug level monitoring: Secondary | ICD-10-CM | POA: Insufficient documentation

## 2024-04-05 DIAGNOSIS — I480 Paroxysmal atrial fibrillation: Secondary | ICD-10-CM | POA: Insufficient documentation

## 2024-04-05 LAB — POCT INR: INR: 2.2 (ref 2.0–3.0)

## 2024-04-05 NOTE — Patient Instructions (Signed)
 Continue taking warfarin 1/2 tablet daily except 1 tablet on Mondays.  Recheck INR in 6 weeks.   Call with any new medications, any new procedures, and with any concerns [phone] Procedure Fax [phone] or [phone]

## 2024-04-05 NOTE — Progress Notes (Signed)
 INR 2.2 Please see anticoagulation encounter Continue taking warfarin 1/2 tablet daily except 1 tablet on Mondays. Recheck INR in 6 weeks.   Call with any new medications, any new procedures, and with any concerns [phone] Procedure Fax [phone] or [phone]

## 2024-04-10 DIAGNOSIS — Z48817 Encounter for surgical aftercare following surgery on the skin and subcutaneous tissue: Secondary | ICD-10-CM | POA: Diagnosis not present

## 2024-04-11 ENCOUNTER — Encounter (HOSPITAL_BASED_OUTPATIENT_CLINIC_OR_DEPARTMENT_OTHER): Admitting: Internal Medicine

## 2024-04-11 DIAGNOSIS — T8131XA Disruption of external operation (surgical) wound, not elsewhere classified, initial encounter: Secondary | ICD-10-CM | POA: Diagnosis not present

## 2024-04-11 DIAGNOSIS — I87311 Chronic venous hypertension (idiopathic) with ulcer of right lower extremity: Secondary | ICD-10-CM

## 2024-04-11 DIAGNOSIS — L97818 Non-pressure chronic ulcer of other part of right lower leg with other specified severity: Secondary | ICD-10-CM

## 2024-04-11 DIAGNOSIS — L97812 Non-pressure chronic ulcer of other part of right lower leg with fat layer exposed: Secondary | ICD-10-CM

## 2024-04-11 DIAGNOSIS — D0471 Carcinoma in situ of skin of right lower limb, including hip: Secondary | ICD-10-CM | POA: Diagnosis not present

## 2024-04-12 DIAGNOSIS — M25661 Stiffness of right knee, not elsewhere classified: Secondary | ICD-10-CM | POA: Diagnosis not present

## 2024-04-12 DIAGNOSIS — M7631 Iliotibial band syndrome, right leg: Secondary | ICD-10-CM | POA: Diagnosis not present

## 2024-04-16 DIAGNOSIS — M7631 Iliotibial band syndrome, right leg: Secondary | ICD-10-CM | POA: Diagnosis not present

## 2024-04-16 DIAGNOSIS — M25661 Stiffness of right knee, not elsewhere classified: Secondary | ICD-10-CM | POA: Diagnosis not present

## 2024-04-18 ENCOUNTER — Encounter (HOSPITAL_BASED_OUTPATIENT_CLINIC_OR_DEPARTMENT_OTHER): Admitting: Internal Medicine

## 2024-04-18 DIAGNOSIS — I87311 Chronic venous hypertension (idiopathic) with ulcer of right lower extremity: Secondary | ICD-10-CM

## 2024-04-18 DIAGNOSIS — D0471 Carcinoma in situ of skin of right lower limb, including hip: Secondary | ICD-10-CM | POA: Diagnosis not present

## 2024-04-18 DIAGNOSIS — L97812 Non-pressure chronic ulcer of other part of right lower leg with fat layer exposed: Secondary | ICD-10-CM

## 2024-04-18 DIAGNOSIS — L97818 Non-pressure chronic ulcer of other part of right lower leg with other specified severity: Secondary | ICD-10-CM

## 2024-04-18 DIAGNOSIS — T8131XA Disruption of external operation (surgical) wound, not elsewhere classified, initial encounter: Secondary | ICD-10-CM | POA: Diagnosis not present

## 2024-04-20 DIAGNOSIS — M25661 Stiffness of right knee, not elsewhere classified: Secondary | ICD-10-CM | POA: Diagnosis not present

## 2024-04-20 DIAGNOSIS — M7631 Iliotibial band syndrome, right leg: Secondary | ICD-10-CM | POA: Diagnosis not present

## 2024-04-24 DIAGNOSIS — M25661 Stiffness of right knee, not elsewhere classified: Secondary | ICD-10-CM | POA: Diagnosis not present

## 2024-04-24 DIAGNOSIS — M7631 Iliotibial band syndrome, right leg: Secondary | ICD-10-CM | POA: Diagnosis not present

## 2024-04-25 ENCOUNTER — Encounter (HOSPITAL_BASED_OUTPATIENT_CLINIC_OR_DEPARTMENT_OTHER): Attending: Internal Medicine | Admitting: Internal Medicine

## 2024-04-25 DIAGNOSIS — D0471 Carcinoma in situ of skin of right lower limb, including hip: Secondary | ICD-10-CM | POA: Diagnosis not present

## 2024-04-25 DIAGNOSIS — L97818 Non-pressure chronic ulcer of other part of right lower leg with other specified severity: Secondary | ICD-10-CM | POA: Diagnosis not present

## 2024-04-25 DIAGNOSIS — T8131XA Disruption of external operation (surgical) wound, not elsewhere classified, initial encounter: Secondary | ICD-10-CM | POA: Insufficient documentation

## 2024-04-25 DIAGNOSIS — X58XXXA Exposure to other specified factors, initial encounter: Secondary | ICD-10-CM | POA: Diagnosis not present

## 2024-04-25 DIAGNOSIS — L97812 Non-pressure chronic ulcer of other part of right lower leg with fat layer exposed: Secondary | ICD-10-CM | POA: Insufficient documentation

## 2024-04-25 DIAGNOSIS — I87311 Chronic venous hypertension (idiopathic) with ulcer of right lower extremity: Secondary | ICD-10-CM | POA: Insufficient documentation

## 2024-04-26 DIAGNOSIS — M25661 Stiffness of right knee, not elsewhere classified: Secondary | ICD-10-CM | POA: Diagnosis not present

## 2024-04-26 DIAGNOSIS — M7631 Iliotibial band syndrome, right leg: Secondary | ICD-10-CM | POA: Diagnosis not present

## 2024-04-30 DIAGNOSIS — M7631 Iliotibial band syndrome, right leg: Secondary | ICD-10-CM | POA: Diagnosis not present

## 2024-04-30 DIAGNOSIS — M25661 Stiffness of right knee, not elsewhere classified: Secondary | ICD-10-CM | POA: Diagnosis not present

## 2024-05-01 ENCOUNTER — Encounter (HOSPITAL_BASED_OUTPATIENT_CLINIC_OR_DEPARTMENT_OTHER): Payer: Self-pay

## 2024-05-01 ENCOUNTER — Encounter (HOSPITAL_BASED_OUTPATIENT_CLINIC_OR_DEPARTMENT_OTHER): Admitting: Internal Medicine

## 2024-05-01 DIAGNOSIS — D0471 Carcinoma in situ of skin of right lower limb, including hip: Secondary | ICD-10-CM | POA: Diagnosis not present

## 2024-05-01 DIAGNOSIS — L97812 Non-pressure chronic ulcer of other part of right lower leg with fat layer exposed: Secondary | ICD-10-CM

## 2024-05-01 DIAGNOSIS — I87311 Chronic venous hypertension (idiopathic) with ulcer of right lower extremity: Secondary | ICD-10-CM | POA: Diagnosis not present

## 2024-05-01 DIAGNOSIS — L97818 Non-pressure chronic ulcer of other part of right lower leg with other specified severity: Secondary | ICD-10-CM | POA: Diagnosis not present

## 2024-05-01 DIAGNOSIS — T8131XA Disruption of external operation (surgical) wound, not elsewhere classified, initial encounter: Secondary | ICD-10-CM | POA: Diagnosis not present

## 2024-05-02 ENCOUNTER — Encounter (HOSPITAL_BASED_OUTPATIENT_CLINIC_OR_DEPARTMENT_OTHER): Payer: Self-pay | Admitting: Cardiology

## 2024-05-02 ENCOUNTER — Ambulatory Visit (INDEPENDENT_AMBULATORY_CARE_PROVIDER_SITE_OTHER): Admitting: Cardiology

## 2024-05-02 VITALS — BP 122/66 | HR 71 | Ht 65.0 in | Wt 194.7 lb

## 2024-05-02 DIAGNOSIS — D6869 Other thrombophilia: Secondary | ICD-10-CM | POA: Diagnosis not present

## 2024-05-02 DIAGNOSIS — Z7901 Long term (current) use of anticoagulants: Secondary | ICD-10-CM

## 2024-05-02 DIAGNOSIS — I10 Essential (primary) hypertension: Secondary | ICD-10-CM | POA: Diagnosis not present

## 2024-05-02 DIAGNOSIS — I4821 Permanent atrial fibrillation: Secondary | ICD-10-CM

## 2024-05-02 DIAGNOSIS — I5032 Chronic diastolic (congestive) heart failure: Secondary | ICD-10-CM

## 2024-05-02 NOTE — Progress Notes (Signed)
  Cardiology Office Note:  .   Date:  05/02/2024  ID:  Sierra [name], Sierra [name] 29-Jun-1945, MRN [id number] PCP: Charlott Dorn LABOR, MD  [facility] Providers Cardiologist:  Shelda Bruckner, MD {  History of Present Illness: Sierra [name] is a 78 y.o. female with PMH paroxysmal atrial fibrillation, hypertension, chronic diastolic heart failure. She is a former patient of Dr. Dann and established care with me on 09/14/23.  Today: Saw Reche Finder 12/26/23, was euvolemic at that time, stable echo. Today she is feeling that things are stable. Right leg is wrapped today after recent wound treatment, feels this is healing well. Swelling has been well controlled. She elevates her legs regularly.   ROS: Denies chest pain, shortness of breath at rest or with normal exertion. No PND, orthopnea, or unexpected weight gain. No syncope or palpitations. ROS otherwise negative except as noted.   Studies Reviewed: SABRA    EKG:       Physical Exam:   VS:  BP 122/66   Pulse 71   Ht 5' 5 (1.651 m)   Wt 194 lb 11.2 oz (88.3 kg)   SpO2 96%   BMI 32.40 kg/m    Wt Readings from Last 3 Encounters:  05/02/24 194 lb 11.2 oz (88.3 kg)  12/29/23 193 lb (87.5 kg)  12/26/23 193 lb (87.5 kg)    GEN: Well nourished, well developed in no acute distress HEENT: Normal, moist mucous membranes NECK: No JVD CARDIAC: irregularly irregular rhythm, normal S1 and S2, no rubs or gallops. No murmur. VASCULAR: Radial and DP pulses 2+ bilaterally. No carotid bruits RESPIRATORY:  Clear to auscultation without rales, wheezing or rhonchi  ABDOMEN: Soft, non-tender, non-distended MUSCULOSKELETAL:  Ambulates independently with rollator SKIN: Warm and dry, bilateral nonpitting trivial edema, R leg wrapped NEUROLOGIC:  Alert and oriented x 3. No focal neuro deficits noted. PSYCHIATRIC:  Normal affect    ASSESSMENT AND PLAN: .    LE edema Chronic diastolic heart failure -we have discussed LE edema, venous  insufficiency and recommendations for management. Specifically recommended compression stockings and elevation. Significant improvement with higher elevation in legs -continue torsemide  20 mg daily -she is on high dose beta blocker, which is typically not ideal for diastolic heart failure, but even with this her heart rate runs in the 90s -on valsartan  -we have discussed entresto, SGLT2i, and spironolactone  Permanent atrial fibrillation Secondary hypercoagulable state -CHA2DS2/VAS Stroke Risk Points=5 -on coumadin . Per Dr. Johnice notes, declined DOAC. Would readdress as generics become available. -rate controlled on metoprolol  succinate 100 mg BID  Hypertension -continue metoprolol  as above, torsemide  as above -continue valsartan  160 mg daily. Could consider changing to entresto at follow up based on cost  Dispo: 6 mos  Signed, Shelda Bruckner, MD   Shelda Bruckner, MD, PhD, [facility]  [facility]  [place]  Heart & Vascular at [facility] at [facility] [address] [place], [place] [address] [phone]

## 2024-05-02 NOTE — Patient Instructions (Addendum)
 Medication Instructions:  Your physician recommends that you continue on your current medications as directed. Please refer to the Current Medication list given to you today.  *If you need a refill on your cardiac medications before your next appointment, please call your pharmacy*  Lab Work: NONE  Testing/Procedures: NONE  Follow-Up: At [facility], you and your health needs are our priority.  As part of our continuing mission to provide you with exceptional heart care, we have created designated Provider Care Teams.  These Care Teams include your primary Cardiologist (physician) and Advanced Practice Providers (APPs -  Physician Assistants and Nurse Practitioners) who all work together to provide you with the care you need, when you need it.  We recommend signing up for the patient portal called MyChart.  Sign up information is provided on this After Visit Summary.  MyChart is used to connect with patients for Virtual Visits (Telemedicine).  Patients are able to view lab/test results, encounter notes, upcoming appointments, etc.  Non-urgent messages can be sent to your provider as well.   To learn more about what you can do with MyChart, go to [email or web address].    Your next appointment:   6 month(s)  The format for your next appointment:   In Person  Provider:   Shelda Bruckner MD, Reche ORN NP, or Rosaline RAMAN NP

## 2024-05-03 DIAGNOSIS — M25661 Stiffness of right knee, not elsewhere classified: Secondary | ICD-10-CM | POA: Diagnosis not present

## 2024-05-03 DIAGNOSIS — M7631 Iliotibial band syndrome, right leg: Secondary | ICD-10-CM | POA: Diagnosis not present

## 2024-05-04 DIAGNOSIS — L602 Onychogryphosis: Secondary | ICD-10-CM | POA: Diagnosis not present

## 2024-05-04 DIAGNOSIS — L84 Corns and callosities: Secondary | ICD-10-CM | POA: Diagnosis not present

## 2024-05-04 DIAGNOSIS — I70203 Unspecified atherosclerosis of native arteries of extremities, bilateral legs: Secondary | ICD-10-CM | POA: Diagnosis not present

## 2024-05-08 DIAGNOSIS — M25661 Stiffness of right knee, not elsewhere classified: Secondary | ICD-10-CM | POA: Diagnosis not present

## 2024-05-08 DIAGNOSIS — M7631 Iliotibial band syndrome, right leg: Secondary | ICD-10-CM | POA: Diagnosis not present

## 2024-05-09 ENCOUNTER — Encounter (HOSPITAL_BASED_OUTPATIENT_CLINIC_OR_DEPARTMENT_OTHER): Admitting: Internal Medicine

## 2024-05-09 DIAGNOSIS — L97818 Non-pressure chronic ulcer of other part of right lower leg with other specified severity: Secondary | ICD-10-CM | POA: Diagnosis not present

## 2024-05-09 DIAGNOSIS — T8131XA Disruption of external operation (surgical) wound, not elsewhere classified, initial encounter: Secondary | ICD-10-CM

## 2024-05-09 DIAGNOSIS — L97812 Non-pressure chronic ulcer of other part of right lower leg with fat layer exposed: Secondary | ICD-10-CM

## 2024-05-09 DIAGNOSIS — I87311 Chronic venous hypertension (idiopathic) with ulcer of right lower extremity: Secondary | ICD-10-CM | POA: Diagnosis not present

## 2024-05-09 DIAGNOSIS — D0471 Carcinoma in situ of skin of right lower limb, including hip: Secondary | ICD-10-CM | POA: Diagnosis not present

## 2024-05-11 DIAGNOSIS — M7631 Iliotibial band syndrome, right leg: Secondary | ICD-10-CM | POA: Diagnosis not present

## 2024-05-11 DIAGNOSIS — M25661 Stiffness of right knee, not elsewhere classified: Secondary | ICD-10-CM | POA: Diagnosis not present

## 2024-05-15 ENCOUNTER — Ambulatory Visit: Attending: Interventional Cardiology | Admitting: *Deleted

## 2024-05-15 DIAGNOSIS — Z5181 Encounter for therapeutic drug level monitoring: Secondary | ICD-10-CM | POA: Diagnosis not present

## 2024-05-15 DIAGNOSIS — I480 Paroxysmal atrial fibrillation: Secondary | ICD-10-CM | POA: Diagnosis not present

## 2024-05-15 LAB — POCT INR: POC INR: 2.8

## 2024-05-15 NOTE — Patient Instructions (Signed)
 Description   INR:2.8, Continue taking warfarin 1/2 tablet daily except 1 tablet on Mondays. Recheck INR in 6 weeks.   Call with any new medications, any new procedures, and with any concerns [phone] Procedure Fax [phone] or [phone]

## 2024-05-15 NOTE — Progress Notes (Signed)
 Lab Results  Component Value Date   INR 2.8 05/15/2024   INR 2.2 04/05/2024   INR 3.2 (A) 03/01/2024    Description   INR:2.8, Continue taking warfarin 1/2 tablet daily except 1 tablet on Mondays. Recheck INR in 6 weeks.   Call with any new medications, any new procedures, and with any concerns [phone] Procedure Fax [phone] or [phone]

## 2024-05-16 ENCOUNTER — Encounter (HOSPITAL_BASED_OUTPATIENT_CLINIC_OR_DEPARTMENT_OTHER): Admitting: Internal Medicine

## 2024-05-16 DIAGNOSIS — I87311 Chronic venous hypertension (idiopathic) with ulcer of right lower extremity: Secondary | ICD-10-CM | POA: Diagnosis not present

## 2024-05-16 DIAGNOSIS — T8131XA Disruption of external operation (surgical) wound, not elsewhere classified, initial encounter: Secondary | ICD-10-CM

## 2024-05-16 DIAGNOSIS — D0471 Carcinoma in situ of skin of right lower limb, including hip: Secondary | ICD-10-CM | POA: Diagnosis not present

## 2024-05-16 DIAGNOSIS — L97812 Non-pressure chronic ulcer of other part of right lower leg with fat layer exposed: Secondary | ICD-10-CM | POA: Diagnosis not present

## 2024-05-16 DIAGNOSIS — L97818 Non-pressure chronic ulcer of other part of right lower leg with other specified severity: Secondary | ICD-10-CM | POA: Diagnosis not present

## 2024-05-22 DIAGNOSIS — M7631 Iliotibial band syndrome, right leg: Secondary | ICD-10-CM | POA: Diagnosis not present

## 2024-05-22 DIAGNOSIS — M25661 Stiffness of right knee, not elsewhere classified: Secondary | ICD-10-CM | POA: Diagnosis not present

## 2024-05-24 ENCOUNTER — Encounter (HOSPITAL_BASED_OUTPATIENT_CLINIC_OR_DEPARTMENT_OTHER): Admitting: Internal Medicine

## 2024-05-24 DIAGNOSIS — T8131XA Disruption of external operation (surgical) wound, not elsewhere classified, initial encounter: Secondary | ICD-10-CM | POA: Insufficient documentation

## 2024-05-24 DIAGNOSIS — L97818 Non-pressure chronic ulcer of other part of right lower leg with other specified severity: Secondary | ICD-10-CM | POA: Diagnosis not present

## 2024-05-24 DIAGNOSIS — I87311 Chronic venous hypertension (idiopathic) with ulcer of right lower extremity: Secondary | ICD-10-CM | POA: Insufficient documentation

## 2024-05-24 DIAGNOSIS — D0471 Carcinoma in situ of skin of right lower limb, including hip: Secondary | ICD-10-CM | POA: Diagnosis not present

## 2024-05-24 DIAGNOSIS — L97812 Non-pressure chronic ulcer of other part of right lower leg with fat layer exposed: Secondary | ICD-10-CM | POA: Insufficient documentation

## 2024-05-30 ENCOUNTER — Encounter (HOSPITAL_BASED_OUTPATIENT_CLINIC_OR_DEPARTMENT_OTHER): Admitting: Internal Medicine

## 2024-05-30 DIAGNOSIS — L97812 Non-pressure chronic ulcer of other part of right lower leg with fat layer exposed: Secondary | ICD-10-CM

## 2024-05-30 DIAGNOSIS — T8131XA Disruption of external operation (surgical) wound, not elsewhere classified, initial encounter: Secondary | ICD-10-CM | POA: Diagnosis not present

## 2024-05-30 DIAGNOSIS — I87311 Chronic venous hypertension (idiopathic) with ulcer of right lower extremity: Secondary | ICD-10-CM | POA: Diagnosis not present

## 2024-06-01 DIAGNOSIS — M7631 Iliotibial band syndrome, right leg: Secondary | ICD-10-CM | POA: Diagnosis not present

## 2024-06-01 DIAGNOSIS — M25661 Stiffness of right knee, not elsewhere classified: Secondary | ICD-10-CM | POA: Diagnosis not present

## 2024-06-05 DIAGNOSIS — M25661 Stiffness of right knee, not elsewhere classified: Secondary | ICD-10-CM | POA: Diagnosis not present

## 2024-06-05 DIAGNOSIS — M7631 Iliotibial band syndrome, right leg: Secondary | ICD-10-CM | POA: Diagnosis not present

## 2024-06-06 ENCOUNTER — Encounter (HOSPITAL_BASED_OUTPATIENT_CLINIC_OR_DEPARTMENT_OTHER): Admitting: Internal Medicine

## 2024-06-06 DIAGNOSIS — L97812 Non-pressure chronic ulcer of other part of right lower leg with fat layer exposed: Secondary | ICD-10-CM | POA: Diagnosis not present

## 2024-06-06 DIAGNOSIS — T8131XA Disruption of external operation (surgical) wound, not elsewhere classified, initial encounter: Secondary | ICD-10-CM

## 2024-06-06 DIAGNOSIS — I87311 Chronic venous hypertension (idiopathic) with ulcer of right lower extremity: Secondary | ICD-10-CM

## 2024-06-12 ENCOUNTER — Encounter (HOSPITAL_BASED_OUTPATIENT_CLINIC_OR_DEPARTMENT_OTHER): Admitting: General Surgery

## 2024-06-26 ENCOUNTER — Other Ambulatory Visit (HOSPITAL_BASED_OUTPATIENT_CLINIC_OR_DEPARTMENT_OTHER): Payer: Self-pay

## 2024-06-26 ENCOUNTER — Ambulatory Visit: Admitting: Pharmacist

## 2024-06-26 DIAGNOSIS — I4891 Unspecified atrial fibrillation: Secondary | ICD-10-CM | POA: Diagnosis present

## 2024-06-26 DIAGNOSIS — I480 Paroxysmal atrial fibrillation: Secondary | ICD-10-CM | POA: Insufficient documentation

## 2024-06-26 DIAGNOSIS — Z5181 Encounter for therapeutic drug level monitoring: Secondary | ICD-10-CM | POA: Diagnosis present

## 2024-06-26 LAB — POCT INR: INR: 3.2 — AB (ref 2.0–3.0)

## 2024-06-26 NOTE — Patient Instructions (Signed)
 Description   INR:3.2, Hold dose today and then continue taking warfarin 1/2 tablet daily except 1 tablet on Mondays. Recheck INR in 4 weeks.   Call with any new medications, any new procedures, and with any concerns [phone] Procedure Fax [phone] or [phone]

## 2024-06-26 NOTE — Progress Notes (Signed)
 Description   INR:3.2, Hold dose today and then continue taking warfarin 1/2 tablet daily except 1 tablet on Mondays. Recheck INR in 4 weeks.   Call with any new medications, any new procedures, and with any concerns [phone] Procedure Fax [phone] or [phone]

## 2024-07-01 ENCOUNTER — Other Ambulatory Visit: Payer: Self-pay | Admitting: Cardiology

## 2024-07-01 DIAGNOSIS — I480 Paroxysmal atrial fibrillation: Secondary | ICD-10-CM

## 2024-07-24 ENCOUNTER — Ambulatory Visit

## 2024-08-02 ENCOUNTER — Ambulatory Visit

## 2024-10-22 ENCOUNTER — Ambulatory Visit (HOSPITAL_BASED_OUTPATIENT_CLINIC_OR_DEPARTMENT_OTHER): Admitting: Cardiology
